# Patient Record
Sex: Male | Born: 1955 | Race: White | Hispanic: No | Marital: Married | Smoking: Never smoker
Health system: Southern US, Community
[De-identification: ages and names within clinical notes are randomized; demographics above are authoritative.]

## PROBLEM LIST (undated history)

## (undated) DIAGNOSIS — M109 Gout, unspecified: Secondary | ICD-10-CM

## (undated) HISTORY — PX: KNEE ARTHROSCOPY: SUR90

---

## 2007-03-16 ENCOUNTER — Ambulatory Visit (HOSPITAL_COMMUNITY): Admission: RE | Admit: 2007-03-16 | Discharge: 2007-03-16 | Payer: Self-pay | Admitting: Orthopedic Surgery

## 2015-10-25 ENCOUNTER — Encounter (HOSPITAL_BASED_OUTPATIENT_CLINIC_OR_DEPARTMENT_OTHER): Payer: Self-pay | Admitting: *Deleted

## 2015-10-25 ENCOUNTER — Emergency Department (HOSPITAL_BASED_OUTPATIENT_CLINIC_OR_DEPARTMENT_OTHER)
Admission: EM | Admit: 2015-10-25 | Discharge: 2015-10-25 | Disposition: A | Discharge status: Home / Self Care | Payer: Worker's Compensation | Attending: Emergency Medicine | Admitting: Emergency Medicine

## 2015-10-25 ENCOUNTER — Emergency Department (HOSPITAL_BASED_OUTPATIENT_CLINIC_OR_DEPARTMENT_OTHER): Payer: Worker's Compensation

## 2015-10-25 DIAGNOSIS — Y990 Civilian activity done for income or pay: Secondary | ICD-10-CM | POA: Insufficient documentation

## 2015-10-25 DIAGNOSIS — W010XXA Fall on same level from slipping, tripping and stumbling without subsequent striking against object, initial encounter: Secondary | ICD-10-CM | POA: Insufficient documentation

## 2015-10-25 DIAGNOSIS — S0990XA Unspecified injury of head, initial encounter: Secondary | ICD-10-CM | POA: Insufficient documentation

## 2015-10-25 DIAGNOSIS — S0081XA Abrasion of other part of head, initial encounter: Secondary | ICD-10-CM | POA: Insufficient documentation

## 2015-10-25 DIAGNOSIS — M109 Gout, unspecified: Secondary | ICD-10-CM | POA: Diagnosis not present

## 2015-10-25 DIAGNOSIS — Y9289 Other specified places as the place of occurrence of the external cause: Secondary | ICD-10-CM | POA: Diagnosis not present

## 2015-10-25 DIAGNOSIS — Y9389 Activity, other specified: Secondary | ICD-10-CM | POA: Diagnosis not present

## 2015-10-25 DIAGNOSIS — S0083XA Contusion of other part of head, initial encounter: Secondary | ICD-10-CM

## 2015-10-25 DIAGNOSIS — S0993XA Unspecified injury of face, initial encounter: Secondary | ICD-10-CM | POA: Diagnosis present

## 2015-10-25 DIAGNOSIS — S70311A Abrasion, right thigh, initial encounter: Secondary | ICD-10-CM | POA: Diagnosis not present

## 2015-10-25 DIAGNOSIS — T148XXA Other injury of unspecified body region, initial encounter: Secondary | ICD-10-CM

## 2015-10-25 HISTORY — DX: Gout, unspecified: M10.9

## 2015-10-25 NOTE — ED Notes (Signed)
Patient transported to CT 

## 2015-10-25 NOTE — Discharge Instructions (Signed)

## 2015-10-25 NOTE — ED Provider Notes (Signed)
CSN: [id number]     Arrival date & time 10/25/15  [id number] History   First MD Initiated Contact with Patient 10/25/15 0935     Chief Complaint  Patient presents with  . Fall     (Consider location/radiation/quality/duration/timing/severity/associated sxs/prior Treatment) HPI Comments: Patient presents with head and face pain after a fall. He states that he was at work and tripped on something that was on the ground, falling over onto his left face. He states there is no loss of consciousness. He does have a worsening headache. He has pain around his left eye. He denies any vision changes. He denies any neck or back pain. He also has an abrasion and swelling to his right thigh. He states he was seen at his occupational health facility where they cleaned and bandaged the abrasion on his right thigh and sent him over here for possible imaging studies. He denies any nausea or vomiting. He denies any dizziness. There is no chest pain or shortness of breath. He denies any other injuries. He is not taking anything for the pain and he denies any for any pain medication. He is not on anticoagulants.  Patient is a 60 y.o. male presenting with fall.  Fall Pertinent negatives include no chest pain, no abdominal pain, no headaches and no shortness of breath.    Past Medical History  Diagnosis Date  . Gout    Past Surgical History  Procedure Laterality Date  . Knee arthroscopy     History reviewed. No pertinent family history. Social History  Substance Use Topics  . Smoking status: Never Smoker   . Smokeless tobacco: None  . Alcohol Use: No    Review of Systems  Constitutional: Negative for fever, chills, diaphoresis and fatigue.  HENT: Positive for facial swelling. Negative for congestion, rhinorrhea and sneezing.   Eyes: Negative.   Respiratory: Negative for cough, chest tightness and shortness of breath.   Cardiovascular: Negative for chest pain and leg swelling.  Gastrointestinal: Negative  for nausea, vomiting, abdominal pain, diarrhea and blood in stool.  Genitourinary: Negative for frequency, hematuria, flank pain and difficulty urinating.  Musculoskeletal: Positive for myalgias. Negative for back pain, arthralgias and neck pain.  Skin: Positive for wound. Negative for rash.  Neurological: Negative for dizziness, speech difficulty, weakness, numbness and headaches.      Allergies  Review of patient's allergies indicates no known allergies.  Home Medications   Prior to Admission medications   Medication Sig Start Date End Date Taking? Authorizing Provider  allopurinol (ZYLOPRIM) 300 MG tablet Take 300 mg by mouth daily.   Yes Historical Provider, MD   BP 150/89 mmHg  Pulse 83  Temp(Src) 98.2 F (36.8 C) (Oral)  Resp 18  Ht 6' (1.829 m)  Wt 264 lb (119.75 kg)  BMI 35.80 kg/m2  SpO2 100% Physical Exam  Constitutional: He is oriented to person, place, and time. He appears well-developed and well-nourished.  HENT:  Head: Normocephalic.  Positive periorbital swelling around the left eye. There is tenderness in the supraorbital and infraorbital area. There is no redness or evident injuries to the eye itself. Pupils are equal round reactive to light. Extraocular eye movements are intact. There is ecchymosis in the supraorbital area. There is no hemotympanum. Teeth appear intact.  Eyes: Pupils are equal, round, and reactive to light.  Neck: Normal range of motion. Neck supple.  There is no pain along the cervical thoracic or lumbosacral spine  Cardiovascular: Normal rate, regular rhythm and normal heart sounds.  Pulmonary/Chest: Effort normal and breath sounds normal. No respiratory distress. He has no wheezes. He has no rales. He exhibits no tenderness.  Abdominal: Soft. Bowel sounds are normal. There is no tenderness. There is no rebound and no guarding.  Musculoskeletal: Normal range of motion. He exhibits no edema.  There is a contusion with overlying abrasion to  the right mid thigh on the lateral aspect. There is no bony tenderness. There is no pain on range of motion the hip or knee. He is neurovascularly intact distally. He denies any pain with ambulation. There is no other pain on palpation or range of motion of his extremities.  Lymphadenopathy:    He has no cervical adenopathy.  Neurological: He is alert and oriented to person, place, and time.  Skin: Skin is warm and dry. No rash noted.  Psychiatric: He has a normal mood and affect.    ED Course  Procedures (including critical care time) Labs Review Labs Reviewed - No data to display  Imaging Review Ct Head Wo Contrast  10/25/2015  CLINICAL DATA:  60 year old male who fell today striking face on concrete floor. Left eye bruising, headache. Initial encounter. EXAM: CT HEAD WITHOUT CONTRAST CT MAXILLOFACIAL WITHOUT CONTRAST TECHNIQUE: Multidetector CT imaging of the head and maxillofacial structures were performed using the standard protocol without intravenous contrast. Multiplanar CT image reconstructions of the maxillofacial structures were also generated. COMPARISON:  Orbit radiographs 03/16/2007. FINDINGS: CT HEAD FINDINGS Tympanic cavities and mastoids are clear. Calvarium intact. Superior scalp soft tissues are within normal limits, left periorbital soft tissues described below. No midline shift, ventriculomegaly, mass effect, evidence of mass lesion, intracranial hemorrhage or evidence of cortically based acute infarction. Gray-white matter differentiation is within normal limits throughout the brain. CT MAXILLOFACIAL FINDINGS Negative visualized non contrast pharynx (probable retained secretions in the right vallecula), parapharyngeal spaces, retropharyngeal space, sublingual space, submandibular glands, and parotid glands. Right orbits soft tissues are normal. 10 mm thick and broad-based left periorbital hematoma. The underlying left globe is intact. No left intraorbital contusion identified.  Left orbital walls are intact. Scattered bilateral mild paranasal sinus mucosal thickening. There does appear to be a small volume of layering blood in the left maxillary sinus, but no left maxillary fracture identified. Mandible intact. No zygoma or skullbase fracture identified. The anterior inferior nasal septum has been eroded. No nasal bone fracture identified. Visible cervical spine appears intact. IMPRESSION: 1. Superficial left periorbital hematoma. Trace layering blood in the left maxillary sinus, but no orbit, maxilla, or other acute facial fracture identified. 2. Negative noncontrast CT appearance of the brain. 3. 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 Electronically Signed   By: Genevie Ann M.D.   On: 10/25/2015 10:24   Ct Maxillofacial Wo Cm  10/25/2015  CLINICAL DATA:  60 year old male who fell today striking face on concrete floor. Left eye bruising, headache. Initial encounter. EXAM: CT HEAD WITHOUT CONTRAST CT MAXILLOFACIAL WITHOUT CONTRAST TECHNIQUE: Multidetector CT imaging of the head and maxillofacial structures were performed using the standard protocol without intravenous contrast. Multiplanar CT image reconstructions of the maxillofacial structures were also generated. COMPARISON:  Orbit radiographs 03/16/2007. FINDINGS: CT HEAD FINDINGS Tympanic cavities and mastoids are clear. Calvarium intact. Superior scalp soft tissues are within normal limits, left periorbital soft tissues described below. No midline shift, ventriculomegaly, mass effect, evidence of mass lesion, intracranial hemorrhage or evidence of cortically based acute infarction. Gray-white matter differentiation is within normal limits throughout the brain. CT MAXILLOFACIAL FINDINGS Negative visualized non contrast  pharynx (probable retained secretions in the right vallecula), parapharyngeal spaces, retropharyngeal space, sublingual space, submandibular glands, and parotid glands. Right orbits soft tissues are  normal. 10 mm thick and broad-based left periorbital hematoma. The underlying left globe is intact. No left intraorbital contusion identified. Left orbital walls are intact. Scattered bilateral mild paranasal sinus mucosal thickening. There does appear to be a small volume of layering blood in the left maxillary sinus, but no left maxillary fracture identified. Mandible intact. No zygoma or skullbase fracture identified. The anterior inferior nasal septum has been eroded. No nasal bone fracture identified. Visible cervical spine appears intact. IMPRESSION: 1. Superficial left periorbital hematoma. Trace layering blood in the left maxillary sinus, but no orbit, maxilla, or other acute facial fracture identified. 2. Negative noncontrast CT appearance of the brain. 3. 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 Electronically Signed   By: Genevie Ann M.D.   On: 10/25/2015 10:24   I have personally reviewed and evaluated these images and lab results as part of my medical decision-making.   EKG Interpretation None      MDM   Final diagnoses:  Facial contusion, initial encounter  Abrasion    Patient has evidence of a left facial contusion. There is no evidence of underlying fracture. No evidence of intracranial hemorrhage. He doesn't have any spinal tenderness. He doesn't have any evident eye injury. He denies any visual changes. His tetanus shot is up-to-date. He was discharged home in good condition. He was advised in symptomatic care. He was advised to follow-up with his PCP or return here as needed for any worsening symptoms.    Malvin Johns, MD 10/25/15 1034

## 2015-10-25 NOTE — ED Notes (Signed)
Pt c/o fall from standing x 1 hr ago, injuring left side of face , left thigh ,and l eft knee.

## 2015-10-25 NOTE — ED Notes (Signed)
MD at bedside. 

## 2015-12-06 DIAGNOSIS — Z1211 Encounter for screening for malignant neoplasm of colon: Secondary | ICD-10-CM | POA: Diagnosis not present

## 2015-12-06 DIAGNOSIS — Z1212 Encounter for screening for malignant neoplasm of rectum: Secondary | ICD-10-CM | POA: Diagnosis not present

## 2016-02-14 DIAGNOSIS — H15109 Unspecified episcleritis, unspecified eye: Secondary | ICD-10-CM | POA: Diagnosis not present

## 2016-02-14 DIAGNOSIS — Z1589 Genetic susceptibility to other disease: Secondary | ICD-10-CM | POA: Diagnosis not present

## 2016-02-14 DIAGNOSIS — H169 Unspecified keratitis: Secondary | ICD-10-CM | POA: Diagnosis not present

## 2016-10-25 DIAGNOSIS — Z125 Encounter for screening for malignant neoplasm of prostate: Secondary | ICD-10-CM | POA: Diagnosis not present

## 2016-10-25 DIAGNOSIS — Z0000 Encounter for general adult medical examination without abnormal findings: Secondary | ICD-10-CM | POA: Diagnosis not present

## 2016-10-31 DIAGNOSIS — Z1212 Encounter for screening for malignant neoplasm of rectum: Secondary | ICD-10-CM | POA: Diagnosis not present

## 2016-10-31 DIAGNOSIS — Z23 Encounter for immunization: Secondary | ICD-10-CM | POA: Diagnosis not present

## 2016-10-31 DIAGNOSIS — Z0001 Encounter for general adult medical examination with abnormal findings: Secondary | ICD-10-CM | POA: Diagnosis not present

## 2016-10-31 DIAGNOSIS — D485 Neoplasm of uncertain behavior of skin: Secondary | ICD-10-CM | POA: Diagnosis not present

## 2016-10-31 DIAGNOSIS — J309 Allergic rhinitis, unspecified: Secondary | ICD-10-CM | POA: Diagnosis not present

## 2016-10-31 DIAGNOSIS — M109 Gout, unspecified: Secondary | ICD-10-CM | POA: Diagnosis not present

## 2016-10-31 DIAGNOSIS — Z833 Family history of diabetes mellitus: Secondary | ICD-10-CM | POA: Diagnosis not present

## 2016-11-15 DIAGNOSIS — L820 Inflamed seborrheic keratosis: Secondary | ICD-10-CM | POA: Diagnosis not present

## 2016-11-15 DIAGNOSIS — I781 Nevus, non-neoplastic: Secondary | ICD-10-CM | POA: Diagnosis not present

## 2017-02-27 DIAGNOSIS — H01024 Squamous blepharitis left upper eyelid: Secondary | ICD-10-CM | POA: Diagnosis not present

## 2017-02-27 DIAGNOSIS — H01022 Squamous blepharitis right lower eyelid: Secondary | ICD-10-CM | POA: Diagnosis not present

## 2017-02-27 DIAGNOSIS — H01021 Squamous blepharitis right upper eyelid: Secondary | ICD-10-CM | POA: Diagnosis not present

## 2017-02-27 DIAGNOSIS — H01025 Squamous blepharitis left lower eyelid: Secondary | ICD-10-CM | POA: Diagnosis not present

## 2017-04-24 DIAGNOSIS — H18892 Other specified disorders of cornea, left eye: Secondary | ICD-10-CM | POA: Diagnosis not present

## 2017-09-16 DIAGNOSIS — H01024 Squamous blepharitis left upper eyelid: Secondary | ICD-10-CM | POA: Diagnosis not present

## 2017-09-16 DIAGNOSIS — H01022 Squamous blepharitis right lower eyelid: Secondary | ICD-10-CM | POA: Diagnosis not present

## 2017-09-16 DIAGNOSIS — H01021 Squamous blepharitis right upper eyelid: Secondary | ICD-10-CM | POA: Diagnosis not present

## 2017-09-16 DIAGNOSIS — H01025 Squamous blepharitis left lower eyelid: Secondary | ICD-10-CM | POA: Diagnosis not present

## 2017-11-11 DIAGNOSIS — Z0000 Encounter for general adult medical examination without abnormal findings: Secondary | ICD-10-CM | POA: Diagnosis not present

## 2017-11-11 DIAGNOSIS — Z125 Encounter for screening for malignant neoplasm of prostate: Secondary | ICD-10-CM | POA: Diagnosis not present

## 2017-11-13 DIAGNOSIS — R0989 Other specified symptoms and signs involving the circulatory and respiratory systems: Secondary | ICD-10-CM | POA: Diagnosis not present

## 2017-11-13 DIAGNOSIS — J309 Allergic rhinitis, unspecified: Secondary | ICD-10-CM | POA: Diagnosis not present

## 2017-11-13 DIAGNOSIS — M109 Gout, unspecified: Secondary | ICD-10-CM | POA: Diagnosis not present

## 2017-11-13 DIAGNOSIS — Z8781 Personal history of (healed) traumatic fracture: Secondary | ICD-10-CM | POA: Diagnosis not present

## 2017-11-13 DIAGNOSIS — Z0001 Encounter for general adult medical examination with abnormal findings: Secondary | ICD-10-CM | POA: Diagnosis not present

## 2017-11-13 DIAGNOSIS — Z1212 Encounter for screening for malignant neoplasm of rectum: Secondary | ICD-10-CM | POA: Diagnosis not present

## 2017-11-15 ENCOUNTER — Other Ambulatory Visit: Payer: Self-pay | Admitting: Internal Medicine

## 2017-11-22 ENCOUNTER — Other Ambulatory Visit: Payer: Self-pay | Admitting: Internal Medicine

## 2017-11-27 ENCOUNTER — Other Ambulatory Visit: Payer: Self-pay | Admitting: Internal Medicine

## 2017-11-29 ENCOUNTER — Other Ambulatory Visit: Payer: Self-pay | Admitting: Rheumatology

## 2017-11-29 DIAGNOSIS — E7800 Pure hypercholesterolemia, unspecified: Secondary | ICD-10-CM

## 2017-12-26 ENCOUNTER — Ambulatory Visit: Payer: Self-pay | Admitting: Cardiology

## 2018-01-09 ENCOUNTER — Ambulatory Visit: Payer: Self-pay | Admitting: Cardiology

## 2018-01-14 DIAGNOSIS — Z6841 Body Mass Index (BMI) 40.0 and over, adult: Secondary | ICD-10-CM | POA: Diagnosis not present

## 2018-02-10 DIAGNOSIS — M25562 Pain in left knee: Secondary | ICD-10-CM | POA: Diagnosis not present

## 2018-02-10 DIAGNOSIS — M1711 Unilateral primary osteoarthritis, right knee: Secondary | ICD-10-CM | POA: Diagnosis not present

## 2018-02-10 DIAGNOSIS — M1712 Unilateral primary osteoarthritis, left knee: Secondary | ICD-10-CM | POA: Diagnosis not present

## 2018-03-18 DIAGNOSIS — M1712 Unilateral primary osteoarthritis, left knee: Secondary | ICD-10-CM | POA: Diagnosis not present

## 2018-03-26 DIAGNOSIS — M1712 Unilateral primary osteoarthritis, left knee: Secondary | ICD-10-CM | POA: Diagnosis not present

## 2018-03-31 DIAGNOSIS — M1712 Unilateral primary osteoarthritis, left knee: Secondary | ICD-10-CM | POA: Diagnosis not present

## 2018-05-12 DIAGNOSIS — M1712 Unilateral primary osteoarthritis, left knee: Secondary | ICD-10-CM | POA: Diagnosis not present

## 2018-06-23 DIAGNOSIS — M1712 Unilateral primary osteoarthritis, left knee: Secondary | ICD-10-CM | POA: Diagnosis not present

## 2018-09-01 DIAGNOSIS — M25562 Pain in left knee: Secondary | ICD-10-CM | POA: Diagnosis not present

## 2018-09-01 DIAGNOSIS — M1712 Unilateral primary osteoarthritis, left knee: Secondary | ICD-10-CM | POA: Diagnosis not present

## 2018-09-05 DIAGNOSIS — M25562 Pain in left knee: Secondary | ICD-10-CM | POA: Diagnosis not present

## 2018-09-16 DIAGNOSIS — M25562 Pain in left knee: Secondary | ICD-10-CM | POA: Diagnosis not present

## 2018-09-29 DIAGNOSIS — H5213 Myopia, bilateral: Secondary | ICD-10-CM | POA: Diagnosis not present

## 2018-09-29 DIAGNOSIS — H52212 Irregular astigmatism, left eye: Secondary | ICD-10-CM | POA: Diagnosis not present

## 2018-09-29 DIAGNOSIS — H2513 Age-related nuclear cataract, bilateral: Secondary | ICD-10-CM | POA: Diagnosis not present

## 2018-09-29 DIAGNOSIS — H01003 Unspecified blepharitis right eye, unspecified eyelid: Secondary | ICD-10-CM | POA: Diagnosis not present

## 2019-03-30 DIAGNOSIS — H52212 Irregular astigmatism, left eye: Secondary | ICD-10-CM | POA: Diagnosis not present

## 2019-03-30 DIAGNOSIS — H179 Unspecified corneal scar and opacity: Secondary | ICD-10-CM | POA: Diagnosis not present

## 2019-03-30 DIAGNOSIS — H1840 Unspecified corneal degeneration: Secondary | ICD-10-CM | POA: Diagnosis not present

## 2019-03-30 DIAGNOSIS — H5213 Myopia, bilateral: Secondary | ICD-10-CM | POA: Diagnosis not present

## 2019-03-30 DIAGNOSIS — H16422 Pannus (corneal), left eye: Secondary | ICD-10-CM | POA: Diagnosis not present

## 2019-04-06 DIAGNOSIS — Z0000 Encounter for general adult medical examination without abnormal findings: Secondary | ICD-10-CM | POA: Diagnosis not present

## 2019-04-06 DIAGNOSIS — R7303 Prediabetes: Secondary | ICD-10-CM | POA: Diagnosis not present

## 2019-04-06 DIAGNOSIS — Z125 Encounter for screening for malignant neoplasm of prostate: Secondary | ICD-10-CM | POA: Diagnosis not present

## 2019-04-06 DIAGNOSIS — M109 Gout, unspecified: Secondary | ICD-10-CM | POA: Diagnosis not present

## 2019-07-05 HISTORY — DX: Other disorders of bilirubin metabolism: E80.6

## 2019-07-23 ENCOUNTER — Inpatient Hospital Stay (HOSPITAL_COMMUNITY)
Admission: EM | Admit: 2019-07-23 | Discharge: 2019-07-29 | DRG: 374 | Disposition: A | Discharge status: Home / Self Care | Payer: BC Managed Care – PPO | Attending: Internal Medicine | Admitting: Internal Medicine

## 2019-07-23 ENCOUNTER — Inpatient Hospital Stay (HOSPITAL_COMMUNITY): Payer: BC Managed Care – PPO

## 2019-07-23 ENCOUNTER — Encounter (HOSPITAL_COMMUNITY): Payer: Self-pay | Admitting: Radiology

## 2019-07-23 ENCOUNTER — Emergency Department (HOSPITAL_COMMUNITY): Payer: BC Managed Care – PPO

## 2019-07-23 DIAGNOSIS — C787 Secondary malignant neoplasm of liver and intrahepatic bile duct: Secondary | ICD-10-CM

## 2019-07-23 DIAGNOSIS — R1010 Upper abdominal pain, unspecified: Secondary | ICD-10-CM | POA: Diagnosis not present

## 2019-07-23 DIAGNOSIS — K8689 Other specified diseases of pancreas: Secondary | ICD-10-CM | POA: Diagnosis not present

## 2019-07-23 DIAGNOSIS — R160 Hepatomegaly, not elsewhere classified: Secondary | ICD-10-CM | POA: Diagnosis not present

## 2019-07-23 DIAGNOSIS — R1011 Right upper quadrant pain: Secondary | ICD-10-CM | POA: Diagnosis not present

## 2019-07-23 DIAGNOSIS — E669 Obesity, unspecified: Secondary | ICD-10-CM | POA: Diagnosis not present

## 2019-07-23 DIAGNOSIS — K3189 Other diseases of stomach and duodenum: Secondary | ICD-10-CM | POA: Diagnosis not present

## 2019-07-23 DIAGNOSIS — R933 Abnormal findings on diagnostic imaging of other parts of digestive tract: Secondary | ICD-10-CM | POA: Diagnosis not present

## 2019-07-23 DIAGNOSIS — R7989 Other specified abnormal findings of blood chemistry: Secondary | ICD-10-CM

## 2019-07-23 DIAGNOSIS — Z833 Family history of diabetes mellitus: Secondary | ICD-10-CM | POA: Diagnosis not present

## 2019-07-23 DIAGNOSIS — C7989 Secondary malignant neoplasm of other specified sites: Secondary | ICD-10-CM | POA: Diagnosis not present

## 2019-07-23 DIAGNOSIS — R17 Unspecified jaundice: Secondary | ICD-10-CM | POA: Diagnosis not present

## 2019-07-23 DIAGNOSIS — R748 Abnormal levels of other serum enzymes: Secondary | ICD-10-CM | POA: Diagnosis not present

## 2019-07-23 DIAGNOSIS — C170 Malignant neoplasm of duodenum: Secondary | ICD-10-CM | POA: Diagnosis not present

## 2019-07-23 DIAGNOSIS — C259 Malignant neoplasm of pancreas, unspecified: Secondary | ICD-10-CM

## 2019-07-23 DIAGNOSIS — Z20828 Contact with and (suspected) exposure to other viral communicable diseases: Secondary | ICD-10-CM | POA: Diagnosis present

## 2019-07-23 DIAGNOSIS — E222 Syndrome of inappropriate secretion of antidiuretic hormone: Secondary | ICD-10-CM | POA: Diagnosis present

## 2019-07-23 DIAGNOSIS — Z79899 Other long term (current) drug therapy: Secondary | ICD-10-CM

## 2019-07-23 DIAGNOSIS — K831 Obstruction of bile duct: Secondary | ICD-10-CM

## 2019-07-23 DIAGNOSIS — K649 Unspecified hemorrhoids: Secondary | ICD-10-CM | POA: Diagnosis present

## 2019-07-23 DIAGNOSIS — K769 Liver disease, unspecified: Secondary | ICD-10-CM | POA: Diagnosis not present

## 2019-07-23 DIAGNOSIS — R634 Abnormal weight loss: Secondary | ICD-10-CM

## 2019-07-23 DIAGNOSIS — K838 Other specified diseases of biliary tract: Secondary | ICD-10-CM | POA: Diagnosis not present

## 2019-07-23 DIAGNOSIS — G4700 Insomnia, unspecified: Secondary | ICD-10-CM | POA: Diagnosis present

## 2019-07-23 DIAGNOSIS — R109 Unspecified abdominal pain: Secondary | ICD-10-CM | POA: Diagnosis not present

## 2019-07-23 DIAGNOSIS — E871 Hypo-osmolality and hyponatremia: Secondary | ICD-10-CM

## 2019-07-23 DIAGNOSIS — R918 Other nonspecific abnormal finding of lung field: Secondary | ICD-10-CM | POA: Diagnosis not present

## 2019-07-23 DIAGNOSIS — Z8739 Personal history of other diseases of the musculoskeletal system and connective tissue: Secondary | ICD-10-CM

## 2019-07-23 DIAGNOSIS — Z6835 Body mass index (BMI) 35.0-35.9, adult: Secondary | ICD-10-CM | POA: Diagnosis not present

## 2019-07-23 DIAGNOSIS — R7303 Prediabetes: Secondary | ICD-10-CM | POA: Diagnosis not present

## 2019-07-23 DIAGNOSIS — M109 Gout, unspecified: Secondary | ICD-10-CM | POA: Diagnosis not present

## 2019-07-23 DIAGNOSIS — C801 Malignant (primary) neoplasm, unspecified: Secondary | ICD-10-CM | POA: Diagnosis not present

## 2019-07-23 DIAGNOSIS — C772 Secondary and unspecified malignant neoplasm of intra-abdominal lymph nodes: Secondary | ICD-10-CM | POA: Diagnosis not present

## 2019-07-23 DIAGNOSIS — C250 Malignant neoplasm of head of pancreas: Secondary | ICD-10-CM | POA: Diagnosis not present

## 2019-07-23 LAB — CBC WITH DIFFERENTIAL/PLATELET
Abs Immature Granulocytes: 0.02 K/uL (ref 0.00–0.07)
Basophils Absolute: 0.1 K/uL (ref 0.0–0.1)
Basophils Relative: 1 %
Eosinophils Absolute: 0.1 K/uL (ref 0.0–0.5)
Eosinophils Relative: 2 %
HCT: 39 % (ref 39.0–52.0)
Hemoglobin: 13.7 g/dL (ref 13.0–17.0)
Immature Granulocytes: 0 %
Lymphocytes Relative: 23 %
Lymphs Abs: 1.1 K/uL (ref 0.7–4.0)
MCH: 34.2 pg — ABNORMAL HIGH (ref 26.0–34.0)
MCHC: 35.1 g/dL (ref 30.0–36.0)
MCV: 97.3 fL (ref 80.0–100.0)
Monocytes Absolute: 0.4 K/uL (ref 0.1–1.0)
Monocytes Relative: 9 %
Neutro Abs: 3 K/uL (ref 1.7–7.7)
Neutrophils Relative %: 65 %
Platelets: 228 K/uL (ref 150–400)
RBC: 4.01 MIL/uL — ABNORMAL LOW (ref 4.22–5.81)
RDW: 12.1 % (ref 11.5–15.5)
WBC: 4.6 K/uL (ref 4.0–10.5)
nRBC: 0 % (ref 0.0–0.2)

## 2019-07-23 LAB — COMPREHENSIVE METABOLIC PANEL
ALT: 164 U/L — ABNORMAL HIGH (ref 0–44)
AST: 114 U/L — ABNORMAL HIGH (ref 15–41)
Albumin: 3.9 g/dL (ref 3.5–5.0)
Alkaline Phosphatase: 300 U/L — ABNORMAL HIGH (ref 38–126)
Anion gap: 10 (ref 5–15)
BUN: 7 mg/dL (ref 6–20)
CO2: 25 mmol/L (ref 22–32)
Calcium: 9.3 mg/dL (ref 8.9–10.3)
Chloride: 99 mmol/L (ref 98–111)
Creatinine, Ser: 0.72 mg/dL (ref 0.61–1.24)
GFR calc Af Amer: >60 mL/min (ref >60)
GFR calc non Af Amer: >60 mL/min (ref >60)
Glucose, Bld: 125 mg/dL — ABNORMAL HIGH (ref 70–99)
Potassium: 3.8 mmol/L (ref 3.5–5.1)
Sodium: 134 mmol/L — ABNORMAL LOW (ref 135–145)
Total Bilirubin: 13.6 mg/dL — ABNORMAL HIGH (ref 0.3–1.2)
Total Protein: 7.2 g/dL (ref 6.5–8.1)

## 2019-07-23 LAB — SEDIMENTATION RATE: Sed Rate: 20 mm/hr — ABNORMAL HIGH (ref 0–16)

## 2019-07-23 LAB — PROTIME-INR
INR: 1.1 (ref 0.8–1.2)
Prothrombin Time: 14.4 seconds (ref 11.4–15.2)

## 2019-07-23 LAB — LIPASE, BLOOD: Lipase: 297 U/L — ABNORMAL HIGH (ref 11–51)

## 2019-07-23 LAB — C-REACTIVE PROTEIN: CRP: 4.4 mg/dL — ABNORMAL HIGH (ref <1.0)

## 2019-07-23 LAB — HIV ANTIBODY (ROUTINE TESTING W REFLEX): HIV Screen 4th Generation wRfx: NONREACTIVE

## 2019-07-23 MED ORDER — ALLOPURINOL 300 MG PO TABS
300.0000 mg | ORAL_TABLET | Freq: Every day | ORAL | Status: DC
  Administered 2019-07-23 – 2019-07-28 (×6): 300 mg via ORAL
  Filled 2019-07-23 (×8): qty 1

## 2019-07-23 MED ORDER — ONDANSETRON HCL 4 MG PO TABS: 4.0000 mg | ORAL_TABLET | Freq: Four times a day (QID) | ORAL | Status: DC | PRN

## 2019-07-23 MED ORDER — ALUM & MAG HYDROXIDE-SIMETH 200-200-20 MG/5ML PO SUSP
30.0000 mL | ORAL | Status: DC | PRN
  Administered 2019-07-23: 30 mL via ORAL
  Filled 2019-07-23: qty 30

## 2019-07-23 MED ORDER — IOHEXOL 300 MG/ML  SOLN
100.0000 mL | Freq: Once | INTRAMUSCULAR | Status: AC | PRN
  Administered 2019-07-23: 100 mL via INTRAVENOUS

## 2019-07-23 MED ORDER — ENOXAPARIN SODIUM 40 MG/0.4ML ~~LOC~~ SOLN: 40.0000 mg | SUBCUTANEOUS | Status: DC

## 2019-07-23 MED ORDER — ONDANSETRON HCL 4 MG/2ML IJ SOLN: 4.0000 mg | Freq: Four times a day (QID) | INTRAMUSCULAR | Status: DC | PRN

## 2019-07-23 MED ORDER — ALBUTEROL SULFATE (2.5 MG/3ML) 0.083% IN NEBU: 2.5000 mg | INHALATION_SOLUTION | Freq: Four times a day (QID) | RESPIRATORY_TRACT | Status: DC | PRN

## 2019-07-23 MED ORDER — SODIUM CHLORIDE 0.9% FLUSH
3.0000 mL | Freq: Two times a day (BID) | INTRAVENOUS | Status: DC
  Administered 2019-07-23 – 2019-07-27 (×6): 3 mL via INTRAVENOUS

## 2019-07-23 MED ORDER — ZOLPIDEM TARTRATE 5 MG PO TABS
5.0000 mg | ORAL_TABLET | Freq: Every evening | ORAL | Status: DC | PRN
  Administered 2019-07-23 – 2019-07-28 (×6): 5 mg via ORAL
  Filled 2019-07-23 (×6): qty 1

## 2019-07-23 MED ORDER — SODIUM CHLORIDE 0.9 % IV SOLN
INTRAVENOUS | Status: DC
  Administered 2019-07-23 – 2019-07-29 (×7): via INTRAVENOUS

## 2019-07-23 NOTE — Plan of Care (Signed)
  Problem: Pain Managment: Goal: General experience of comfort will improve Outcome: Progressing   Problem: Safety: Goal: Ability to remain free from injury will improve Outcome: Progressing   Problem: Skin Integrity: Goal: Risk for impaired skin integrity will decrease Outcome: Progressing   

## 2019-07-23 NOTE — ED Provider Notes (Signed)
[facility] EMERGENCY DEPARTMENT Provider Note   CSN: [id number] Arrival date & time: 07/23/19  1024     History   Chief Complaint Chief Complaint  Patient presents with  . Jaundice  . Abnormal labs    HPI Jeffrey Harris is a 63 y.o. male.     63 y.o male with a PMH of Gout presents to the ED with a chief complaint of upper abdominal discomfort x 1 week.  Patient reports a feeling of fullness for the past 3 weeks around his upper abdomen, he reports he noted that his skin along with his eyes were turning yellow therefore he seek an appointment with his PCP today.  Patient was seen at PCPs office today and was found to have LFTs remarkable for an AST of 112, and ALT of 215 and a total bili of 12.3. He reports no nausea, vomiting, fevers, no changes in appetite.  He does report some intentional weight loss, he was told he was borderline diabetic here.  He has not had any surgeries to his abdominal region.  Last bowel movement prior to arrival.  He does report drinking alcohol however only does so occasionally.He does report he has been taking Tylenol PM to help him sleep for the past 2 months, has now switched to melatonin.  He does not have any prior history of hepatitis.  No fevers, diarrhea or recent travel.   The history is provided by the patient.    Past Medical History:  Diagnosis Date  . Gout     There are no active problems to display for this patient.   Past Surgical History:  Procedure Laterality Date  . KNEE ARTHROSCOPY          Home Medications    Prior to Admission medications   Medication Sig Start Date End Date Taking? Authorizing Provider  allopurinol (ZYLOPRIM) 300 MG tablet Take 300 mg by mouth daily.    [provider]    Family History No family history on file.  Social History Social History   Tobacco Use  . Smoking status: Never Smoker  Substance Use Topics  . Alcohol use: No  . Drug use: No     Allergies    Patient has no known allergies.   Review of Systems Review of Systems  Constitutional: Negative for chills and fever.  HENT: Negative for ear pain and sore throat.   Eyes: Negative for pain and visual disturbance.  Respiratory: Negative for cough and shortness of breath.   Cardiovascular: Negative for chest pain and palpitations.  Gastrointestinal: Positive for abdominal distention and abdominal pain. Negative for blood in stool, nausea and vomiting.  Genitourinary: Negative for dysuria and hematuria.  Musculoskeletal: Negative for arthralgias and back pain.  Skin: Negative for color change and rash.  Neurological: Negative for seizures and syncope.  All other systems reviewed and are negative.    Physical Exam Updated Vital Signs BP (!) 143/81   Pulse 69   Temp 97.9 F (36.6 C) (Oral)   Resp 12   SpO2 100%   Physical Exam Vitals signs and nursing note reviewed.  Constitutional:      Appearance: He is well-developed.  HENT:     Head: Normocephalic and atraumatic.  Eyes:     General: Scleral icterus present.     Pupils: Pupils are equal, round, and reactive to light.  Neck:     Musculoskeletal: Normal range of motion.  Cardiovascular:     Rate and  Rhythm: Normal rate.     Heart sounds: Normal heart sounds.  Pulmonary:     Effort: Pulmonary effort is normal.     Breath sounds: Normal breath sounds. No wheezing.     Comments: Lung's are clear to auscultation without any wheezing or rales. Chest:     Chest wall: No tenderness.  Abdominal:     General: Bowel sounds are normal. There is no distension.     Palpations: Abdomen is soft.     Tenderness: There is no abdominal tenderness.     Comments: Abdomen is soft, nontender.  Mild distention, no hepatomegaly on my exam.  Musculoskeletal:        General: No tenderness or deformity.  Skin:    General: Skin is warm and dry.     Coloration: Skin is jaundiced.  Neurological:     Mental Status: He is alert and oriented  to person, place, and time.      ED Treatments / Results  Labs (all labs ordered are listed, but only abnormal results are displayed) Labs Reviewed  CBC WITH DIFFERENTIAL/PLATELET - Abnormal; Notable for the following components:      Result Value   RBC 4.01 (*)    MCH 34.2 (*)    All other components within normal limits  COMPREHENSIVE METABOLIC PANEL - Abnormal; Notable for the following components:   Sodium 134 (*)    Glucose, Bld 125 (*)    AST 114 (*)    ALT 164 (*)    Alkaline Phosphatase 300 (*)    Total Bilirubin 13.6 (*)    All other components within normal limits  LIPASE, BLOOD - Abnormal; Notable for the following components:   Lipase 297 (*)    All other components within normal limits  PROTIME-INR    EKG None  Radiology US Abdomen Limited Ruq  Result Date: 07/23/2019 CLINICAL DATA:  Abdominal discomfort and jaundice EXAM: ULTRASOUND ABDOMEN LIMITED RIGHT UPPER QUADRANT COMPARISON:  None. FINDINGS: Gallbladder: No gallstones or wall thickening visualized. No sonographic Murphy sign noted by sonographer. Common bile duct: Diameter: 9 mm Liver: Heterogeneous echotexture. Ill-defined 2.1 x 2.1 x 1.5 cm hypoechoic area in the right lobe and 1.7 x 1.6 x 1.5 cm hypoechoic area in the left lobe. Portal vein is patent on color Doppler imaging with normal direction of blood flow towards the liver. Other: None. IMPRESSION: Dilatation of the common bile duct. Heterogeneous appearance of the liver with two nonspecific ill-defined hypoechoic areas. No evidence of acute cholecystitis. Electronically Signed   By: Macy Mis M.D.   On: 07/23/2019 11:23    Procedures Procedures (including critical care time)  Medications Ordered in ED Medications - No data to display   Initial Impression / Assessment and Plan / ED Course  I have reviewed the triage vital signs and the nursing notes.  Pertinent labs & imaging results that were available during my care of the patient  were reviewed by me and considered in my medical decision making (see chart for details).       Patient with no past medical history presents to the ED with complaints of abdominal distention, he was seen by PCP this morning due to having uncomfortable feeling to his upper abdominal region.  PCPs office was remarkable for laboratory results which showed an AST of 112, and ALT of 215, a total bili of 12.30.  UA also revealed some bilirubin present on it.  He reports no recent travel, fevers or prior history of hepatitis.  He does drink occasionally, reports he has been in Tylenol PM for a month to help him with his sleep, recently switched to melatonin.  No prior surgical history to his abdomen.  No nausea, vomiting.  An ultrasound of his upper quadrant was obtained which showed dilation of the common bile duct, there was a presence of 2 hypoechoic lesions on the left and right lobe of the liver.  Labs will be recollected in hospital, I will also place a call for gastroenterology for further recommendations.  No signs of infection such as fevers, nausea, vomiting, blood in his stool.  Lower suspicion for infection process.  12:35 PM Spoke to [place] who will see patient in the ED for further recommendations.  1:31 PM oratory results were remarkable for a CBC without any leukocytosis, hemoglobin is unremarkable.  CMP remarkable for elevated LFTs, AST is 114, ALT is 164, total bili is 13.6, PT and INR within normal limits.  Lipase is elevated at 297.  Patient was evaluated by GI, plan is for him to go and have MRCP versus ERCP.  He will need admission into hospitalist service.  No further recommendations were given from [facility].  1:48 PM Spoke to Dr. Tamala Julian, who will admit patient for further management.  Portions of this note were generated with [employer]. Dictation errors may occur despite best attempts at proofreading.  Final Clinical Impressions(s) / ED Diagnoses   Final  diagnoses:  Jaundice  Elevated LFTs  Pain of upper abdomen    ED Discharge Orders    None       Janeece Fitting, PA-C 07/23/19 [place], DO 07/23/19 1355

## 2019-07-23 NOTE — Consult Note (Addendum)
[facility] Consult: 12:33 PM 07/23/2019  LOS: 0 days    Referring Provider: Dr Tyrone Nine  Primary Care Physician:  [name], MD Primary Gastroenterologist:  unassigned     Reason for Consultation: Jaundice, upper abdominal fullness.  Abnormal abdominal ultrasound   HPI: Jeffrey Harris is a 63 y.o. male.  Hx gout.   Negative Cologuard in 2017.  3 weeks of discomfort in the upper abdomen/epigastric region.  Additionally having altered bowel habits fluctuating between loose and formed.  No pale or bloody stools.  Previously had formed brown stools daily.  Pain is not severe per his assessment but it also interrupts his ability to sleep.  This is not associated with anorexia, nausea, vomiting.  He has dropped weight over 18 months is gone from 270 # to 236 #but this is intentional he has been watching what he eats and trying to lose weight.  Yesterday he noticed that the whites of his eyes had turned yellow.  He saw PMD today who obtained labs, listed below, and was sent to the ED for further evaluation of jaundice.   T bili 12.3.  Alkaline phosphatase 366.  AST/ALT 112/215. PT 14.4, INR 1.1.  Glucose 125. Ultrasound abdomen: No GB stones, sludge, wall thickening.  9 mm CBD.  Liver heterogeneous with 2.1 x 2.1 x 1.5 cm hyperechoic area in the right lobe, 1.7 x 1.6 x 1.5 cm hyperechoic echoic area left lobe.  Portal vein flow normal.  Patient was taking a Tylenol PM before sleep but recently switched to melatonin.  Has not had any alcoholic beverages for at least 6 weeks, he can consume a sixpack on weekends when he is hanging out at the pool.  Does not always or necessarily drink during the week.  Previously employed as a Production assistant, radio at [employer] but lost his job due to [employer].  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 No family history of pancreatic disease, colorectal cancer.  His mother had her gallbladder removed.    Past Medical History:  Diagnosis Date  . Gout     Past Surgical History:  Procedure Laterality Date  . KNEE ARTHROSCOPY      Prior to Admission medications   Medication Sig Start Date End Date Taking? Authorizing Provider  allopurinol (ZYLOPRIM) 300 MG tablet Take 300 mg by mouth daily.    [provider]    Scheduled Meds:  Infusions:  PRN Meds:    Allergies as of 07/23/2019  . (No Known Allergies)    No family history on file.  Social History   Socioeconomic History  . Marital status: Married    Spouse name: Not on file  . Number of children: Not on file  . Years of education: Not on file  . Highest education level: Not on file  Occupational History  . Not on file  Social Needs  . Financial resource strain: Not on file  . Food insecurity    Worry: Not on file  Inability: Not on file  . Transportation needs    Medical: Not on file    Non-medical: Not on file  Tobacco Use  . Smoking status: Never Smoker  Substance and Sexual Activity  . Alcohol use: No  . Drug use: No  . Sexual activity: Never  Lifestyle  . Physical activity    Days per week: Not on file    Minutes per session: Not on file  . Stress: Not on file  Relationships  . Social Herbalist on phone: Not on file    Gets together: Not on file    Attends religious service: Not on file    Active member of club or organization: Not on file    Attends meetings of clubs or organizations: Not on file    Relationship status: Not on file  . Intimate partner violence    Fear of current or ex partner: Not on file    Emotionally abused: Not on file    Physically abused: Not on file    Forced sexual activity: Not on file  Other Topics Concern  . Not on file  Social History Narrative  .  Not on file    REVIEW OF SYSTEMS: Constitutional: No weakness, no fatigue ENT:  No nose bleeds Pulm: No shortness of breath, no cough CV:  No palpitations, no LE edema.  No angina GU:  No hematuria, no frequency GI: See HPI Heme: Unusual or excessive bleeding or bruising. Transfusions: Never transfused in the past. Neuro:  No headaches, no peripheral tingling or numbness Derm:  No itching, no rash or sores.  Endocrine:  No sweats or chills.  No polyuria or dysuria Immunization: Not queried. Travel:  None beyond local counties in last few months.    PHYSICAL EXAM: Vital signs in last 24 hours: Vitals:   07/23/19 1220 07/23/19 1225  BP:    Pulse: 74 75  Resp: 11 12  Temp:    SpO2: 100% 98%   Wt Readings from Last 3 Encounters:  10/25/15 119.7 kg    General: Cooperative, pleasant, other than scleral icterus, he looks well. Head: No facial asymmetry or swelling.  No signs of head trauma. Eyes: Icteric sclera which is mild. Ears: Not hard of hearing Nose: No discharge or congestion Mouth: Moist, clear, pink mucosa.  Tongue midline.  Excellent dental health. Neck: No JVD, no masses, no thyromegaly. Lungs: No labored breathing, no cough.  Lungs clear to auscultation bilaterally. Heart: RRR.  No MRG.  S1, S2 present. Abdomen: Soft.  Moderate tenderness in the upper abdomen at the epigastrium and right upper quadrant fullness in the right upper quadrant without discrete liver edge.  Active bowel sounds.  No hernias, bruits. Rectal: Deferred Musc/Skeltl: No joint swelling, redness, gross deformity. Extremities: No CCE. Neurologic: Oriented x3.  Fully alert.  Excellent historian.  Moves all 4 limbs.  No limb weakness, no tremors. Skin: Patient has a lingering suntanned so it is hard to appreciate jaundice. Nodes: No cervical adenopathy. Psych: Cooperative, pleasant, calm.  Fluid speech.  Intake/Output from previous day: No intake/output data recorded. Intake/Output this  shift: No intake/output data recorded.  LAB RESULTS: No results for input(s): WBC, HGB, HCT, PLT in the last 72 hours. BMET No results found for: NA, K, CL, CO2, GLUCOSE, BUN, CREATININE, CALCIUM LFT No results for input(s): PROT, ALBUMIN, AST, ALT, ALKPHOS, BILITOT, BILIDIR, IBILI in the last 72 hours. PT/INR Lab Results  Component Value Date   INR 1.1 07/23/2019  Hepatitis Panel No results for input(s): HEPBSAG, HCVAB, HEPAIGM, HEPBIGM in the last 72 hours. C-Diff No components found for: CDIFF Lipase  No results found for: LIPASE  Drugs of Abuse  No results found for: LABOPIA, COCAINSCRNUR, LABBENZ, AMPHETMU, THCU, LABBARB   RADIOLOGY STUDIES: US Abdomen Limited Ruq  Result Date: 07/23/2019 CLINICAL DATA:  Abdominal discomfort and jaundice EXAM: ULTRASOUND ABDOMEN LIMITED RIGHT UPPER QUADRANT COMPARISON:  None. FINDINGS: Gallbladder: No gallstones or wall thickening visualized. No sonographic Murphy sign noted by sonographer. Common bile duct: Diameter: 9 mm Liver: Heterogeneous echotexture. Ill-defined 2.1 x 2.1 x 1.5 cm hypoechoic area in the right lobe and 1.7 x 1.6 x 1.5 cm hypoechoic area in the left lobe. Portal vein is patent on color Doppler imaging with normal direction of blood flow towards the liver. Other: None. IMPRESSION: Dilatation of the common bile duct. Heterogeneous appearance of the liver with two nonspecific ill-defined hypoechoic areas. No evidence of acute cholecystitis. Electronically Signed   By: Macy Mis M.D.   On: 07/23/2019 11:23      IMPRESSION:   *   New onset jaundice and right upper quadrant fullness. Dilated CBD at 9 mm and 2 indeterminate lesions in heterogeneous liver.     PLAN:     *   MRCP vs ERCP Will discuss with Dr. Loletha Carrow.  *   PT/INR.   Azucena Freed  07/23/2019, 12:33 PM Phone [phone]  I have reviewed the entire case in detail with the above APP and discussed the plan in detail.  Therefore, I agree with the  diagnoses recorded above. In addition,  I have personally interviewed and examined the patient and have personally reviewed any abdominal/pelvic CT scan images.  My additional thoughts are as follows:  I discussed with my PA later in the day and have just now evaluated the patient.  Given his right upper quadrant pain, obstructive appearing jaundice on ultrasound and weight loss, I am concerned for possible pancreatic malignancy.  CT abdomen and pelvis with oral and IV contrast has been ordered.  Will return after the result with further plans.   Nelida Meuse III Office:[phone]

## 2019-07-23 NOTE — ED Notes (Signed)
Pt ambulatory from lobby to room with no reported issues.

## 2019-07-23 NOTE — H&P (Signed)
History and Physical    Jeffrey Harris [id number] DOB: [birth year] DOA: 07/23/2019  Referring MD/NP/PA: Bennie Dallas, PA-C PCP: [name], MD  Patient coming from: home  Chief Complaint:   I have personally briefly reviewed patient's old medical records in [place]   HPI: Jeffrey Harris is a 63 y.o. male with medical history significant of gout.  He presents with complaints of right upper quadrant fullness over the last 3 weeks.  He describes it as an uncomfortable feeling that disturb his sleep.  Noted associated symptoms of loose stools and hemorrhoid. Denies having any nausea, vomiting, fever, shortness of breath, cough, or dysuria.  He has intentionally lost around 30 pounds from last year after being told that he was borderline diabetic.  He admits to drinking alcohol only occasionally and not on a daily basis.  He has used Tylenol PM to help him sleep for the last 2 months but he is not taking more than the recommended amount.  Over the last day he noticed that his eyes were turning yellow and asked when he went to his primary care provider to do lab work.  Lab work was noted to be abnormal and he was sent here to the emergency department for further evaluation.  ED Course: Upon admission into the emergency department patient was noted to have fairly stable vital signs.  Labs significant for lipase 297, alkaline phosphatase 300, AST 114, ALT 164, and total bilirubin 13.6.  Right upper quadrant ultrasound revealed  Dilatation of the CBD up to 29mm and 2 hypoechoic masses. GI was consulted.  Plan for possible MRCP/ERCP in a.m.  TR[facility] called to admit.  Review of Systems  Constitutional: Positive for weight loss. Negative for fever.  HENT: Negative for nosebleeds and sinus pain.   Eyes: Negative for blurred vision and photophobia.       Positive for jaundice denies  Respiratory: Negative for cough and shortness of breath.   Cardiovascular: Negative for chest pain and leg  swelling.  Gastrointestinal: Positive for abdominal pain and diarrhea. Negative for nausea and vomiting.  Genitourinary: Negative for dysuria and hematuria.  Musculoskeletal: Negative for falls and myalgias.  Skin: Negative for rash.  Neurological: Negative for focal weakness and loss of consciousness.  Psychiatric/Behavioral: The patient has insomnia.     Past Medical History:  Diagnosis Date  . Gout     Past Surgical History:  Procedure Laterality Date  . KNEE ARTHROSCOPY       reports that he has never smoked. He does not have any smokeless tobacco history on file. He reports that he does not drink alcohol or use drugs.  No Known Allergies  Family History  Problem Relation Age of Onset  . Diabetes Father     Prior to Admission medications   Medication Sig Start Date End Date Taking? Authorizing Provider  allopurinol (ZYLOPRIM) 300 MG tablet Take 300 mg by mouth daily.    [provider]    Physical Exam:  Constitutional: Obese male NAD, calm, comfortable Vitals:   07/23/19 1217 07/23/19 1220 07/23/19 1225 07/23/19 1230  BP: 137/82   (!) 143/81  Pulse:  74 75 69  Resp:  11 12 12   Temp:      TempSrc:      SpO2:  100% 98% 100%   Eyes: PERRL, scleral icterus present ENMT: Mucous membranes are moist. Posterior pharynx clear of any exudate or lesions.Normal dentition.  Neck: normal, supple, no masses, no thyromegaly Respiratory: clear to  auscultation bilaterally, no wheezing, no crackles. Normal respiratory effort. No accessory muscle use.  Cardiovascular: Regular rate and rhythm, no murmurs / rubs / gallops. No extremity edema. 2+ pedal pulses. No carotid bruits.  Abdomen: no tenderness, mildly distended, no masses palpated. No hepatosplenomegaly. Bowel sounds positive.  Musculoskeletal: no clubbing / cyanosis. No joint deformity upper and lower extremities. Good ROM, no contractures. Normal muscle tone.  Skin: no rashes, lesions, ulcers. No induration  Neurologic: CN 2-12 grossly intact. Sensation intact, DTR normal. Strength 5/5 in all 4.  Psychiatric: Normal judgment and insight. Alert and oriented x 3. Normal mood.     Labs on Admission: I have personally reviewed following labs and imaging studies  CBC: Recent Labs  Lab 07/23/19 1142  WBC 4.6  NEUTROABS 3.0  HGB 13.7  HCT 39.0  MCV 97.3  PLT [id number]   Basic Metabolic Panel: Recent Labs  Lab 07/23/19 1142  NA 134*  K 3.8  CL 99  CO2 25  GLUCOSE 125*  BUN 7  CREATININE 0.72  CALCIUM 9.3   GFR: CrCl cannot be calculated (Unknown ideal weight.). Liver Function Tests: Recent Labs  Lab 07/23/19 1142  AST 114*  ALT 164*  ALKPHOS 300*  BILITOT 13.6*  PROT 7.2  ALBUMIN 3.9   Recent Labs  Lab 07/23/19 1142  LIPASE 297*   No results for input(s): AMMONIA in the last 168 hours. Coagulation Profile: Recent Labs  Lab 07/23/19 1100  INR 1.1   Cardiac Enzymes: No results for input(s): CKTOTAL, CKMB, CKMBINDEX, TROPONINI in the last 168 hours. BNP (last 3 results) No results for input(s): PROBNP in the last 8760 hours. HbA1C: No results for input(s): HGBA1C in the last 72 hours. CBG: No results for input(s): GLUCAP in the last 168 hours. Lipid Profile: No results for input(s): CHOL, HDL, LDLCALC, TRIG, CHOLHDL, LDLDIRECT in the last 72 hours. Thyroid Function Tests: No results for input(s): TSH, T4TOTAL, FREET4, T3FREE, THYROIDAB in the last 72 hours. Anemia Panel: No results for input(s): VITAMINB12, FOLATE, FERRITIN, TIBC, IRON, RETICCTPCT in the last 72 hours. Urine analysis: No results found for: COLORURINE, APPEARANCEUR, LABSPEC, PHURINE, GLUCOSEU, HGBUR, BILIRUBINUR, KETONESUR, PROTEINUR, UROBILINOGEN, NITRITE, LEUKOCYTESUR Sepsis Labs: No results found for this or any previous visit (from the past 240 hour(s)).   Radiological Exams on Admission: US Abdomen Limited Ruq  Result Date: 07/23/2019 CLINICAL DATA:  Abdominal discomfort and jaundice  EXAM: ULTRASOUND ABDOMEN LIMITED RIGHT UPPER QUADRANT COMPARISON:  None. FINDINGS: Gallbladder: No gallstones or wall thickening visualized. No sonographic Murphy sign noted by sonographer. Common bile duct: Diameter: 9 mm Liver: Heterogeneous echotexture. Ill-defined 2.1 x 2.1 x 1.5 cm hypoechoic area in the right lobe and 1.7 x 1.6 x 1.5 cm hypoechoic area in the left lobe. Portal vein is patent on color Doppler imaging with normal direction of blood flow towards the liver. Other: None. IMPRESSION: Dilatation of the common bile duct. Heterogeneous appearance of the liver with two nonspecific ill-defined hypoechoic areas. No evidence of acute cholecystitis. Electronically Signed   By: Macy Mis M.D.   On: 07/23/2019 11:23     Assessment/Plan Hyperbilirubinemia and elevated liver enzymes: Acute.  Patient presents with complaints of jaundice and right upper quadrant abdominal pain.  Labs significant for lipase 297, alkaline phosphatase 300, AST 114, ALT 164, and total bilirubin 13.6.  Ultrasound imaging revealed dilated common bile duct with 2 nonspecific hypoechoic areas seen on the liver.  Question the possibility of underlying malignancy given reports weight loss although patient reports  are intentional. -Admit to MedSurg bed  -N.p.o. -Normal saline IV fluids at 75 mL/h -Recheck liver function studies in a.m. -Appreciate GI consultative services, will follow-up for further recommendations -Follow-up CT scan of the abdomen pelvis with contrast per GI -MRCP versus ERCP per GI  History of gout: Patient on allopurinol and denies any reports of acute flare. -Continue allopurinol  Obesity: BMI estimated at 35.7 kg/m  DVT prophylaxis: Lovenox Code Status: full Family Communication: No family present at bedside Disposition Plan: Likely discharge home once medically stable Consults called:GI Admission status: inpatient   Norval Morton MD [facility] Pager [phone]   If  7PM-7AM, please contact night-coverage [email or web address] Password [email or web address]  07/23/2019, 1:45 PM

## 2019-07-23 NOTE — ED Notes (Signed)
PA at bedside.

## 2019-07-23 NOTE — Progress Notes (Addendum)
Received patient from ED. Patient alert and oriented. Self ambulated from stretcher to bathroom and bed. Patient resting comfortable in bed, no complaints of pain. Call bell within reach. Will continue to monitor.

## 2019-07-23 NOTE — ED Triage Notes (Signed)
Pt arrives via POV from home with abnormal labs, jaundice. Per provider pt with elevated LFTS, bili, poss cholecystitis or ruleout malignacy, RUQ pain for the last 4-5 days. VSS. NAD at present.

## 2019-07-24 ENCOUNTER — Inpatient Hospital Stay (HOSPITAL_COMMUNITY): Payer: BC Managed Care – PPO

## 2019-07-24 DIAGNOSIS — K8689 Other specified diseases of pancreas: Secondary | ICD-10-CM

## 2019-07-24 DIAGNOSIS — R17 Unspecified jaundice: Secondary | ICD-10-CM

## 2019-07-24 DIAGNOSIS — C250 Malignant neoplasm of head of pancreas: Secondary | ICD-10-CM

## 2019-07-24 DIAGNOSIS — R160 Hepatomegaly, not elsewhere classified: Secondary | ICD-10-CM

## 2019-07-24 DIAGNOSIS — C787 Secondary malignant neoplasm of liver and intrahepatic bile duct: Secondary | ICD-10-CM

## 2019-07-24 DIAGNOSIS — C772 Secondary and unspecified malignant neoplasm of intra-abdominal lymph nodes: Secondary | ICD-10-CM

## 2019-07-24 LAB — COMPREHENSIVE METABOLIC PANEL
ALT: 139 U/L — ABNORMAL HIGH (ref 0–44)
AST: 95 U/L — ABNORMAL HIGH (ref 15–41)
Albumin: 3.3 g/dL — ABNORMAL LOW (ref 3.5–5.0)
Alkaline Phosphatase: 242 U/L — ABNORMAL HIGH (ref 38–126)
Anion gap: 10 (ref 5–15)
BUN: 6 mg/dL (ref 6–20)
CO2: 24 mmol/L (ref 22–32)
Calcium: 8.7 mg/dL — ABNORMAL LOW (ref 8.9–10.3)
Chloride: 99 mmol/L (ref 98–111)
Creatinine, Ser: 0.68 mg/dL (ref 0.61–1.24)
GFR calc Af Amer: >60 mL/min (ref >60)
GFR calc non Af Amer: >60 mL/min (ref >60)
Glucose, Bld: 131 mg/dL — ABNORMAL HIGH (ref 70–99)
Potassium: 4.2 mmol/L (ref 3.5–5.1)
Sodium: 133 mmol/L — ABNORMAL LOW (ref 135–145)
Total Bilirubin: 11.6 mg/dL — ABNORMAL HIGH (ref 0.3–1.2)
Total Protein: 6.5 g/dL (ref 6.5–8.1)

## 2019-07-24 LAB — CBC
HCT: 34.9 % — ABNORMAL LOW (ref 39.0–52.0)
Hemoglobin: 12.4 g/dL — ABNORMAL LOW (ref 13.0–17.0)
MCH: 34.3 pg — ABNORMAL HIGH (ref 26.0–34.0)
MCHC: 35.5 g/dL (ref 30.0–36.0)
MCV: 96.4 fL (ref 80.0–100.0)
Platelets: 197 K/uL (ref 150–400)
RBC: 3.62 MIL/uL — ABNORMAL LOW (ref 4.22–5.81)
RDW: 12.3 % (ref 11.5–15.5)
WBC: 4.9 K/uL (ref 4.0–10.5)
nRBC: 0 % (ref 0.0–0.2)

## 2019-07-24 LAB — SARS CORONAVIRUS 2 (TAT 6-24 HRS): SARS Coronavirus 2: NEGATIVE

## 2019-07-24 LAB — ABO/RH: ABO/RH(D): A NEG

## 2019-07-24 MED ORDER — IOHEXOL 300 MG/ML  SOLN
75.0000 mL | Freq: Once | INTRAMUSCULAR | Status: AC | PRN
  Administered 2019-07-24: 75 mL via INTRAVENOUS

## 2019-07-24 NOTE — H&P (View-Only) (Signed)
Daily Rounding Note  07/24/2019, 10:35 AM  LOS: 1 day   SUBJECTIVE:   Chief complaint:     Obstructive jaundice  He feels much the same today, with some dull right upper quadrant pain. He is not itching, no fever reported. His appetite has been good with no nausea or vomiting.   OBJECTIVE:         Vital signs in last 24 hours:    Temp:  [97.9 F (36.6 C)-98.3 F (36.8 C)] 98 F (36.7 C) (11/20 0611) Pulse Rate:  [62-75] 69 (11/20 0611) Resp:  [11-20] 20 (11/20 0611) BP: (121-156)/(68-84) 121/68 (11/20 0611) SpO2:  [98 %-100 %] 100 % (11/20 [id number]) Last BM Date: 07/23/19 There were no vitals filed for this visit. General: Well-appearing, affect seems down. Heart: Regular rate and rhythm, no appreciable murmur no peripheral edema Chest: Lungs clear to auscultation bilaterally Abdomen: Soft nondistended, mild RUQ tenderness as before Jaundice and icterus Mental status normal, no gross motor deficits. Ambulates back-and-forth to bathroom without difficulty  Intake/Output from previous day: 11/19 0701 - 11/20 0700 In: 847.6 [I.V.:847.6] Out: -   Intake/Output this shift: Total I/O In: 480 [P.O.:480] Out: -   Lab Results: Recent Labs    07/23/19 1142 07/24/19 0154  WBC 4.6 4.9  HGB 13.7 12.4*  HCT 39.0 34.9*  PLT 228 197   BMET Recent Labs    07/23/19 1142 07/24/19 0154  NA 134* 133*  K 3.8 4.2  CL 99 99  CO2 25 24  GLUCOSE 125* 131*  BUN 7 6  CREATININE 0.72 0.68  CALCIUM 9.3 8.7*   LFT Recent Labs    07/23/19 1142 07/24/19 0154  PROT 7.2 6.5  ALBUMIN 3.9 3.3*  AST 114* 95*  ALT 164* 139*  ALKPHOS 300* 242*  BILITOT 13.6* 11.6*   PT/INR Recent Labs    07/23/19 1100  LABPROT 14.4  INR 1.1   Hepatitis Panel No results for input(s): HEPBSAG, HCVAB, HEPAIGM, HEPBIGM in the last 72 hours.  Studies/Results: Ct Abdomen Pelvis W Contrast  Result Date: 07/24/2019 CLINICAL DATA:   Painless jaundice for 1 week. Unintentional weight loss over past 3 months. EXAM: CT ABDOMEN AND PELVIS WITH CONTRAST TECHNIQUE: Multidetector CT imaging of the abdomen and pelvis was performed using the standard protocol following bolus administration of intravenous contrast. CONTRAST:  160mL OMNIPAQUE IOHEXOL 300 MG/ML  SOLN COMPARISON:  None. FINDINGS: Lower Chest: No acute findings. Hepatobiliary: Multiple small hypovascular lesions are seen throughout the right and left lobes. Largest lesion is seen in segment 4A, measuring 2.1 x 1.5 cm on image 12/3. These are consistent with diffuse pulmonary metastases. The gallbladder is distended, however there are no signs of acute cholecystitis. Diffuse biliary ductal dilatation is seen to the level of the pancreatic head. Pancreas: Subtle ill-defined soft tissue prominence is seen in the pancreatic head which measures 2.8 x 2.6 cm, and is suspicious for pancreatic carcinoma. No evidence of pancreatic ductal dilatation or peripancreatic inflammatory changes. Spleen: Within normal limits in size and appearance. Adrenals/Urinary Tract: A 2.4 cm fat attenuation mass is seen in the left adrenal gland, consistent with a benign myelolipoma. The right adrenal gland and left kidney are normal appearance. Right-sided pelvic kidney is noted which is otherwise unremarkable in appearance. No evidence of renal mass or hydronephrosis. Unremarkable unopacified urinary bladder. Stomach/Bowel: No evidence of obstruction, inflammatory process or abnormal fluid collections. Normal appendix visualized. Vascular/Lymphatic: A 1.3 cm peripancreatic lymph node  is seen just posterior to the portal venous confluence on image 19/3. A 1.4 cm porta hepatis lymph node is seen on image 17/3. 8 mm lymph node is also seen adjacent to the GE junction on image 12/13. No other pathologically enlarged lymph nodes are identified. There is no evidence of vascular involvement of SMA, SMV, or portal vein.  Reproductive:  No mass or other significant abnormality. Other:  None. Musculoskeletal:  No suspicious bone lesions identified. IMPRESSION: 1. Diffuse biliary ductal dilatation, with approximately 2.8 cm soft tissue prominence in the pancreatic head, highly suspicious for primary pancreatic carcinoma. Recommend abdomen MRI and MRCP without and with contrast for further evaluation. 2. Diffuse liver metastases. 3. Mild peripancreatic and porta hepatis lymphadenopathy, suspicious for metastatic disease. 4. 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 Electronically Signed   By: Marlaine Hind M.D.   On: 07/24/2019 07:50   US Abdomen Limited Ruq  Result Date: 07/23/2019 CLINICAL DATA:  Abdominal discomfort and jaundice EXAM: ULTRASOUND ABDOMEN LIMITED RIGHT UPPER QUADRANT COMPARISON:  None. FINDINGS: Gallbladder: No gallstones or wall thickening visualized. No sonographic Murphy sign noted by sonographer. Common bile duct: Diameter: 9 mm Liver: Heterogeneous echotexture. Ill-defined 2.1 x 2.1 x 1.5 cm hypoechoic area in the right lobe and 1.7 x 1.6 x 1.5 cm hypoechoic area in the left lobe. Portal vein is patent on color Doppler imaging with normal direction of blood flow towards the liver. Other: None. IMPRESSION: Dilatation of the common bile duct. Heterogeneous appearance of the liver with two nonspecific ill-defined hypoechoic areas. No evidence of acute cholecystitis. Electronically Signed   By: Macy Mis M.D.   On: 07/23/2019 11:23   Scheduled Meds: . allopurinol  300 mg Oral Daily  . sodium chloride flush  3 mL Intravenous Q12H   Continuous Infusions: . sodium chloride 125 mL/hr at 07/24/19 0840   PRN Meds:.albuterol, alum & mag hydroxide-simeth, ondansetron **OR** ondansetron (ZOFRAN) IV, zolpidem  ASSESMENT:   *   Obstructive Jaundice.   CT confirms diffuse biliary ductal dilatation and 2.8 cm lesion at the head of the pancreas suspicious for primary pancreatic  cancer.  Diffuse liver mets, peripancreatic and porta hepatis lymphadenopathy suspicious for mets LFTs slightly improved   PLAN   *    CA 19/9 tomorrow.  *     ERCP.    *   Needs collection of Covid 19 test to allow for ERCP etc. Asked RN to take care of this.       Jeffrey Harris  07/24/2019, 10:35 AM Phone [phone]  I have discussed the case with the PA, and that is the plan I formulated. I personally interviewed and examined the patient.  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 I discussed all these findings with him and my clinical suspicion. He is understandably upset about that. His wife was reportedly on the way from home but had not yet arrived. He was unable to reach her on the phone during our visit, so I told him I would come back later today when I had more time to sit with both of them. I also left my pager number with the nurse to let me know when his wife arrives.  However, I did already speak with him about an ERCP needed for biliary decompression/stent placement and biliary brushings. I discussed the procedure in detail along with its technical aspects and risks and benefits and he is agreeable.  The benefits and  risks of the planned procedure were described in detail with the patient or (when appropriate) their health care proxy.  Risks were outlined as including, but not limited to, bleeding, infection, perforation, adverse medication reaction leading to cardiac or pulmonary decompensation, pancreatitis (if ERCP).  The limitation of incomplete mucosal visualization was also discussed.  No guarantees or warranties were given.  As it stands now, this will most likely be done tomorrow with Dr. Alexis Frock of the [facility] who is working with me this weekend for biliary backup. Permanent metal stent will be placed, brushings obtained. He would then be able to go home over the weekend for follow-up in our  clinic soon. We would let him know about brushing results, arrange follow-up LFTs soon, and further diagnostic work-up if necessary. If the brushings were nondiagnostic, we would most likely then proceed with liver biopsy rather than EUS. I also reviewed this with my advanced endoscopic colleagues in the interim. Oncology office evaluation would then follow the tissue diagnosis.  CA 19-9 ordered for tomorrow morning.  Total time 35 minutes.  Nelida Meuse III Office: [phone]  Addendum:  Spoke to patient and his wife after she arrived.  Updated on all results and plan. ERCP again reviewed.  He also just had the 6-24 COVID swab done while I was there (somehow had not been done at or since admission)  - H. Loletha Carrow, MD

## 2019-07-24 NOTE — Plan of Care (Signed)
  Problem: Education: Goal: Knowledge of General Education information will improve Description: Including pain rating scale, medication(s)/side effects and non-pharmacologic comfort measures Outcome: Progressing   Problem: Health Behavior/Discharge Planning: Goal: Ability to manage health-related needs will improve Outcome: Progressing   Problem: Clinical Measurements: Goal: Ability to maintain clinical measurements within normal limits will improve Outcome: Progressing Goal: Respiratory complications will improve Outcome: Progressing   Problem: Activity: Goal: Risk for activity intolerance will decrease Outcome: Progressing   Problem: Nutrition: Goal: Adequate nutrition will be maintained Outcome: Progressing   Problem: Coping: Goal: Level of anxiety will decrease Outcome: Progressing   Problem: Elimination: Goal: Will not experience complications related to urinary retention Outcome: Progressing   Problem: Pain Managment: Goal: General experience of comfort will improve Outcome: Progressing   Problem: Safety: Goal: Ability to remain free from injury will improve Outcome: Progressing   Problem: Skin Integrity: Goal: Risk for impaired skin integrity will decrease Outcome: Progressing   

## 2019-07-24 NOTE — Progress Notes (Signed)
PROGRESS NOTE    Patient: Jeffrey Harris                            PCP: [name], MD                    DOB: Aug 11, 1956            DOA: 07/23/2019 [id number]             DOS: 07/24/2019, 8:08 AM   LOS: 1 day   Date of Service: The patient was seen and examined on 07/24/2019  Subjective:   The patient was seen and examined this morning.  Remained stable. Reporting abdominal pain had improved with analgesics, still having discoloration of his eyes, Denies of having any nausea or vomiting.    Brief Narrative:    Jeffrey Harris is a 63 y.o. male with medical history significant of gout.  Prediabetic. He presents with complaints of right upper quadrant fullness over the last 3 weeks.  Associated with loose stool/hemorrhoid.  Intentional weight loss of 30 pounds.   He has used Tylenol PM to help him sleep for the last 2 months but he is not taking more than the recommended amount.   Over the last day he noticed that his eyes were turning yellow and asked when he went to his primary care provider to do lab work.  Lab work was noted to be abnormal and he was sent here to the emergency department for further evaluation.  ED Course: Hemodynamically stable,.  Labs significant for lipase 297, alkaline phosphatase 300, AST 114, ALT 164, and total bilirubin 13.6.  Right upper quadrant ultrasound revealed  Dilatation of the CBD up to 52m and 2 hypoechoic masses.   07/24/2019  - GI was consulted.  Plan for possible MRCP/ERCP  Today -CT of abdomen pelvis was reviewed 2.8 cm pancreatic head, highly suspicious of neoplasm with metastatic lesions to liver.    Assessment & Plan:   Active Problems:   Hyperbilirubinemia   Elevated liver enzymes   History of gout   Obesity (BMI 30-39.9)   Hyperbilirubinemia and elevated liver enzymes:   -Right upper quadrant pain-improvement with as needed analgesics -Associated with jaundice, upper quadrant abdominal pain -some improvement noted  -Right upper quadrant ultrasound-confirming CBD dilatation 9 mm, hypoechoic masses  Transaminitisn / elevated LFTs -Monitoring LFTs closely, avoiding hepatotoxins -Improving Hepatic Function Latest Ref Rng & Units 07/24/2019 07/23/2019  Total Protein 6.5 - 8.1 g/dL 6.5 7.2  Albumin 3.5 - 5.0 g/dL 3.3(L) 3.9  AST 15 - 41 U/L 95(H) 114(H)  ALT 0 - 44 U/L 139(H) 164(H)  Alk Phosphatase 38 - 126 U/L 242(H) 300(H)  Total Bilirubin 0.3 - 1.2 mg/dL 11.6(H) 13.6(H)   Lipase     Component Value Date/Time   LIPASE 297 (H) 07/23/2019 1142    Ultrasound imaging revealed dilated common bile duct with 2 nonspecific hypoechoic areas seen on the liver.  Question the possibility of underlying malignancy given reports weight loss although patient reports are intentional.  -N.p.o. -Continue-normal saline IV fluids at 75 mL/h  -Appreciate GI consultative services, will follow-up for further recommendations -MRCP versus ERCP per GI  CT findings discussed with the patient in detail -tumor markers ordered   New findings of pancreatic mass with mets -Concern for Neoplasm -We will follow the CEA 19 -9 -CT abdomen pelvis: Reviewed, biliary duct dilatation,  2.8 cm soft tissue in the  pancreatic head highly suspicious for pancreatic carcinoma -radiology recommending MRI/MRCP. Diffuse liver mets  -The case was discussed with on-call oncologist Dr. Marin Olp  -we will follow up, anticipating tissue biopsy ? By GI  Recommended  CT of the chest     History of gout:  -Stable -Patient on allopurinol and denies any reports of acute flare. -Continue allopurinol  Obesity:  -Advised on weight monitoring, healthy diet and exercise  - BMI estimated at 35.7 kg/m    ----------------------------------------------------------------------------------------------------------------------------------------------------------  Cultures; none Antimicrobials: None  DVT prophylaxis: SCD/Compression stockings and  Lovenox SQ Code Status:   Code Status: Full Code Family Communication: No family member present at bedside- attempt will be made to update daily The above findings and plan of care has been discussed with patient and family in detail,  they expressed understanding and agreement of above. Disposition Plan:   Anticipated 1-2 days Admission status:  NPATIEN -   Consultants: GI  Imaging; Ultrasound abdomen: No GB stones, sludge, wall thickening.  9 mm CBD.  Liver heterogeneous with 2.1 x 2.1 x 1.5 cm hyperechoic area in the right lobe, 1.7 x 1.6 x 1.5 cm hyperechoic echoic area left lobe.  Portal vein flow normal.   CT ABDOMEN AND PELVIS WITH CONTRAST: CT IMPRESSION: 1. Diffuse biliary ductal dilatation, with approximately 2.8 cm soft tissue prominence in the pancreatic head, highly suspicious for primary pancreatic carcinoma. Recommend abdomen MRI and MRCP without and with contrast for further evaluation. 2. Diffuse liver metastases. 3. Mild peripancreatic and porta hepatis lymphadenopathy, suspicious for metastatic disease. 4. 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    Procedures:     Possible MRCP versus ERCP per GI today 07/24/2019  Antimicrobials:  Anti-infectives (From admission, onward)   None       Medication:  . allopurinol  300 mg Oral Daily  . sodium chloride flush  3 mL Intravenous Q12H    albuterol, alum & mag hydroxide-simeth, ondansetron **OR** ondansetron (ZOFRAN) IV, zolpidem   Objective:   Vitals:   07/23/19 1512 07/23/19 1757 07/23/19 1956 07/24/19 0611  BP: (!) 156/81 133/81 126/84 121/68  Pulse: 69 71 62 69  Resp: _0 Temp: 98 F (36.7 C) 97.9 F (36.6 C) 98.3 F (36.8 C) 98 F (36.7 C)  TempSrc: Oral Oral Oral Oral  SpO2: 100% 100% 99% 100%    Intake/Output Summary (Last 24 hours) at 07/24/2019 2353 Last data filed at 07/24/2019 6144 Gross per 24 hour  Intake 847.63 ml  Output -  Net 847.63  ml   There were no vitals filed for this visit.   Examination:   Physical Exam  Constitution:  Alert, cooperative, no distress,  Appears calm and comfortable  Psychiatric: Normal and stable mood and affect, cognition intact,   HEENT: Eyes icteric, normocephalic, PERRL, otherwise with in Normal limits  Chest:Chest symmetric Cardio vascular:  S1/S2, RRR, No murmure, No Rubs or Gallops  pulmonary: Clear to auscultation bilaterally, respirations unlabored, negative wheezes / crackles Abdomen: Soft, non-tender, non-distended, bowel sounds,no masses, no organomegaly Muscular skeletal: Limited exam - in bed, able to move all 4 extremities, Normal strength,  Neuro: CNII-XII intact. , normal motor and sensation, reflexes intact  Extremities: No pitting edema lower extremities, +2 pulses  Skin: Dry, warm to touch, negative for any Rashes, No open wounds Wounds: per nursing documentation  LABs:  CBC Latest Ref Rng & Units 07/24/2019 07/23/2019  WBC 4.0 - 10.5 K/uL 4.9 4.6  Hemoglobin 13.0 -  17.0 g/dL 12.4(L) 13.7  Hematocrit 39.0 - 52.0 % 34.9(L) 39.0  Platelets 150 - 400 K/uL 197 228   CMP Latest Ref Rng & Units 07/24/2019 07/23/2019  Glucose 70 - 99 mg/dL 131(H) 125(H)  BUN 6 - 20 mg/dL 6 7  Creatinine 0.61 - 1.24 mg/dL 0.68 0.72  Sodium 135 - 145 mmol/L 133(L) 134(L)  Potassium 3.5 - 5.1 mmol/L 4.2 3.8  Chloride 98 - 111 mmol/L 99 99  CO2 22 - 32 mmol/L 24 25  Calcium 8.9 - 10.3 mg/dL 8.7(L) 9.3  Total Protein 6.5 - 8.1 g/dL 6.5 7.2  Total Bilirubin 0.3 - 1.2 mg/dL 11.6(H) 13.6(H)  Alkaline Phos 38 - 126 U/L 242(H) 300(H)  AST 15 - 41 U/L 95(H) 114(H)  ALT 0 - 44 U/L 139(H) 164(H)     SIGNED: Deatra James, MD, FACP, FHM. [facility],  Pager [phone]  If 7PM-7AM, please contact night-coverage [email or web address], Password [facility] 07/24/2019, 8:08 AM

## 2019-07-24 NOTE — Progress Notes (Addendum)
Daily Rounding Note  07/24/2019, 10:35 AM  LOS: 1 day   SUBJECTIVE:   Chief complaint:     Obstructive jaundice  He feels much the same today, with some dull right upper quadrant pain. He is not itching, no fever reported. His appetite has been good with no nausea or vomiting.   OBJECTIVE:         Vital signs in last 24 hours:    Temp:  [97.9 F (36.6 C)-98.3 F (36.8 C)] 98 F (36.7 C) (11/20 0611) Pulse Rate:  [62-75] 69 (11/20 0611) Resp:  [11-20] 20 (11/20 0611) BP: (121-156)/(68-84) 121/68 (11/20 0611) SpO2:  [98 %-100 %] 100 % (11/20 [id number]) Last BM Date: 07/23/19 There were no vitals filed for this visit. General: Well-appearing, affect seems down. Heart: Regular rate and rhythm, no appreciable murmur no peripheral edema Chest: Lungs clear to auscultation bilaterally Abdomen: Soft nondistended, mild RUQ tenderness as before Jaundice and icterus Mental status normal, no gross motor deficits. Ambulates back-and-forth to bathroom without difficulty  Intake/Output from previous day: 11/19 0701 - 11/20 0700 In: 847.6 [I.V.:847.6] Out: -   Intake/Output this shift: Total I/O In: 480 [P.O.:480] Out: -   Lab Results: Recent Labs    07/23/19 1142 07/24/19 0154  WBC 4.6 4.9  HGB 13.7 12.4*  HCT 39.0 34.9*  PLT 228 197   BMET Recent Labs    07/23/19 1142 07/24/19 0154  NA 134* 133*  K 3.8 4.2  CL 99 99  CO2 25 24  GLUCOSE 125* 131*  BUN 7 6  CREATININE 0.72 0.68  CALCIUM 9.3 8.7*   LFT Recent Labs    07/23/19 1142 07/24/19 0154  PROT 7.2 6.5  ALBUMIN 3.9 3.3*  AST 114* 95*  ALT 164* 139*  ALKPHOS 300* 242*  BILITOT 13.6* 11.6*   PT/INR Recent Labs    07/23/19 1100  LABPROT 14.4  INR 1.1   Hepatitis Panel No results for input(s): HEPBSAG, HCVAB, HEPAIGM, HEPBIGM in the last 72 hours.  Studies/Results: Ct Abdomen Pelvis W Contrast  Result Date: 07/24/2019 CLINICAL DATA:   Painless jaundice for 1 week. Unintentional weight loss over past 3 months. EXAM: CT ABDOMEN AND PELVIS WITH CONTRAST TECHNIQUE: Multidetector CT imaging of the abdomen and pelvis was performed using the standard protocol following bolus administration of intravenous contrast. CONTRAST:  150mL OMNIPAQUE IOHEXOL 300 MG/ML  SOLN COMPARISON:  None. FINDINGS: Lower Chest: No acute findings. Hepatobiliary: Multiple small hypovascular lesions are seen throughout the right and left lobes. Largest lesion is seen in segment 4A, measuring 2.1 x 1.5 cm on image 12/3. These are consistent with diffuse pulmonary metastases. The gallbladder is distended, however there are no signs of acute cholecystitis. Diffuse biliary ductal dilatation is seen to the level of the pancreatic head. Pancreas: Subtle ill-defined soft tissue prominence is seen in the pancreatic head which measures 2.8 x 2.6 cm, and is suspicious for pancreatic carcinoma. No evidence of pancreatic ductal dilatation or peripancreatic inflammatory changes. Spleen: Within normal limits in size and appearance. Adrenals/Urinary Tract: A 2.4 cm fat attenuation mass is seen in the left adrenal gland, consistent with a benign myelolipoma. The right adrenal gland and left kidney are normal appearance. Right-sided pelvic kidney is noted which is otherwise unremarkable in appearance. No evidence of renal mass or hydronephrosis. Unremarkable unopacified urinary bladder. Stomach/Bowel: No evidence of obstruction, inflammatory process or abnormal fluid collections. Normal appendix visualized. Vascular/Lymphatic: A 1.3 cm peripancreatic lymph node  is seen just posterior to the portal venous confluence on image 19/3. A 1.4 cm porta hepatis lymph node is seen on image 17/3. 8 mm lymph node is also seen adjacent to the GE junction on image 12/13. No other pathologically enlarged lymph nodes are identified. There is no evidence of vascular involvement of SMA, SMV, or portal vein.  Reproductive:  No mass or other significant abnormality. Other:  None. Musculoskeletal:  No suspicious bone lesions identified. IMPRESSION: 1. Diffuse biliary ductal dilatation, with approximately 2.8 cm soft tissue prominence in the pancreatic head, highly suspicious for primary pancreatic carcinoma. Recommend abdomen MRI and MRCP without and with contrast for further evaluation. 2. Diffuse liver metastases. 3. Mild peripancreatic and porta hepatis lymphadenopathy, suspicious for metastatic disease. 4. 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 Electronically Signed   By: Marlaine Hind M.D.   On: 07/24/2019 07:50   US Abdomen Limited Ruq  Result Date: 07/23/2019 CLINICAL DATA:  Abdominal discomfort and jaundice EXAM: ULTRASOUND ABDOMEN LIMITED RIGHT UPPER QUADRANT COMPARISON:  None. FINDINGS: Gallbladder: No gallstones or wall thickening visualized. No sonographic Murphy sign noted by sonographer. Common bile duct: Diameter: 9 mm Liver: Heterogeneous echotexture. Ill-defined 2.1 x 2.1 x 1.5 cm hypoechoic area in the right lobe and 1.7 x 1.6 x 1.5 cm hypoechoic area in the left lobe. Portal vein is patent on color Doppler imaging with normal direction of blood flow towards the liver. Other: None. IMPRESSION: Dilatation of the common bile duct. Heterogeneous appearance of the liver with two nonspecific ill-defined hypoechoic areas. No evidence of acute cholecystitis. Electronically Signed   By: Macy Mis M.D.   On: 07/23/2019 11:23   Scheduled Meds: . allopurinol  300 mg Oral Daily  . sodium chloride flush  3 mL Intravenous Q12H   Continuous Infusions: . sodium chloride 125 mL/hr at 07/24/19 0840   PRN Meds:.albuterol, alum & mag hydroxide-simeth, ondansetron **OR** ondansetron (ZOFRAN) IV, zolpidem  ASSESMENT:   *   Obstructive Jaundice.   CT confirms diffuse biliary ductal dilatation and 2.8 cm lesion at the head of the pancreas suspicious for primary pancreatic  cancer.  Diffuse liver mets, peripancreatic and porta hepatis lymphadenopathy suspicious for mets LFTs slightly improved   PLAN   *    CA 19/9 tomorrow.  *     ERCP.    *   Needs collection of Covid 19 test to allow for ERCP etc. Asked RN to take care of this.       Jeffrey Harris  07/24/2019, 10:35 AM Phone [phone]  I have discussed the case with the PA, and that is the plan I formulated. I personally interviewed and examined the patient.  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 I discussed all these findings with him and my clinical suspicion. He is understandably upset about that. His wife was reportedly on the way from home but had not yet arrived. He was unable to reach her on the phone during our visit, so I told him I would come back later today when I had more time to sit with both of them. I also left my pager number with the nurse to let me know when his wife arrives.  However, I did already speak with him about an ERCP needed for biliary decompression/stent placement and biliary brushings. I discussed the procedure in detail along with its technical aspects and risks and benefits and he is agreeable.  The benefits and  risks of the planned procedure were described in detail with the patient or (when appropriate) their health care proxy.  Risks were outlined as including, but not limited to, bleeding, infection, perforation, adverse medication reaction leading to cardiac or pulmonary decompensation, pancreatitis (if ERCP).  The limitation of incomplete mucosal visualization was also discussed.  No guarantees or warranties were given.  As it stands now, this will most likely be done tomorrow with Dr. Alexis Frock of the [facility] who is working with me this weekend for biliary backup. Permanent metal stent will be placed, brushings obtained. He would then be able to go home over the weekend for follow-up in our  clinic soon. We would let him know about brushing results, arrange follow-up LFTs soon, and further diagnostic work-up if necessary. If the brushings were nondiagnostic, we would most likely then proceed with liver biopsy rather than EUS. I also reviewed this with my advanced endoscopic colleagues in the interim. Oncology office evaluation would then follow the tissue diagnosis.  CA 19-9 ordered for tomorrow morning.  Total time 35 minutes.  Nelida Meuse III Office: [phone]  Addendum:  Spoke to patient and his wife after she arrived.  Updated on all results and plan. ERCP again reviewed.  He also just had the 6-24 COVID swab done while I was there (somehow had not been done at or since admission)  - H. Loletha Carrow, MD

## 2019-07-24 NOTE — Plan of Care (Signed)
  Problem: Pain Managment: Goal: General experience of comfort will improve Outcome: Progressing   Problem: Safety: Goal: Ability to remain free from injury will improve Outcome: Progressing   Problem: Skin Integrity: Goal: Risk for impaired skin integrity will decrease Outcome: Progressing   

## 2019-07-24 NOTE — Progress Notes (Signed)
Pt's wife at bedside and Wray Kearns, MD paged as requested. Will continue to monitor.

## 2019-07-24 NOTE — Consult Note (Signed)
Referral MD  Reason for Referral: Metastatic pancreatic cancer-liver and nodal metastasis  Chief Complaint  Patient presents with  . Jaundice  . Abnormal labs  : I became jaundiced.  HPI: [name] is a very nice 63 year old white male.  His birthday is actually coming up in a couple weeks.  He is originally from [place].  He works for [employer].  He and his wife live in [place], [place].  He has been in good health his whole life.  He played sports.  He is still quite active in sports.  He said he saw his family doctor back in July and everything seemed to be okay.  He noted that he began to lose some weight over the past year.  He is lost about 35 pounds.  He says he is just trying to eat less.  Try to watch what he eats.  He subsequently noted that he turned jaundiced with his eyes.  He was seen by his family doctor.  He was referred to the emergency room.  He is subsequently was admitted.  He was admitted on 07/23/2019.  At the time, his bilirubin was 13.6.  SG PT 164 SGOT 114.  His lipase was 297.  Alkaline phosphatase 300.  His BUN was 7 and creatinine 0.72.  His white cell count was 4.6.  Hemoglobin 13.7.  He ultimately underwent a CT scan of the abdomen.  This was done on 07/23/2019.  This showed a 2.8 x 2.6 mass in the pancreatic head.  He had ductal dilatation.  He had multiple liver metastasis.  He had multiple swollen lymph nodes in the abdomen.  He has been seen by gastroenterology.  He is to undergo an ERCP on Saturday.  He had a CT scan of the chest.  Thankfully, there is no evidence of metastatic disease to the chest.  A CA 19-9 is pending.  I would have to say that his performance status is easily ECOG 1.  He does not smoke.  He really does not drink alcohol.  There is no obvious family history of cancer.  He has had past surgery but just knee surgery.      Past Medical History:  Diagnosis Date  . Gout   :  Past Surgical History:   Procedure Laterality Date  . KNEE ARTHROSCOPY    :   Current Facility-Administered Medications:  .  0.9 %  sodium chloride infusion, , Intravenous, Continuous, Shahmehdi, Seyed A, MD, Last Rate: 125 mL/hr at 07/24/19 1457 .  albuterol (PROVENTIL) (2.5 MG/3ML) 0.083% nebulizer solution 2.5 mg, 2.5 mg, Nebulization, Q6H PRN, Smith, Rondell A, MD .  allopurinol (ZYLOPRIM) tablet 300 mg, 300 mg, Oral, Daily, Tamala Julian, Rondell A, MD, 300 mg at 07/24/19 0955 .  alum & mag hydroxide-simeth (MAALOX/MYLANTA) 200-200-20 MG/5ML suspension 30 mL, 30 mL, Oral, Q4H PRN, Fuller Plan A, MD, 30 mL at 07/23/19 2303 .  ondansetron (ZOFRAN) tablet 4 mg, 4 mg, Oral, Q6H PRN **OR** ondansetron (ZOFRAN) injection 4 mg, 4 mg, Intravenous, Q6H PRN, Smith, Rondell A, MD .  sodium chloride flush (NS) 0.9 % injection 3 mL, 3 mL, Intravenous, Q12H, Smith, Rondell A, MD, 3 mL at 07/23/19 1531 .  zolpidem (AMBIEN) tablet 5 mg, 5 mg, Oral, QHS PRN, Fuller Plan A, MD, 5 mg at 07/23/19 2303:  . allopurinol  300 mg Oral Daily  . sodium chloride flush  3 mL Intravenous Q12H  :  No Known Allergies:  Family History  Problem Relation  Age of Onset  . Diabetes Father   :  Social History   Socioeconomic History  . Marital status: Married    Spouse name: Not on file  . Number of children: Not on file  . Years of education: Not on file  . Highest education level: Not on file  Occupational History  . Not on file  Social Needs  . Financial resource strain: Not on file  . Food insecurity    Worry: Not on file    Inability: Not on file  . Transportation needs    Medical: Not on file    Non-medical: Not on file  Tobacco Use  . Smoking status: Never Smoker  Substance and Sexual Activity  . Alcohol use: No  . Drug use: No  . Sexual activity: Never  Lifestyle  . Physical activity    Days per week: Not on file    Minutes per session: Not on file  . Stress: Not on file  Relationships  . Social Product manager on phone: Not on file    Gets together: Not on file    Attends religious service: Not on file    Active member of club or organization: Not on file    Attends meetings of clubs or organizations: Not on file    Relationship status: Not on file  . Intimate partner violence    Fear of current or ex partner: Not on file    Emotionally abused: Not on file    Physically abused: Not on file    Forced sexual activity: Not on file  Other Topics Concern  . Not on file  Social History Narrative  . Not on file  :  Review of Systems  Constitutional: Positive for malaise/fatigue and weight loss.  HENT: Negative.   Eyes: Negative.   Respiratory: Negative.   Cardiovascular: Negative.   Gastrointestinal: Positive for abdominal pain and constipation.  Genitourinary: Negative.   Musculoskeletal: Negative.   Skin: Negative.   Neurological: Negative.   Endo/Heme/Allergies: Negative.   Psychiatric/Behavioral: Negative.      Exam: Well-developed well-nourished white male who is jaundiced.  His vital signs show temperature of 97 8.  Pulse is 92.  Blood pressure is 145/79.  His weight is 235 pounds.  Head neck exam shows scleral icterus.  He has no adenopathy in the neck.  Lungs are clear bilaterally.  Cardiac exam regular rate and rhythm with no murmurs, rubs or bruits.  Abdomen is soft.  He has good bowel sounds.  There is no fluid wave.  Maybe some slight tenderness to palpation over on the right side.  There is no obvious abdominal mass.  There is no palpable liver or spleen tip.  Back exam shows no tenderness over the spine, ribs or hips.  Extremities shows no clubbing, cyanosis or edema.  Good range of motion of his joints.  Skin exam shows jaundice.  Neurological exam is nonfocal.  Patient Vitals for the past 24 hrs:  BP Temp Temp src Pulse Resp SpO2  07/24/19 1706 (!) 145/79 97.8 F (36.6 C) Oral 75 18 97 %  07/24/19 0611 121/68 98 F (36.7 C) Oral 69 20 100 %  07/23/19 1956 126/84 98.3  F (36.8 C) Oral 62 14 99 %  07/23/19 1757 133/81 97.9 F (36.6 C) Oral 71 16 100 %     Recent Labs    07/23/19 1142 07/24/19 0154  WBC 4.6 4.9  HGB 13.7 12.4*  HCT 39.0  34.9*  PLT 228 197   Recent Labs    07/23/19 1142 07/24/19 0154  NA 134* 133*  K 3.8 4.2  CL 99 99  CO2 25 24  GLUCOSE 125* 131*  BUN 7 6  CREATININE 0.72 0.68  CALCIUM 9.3 8.7*    Blood smear review: None  Pathology: Pending    Assessment and Plan: [name] is a very nice 63 year old white male.  I have to believe that he is is going up with metastatic pancreatic cancer.  I would have to believe that this is adenocarcinoma.  We do not have a pathology specimen yet.  Hopefully this will be obtained tomorrow.  It is critical that we try to get as much specimen as possible.  We really need to have another specimen to send off for genetic testing.  With pancreatic cancer, we are now finding more genetic markers that we can target without having to use chemotherapy.  I talked to he and his wife for about an hour.  I explained to them that we are looking at a disease that is treatable but not curable.  Again, I prefaced everything by saying that we do not yet have the exact diagnosis of cancer but that I suspect that he does have a pancreatic adenocarcinoma.  Again explained to them that the standard therapy is going to be chemotherapy.  Given that he is in excellent shape, I would probably recommend he be treated with FOLFIRINOX.  I think this would be considered standard for somebody with such a good performance status.  He will need to have a Port-A-Cath placed.  Again, we really need to get make sure that we get no specimen so that we have enough for genetic testing.  I really need to see if the tumor is BRCA positive.  We need to make sure that it has a PD-L1 test done.  We are now looking at HER-2 as a marker for therapy.  He it sounds like he will have his procedure tomorrow.  Hopefully he will  then be able to go home.  We will follow him up as an outpatient.  We will try to move along as quickly as possible as far as getting treatment started.  I hate the fact that his birthday is coming up will soon.  I answered all of their questions.  They do have a lot of faith.  It was nice that we could share that.  I appreciate everybody's help with Mr. Debois.  I know this is a difficult time for him.  A lot of information has been given to him.  Lattie Haw, MD  Jeneen Rinks 1:5

## 2019-07-25 ENCOUNTER — Encounter (HOSPITAL_COMMUNITY): Payer: Self-pay

## 2019-07-25 ENCOUNTER — Inpatient Hospital Stay (HOSPITAL_COMMUNITY): Payer: BC Managed Care – PPO | Admitting: Anesthesiology

## 2019-07-25 ENCOUNTER — Encounter (HOSPITAL_COMMUNITY)
Admission: EM | Disposition: A | Discharge status: Home / Self Care | Payer: Self-pay | Source: Home / Self Care | Attending: Internal Medicine

## 2019-07-25 ENCOUNTER — Inpatient Hospital Stay (HOSPITAL_COMMUNITY): Payer: BC Managed Care – PPO

## 2019-07-25 DIAGNOSIS — K831 Obstruction of bile duct: Secondary | ICD-10-CM

## 2019-07-25 DIAGNOSIS — K3189 Other diseases of stomach and duodenum: Secondary | ICD-10-CM | POA: Diagnosis not present

## 2019-07-25 DIAGNOSIS — Z8739 Personal history of other diseases of the musculoskeletal system and connective tissue: Secondary | ICD-10-CM | POA: Diagnosis not present

## 2019-07-25 DIAGNOSIS — C170 Malignant neoplasm of duodenum: Secondary | ICD-10-CM | POA: Diagnosis not present

## 2019-07-25 DIAGNOSIS — R7989 Other specified abnormal findings of blood chemistry: Secondary | ICD-10-CM | POA: Diagnosis not present

## 2019-07-25 DIAGNOSIS — R17 Unspecified jaundice: Secondary | ICD-10-CM | POA: Diagnosis not present

## 2019-07-25 DIAGNOSIS — K838 Other specified diseases of biliary tract: Secondary | ICD-10-CM | POA: Diagnosis not present

## 2019-07-25 DIAGNOSIS — R933 Abnormal findings on diagnostic imaging of other parts of digestive tract: Secondary | ICD-10-CM | POA: Diagnosis not present

## 2019-07-25 DIAGNOSIS — K8689 Other specified diseases of pancreas: Secondary | ICD-10-CM | POA: Diagnosis not present

## 2019-07-25 DIAGNOSIS — E871 Hypo-osmolality and hyponatremia: Secondary | ICD-10-CM | POA: Diagnosis not present

## 2019-07-25 DIAGNOSIS — R1011 Right upper quadrant pain: Secondary | ICD-10-CM | POA: Diagnosis not present

## 2019-07-25 DIAGNOSIS — E669 Obesity, unspecified: Secondary | ICD-10-CM | POA: Diagnosis not present

## 2019-07-25 HISTORY — PX: ESOPHAGOGASTRODUODENOSCOPY: SHX5428

## 2019-07-25 HISTORY — PX: BIOPSY: SHX5522

## 2019-07-25 LAB — COMPREHENSIVE METABOLIC PANEL
ALT: 130 U/L — ABNORMAL HIGH (ref 0–44)
AST: 82 U/L — ABNORMAL HIGH (ref 15–41)
Albumin: 3.2 g/dL — ABNORMAL LOW (ref 3.5–5.0)
Alkaline Phosphatase: 251 U/L — ABNORMAL HIGH (ref 38–126)
Anion gap: 11 (ref 5–15)
BUN: 5 mg/dL — ABNORMAL LOW (ref 6–20)
CO2: 22 mmol/L (ref 22–32)
Calcium: 8.5 mg/dL — ABNORMAL LOW (ref 8.9–10.3)
Chloride: 101 mmol/L (ref 98–111)
Creatinine, Ser: 0.79 mg/dL (ref 0.61–1.24)
GFR calc Af Amer: >60 mL/min (ref >60)
GFR calc non Af Amer: >60 mL/min (ref >60)
Glucose, Bld: 130 mg/dL — ABNORMAL HIGH (ref 70–99)
Potassium: 4.2 mmol/L (ref 3.5–5.1)
Sodium: 134 mmol/L — ABNORMAL LOW (ref 135–145)
Total Bilirubin: 11.7 mg/dL — ABNORMAL HIGH (ref 0.3–1.2)
Total Protein: 6.3 g/dL — ABNORMAL LOW (ref 6.5–8.1)

## 2019-07-25 LAB — LIPASE, BLOOD: Lipase: 781 U/L — ABNORMAL HIGH (ref 11–51)

## 2019-07-25 LAB — CANCER ANTIGEN 19-9: CA 19-9: 10864 U/mL — ABNORMAL HIGH (ref 0–35)

## 2019-07-25 LAB — PROTIME-INR
INR: 1.2 (ref 0.8–1.2)
Prothrombin Time: 15.5 seconds — ABNORMAL HIGH (ref 11.4–15.2)

## 2019-07-25 SURGERY — EGD (ESOPHAGOGASTRODUODENOSCOPY)
Anesthesia: General

## 2019-07-25 MED ORDER — PHENYLEPHRINE HCL-NACL 10-0.9 MG/250ML-% IV SOLN
INTRAVENOUS | Status: DC | PRN
  Administered 2019-07-25: 25 ug/min via INTRAVENOUS

## 2019-07-25 MED ORDER — MIDAZOLAM HCL 2 MG/2ML IJ SOLN
INTRAMUSCULAR | Status: DC | PRN
  Administered 2019-07-25: 2 mg via INTRAVENOUS

## 2019-07-25 MED ORDER — FENTANYL CITRATE (PF) 250 MCG/5ML IJ SOLN
INTRAMUSCULAR | Status: DC | PRN
  Administered 2019-07-25: 60 ug via INTRAVENOUS
  Administered 2019-07-25: 50 ug via INTRAVENOUS

## 2019-07-25 MED ORDER — INDOMETHACIN 50 MG RE SUPP: 100.0000 mg | Freq: Once | RECTAL | Status: DC

## 2019-07-25 MED ORDER — INDOMETHACIN 50 MG RE SUPP
RECTAL | Status: AC
  Filled 2019-07-25: qty 2

## 2019-07-25 MED ORDER — PHENYLEPHRINE HCL (PRESSORS) 10 MG/ML IV SOLN
INTRAVENOUS | Status: DC | PRN
  Administered 2019-07-25 (×3): 80 ug via INTRAVENOUS

## 2019-07-25 MED ORDER — GLUCAGON HCL RDNA (DIAGNOSTIC) 1 MG IJ SOLR
INTRAMUSCULAR | Status: AC
  Filled 2019-07-25: qty 2

## 2019-07-25 MED ORDER — LACTATED RINGERS IV SOLN: INTRAVENOUS | Status: DC | PRN

## 2019-07-25 MED ORDER — LACTATED RINGERS IV SOLN
INTRAVENOUS | Status: DC | PRN
  Administered 2019-07-25 (×2): via INTRAVENOUS

## 2019-07-25 MED ORDER — ONDANSETRON HCL 4 MG/2ML IJ SOLN
INTRAMUSCULAR | Status: DC | PRN
  Administered 2019-07-25: 4 mg via INTRAVENOUS

## 2019-07-25 MED ORDER — SODIUM CHLORIDE 0.9 % IV SOLN
3.0000 g | INTRAVENOUS | Status: AC
  Administered 2019-07-25: 3 g via INTRAVENOUS
  Filled 2019-07-25: qty 3

## 2019-07-25 MED ORDER — SUGAMMADEX SODIUM 200 MG/2ML IV SOLN
INTRAVENOUS | Status: DC | PRN
  Administered 2019-07-25: 200 mg via INTRAVENOUS

## 2019-07-25 MED ORDER — PIPERACILLIN-TAZOBACTAM 3.375 G IVPB 30 MIN
3.3750 g | INTRAVENOUS | Status: AC
  Administered 2019-07-26: 3.375 g via INTRAVENOUS
  Filled 2019-07-25 (×3): qty 50

## 2019-07-25 MED ORDER — PANTOPRAZOLE SODIUM 40 MG IV SOLR
40.0000 mg | Freq: Two times a day (BID) | INTRAVENOUS | Status: DC
  Administered 2019-07-25 – 2019-07-26 (×3): 40 mg via INTRAVENOUS
  Filled 2019-07-25 (×3): qty 40

## 2019-07-25 MED ORDER — PROPOFOL 10 MG/ML IV BOLUS
INTRAVENOUS | Status: DC | PRN
  Administered 2019-07-25: 160 mg via INTRAVENOUS

## 2019-07-25 MED ORDER — DEXAMETHASONE SODIUM PHOSPHATE 10 MG/ML IJ SOLN
INTRAMUSCULAR | Status: DC | PRN
  Administered 2019-07-25: 10 mg via INTRAVENOUS

## 2019-07-25 MED ORDER — ROCURONIUM BROMIDE 10 MG/ML (PF) SYRINGE
PREFILLED_SYRINGE | INTRAVENOUS | Status: DC | PRN
  Administered 2019-07-25: 50 mg via INTRAVENOUS

## 2019-07-25 NOTE — Brief Op Note (Signed)
07/23/2019 - 07/25/2019  9:09 AM  PATIENT:  Mariam Dollar  63 y.o. male  PRE-OPERATIVE DIAGNOSIS:  obstructive jaundice    Pancreatic mass  POST-OPERATIVE DIAGNOSIS:  pancreatic mass  PROCEDURE:  Procedure(s): ESOPHAGOGASTRODUODENOSCOPY (EGD) BIOPSY  SURGEON:  Surgeon(s) and Role:    Ronnette Juniper, MD - Primary  PHYSICIAN ASSISTANT:   ASSISTANTS: Shara Blazing  ANESTHESIA:   MAC  EBL:  Minimal  BLOOD ADMINISTERED:none  DRAINS: none   LOCAL MEDICATIONS USED:  NONE  SPECIMEN:  Biopsy / Limited Resection  DISPOSITION OF SPECIMEN:  PATHOLOGY  COUNTS:  YES  TOURNIQUET:  * No tourniquets in log *  DICTATION: .Dragon Dictation  PLAN OF CARE: Admit to inpatient   PATIENT DISPOSITION:  PACU - hemodynamically stable.   Delay start of Pharmacological VTE agent (>24hrs) due to surgical blood loss or risk of bleeding: yes

## 2019-07-25 NOTE — Anesthesia Procedure Notes (Signed)
Procedure Name: Intubation Date/Time: 07/25/2019 8:25 AM Performed by: Clearnce Sorrel, CRNA Pre-anesthesia Checklist: Patient identified, Emergency Drugs available, Suction available, Patient being monitored and Timeout performed Patient Re-evaluated:Patient Re-evaluated prior to induction Oxygen Delivery Method: Circle system utilized Preoxygenation: Pre-oxygenation with 100% oxygen Induction Type: IV induction Ventilation: Mask ventilation without difficulty and Oral airway inserted - appropriate to patient size Laryngoscope Size: Mac and 4 Grade View: Grade I Tube type: Oral Tube size: 7.5 mm Number of attempts: 1 Airway Equipment and Method: Stylet Placement Confirmation: ETT inserted through vocal cords under direct vision,  positive ETCO2 and breath sounds checked- equal and bilateral Secured at: 23 cm Tube secured with: Tape Dental Injury: Teeth and Oropharynx as per pre-operative assessment

## 2019-07-25 NOTE — Transfer of Care (Signed)
Immediate Anesthesia Transfer of Care Note  Patient: Jeffrey Harris  Procedure(s) Performed: ESOPHAGOGASTRODUODENOSCOPY (EGD) BIOPSY  Patient Location: Endoscopy Unit  Anesthesia Type:General  Level of Consciousness: awake, alert  and oriented  Airway & Oxygen Therapy: Patient Spontanous Breathing  Post-op Assessment: Report given to RN and Post -op Vital signs reviewed and stable  Post vital signs: Reviewed and stable  Last Vitals:  Vitals Value Taken Time  BP 133/75 07/25/19 0915  Temp 35.9 C 07/25/19 0915  Pulse 74 07/25/19 0918  Resp 9 07/25/19 0918  SpO2 100 % 07/25/19 0918  Vitals shown include unvalidated device data.  Last Pain:  Vitals:   07/25/19 0915  TempSrc: Temporal  PainSc: 0-No pain         Complications: No apparent anesthesia complications

## 2019-07-25 NOTE — Interval H&P Note (Signed)
History and Physical Interval Note: 62/male with obstructive jaundice, diffuse biliary ductal dilation, pancreatic head mass, likely liver metastasis for an ERCP with metal stent placement and brush cytology.  07/25/2019 8:10 AM  Jeffrey Harris  has presented today for ERCP, with the diagnosis of obstructive jaundice    Pancreatic mass.  The various methods of treatment have been discussed with the patient and family. After consideration of risks, benefits and other options for treatment, the patient has consented to  Procedure(s): ENDOSCOPIC RETROGRADE CHOLANGIOPANCREATOGRAPHY (ERCP) WITH PROPOFOL (N/A) as a surgical intervention.  The patient's history has been reviewed, patient examined, no change in status, stable for surgery.  I have reviewed the patient's chart and labs.  Questions were answered to the patient's satisfaction.     Ronnette Juniper

## 2019-07-25 NOTE — Op Note (Addendum)
[facility] Patient Name: Jeffrey Harris Procedure Date : 07/25/2019 MRN: [id number] Attending MD: Ronnette Juniper , MD Date of Birth: 24-Jan-1956 CSN: [id number] Age: 63 Admit Type: Inpatient Procedure:                Upper GI endoscopy Indications:              Abnormal CT of the GI tract, obstructive jaundice,                            pancreatic head mass with liver masses likley                            metastasis Providers:                Ronnette Juniper, MD, Burtis Junes, RN, Laverda Sorenson,                            Technician, Clearnce Sorrel, CRNA Referring MD:             Merlene Morse Medicines:                Monitored Anesthesia Care Complications:            No immediate complications. Estimated blood loss:                            Minimal. Estimated Blood Loss:     Estimated blood loss was minimal. Procedure:                Pre-Anesthesia Assessment:                           - Prior to the procedure, a History and Physical                            was performed, and patient medications and                            allergies were reviewed. The patient's tolerance of                            previous anesthesia was also reviewed. The risks                            and benefits of the procedure and the sedation                            options and risks were discussed with the patient.                            All questions were answered, and informed consent                            was obtained. Prior Anticoagulants: The patient has  taken no previous anticoagulant or antiplatelet                            agents. ASA Grade Assessment: III - A patient with                            severe systemic disease. After reviewing the risks                            and benefits, the patient was deemed in                            satisfactory condition to undergo the procedure.                           After obtaining informed  consent, the endoscope was                            passed under direct vision. Throughout the                            procedure, the patient's blood pressure, pulse, and                            oxygen saturations were monitored continuously. The                            [id number] Olympus duodenoscope was                            introduced through the mouth, and advanced to the                            second part of duodenum. After obtaining informed                            consent, the endoscope was passed under direct                            vision. Throughout the procedure, the patient's                            blood pressure, pulse, and oxygen saturations were                            monitored continuously. The upper GI endoscopy was                            accomplished without difficulty. The patient                            tolerated the procedure well. Scope In: Scope Out: Findings:      The examined esophagus was normal.      The entire examined stomach  was normal.      A side viewing duodenoscope was used and detailed visualization of upper       GI tract was not done.      A large frond-like/villous, fungating, infiltrative and ulcerated mass       with pigmentation was found in the ampullary area. Biopsies were taken       with a cold forceps for histology. Intial attempt to canulate the CBD       was aborted as the wire seemed to advanced through the ulcerated mass       increasing risk of perforation. Impression:               - Normal esophagus.                           - Normal stomach.                           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 Biopsied. Moderate Sedation:      Patient did not receive moderate sedation for this procedure, but       instead received monitored anesthesia care. Recommendation:           - Await pathology results.                           - Refer to an interventional radiologist today for                             possible percutaneous transhepatic chloangiogram                            and stent placement to relieve jaundice and liver                            biopsy. Procedure Code(s):        --- Professional ---                           [phone], Esophagogastroduodenoscopy, flexible,                            transoral; with biopsy, single or multiple Diagnosis Code(s):        --- Professional ---                           K31.89, Other diseases of stomach and duodenum                           R93.3, Abnormal findings on diagnostic imaging of                            other parts of digestive tract CPT copyright 2019 [employer]. All rights reserved. The codes documented in this report are preliminary and upon coder review may  be revised to meet current compliance requirements. Ronnette Juniper, MD 07/25/2019 9:09:02 AM This report has been signed electronically. Number of Addenda: 0

## 2019-07-25 NOTE — Op Note (Signed)
ERCP was attempted for obstructive jaundice, pancreatic mass with multiple liver masses suspicious for metastases.  A large villous, fungating, infiltrative and ulcerated mass with pigmentation was found in the ampullary area.  Initial attempt at cannulation of CBD was aborted as the wire seemed to advance through the ulcerated mass increasing risk of perforation.  Biopsies were taken and the duodenal scope was withdrawn. Detailed examination of the upper GI tract was not done because a side-viewing duodenal scope was used for the procedure.  Recommendations: IR consult for percutaneous transhepatic cholangiogram and stent placement to relieve obstructive jaundice and possible liver biopsy at the same time. Discussed findings and recommendations with Dr. Loletha Carrow, who will consult IR and manage the patient while in hospital.   Jeffrey Juniper, MD

## 2019-07-25 NOTE — Progress Notes (Signed)
PROGRESS NOTE    Jeffrey Harris  [id number] DOB: [birth year] DOA: 07/23/2019 PCP: [name], MD   Brief Narrative:  Jeffrey Regal Adamsis a 63 y.o.malewith medical history significant ofgout.  Prediabetic.He presents with complaints of right upper quadrant fullness over the last 3 weeks.  Associated with loose stool/hemorrhoid.  Intentional weight loss of 30 pounds.  He has used Tylenol PM to help him sleep for the last 2 months but he is not taking more than the recommended amount.  Over the last day he noticed that his eyes were turning yellow and asked when he went to his primary care provider to do lab work. Lab work was noted to be abnormal and he was sent here to the emergency department for further evaluation.  ED Course: Hemodynamically stable,. Labs significant for lipase 297, alkaline phosphatase 300, AST 114, ALT 164, and total bilirubin 13.6.  Right upper quadrant ultrasound revealed Dilatation of the CBD up to 37mm and2 hypoechoic masses.   07/24/2019  - GI was consulted.Plan for possible MRCP/ERCP  Today -CT of abdomen pelvis was reviewed 2.8 cm pancreatic head, highly suspicious of neoplasm with metastatic lesions to liver.  07/25/2019 - Underwent ERCP with biopsy however unable to stent CBD. Plan for IR eval tomorrow  Assessment & Plan:   Active Problems:   Hyperbilirubinemia   Elevated liver enzymes   History of gout   Obesity (BMI 30-39.9)   Pancreatic mass  Pancreatic mass -Likely the cause of the patient's hyperbilirubinemia as well as elevated liver enzymes. -Concerning for adenocarcinoma of the pancreas.  Patient underwent ERCP today with biopsy taken although inability to stent common bile duct. CT abdomen pelvis: Reviewed, biliary duct dilatation,  2.8 cm soft tissue in the pancreatic head highly suspicious for pancreatic carcinoma  -Diffuse liver mets noted on CT. -Patient with planned procedure with IR given common bile duct blockage.   Patient will be n.p.o. at midnight. -Appreciate GI assistance. -We will follow CA 19-9.  Oncology is consulted and awaiting pathology results.  Hyperbilirubinemia -With associated upper quadrant abdominal pain, elevated liver enzymes, and jaundice secondary to mass above.  History of gout: -Continue allopurinol  Obesity -Stable.  Counseled on importance of diet and exercise.  Elevated liver enzymes -Secondary to above.  Will follow.   DVT prophylaxis: TEDs/SCDs, Will hold lovenox temporarily given likelihood of stent placement and possible liver biopsy tomorrow  Code Status: Full code Family Communication: Spoke with patient and his wife, Jeffrey Harris, at bedside Disposition Plan: D/C home pending pancreatic mass work up   Consultants:   Oncology  GI  IR  Procedures:   ERCP - See op note - large villous, fungating, infiltrative, and ulcerated mass  Antimicrobials:   none    Subjective: Patient seen post ERCP. He has no complaints currently, however states he is frustrated due to inability to have stent placed. He denies any current abdominal pain, N/V. He has been tolerating a diet. Denies CP, SOB, fever, chills.   Objective: Vitals:   07/25/19 0915 07/25/19 0920 07/25/19 0935 07/25/19 0955  BP: 133/75 131/76 132/73 (!) 144/79  Pulse: 79 83 66 63  Resp: 16 19 14 14   Temp: (!) 96.7 F (35.9 C)   (!) 97.4 F (36.3 C)  TempSrc: Temporal   Oral  SpO2: 99% 98% 98% 100%  Weight:      Height:        Intake/Output Summary (Last 24 hours) at 07/25/2019 1144 Last data filed at 07/25/2019 0908 Gross per  24 hour  Intake 1660 ml  Output -  Net 1660 ml   Filed Weights   07/25/19 0745  Weight: 107 kg    Examination:  General exam: Appears calm and comfortable  Respiratory system: Clear to auscultation. Respiratory effort normal. Cardiovascular system: S1 & S2 heard, RRR. No JVD, murmurs, rubs, gallops or clicks. No pedal edema. Gastrointestinal system: Abdomen is  nondistended, soft and mildly tender in RUQ. No organomegaly or masses felt. Normal bowel sounds heard. Central nervous system: Alert and oriented. No focal neurological deficits. Extremities: Symmetric 5 x 5 power. Skin: No rashes, lesions or ulcers Psychiatry: Judgement and insight appear normal. Mood & affect appropriate.   Data Reviewed: I have personally reviewed following labs and imaging studies  CBC: Recent Labs  Lab 07/23/19 1142 07/24/19 0154  WBC 4.6 4.9  NEUTROABS 3.0  --   HGB 13.7 12.4*  HCT 39.0 34.9*  MCV 97.3 96.4  PLT 228 [id number]   Basic Metabolic Panel: Recent Labs  Lab 07/23/19 1142 07/24/19 0154 07/25/19 0231  NA 134* 133* 134*  K 3.8 4.2 4.2  CL 99 99 101  CO2 25 24 22   GLUCOSE 125* 131* 130*  BUN 7 6 5*  CREATININE 0.72 0.68 0.79  CALCIUM 9.3 8.7* 8.5*   GFR: Estimated Creatinine Clearance: 122.2 mL/min (by C-G formula based on SCr of 0.79 mg/dL). Liver Function Tests: Recent Labs  Lab 07/23/19 1142 07/24/19 0154 07/25/19 0231  AST 114* 95* 82*  ALT 164* 139* 130*  ALKPHOS 300* 242* 251*  BILITOT 13.6* 11.6* 11.7*  PROT 7.2 6.5 6.3*  ALBUMIN 3.9 3.3* 3.2*   Recent Labs  Lab 07/23/19 1142 07/25/19 0231  LIPASE 297* 781*   No results for input(s): AMMONIA in the last 168 hours. Coagulation Profile: Recent Labs  Lab 07/23/19 1100 07/25/19 0231  INR 1.1 1.2   Cardiac Enzymes: No results for input(s): CKTOTAL, CKMB, CKMBINDEX, TROPONINI in the last 168 hours. BNP (last 3 results) No results for input(s): PROBNP in the last 8760 hours. HbA1C: No results for input(s): HGBA1C in the last 72 hours. CBG: No results for input(s): GLUCAP in the last 168 hours. Lipid Profile: No results for input(s): CHOL, HDL, LDLCALC, TRIG, CHOLHDL, LDLDIRECT in the last 72 hours. Thyroid Function Tests: No results for input(s): TSH, T4TOTAL, FREET4, T3FREE, THYROIDAB in the last 72 hours. Anemia Panel: No results for input(s): VITAMINB12, FOLATE,  FERRITIN, TIBC, IRON, RETICCTPCT in the last 72 hours. Sepsis Labs: No results for input(s): PROCALCITON, LATICACIDVEN in the last 168 hours.  Recent Results (from the past 240 hour(s))  SARS CORONAVIRUS 2 (TAT 6-24 HRS) Nasopharyngeal Nasopharyngeal Swab     Status: None   Collection Time: 07/24/19  3:35 PM   Specimen: Nasopharyngeal Swab  Result Value Ref Range Status   SARS Coronavirus 2 NEGATIVE NEGATIVE Final    Comment: (NOTE) SARS-CoV-2 target nucleic acids are NOT DETECTED. The SARS-CoV-2 RNA is generally detectable in upper and lower respiratory specimens during the acute phase of infection. Negative results do not preclude SARS-CoV-2 infection, do not rule out co-infections with other pathogens, and should not be used as the sole basis for treatment or other patient management decisions. Negative results must be combined with clinical observations, patient history, and epidemiological information. The expected result is Negative. Fact Sheet for Patients: SugarRoll.be Fact Sheet for Healthcare Providers: https://www.woods-mathews.com/ This test is not yet approved or cleared by the [place] FDA and  has been authorized for detection and/or diagnosis  of SARS-CoV-2 by FDA under an Emergency Use Authorization (EUA). This EUA will remain  in effect (meaning this test can be used) for the duration of the COVID-19 declaration under Section 56 4(b)(1) of the Act, 21 U.S.C. section 360bbb-3(b)(1), unless the authorization is terminated or revoked sooner. Performed at [facility], [place] [address], [place],  [address]          Radiology Studies: Ct Chest W Contrast  Result Date: 07/24/2019 CLINICAL DATA:  Findings on previous day's abdomen and pelvis CT consistent with pancreatic carcinoma and metastatic disease. EXAM: CT CHEST WITH CONTRAST TECHNIQUE: Multidetector CT imaging of the chest was performed during  intravenous contrast administration. CONTRAST:  22mL OMNIPAQUE IOHEXOL 300 MG/ML  SOLN COMPARISON:  Abdomen and pelvis CT, 07/23/2019. FINDINGS: Cardiovascular: Heart is normal in size and configuration. No pericardial effusion 2 vessel coronary artery calcifications. Great vessels are normal in caliber. Tiny focus of atherosclerotic calcification along the descending thoracic aorta. Mediastinum/Nodes: Prominent thyroid gland with small calcifications, no defined nodule. No mediastinal or hilar masses. No enlarged lymph nodes. Trachea and esophagus are unremarkable. Lungs/Pleura: Lungs are clear. No pleural effusion or pneumothorax. Upper Abdomen: No acute finding or change from the previous day's abdominopelvic CT Musculoskeletal: No fracture or acute finding.  No bone lesion. IMPRESSION: 1. No acute findings in the chest. 2. No evidence of metastatic disease or a primary neoplasm in the chest. 3. Prominent thyroid gland with calcifications. Consider non urgent follow-up thyroid ultrasound further assessment. 4. Two vessel coronary artery calcifications and minimal aortic atherosclerosis. Aortic Atherosclerosis (ICD10-I70.0). Electronically Signed   By: Lajean Manes M.D.   On: 07/24/2019 16:54   Ct Abdomen Pelvis W Contrast  Result Date: 07/24/2019 CLINICAL DATA:  Painless jaundice for 1 week. Unintentional weight loss over past 3 months. EXAM: CT ABDOMEN AND PELVIS WITH CONTRAST TECHNIQUE: Multidetector CT imaging of the abdomen and pelvis was performed using the standard protocol following bolus administration of intravenous contrast. CONTRAST:  173mL OMNIPAQUE IOHEXOL 300 MG/ML  SOLN COMPARISON:  None. FINDINGS: Lower Chest: No acute findings. Hepatobiliary: Multiple small hypovascular lesions are seen throughout the right and left lobes. Largest lesion is seen in segment 4A, measuring 2.1 x 1.5 cm on image 12/3. These are consistent with diffuse pulmonary metastases. The gallbladder is distended, however  there are no signs of acute cholecystitis. Diffuse biliary ductal dilatation is seen to the level of the pancreatic head. Pancreas: Subtle ill-defined soft tissue prominence is seen in the pancreatic head which measures 2.8 x 2.6 cm, and is suspicious for pancreatic carcinoma. No evidence of pancreatic ductal dilatation or peripancreatic inflammatory changes. Spleen: Within normal limits in size and appearance. Adrenals/Urinary Tract: A 2.4 cm fat attenuation mass is seen in the left adrenal gland, consistent with a benign myelolipoma. The right adrenal gland and left kidney are normal appearance. Right-sided pelvic kidney is noted which is otherwise unremarkable in appearance. No evidence of renal mass or hydronephrosis. Unremarkable unopacified urinary bladder. Stomach/Bowel: No evidence of obstruction, inflammatory process or abnormal fluid collections. Normal appendix visualized. Vascular/Lymphatic: A 1.3 cm peripancreatic lymph node is seen just posterior to the portal venous confluence on image 19/3. A 1.4 cm porta hepatis lymph node is seen on image 17/3. 8 mm lymph node is also seen adjacent to the GE junction on image 12/13. No other pathologically enlarged lymph nodes are identified. There is no evidence of vascular involvement of SMA, SMV, or portal vein. Reproductive:  No mass or other significant  abnormality. Other:  None. Musculoskeletal:  No suspicious bone lesions identified. IMPRESSION: 1. Diffuse biliary ductal dilatation, with approximately 2.8 cm soft tissue prominence in the pancreatic head, highly suspicious for primary pancreatic carcinoma. Recommend abdomen MRI and MRCP without and with contrast for further evaluation. 2. Diffuse liver metastases. 3. Mild peripancreatic and porta hepatis lymphadenopathy, suspicious for metastatic disease. 4. 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 Electronically Signed   By: Marlaine Hind M.D.   On: 07/24/2019 07:50   Dg Ercp  Biliary & Pancreatic Ducts  Result Date: 07/25/2019 CLINICAL DATA:  Obstructive jaundice, concern for pancreas mass EXAM: ERCP unsuccessful TECHNIQUE: Multiple spot images obtained with the fluoroscopic device and submitted for interpretation post-procedure. FLUOROSCOPY TIME:  Fluoroscopy Time:  52 seconds Number of Acquired Spot Images: 1 COMPARISON:  07/23/2019 FINDINGS: Single spot fluoroscopic view demonstrates the endoscope. Biliary tree not opacified. IMPRESSION: Nonvisualization of the biliary tree.  Unsuccessful ERCP. These images were submitted for radiologic interpretation only. Please see the procedural report for the amount of contrast and the fluoroscopy time utilized. Electronically Signed   By: Jerilynn Mages.  Shick M.D.   On: 07/25/2019 09:32        Scheduled Meds: . allopurinol  300 mg Oral Daily  . pantoprazole (PROTONIX) IV  40 mg Intravenous Q12H  . sodium chloride flush  3 mL Intravenous Q12H   Continuous Infusions: . sodium chloride 125 mL/hr at 07/24/19 1457     LOS: 2 days    Arlan Organ, MD [facility]

## 2019-07-25 NOTE — Anesthesia Postprocedure Evaluation (Signed)
Anesthesia Post Note  Patient: Jeffrey Harris  Procedure(s) Performed: ESOPHAGOGASTRODUODENOSCOPY (EGD) BIOPSY     Patient location during evaluation: PACU Anesthesia Type: General Level of consciousness: awake and alert Pain management: pain level controlled Vital Signs Assessment: post-procedure vital signs reviewed and stable Respiratory status: spontaneous breathing, nonlabored ventilation, respiratory function stable and patient connected to nasal cannula oxygen Cardiovascular status: blood pressure returned to baseline and stable Postop Assessment: no apparent nausea or vomiting Anesthetic complications: no    Last Vitals:  Vitals:   07/25/19 0955 07/25/19 1317  BP: (!) 144/79 (!) 138/92  Pulse: 63 82  Resp: 14 14  Temp: (!) 36.3 C 36.5 C  SpO2: 100% 98%    Last Pain:  Vitals:   07/25/19 1317  TempSrc: Oral  PainSc:                  Dheeraj Hail COKER

## 2019-07-25 NOTE — Consult Note (Signed)
Chief Complaint: Patient was seen in consultation today for pancreatic mass  Referring Physician(s): Dr. Loletha Carrow  Supervising Physician: Daryll Brod  Patient Status: [facility] - In-pt  History of Present Illness: Jeffrey Harris is a 63 y.o. male past medical history of gout, prediabetes presented to his PCP due to yellowing eyes and skin.  Preliminary lab work was abnormal and he was referred to the ED for further evaluation.   T bili 13.6, AST 114, ALT 164, lipase 297  CT Abdomen Pelvis 11/19 showed: 1. Diffuse biliary ductal dilatation, with approximately 2.8 cm soft tissue prominence in the pancreatic head, highly suspicious for primary pancreatic carcinoma. Recommend abdomen MRI and MRCP without and with contrast for further evaluation. 2. Diffuse liver metastases. 3. Mild peripancreatic and porta hepatis lymphadenopathy, suspicious for metastatic disease. 4. 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 Procedure was stopped and IR consulted for PTC with biliary drain placement.   Case reviewed and approved by Dr. Annamaria Boots.   Patient assessed at bedside. Denies nausea, vomiting, abdominal pain. States he had some bloating at home.  His skin is jaundiced.  He is alert, oriented, and clearly able to participate in conversation and medical decision making after procedure this AM.  Wife at bedside.     Past Medical History:  Diagnosis Date   Gout     Past Surgical History:  Procedure Laterality Date   KNEE ARTHROSCOPY      Allergies: Patient has no known allergies.  Medications: Prior to Admission medications   Medication Sig Start Date End Date Taking? Authorizing Provider  allopurinol (ZYLOPRIM) 300 MG tablet Take 300 mg by mouth daily.   Yes [provider]     Family History  Problem  Relation Age of Onset   Diabetes Father     Social History   Socioeconomic History   Marital status: Married    Spouse name: Not on file   Number of children: Not on file   Years of education: Not on file   Highest education level: Not on file  Occupational History   Not on file  Social Needs   Financial resource strain: Not on file   Food insecurity    Worry: Not on file    Inability: Not on file   Transportation needs    Medical: Not on file    Non-medical: Not on file  Tobacco Use   Smoking status: Never Smoker   Smokeless tobacco: Former Systems developer    Types: Snuff  Substance and Sexual Activity   Alcohol use: No   Drug use: No   Sexual activity: Never  Lifestyle   Physical activity    Days per week: Not on file    Minutes per session: Not on file   Stress: Not on file  Relationships   Social connections    Talks on phone: Not on file    Gets together: Not on file    Attends religious service: Not on file    Active member of club or organization: Not on file    Attends meetings of clubs or organizations: Not on file    Relationship status: Not on file  Other Topics Concern   Not on file  Social History Narrative   Not on file     Review of Systems: A 12 point  ROS discussed and pertinent positives are indicated in the HPI above.  All other systems are negative.  Review of Systems  Constitutional: Negative for fatigue and fever.  Respiratory: Negative for cough and shortness of breath.   Cardiovascular: Negative for chest pain.  Gastrointestinal: Positive for diarrhea. Negative for abdominal pain, nausea and vomiting.  Genitourinary: Negative for dysuria.  Musculoskeletal: Negative for back pain.  Psychiatric/Behavioral: Negative for behavioral problems and confusion.    Vital Signs: BP (!) 138/92 (BP Location: Right Arm)    Pulse 82    Temp 97.7 F (36.5 C) (Oral)    Resp 14    Ht 5' 11" (1.803 m)    Wt 236 lb (107 kg)    SpO2 98%    BMI  32.92 kg/m   Physical Exam Vitals signs and nursing note reviewed.  HENT:     Mouth/Throat:     Mouth: Mucous membranes are moist.     Pharynx: Oropharynx is clear.  Eyes:     General: Scleral icterus present.  Cardiovascular:     Rate and Rhythm: Normal rate and regular rhythm.  Pulmonary:     Effort: Pulmonary effort is normal. No respiratory distress.     Breath sounds: Normal breath sounds.  Abdominal:     General: Abdomen is flat. There is no distension.     Palpations: Abdomen is soft.  Skin:    General: Skin is warm and dry.     Coloration: Skin is jaundiced.  Neurological:     General: No focal deficit present.     Mental Status: He is alert and oriented to person, place, and time.  Psychiatric:        Mood and Affect: Mood normal.        Behavior: Behavior normal.        Thought Content: Thought content normal.        Judgment: Judgment normal.      MD Evaluation Airway: WNL Heart: WNL Abdomen: WNL Chest/ Lungs: WNL ASA  Classification: 3 Mallampati/Airway Score: One   Imaging: Ct Chest W Contrast  Result Date: 07/24/2019 CLINICAL DATA:  Findings on previous day's abdomen and pelvis CT consistent with pancreatic carcinoma and metastatic disease. EXAM: CT CHEST WITH CONTRAST TECHNIQUE: Multidetector CT imaging of the chest was performed during intravenous contrast administration. CONTRAST:  69m OMNIPAQUE IOHEXOL 300 MG/ML  SOLN COMPARISON:  Abdomen and pelvis CT, 07/23/2019. FINDINGS: Cardiovascular: Heart is normal in size and configuration. No pericardial effusion 2 vessel coronary artery calcifications. Great vessels are normal in caliber. Tiny focus of atherosclerotic calcification along the descending thoracic aorta. Mediastinum/Nodes: Prominent thyroid gland with small calcifications, no defined nodule. No mediastinal or hilar masses. No enlarged lymph nodes. Trachea and esophagus are unremarkable. Lungs/Pleura: Lungs are clear. No pleural effusion or  pneumothorax. Upper Abdomen: No acute finding or change from the previous day's abdominopelvic CT Musculoskeletal: No fracture or acute finding.  No bone lesion. IMPRESSION: 1. No acute findings in the chest. 2. No evidence of metastatic disease or a primary neoplasm in the chest. 3. Prominent thyroid gland with calcifications. Consider non urgent follow-up thyroid ultrasound further assessment. 4. Two vessel coronary artery calcifications and minimal aortic atherosclerosis. Aortic Atherosclerosis (ICD10-I70.0). Electronically Signed   By: DLajean ManesM.D.   On: 07/24/2019 16:54   Ct Abdomen Pelvis W Contrast  Result Date: 07/24/2019 CLINICAL DATA:  Painless jaundice for 1 week. Unintentional weight loss over past 3 months. EXAM: CT ABDOMEN AND PELVIS WITH CONTRAST  TECHNIQUE: Multidetector CT imaging of the abdomen and pelvis was performed using the standard protocol following bolus administration of intravenous contrast. CONTRAST:  159m OMNIPAQUE IOHEXOL 300 MG/ML  SOLN COMPARISON:  None. FINDINGS: Lower Chest: No acute findings. Hepatobiliary: Multiple small hypovascular lesions are seen throughout the right and left lobes. Largest lesion is seen in segment 4A, measuring 2.1 x 1.5 cm on image 12/3. These are consistent with diffuse pulmonary metastases. The gallbladder is distended, however there are no signs of acute cholecystitis. Diffuse biliary ductal dilatation is seen to the level of the pancreatic head. Pancreas: Subtle ill-defined soft tissue prominence is seen in the pancreatic head which measures 2.8 x 2.6 cm, and is suspicious for pancreatic carcinoma. No evidence of pancreatic ductal dilatation or peripancreatic inflammatory changes. Spleen: Within normal limits in size and appearance. Adrenals/Urinary Tract: A 2.4 cm fat attenuation mass is seen in the left adrenal gland, consistent with a benign myelolipoma. The right adrenal gland and left kidney are normal appearance. Right-sided pelvic  kidney is noted which is otherwise unremarkable in appearance. No evidence of renal mass or hydronephrosis. Unremarkable unopacified urinary bladder. Stomach/Bowel: No evidence of obstruction, inflammatory process or abnormal fluid collections. Normal appendix visualized. Vascular/Lymphatic: A 1.3 cm peripancreatic lymph node is seen just posterior to the portal venous confluence on image 19/3. A 1.4 cm porta hepatis lymph node is seen on image 17/3. 8 mm lymph node is also seen adjacent to the GE junction on image 12/13. No other pathologically enlarged lymph nodes are identified. There is no evidence of vascular involvement of SMA, SMV, or portal vein. Reproductive:  No mass or other significant abnormality. Other:  None. Musculoskeletal:  No suspicious bone lesions identified. IMPRESSION: 1. Diffuse biliary ductal dilatation, with approximately 2.8 cm soft tissue prominence in the pancreatic head, highly suspicious for primary pancreatic carcinoma. Recommend abdomen MRI and MRCP without and with contrast for further evaluation. 2. Diffuse liver metastases. 3. Mild peripancreatic and porta hepatis lymphadenopathy, suspicious for metastatic disease. 4. 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 Electronically Signed   By: JMarlaine HindM.D.   On: 07/24/2019 07:50   Dg Ercp Biliary & Pancreatic Ducts  Result Date: 07/25/2019 CLINICAL DATA:  Obstructive jaundice, concern for pancreas mass EXAM: ERCP unsuccessful TECHNIQUE: Multiple spot images obtained with the fluoroscopic device and submitted for interpretation post-procedure. FLUOROSCOPY TIME:  Fluoroscopy Time:  52 seconds Number of Acquired Spot Images: 1 COMPARISON:  07/23/2019 FINDINGS: Single spot fluoroscopic view demonstrates the endoscope. Biliary tree not opacified. IMPRESSION: Nonvisualization of the biliary tree.  Unsuccessful ERCP. These images were submitted for radiologic interpretation only. Please see the procedural  report for the amount of contrast and the fluoroscopy time utilized. Electronically Signed   By: MJerilynn Mages  Shick M.D.   On: 07/25/2019 09:32   UKoreaAbdomen Limited Ruq  Result Date: 07/23/2019 CLINICAL DATA:  Abdominal discomfort and jaundice EXAM: ULTRASOUND ABDOMEN LIMITED RIGHT UPPER QUADRANT COMPARISON:  None. FINDINGS: Gallbladder: No gallstones or wall thickening visualized. No sonographic Murphy sign noted by sonographer. Common bile duct: Diameter: 9 mm Liver: Heterogeneous echotexture. Ill-defined 2.1 x 2.1 x 1.5 cm hypoechoic area in the right lobe and 1.7 x 1.6 x 1.5 cm hypoechoic area in the left lobe. Portal vein is patent on color Doppler imaging with normal direction of blood flow towards the liver. Other: None. IMPRESSION: Dilatation of the common bile duct. Heterogeneous appearance of the liver with two nonspecific ill-defined hypoechoic areas. No evidence of acute cholecystitis.  Electronically Signed   By: Macy Mis M.D.   On: 07/23/2019 11:23    Labs:  CBC: Recent Labs    07/23/19 1142 07/24/19 0154  WBC 4.6 4.9  HGB 13.7 12.4*  HCT 39.0 34.9*  PLT 228 197    COAGS: Recent Labs    07/23/19 1100 07/25/19 0231  INR 1.1 1.2    BMP: Recent Labs    07/23/19 1142 07/24/19 0154 07/25/19 0231  NA 134* 133* 134*  K 3.8 4.2 4.2  CL 99 99 101  CO2 _0 GLUCOSE 125* 131* 130*  BUN 7 6 5*  CALCIUM 9.3 8.7* 8.5*  CREATININE 0.72 0.68 0.79  GFRNONAA >60 >60 >60  GFRAA >60 >60 >60    LIVER FUNCTION TESTS: Recent Labs    07/23/19 1142 07/24/19 0154 07/25/19 0231  BILITOT 13.6* 11.6* 11.7*  AST 114* 95* 82*  ALT 164* 139* 130*  ALKPHOS 300* 242* 251*  PROT 7.2 6.5 6.3*  ALBUMIN 3.9 3.3* 3.2*    TUMOR MARKERS: No results for input(s): AFPTM, CEA, CA199, CHROMGRNA in the last 8760 hours.  Assessment and Plan: Pancreatic mass, jaundice Patient s/p ERCP this AM, however obstructive mass present and CBD unable to be cannulated.  IR consulted for  internal/external biliary drain placement.  Tbili 11.7 this AM (13.6 on admission).  Patient overall denies symptoms, however he is jaundiced with scleral icterus.  He is agreeable to [facility] with drain placement.  Wife at bedside.  NPO p MN.  Risks and benefits of biliary drain placement discussed with the patient including, but not limited to bleeding, infection which may lead to sepsis or even death and damage to adjacent structures.  This interventional procedure involves the use of X-rays and because of the nature of the planned procedure, it is possible that we will have prolonged use of X-ray fluoroscopy.  Potential radiation risks to you include (but are not limited to) the following: - A slightly elevated risk for cancer  several years later in life. This risk is typically less than 0.5% percent. This risk is low in comparison to the normal incidence of human cancer, which is 33% for women and 50% for men according to the [place]. - Radiation induced injury can include skin redness, resembling a rash, tissue breakdown / ulcers and hair loss (which can be temporary or permanent).   The likelihood of either of these occurring depends on the difficulty of the procedure and whether you are sensitive to radiation due to previous procedures, disease, or genetic conditions.   IF your procedure requires a prolonged use of radiation, you will be notified and given written instructions for further action.  It is your responsibility to monitor the irradiated area for the 2 weeks following the procedure and to notify your physician if you are concerned that you have suffered a radiation induced injury.    All of the patient's questions were answered, patient is agreeable to proceed.  Consent signed and in chart.  Thank you for this interesting consult.  I greatly enjoyed meeting ELIGHA KMETZ and look forward to participating in their care.  A copy of this report was sent to the  requesting provider on this date.  Electronically Signed: Docia Barrier, PA 07/25/2019, 1:45 PM   I spent a total of 40 Minutes    in face to face in clinical consultation, greater than 50% of which was counseling/coordinating care for pancreatic mass.

## 2019-07-25 NOTE — Anesthesia Preprocedure Evaluation (Signed)
Anesthesia Evaluation  Patient identified by MRN, date of birth, ID band Patient awake    Reviewed: Allergy & Precautions, NPO status , Patient's Chart, lab work & pertinent test results  Airway Mallampati: II  TM Distance: >3 FB Neck ROM: Full    Dental  (+) Teeth Intact, Dental Advisory Given   Pulmonary    breath sounds clear to auscultation       Cardiovascular  Rhythm:Regular Rate:Normal     Neuro/Psych    GI/Hepatic   Endo/Other    Renal/GU      Musculoskeletal   Abdominal   Peds  Hematology   Anesthesia Other Findings   Reproductive/Obstetrics                             Anesthesia Physical Anesthesia Plan  ASA: III  Anesthesia Plan: General   Post-op Pain Management:    Induction: Intravenous  PONV Risk Score and Plan: Ondansetron and Dexamethasone  Airway Management Planned: Oral ETT  Additional Equipment:   Intra-op Plan:   Post-operative Plan: Extubation in OR  Informed Consent: I have reviewed the patients History and Physical, chart, labs and discussed the procedure including the risks, benefits and alternatives for the proposed anesthesia with the patient or authorized representative who has indicated his/her understanding and acceptance.     Dental advisory given  Plan Discussed with: Anesthesiologist and CRNA  Anesthesia Plan Comments:         Anesthesia Quick Evaluation

## 2019-07-26 ENCOUNTER — Inpatient Hospital Stay (HOSPITAL_COMMUNITY): Payer: BC Managed Care – PPO

## 2019-07-26 ENCOUNTER — Encounter (HOSPITAL_COMMUNITY): Payer: Self-pay | Admitting: Interventional Radiology

## 2019-07-26 DIAGNOSIS — E871 Hypo-osmolality and hyponatremia: Secondary | ICD-10-CM

## 2019-07-26 HISTORY — PX: IR INT EXT BILIARY DRAIN WITH CHOLANGIOGRAM: IMG6044

## 2019-07-26 LAB — COMPREHENSIVE METABOLIC PANEL
ALT: 131 U/L — ABNORMAL HIGH (ref 0–44)
AST: 97 U/L — ABNORMAL HIGH (ref 15–41)
Albumin: 3.1 g/dL — ABNORMAL LOW (ref 3.5–5.0)
Alkaline Phosphatase: 270 U/L — ABNORMAL HIGH (ref 38–126)
Anion gap: 8 (ref 5–15)
BUN: 10 mg/dL (ref 6–20)
CO2: 22 mmol/L (ref 22–32)
Calcium: 8.6 mg/dL — ABNORMAL LOW (ref 8.9–10.3)
Chloride: 100 mmol/L (ref 98–111)
Creatinine, Ser: 0.76 mg/dL (ref 0.61–1.24)
GFR calc Af Amer: >60 mL/min (ref >60)
GFR calc non Af Amer: >60 mL/min (ref >60)
Glucose, Bld: 172 mg/dL — ABNORMAL HIGH (ref 70–99)
Potassium: 3.8 mmol/L (ref 3.5–5.1)
Sodium: 130 mmol/L — ABNORMAL LOW (ref 135–145)
Total Bilirubin: 11.3 mg/dL — ABNORMAL HIGH (ref 0.3–1.2)
Total Protein: 6.5 g/dL (ref 6.5–8.1)

## 2019-07-26 LAB — OSMOLALITY, URINE: Osmolality, Ur: 844 mOsm/kg (ref 300–900)

## 2019-07-26 LAB — PROTIME-INR
INR: 1.2 (ref 0.8–1.2)
Prothrombin Time: 15.4 seconds — ABNORMAL HIGH (ref 11.4–15.2)

## 2019-07-26 LAB — NA AND K (SODIUM & POTASSIUM), RAND UR
Potassium Urine: 31 mmol/L
Sodium, Ur: 113 mmol/L

## 2019-07-26 LAB — LIPASE, BLOOD: Lipase: 581 U/L — ABNORMAL HIGH (ref 11–51)

## 2019-07-26 LAB — OSMOLALITY: Osmolality: 286 mOsm/kg (ref 275–295)

## 2019-07-26 MED ORDER — MORPHINE SULFATE (PF) 4 MG/ML IV SOLN: 4.0000 mg | INTRAVENOUS | Status: DC | PRN

## 2019-07-26 MED ORDER — SODIUM CHLORIDE 0.9 % IV SOLN
INTRAVENOUS | Status: AC | PRN
  Administered 2019-07-26: 10 mL/h via INTRAVENOUS

## 2019-07-26 MED ORDER — PANTOPRAZOLE SODIUM 40 MG PO TBEC
40.0000 mg | DELAYED_RELEASE_TABLET | Freq: Two times a day (BID) | ORAL | Status: DC
  Administered 2019-07-26 – 2019-07-28 (×5): 40 mg via ORAL
  Filled 2019-07-26 (×7): qty 1

## 2019-07-26 MED ORDER — LIDOCAINE HCL 1 % IJ SOLN
INTRAMUSCULAR | Status: AC
  Filled 2019-07-26: qty 20

## 2019-07-26 MED ORDER — MIDAZOLAM HCL 2 MG/2ML IJ SOLN
INTRAMUSCULAR | Status: AC
  Filled 2019-07-26: qty 2

## 2019-07-26 MED ORDER — MORPHINE SULFATE (PF) 2 MG/ML IV SOLN
2.0000 mg | INTRAVENOUS | Status: DC | PRN
  Administered 2019-07-26: 2 mg via INTRAVENOUS
  Filled 2019-07-26: qty 1

## 2019-07-26 MED ORDER — SODIUM CHLORIDE 0.9% FLUSH
5.0000 mL | Freq: Three times a day (TID) | INTRAVENOUS | Status: DC
  Administered 2019-07-26 – 2019-07-29 (×10): 5 mL

## 2019-07-26 MED ORDER — FENTANYL CITRATE (PF) 100 MCG/2ML IJ SOLN
INTRAMUSCULAR | Status: AC
  Filled 2019-07-26: qty 2

## 2019-07-26 MED ORDER — IOHEXOL 300 MG/ML  SOLN
50.0000 mL | Freq: Once | INTRAMUSCULAR | Status: AC | PRN
  Administered 2019-07-26: 20 mL

## 2019-07-26 MED ORDER — OXYCODONE HCL 5 MG PO TABS
10.0000 mg | ORAL_TABLET | Freq: Four times a day (QID) | ORAL | Status: DC | PRN
  Administered 2019-07-26 – 2019-07-28 (×4): 10 mg via ORAL
  Filled 2019-07-26 (×4): qty 2

## 2019-07-26 MED ORDER — MIDAZOLAM HCL 2 MG/2ML IJ SOLN
INTRAMUSCULAR | Status: AC | PRN
  Administered 2019-07-26 (×2): 0.5 mg via INTRAVENOUS
  Administered 2019-07-26: 1 mg via INTRAVENOUS
  Administered 2019-07-26 (×2): 0.5 mg via INTRAVENOUS

## 2019-07-26 MED ORDER — FENTANYL CITRATE (PF) 100 MCG/2ML IJ SOLN
INTRAMUSCULAR | Status: AC | PRN
  Administered 2019-07-26: 50 ug via INTRAVENOUS
  Administered 2019-07-26 (×5): 25 ug via INTRAVENOUS

## 2019-07-26 MED ORDER — OXYCODONE HCL 5 MG PO TABS
5.0000 mg | ORAL_TABLET | Freq: Four times a day (QID) | ORAL | Status: DC | PRN
  Administered 2019-07-26: 5 mg via ORAL
  Filled 2019-07-26: qty 1

## 2019-07-26 NOTE — Progress Notes (Addendum)
PROGRESS NOTE    MALIKE FOGLIO  TKZ:[id number] DOB: Feb 09, 1956 DOA: 07/23/2019 PCP: [name], MD   Brief Narrative:  Okey Regal Adamsis a 63 y.o.malewith medical history significant ofgout.  Prediabetic.He presents with complaints of right upper quadrant fullness over the last 3 weeks.  Associated with loose stool/hemorrhoid.  Intentional weight loss of 30 pounds.  He has used Tylenol PM to help him sleep for the last 2 months but he is not taking more than the recommended amount.  Over the last day he noticed that his eyes were turning yellow and asked when he went to his primary care provider to do lab work. Lab work was noted to be abnormal and he was sent here to the emergency department for further evaluation.  ED Course: Hemodynamically stable,. Labs significant for lipase 297, alkaline phosphatase 300, AST 114, ALT 164, and total bilirubin 13.6.  Right upper quadrant ultrasound revealed Dilatation of the CBD up to 23m and2 hypoechoic masses.   07/24/2019  - GI was consulted.Plan for possible MRCP/ERCP  Today -CT of abdomen pelvis was reviewed 2.8 cm pancreatic head, highly suspicious of neoplasm with metastatic lesions to liver.  07/25/2019 - Underwent ERCP with biopsy however unable to stent CBD. Plan for IR eval tomorrow  07/26/2019 - S/P Rt PTC and int/ext drain through the duodenum by IR.   Assessment & Plan:   Active Problems:   Hyperbilirubinemia   Elevated liver enzymes   History of gout   Obesity (BMI 30-39.9)   Pancreatic mass   Elevated LFTs   Jaundice   Obstructive jaundice  Pancreatic mass -Likely the cause of the patient's hyperbilirubinemia as well as elevated liver enzymes. -Concerning for adenocarcinoma of the pancreas.  Patient underwent ERCP today with biopsy taken although inability to stent common bile duct. CT abdomen pelvis: Reviewed, biliary duct dilatation,  2.8 cm soft tissue in the pancreatic head highly suspicious for  pancreatic carcinoma  -Diffuse liver mets noted on CT. -Patient with planned procedure with IR given common bile duct blockage.  Patient will be n.p.o. at midnight. -Appreciate GI assistance. -We will follow CA 19-9.  Oncology is consulted and awaiting pathology results. - IR to place biliary drain today  Hyperbilirubinemia -With associated upper quadrant abdominal pain, elevated liver enzymes, and jaundice secondary to mass above.  History of gout: -Continue allopurinol  Obesity -Stable.  Counseled on importance of diet and exercise.  Elevated liver enzymes -Secondary to above.  Will follow.  Hyponatremia Sodium of 130. Slightly decreased from yesterday Will add urine osm, urine sodium/potassium, as well as serum osm for further work up.  Sodium lower after being NPO overnight which seems less likely to be related to SIADH, possibly related to mild volume depletion. Will allow regular diet and monitor with repeat BMP in AM   DVT prophylaxis: TEDs/SCDs, Will hold lovenox temporarily. Can likely restart tomorrow Code Status: Full code Family Communication: No family at bedside this morning Disposition Plan: D/C home pending pancreatic mass work up  Consultants:   Oncology  GI  IR  Procedures:   ERCP - See op note - large villous, fungating, infiltrative, and ulcerated mass  Antimicrobials:   none   Subjective: Patient seen this morning prior to biliary drain. He has no complaints currently and is hopeful a drain will be able to be placed. He denies any current abdominal pain, N/V. He has been tolerating a diet although he is currently NPO. Denies CP, SOB, fever, chills.   Objective: Vitals:  07/26/19 1020 07/26/19 1025 07/26/19 1030 07/26/19 1043  BP: 107/73 90/69 102/86 112/74  Pulse: (!) 52 (!) 58 (!) 59 61  Resp: 17 16 14 16   Temp:      TempSrc:      SpO2: 98% 97% 98% 97%  Weight:      Height:        Intake/Output Summary (Last 24 hours) at 07/26/2019  1127 Last data filed at 07/26/2019 0800 Gross per 24 hour  Intake 240 ml  Output --  Net 240 ml   Filed Weights   07/25/19 0745  Weight: 107 kg    Examination:  General exam: Appears calm and comfortable  Respiratory system: Clear to auscultation. Respiratory effort normal. Cardiovascular system: S1 & S2 heard, RRR. No JVD, murmurs, rubs, gallops or clicks. No pedal edema. Gastrointestinal system: Abdomen is nondistended, soft and mildly tender in RUQ. No organomegaly or masses felt. Normal bowel sounds heard. Central nervous system: Alert and oriented. No focal neurological deficits. Extremities: No cyanosis or edema Skin: No rashes, lesions or ulcers Psychiatry: Judgement and insight appear normal. Mood & affect appropriate.   Data Reviewed: I have personally reviewed following labs and imaging studies  CBC: Recent Labs  Lab 07/23/19 1142 07/24/19 0154  WBC 4.6 4.9  NEUTROABS 3.0  --   HGB 13.7 12.4*  HCT 39.0 34.9*  MCV 97.3 96.4  PLT 228 384   Basic Metabolic Panel: Recent Labs  Lab 07/23/19 1142 07/24/19 0154 07/25/19 0231 07/26/19 0250  NA 134* 133* 134* 130*  K 3.8 4.2 4.2 3.8  CL 99 99 101 100  CO2 25 24 22 22   GLUCOSE 125* 131* 130* 172*  BUN 7 6 5* 10  CREATININE 0.72 0.68 0.79 0.76  CALCIUM 9.3 8.7* 8.5* 8.6*   GFR: Estimated Creatinine Clearance: 122.2 mL/min (by C-G formula based on SCr of 0.76 mg/dL). Liver Function Tests: Recent Labs  Lab 07/23/19 1142 07/24/19 0154 07/25/19 0231 07/26/19 0250  AST 114* 95* 82* 97*  ALT 164* 139* 130* 131*  ALKPHOS 300* 242* 251* 270*  BILITOT 13.6* 11.6* 11.7* 11.3*  PROT 7.2 6.5 6.3* 6.5  ALBUMIN 3.9 3.3* 3.2* 3.1*   Recent Labs  Lab 07/23/19 1142 07/25/19 0231 07/26/19 0250  LIPASE 297* 781* 581*   No results for input(s): AMMONIA in the last 168 hours. Coagulation Profile: Recent Labs  Lab 07/23/19 1100 07/25/19 0231 07/26/19 0250  INR 1.1 1.2 1.2   Cardiac Enzymes: No results  for input(s): CKTOTAL, CKMB, CKMBINDEX, TROPONINI in the last 168 hours. BNP (last 3 results) No results for input(s): PROBNP in the last 8760 hours. HbA1C: No results for input(s): HGBA1C in the last 72 hours. CBG: No results for input(s): GLUCAP in the last 168 hours. Lipid Profile: No results for input(s): CHOL, HDL, LDLCALC, TRIG, CHOLHDL, LDLDIRECT in the last 72 hours. Thyroid Function Tests: No results for input(s): TSH, T4TOTAL, FREET4, T3FREE, THYROIDAB in the last 72 hours. Anemia Panel: No results for input(s): VITAMINB12, FOLATE, FERRITIN, TIBC, IRON, RETICCTPCT in the last 72 hours. Sepsis Labs: No results for input(s): PROCALCITON, LATICACIDVEN in the last 168 hours.  Recent Results (from the past 240 hour(s))  SARS CORONAVIRUS 2 (TAT 6-24 HRS) Nasopharyngeal Nasopharyngeal Swab     Status: None   Collection Time: 07/24/19  3:35 PM   Specimen: Nasopharyngeal Swab  Result Value Ref Range Status   SARS Coronavirus 2 NEGATIVE NEGATIVE Final    Comment: (NOTE) SARS-CoV-2 target nucleic acids are NOT  DETECTED. The SARS-CoV-2 RNA is generally detectable in upper and lower respiratory specimens during the acute phase of infection. Negative results do not preclude SARS-CoV-2 infection, do not rule out co-infections with other pathogens, and should not be used as the sole basis for treatment or other patient management decisions. Negative results must be combined with clinical observations, patient history, and epidemiological information. The expected result is Negative. Fact Sheet for Patients: SugarRoll.be Fact Sheet for Healthcare Providers: https://www.woods-mathews.com/ This test is not yet approved or cleared by the [place] FDA and  has been authorized for detection and/or diagnosis of SARS-CoV-2 by FDA under an Emergency Use Authorization (EUA). This EUA will remain  in effect (meaning this test can be used) for the  duration of the COVID-19 declaration under Section 56 4(b)(1) of the Act, 21 U.S.C. section 360bbb-3(b)(1), unless the authorization is terminated or revoked sooner. Performed at [facility], [place] [address], [place], [place] [address]          Radiology Studies: Ct Chest W Contrast  Result Date: 07/24/2019 CLINICAL DATA:  Findings on previous day's abdomen and pelvis CT consistent with pancreatic carcinoma and metastatic disease. EXAM: CT CHEST WITH CONTRAST TECHNIQUE: Multidetector CT imaging of the chest was performed during intravenous contrast administration. CONTRAST:  16m OMNIPAQUE IOHEXOL 300 MG/ML  SOLN COMPARISON:  Abdomen and pelvis CT, 07/23/2019. FINDINGS: Cardiovascular: Heart is normal in size and configuration. No pericardial effusion 2 vessel coronary artery calcifications. Great vessels are normal in caliber. Tiny focus of atherosclerotic calcification along the descending thoracic aorta. Mediastinum/Nodes: Prominent thyroid gland with small calcifications, no defined nodule. No mediastinal or hilar masses. No enlarged lymph nodes. Trachea and esophagus are unremarkable. Lungs/Pleura: Lungs are clear. No pleural effusion or pneumothorax. Upper Abdomen: No acute finding or change from the previous day's abdominopelvic CT Musculoskeletal: No fracture or acute finding.  No bone lesion. IMPRESSION: 1. No acute findings in the chest. 2. No evidence of metastatic disease or a primary neoplasm in the chest. 3. Prominent thyroid gland with calcifications. Consider non urgent follow-up thyroid ultrasound further assessment. 4. Two vessel coronary artery calcifications and minimal aortic atherosclerosis. Aortic Atherosclerosis (ICD10-I70.0). Electronically Signed   By: DLajean ManesM.D.   On: 07/24/2019 16:54   Dg Ercp Biliary & Pancreatic Ducts  Result Date: 07/25/2019 CLINICAL DATA:  Obstructive jaundice, concern for pancreas mass EXAM: ERCP unsuccessful TECHNIQUE: Multiple  spot images obtained with the fluoroscopic device and submitted for interpretation post-procedure. FLUOROSCOPY TIME:  Fluoroscopy Time:  52 seconds Number of Acquired Spot Images: 1 COMPARISON:  07/23/2019 FINDINGS: Single spot fluoroscopic view demonstrates the endoscope. Biliary tree not opacified. IMPRESSION: Nonvisualization of the biliary tree.  Unsuccessful ERCP. These images were submitted for radiologic interpretation only. Please see the procedural report for the amount of contrast and the fluoroscopy time utilized. Electronically Signed   By: MJerilynn Mages  Shick M.D.   On: 07/25/2019 09:32   Ir Int ELianne CureBiliary Drain With Cholangiogram  Result Date: 07/26/2019 INDICATION: Obstructive jaundice, elevated LFTs, and ampullary/pancreatic head mass, failed ERCP EXAM: ULTRASOUND AND FLUOROSCOPIC RIGHT PTC WITH INTERNAL EXTERNAL BILIARY DRAIN MEDICATIONS: 3.375 g Zosyn; The antibiotic was administered within an appropriate time frame prior to the initiation of the procedure. ANESTHESIA/SEDATION: Moderate (conscious) sedation was employed during this procedure. A total of Versed 3.0 mg and Fentanyl 200 mcg was administered intravenously. Moderate Sedation Time: 35 minutes. The patient's level of consciousness and vital signs were monitored continuously by radiology nursing throughout the procedure under  my direct supervision. FLUOROSCOPY TIME:  Fluoroscopy Time: 4 minutes 18 seconds (94 mGy). COMPLICATIONS: None immediate. PROCEDURE: Informed written consent was obtained from the patient after a thorough discussion of the procedural risks, benefits and alternatives. All questions were addressed. Maximal Sterile Barrier Technique was utilized including caps, mask, sterile gowns, sterile gloves, sterile drape, hand hygiene and skin antiseptic. A timeout was performed prior to the initiation of the procedure. Previous imaging reviewed. In the right mid axillary line through a lower intercostal space, ultrasound percutaneous  needle access eventually performed of a peripheral right hepatic duct. There was return of blood tinged bile. Contrast injection performed for limited cholangiogram. Biliary obstruction noted with mild dilatation. 018 guidewire advanced centrally into the common bile duct followed by the Accustick dilator set. Amplatz guidewire exchange performed. Kumpe catheter advanced through the distal CBD obstruction with a Glidewire. Catheter advanced into the duodenum. Contrast injection confirms position. Amplatz guidewire inserted. Tract dilatation performed to insert a 10 [place] internal external biliary drain with the distal loop in the duodenum. Biliary system decompressed by syringe aspiration yielding 100 cc bile. Sample sent for culture. Images obtained for documentation. Catheter secured with Prolene suture and external gravity drainage bag. Sterile dressing applied. No immediate complication. Patient tolerated the procedure well. IMPRESSION: Successful right PTC with 10 [place] internal external biliary drain insertion for distal CBD obstruction Electronically Signed   By: Jerilynn Mages.  Shick M.D.   On: 07/26/2019 11:08   Scheduled Meds:  allopurinol  300 mg Oral Daily   fentaNYL       fentaNYL       lidocaine       midazolam       midazolam       pantoprazole (PROTONIX) IV  40 mg Intravenous Q12H   sodium chloride flush  3 mL Intravenous Q12H   sodium chloride flush  5 mL Intracatheter Q8H   Continuous Infusions:  sodium chloride 125 mL/hr at 07/24/19 1457     LOS: 3 days    Arlan Organ, MD [facility]

## 2019-07-26 NOTE — Procedures (Signed)
Biliary obstruction, ampullary mass  S/p Rt PTC and int/ext drain thru to the duodenum  No comp Stable ebl min 100 cc bile aspirated Full report in pacs

## 2019-07-26 NOTE — Progress Notes (Signed)
[facility] Progress Note  Chief Complaint: Obstructive jaundice  History:  Mr. Fellers underwent PTC today by Dr. Annamaria Boots, who called me afterwards and we discussed the case.  PTC with placement of internal/external drain was successful.  Masaichi is having pain at the site and looking forward to hopefully getting home soon and eventually getting an internal stent. Appetite fair ROS: Cardiovascular: Denies chest pain Respiratory: Denies dyspnea Urinary: Denies dysuria  Objective:   Current Facility-Administered Medications:  .  0.9 %  sodium chloride infusion, , Intravenous, Continuous, Ronnette Juniper, MD, Last Rate: 125 mL/hr at 07/24/19 1457 .  albuterol (PROVENTIL) (2.5 MG/3ML) 0.083% nebulizer solution 2.5 mg, 2.5 mg, Nebulization, Q6H PRN, Ronnette Juniper, MD .  allopurinol (ZYLOPRIM) tablet 300 mg, 300 mg, Oral, Daily, Ronnette Juniper, MD, 300 mg at 07/26/19 1119 .  alum & mag hydroxide-simeth (MAALOX/MYLANTA) 200-200-20 MG/5ML suspension 30 mL, 30 mL, Oral, Q4H PRN, Ronnette Juniper, MD, 30 mL at 07/23/19 2303 .  fentaNYL (SUBLIMAZE) 100 MCG/2ML injection, , , ,  .  fentaNYL (SUBLIMAZE) 100 MCG/2ML injection, , , ,  .  lidocaine (XYLOCAINE) 1 % (with pres) injection, , , ,  .  midazolam (VERSED) 2 MG/2ML injection, , , ,  .  midazolam (VERSED) 2 MG/2ML injection, , , ,  .  ondansetron (ZOFRAN) tablet 4 mg, 4 mg, Oral, Q6H PRN **OR** ondansetron (ZOFRAN) injection 4 mg, 4 mg, Intravenous, Q6H PRN, Ronnette Juniper, MD .  oxyCODONE (Oxy IR/ROXICODONE) immediate release tablet 10 mg, 10 mg, Oral, Q6H PRN, Darien Ramus N, DO, 10 mg at 07/26/19 1519 .  pantoprazole (PROTONIX) EC tablet 40 mg, 40 mg, Oral, BID, Bell, Thomas N, DO .  sodium chloride flush (NS) 0.9 % injection 3 mL, 3 mL, Intravenous, Q12H, Ronnette Juniper, MD, 3 mL at 07/26/19 1119 .  sodium chloride flush (NS) 0.9 % injection 5 mL, 5 mL, Intracatheter, Q8H, Shick, Michael, MD, 5 mL at 07/26/19 1439 .  zolpidem (AMBIEN) tablet 5 mg, 5 mg, Oral, QHS  PRN, Ronnette Juniper, MD, 5 mg at 07/25/19 2109  . sodium chloride 125 mL/hr at 07/24/19 1457     Vital signs in last 24 hrs: Vitals:   07/26/19 1043 07/26/19 1536  BP: 112/74 105/67  Pulse: 61 61  Resp: 16 16  Temp:  97.7 F (36.5 C)  SpO2: 97% 99%    Intake/Output Summary (Last 24 hours) at 07/26/2019 1738 Last data filed at 07/26/2019 1536 Gross per 24 hour  Intake 600 ml  Output 600 ml  Net 0 ml     Physical Exam   HEENT: sclera icteric, oral mucosa without lesions  Neck: supple, no thyromegaly, JVD or lymphadenopathy  Cardiac: RRR without murmurs, S1S2 heard, no peripheral edema  Pulm: clear to auscultation bilaterally, normal RR and effort noted  Abdomen: soft, right upper quadrant tenderness, with active bowel sounds. No guarding or palpable hepatosplenomegaly.  PTC draining dark bile  Skin; warm and dry, + jaundice  Recent Labs:  CBC Latest Ref Rng & Units 07/24/2019 07/23/2019  WBC 4.0 - 10.5 K/uL 4.9 4.6  Hemoglobin 13.0 - 17.0 g/dL 12.4(L) 13.7  Hematocrit 39.0 - 52.0 % 34.9(L) 39.0  Platelets 150 - 400 K/uL 197 228    Recent Labs  Lab 07/26/19 0250  INR 1.2   CMP Latest Ref Rng & Units 07/26/2019 07/25/2019 07/24/2019  Glucose 70 - 99 mg/dL 172(H) 130(H) 131(H)  BUN 6 - 20 mg/dL 10 5(L) 6  Creatinine 0.61 - 1.24 mg/dL  0.76 0.79 0.68  Sodium 135 - 145 mmol/L 130(L) 134(L) 133(L)  Potassium 3.5 - 5.1 mmol/L 3.8 4.2 4.2  Chloride 98 - 111 mmol/L 100 101 99  CO2 22 - 32 mmol/L _0 Calcium 8.9 - 10.3 mg/dL 8.6(L) 8.5(L) 8.7(L)  Total Protein 6.5 - 8.1 g/dL 6.5 6.3(L) 6.5  Total Bilirubin 0.3 - 1.2 mg/dL 11.3(H) 11.7(H) 11.6(H)  Alkaline Phos 38 - 126 U/L 270(H) 251(H) 242(H)  AST 15 - 41 U/L 97(H) 82(H) 95(H)  ALT 0 - 44 U/L 131(H) 130(H) 139(H)     Radiologic studies: INDICATION: Obstructive jaundice, elevated LFTs, and ampullary/pancreatic head mass, failed ERCP   EXAM: ULTRASOUND AND FLUOROSCOPIC RIGHT PTC WITH INTERNAL EXTERNAL  BILIARY DRAIN   MEDICATIONS: 3.375 g Zosyn; The antibiotic was administered within an appropriate time frame prior to the initiation of the procedure.   ANESTHESIA/SEDATION: Moderate (conscious) sedation was employed during this procedure. A total of Versed 3.0 mg and Fentanyl 200 mcg was administered intravenously.   Moderate Sedation Time: 35 minutes. The patient's level of consciousness and vital signs were monitored continuously by radiology nursing throughout the procedure under my direct supervision.   FLUOROSCOPY TIME:  Fluoroscopy Time: 4 minutes 18 seconds (94 mGy).   COMPLICATIONS: None immediate.   PROCEDURE: Informed written consent was obtained from the patient after a thorough discussion of the procedural risks, benefits and alternatives. All questions were addressed. Maximal Sterile Barrier Technique was utilized including caps, mask, sterile gowns, sterile gloves, sterile drape, hand hygiene and skin antiseptic. A timeout was performed prior to the initiation of the procedure.   Previous imaging reviewed.   In the right mid axillary line through a lower intercostal space, ultrasound percutaneous needle access eventually performed of a peripheral right hepatic duct. There was return of blood tinged bile. Contrast injection performed for limited cholangiogram. Biliary obstruction noted with mild dilatation. 018 guidewire advanced centrally into the common bile duct followed by the Accustick dilator set. Amplatz guidewire exchange performed. Kumpe catheter advanced through the distal CBD obstruction with a Glidewire. Catheter advanced into the duodenum. Contrast injection confirms position. Amplatz guidewire inserted. Tract dilatation performed to insert a 10 [place] internal external biliary drain with the distal loop in the duodenum. Biliary system decompressed by syringe aspiration yielding 100 cc bile. Sample sent for culture. Images obtained for  documentation. Catheter secured with Prolene suture and external gravity drainage bag. Sterile dressing applied. No immediate complication. Patient tolerated the procedure well.   IMPRESSION: Successful right PTC with 10 [place] internal external biliary drain insertion for distal CBD obstruction     Electronically Signed   By: Jerilynn Mages.  Shick M.D.   On: 07/26/2019 11:08     _1 @ Assessment:  Obstructive jaundice from pancreatic mass, which precluded ERCP cannulation of bile duct, but biopsies were taken. Successful PTC with placement of internal/external drain to relieve jaundice.  Suspected pancreatic cancer based on radiologic appearance and CA 19-9 of 10,864   Plan: I suspect he will most likely be able to go home tomorrow, and will need plans coordinated for follow-up LFTs in about a week and visit with interventional radiology to determine timing of converting this drain to a metal biliary stent. He will need home health for assistance with drain management. Our team will round on him tomorrow.  Biopsy results will come to Dr. Therisa Doyne of [facility], and she will communicate them to me.  Patient has already been seen in initial consultation by Dr. Burney Gauze,  who will follow the patient afterwards for oncology.   Nelida Meuse III Office: [phone]

## 2019-07-26 NOTE — Plan of Care (Signed)
Patient with abdominal pain following procedure. Increased hydrocodone to 10 mg Q6H however he continued to have abdominal pain so will provide IV morphine 2-4 mg every 4 hours IV as well for breakthrough pain.

## 2019-07-27 DIAGNOSIS — R7989 Other specified abnormal findings of blood chemistry: Secondary | ICD-10-CM

## 2019-07-27 LAB — CBC
HCT: 35.7 % — ABNORMAL LOW (ref 39.0–52.0)
Hemoglobin: 12.6 g/dL — ABNORMAL LOW (ref 13.0–17.0)
MCH: 34.4 pg — ABNORMAL HIGH (ref 26.0–34.0)
MCHC: 35.3 g/dL (ref 30.0–36.0)
MCV: 97.5 fL (ref 80.0–100.0)
Platelets: 241 10*3/uL (ref 150–400)
RBC: 3.66 MIL/uL — ABNORMAL LOW (ref 4.22–5.81)
RDW: 13.2 % (ref 11.5–15.5)
WBC: 8 10*3/uL (ref 4.0–10.5)
nRBC: 0 % (ref 0.0–0.2)

## 2019-07-27 LAB — COMPREHENSIVE METABOLIC PANEL
ALT: 128 U/L — ABNORMAL HIGH (ref 0–44)
AST: 78 U/L — ABNORMAL HIGH (ref 15–41)
Albumin: 3.1 g/dL — ABNORMAL LOW (ref 3.5–5.0)
Alkaline Phosphatase: 246 U/L — ABNORMAL HIGH (ref 38–126)
Anion gap: 7 (ref 5–15)
BUN: 13 mg/dL (ref 6–20)
CO2: 26 mmol/L (ref 22–32)
Calcium: 8.5 mg/dL — ABNORMAL LOW (ref 8.9–10.3)
Chloride: 99 mmol/L (ref 98–111)
Creatinine, Ser: 0.79 mg/dL (ref 0.61–1.24)
GFR calc Af Amer: >60 mL/min (ref >60)
GFR calc non Af Amer: >60 mL/min (ref >60)
Glucose, Bld: 145 mg/dL — ABNORMAL HIGH (ref 70–99)
Potassium: 4 mmol/L (ref 3.5–5.1)
Sodium: 132 mmol/L — ABNORMAL LOW (ref 135–145)
Total Bilirubin: 6.9 mg/dL — ABNORMAL HIGH (ref 0.3–1.2)
Total Protein: 6.5 g/dL (ref 6.5–8.1)

## 2019-07-27 LAB — LIPASE, BLOOD: Lipase: 2085 U/L — ABNORMAL HIGH (ref 11–51)

## 2019-07-27 MED ORDER — ENOXAPARIN SODIUM 40 MG/0.4ML ~~LOC~~ SOLN: 40.0000 mg | SUBCUTANEOUS | Status: DC

## 2019-07-27 MED ORDER — POLYETHYLENE GLYCOL 3350 17 G PO PACK
17.0000 g | PACK | Freq: Every day | ORAL | Status: DC
  Administered 2019-07-27: 17 g via ORAL
  Filled 2019-07-27 (×3): qty 1

## 2019-07-27 MED ORDER — ENOXAPARIN SODIUM 40 MG/0.4ML ~~LOC~~ SOLN
40.0000 mg | Freq: Every day | SUBCUTANEOUS | Status: AC
  Administered 2019-07-27: 40 mg via SUBCUTANEOUS
  Filled 2019-07-27: qty 0.4

## 2019-07-27 NOTE — Progress Notes (Addendum)
Daily Rounding Note  07/27/2019, 12:00 PM  LOS: 4 days   SUBJECTIVE:   Chief complaint:   Obstructive jaundice.  Pancreatic cancer, duodenal mass. Patient already notices that his skin is less yellow. Pain at percutaneous drain site improved.  Last took pain meds before sleeping last night. Percutaneous biliary drain put out 1.2 L yesterday and at least 468m so far today.  OBJECTIVE:         Vital signs in last 24 hours:    Temp:  [97.7 F (36.5 C)-98.3 F (36.8 C)] 98.3 F (36.8 C) (11/23 0540) Pulse Rate:  [54-64] 64 (11/23 0540) Resp:  [16-19] 19 (11/23 0540) BP: (103-118)/(64-74) 118/74 (11/23 0540) SpO2:  [95 %-99 %] 95 % (11/23 0540) Last BM Date: 07/23/19 Filed Weights   07/25/19 0745  Weight: 107 kg   General: Pleasant.  Does look less jaundiced.  Alert.  Comfortable.  Does not look ill. Heart: RRR. Chest: Clear bilaterally.  No labored breathing. Abdomen: Soft.  Dark bilious drainage in percutaneous drain.  Drain site on right benign.  No distention.  Some tenderness around the drain site Extremities: No CCE. Neuro/Psych: Calm, alert.  Oriented x3.  No deficits or weakness.  Intake/Output from previous day: 11/22 0701 - 11/23 0700 In: 600 [P.O.:600] Out: 1205 [Drains:1205]  Intake/Output this shift: Total I/O In: 423 [P.O.:420; I.V.:3] Out: 400 [Drains:400]  Lab Results: Recent Labs    07/27/19 0527  WBC 8.0  HGB 12.6*  HCT 35.7*  PLT 241   BMET Recent Labs    07/25/19 0231 07/26/19 0250 07/27/19 0527  NA 134* 130* 132*  K 4.2 3.8 4.0  CL 101 100 99  CO2 _0 GLUCOSE 130* 172* 145*  BUN 5* 10 13  CREATININE 0.79 0.76 0.79  CALCIUM 8.5* 8.6* 8.5*   LFT Recent Labs    07/25/19 0231 07/26/19 0250 07/27/19 0527  PROT 6.3* 6.5 6.5  ALBUMIN 3.2* 3.1* 3.1*  AST 82* 97* 78*  ALT 130* 131* 128*  ALKPHOS 251* 270* 246*  BILITOT 11.7* 11.3* 6.9*   PT/INR Recent Labs   07/25/19 0231 07/26/19 0250  LABPROT 15.5* 15.4*  INR 1.2 1.2   Hepatitis Panel No results for input(s): HEPBSAG, HCVAB, HEPAIGM, HEPBIGM in the last 72 hours.  Studies/Results: Ir Int ELianne CureBiliary Drain With Cholangiogram  Result Date: 07/26/2019 INDICATION: Obstructive jaundice, elevated LFTs, and ampullary/pancreatic head mass, failed ERCP EXAM: ULTRASOUND AND FLUOROSCOPIC RIGHT PTC WITH INTERNAL EXTERNAL BILIARY DRAIN MEDICATIONS: 3.375 g Zosyn; The antibiotic was administered within an appropriate time frame prior to the initiation of the procedure. ANESTHESIA/SEDATION: Moderate (conscious) sedation was employed during this procedure. A total of Versed 3.0 mg and Fentanyl 200 mcg was administered intravenously. Moderate Sedation Time: 35 minutes. The patient's level of consciousness and vital signs were monitored continuously by radiology nursing throughout the procedure under my direct supervision. FLUOROSCOPY TIME:  Fluoroscopy Time: 4 minutes 18 seconds (94 mGy). COMPLICATIONS: None immediate. PROCEDURE: Informed written consent was obtained from the patient after a thorough discussion of the procedural risks, benefits and alternatives. All questions were addressed. Maximal Sterile Barrier Technique was utilized including caps, mask, sterile gowns, sterile gloves, sterile drape, hand hygiene and skin antiseptic. A timeout was performed prior to the initiation of the procedure. Previous imaging reviewed. In the right mid axillary line through a lower intercostal space, ultrasound percutaneous needle access eventually performed of a peripheral right hepatic duct. There was  return of blood tinged bile. Contrast injection performed for limited cholangiogram. Biliary obstruction noted with mild dilatation. 018 guidewire advanced centrally into the common bile duct followed by the Accustick dilator set. Amplatz guidewire exchange performed. Kumpe catheter advanced through the distal CBD obstruction  with a Glidewire. Catheter advanced into the duodenum. Contrast injection confirms position. Amplatz guidewire inserted. Tract dilatation performed to insert a 10 [place] internal external biliary drain with the distal loop in the duodenum. Biliary system decompressed by syringe aspiration yielding 100 cc bile. Sample sent for culture. Images obtained for documentation. Catheter secured with Prolene suture and external gravity drainage bag. Sterile dressing applied. No immediate complication. Patient tolerated the procedure well. IMPRESSION: Successful right PTC with 10 [place] internal external biliary drain insertion for distal CBD obstruction Electronically Signed   By: Jerilynn Mages.  Shick M.D.   On: 07/26/2019 11:08    Scheduled Meds:  allopurinol  300 mg Oral Daily   pantoprazole  40 mg Oral BID   sodium chloride flush  3 mL Intravenous Q12H   sodium chloride flush  5 mL Intracatheter Q8H   Continuous Infusions:  sodium chloride 125 mL/hr at 07/24/19 1457   PRN Meds:.albuterol, alum & mag hydroxide-simeth, morphine injection, ondansetron **OR** ondansetron (ZOFRAN) IV, oxyCODONE, zolpidem   ASSESMENT:   *   Obstructive jaundice.  New dx of presumed primary pancreatic cancer with liver and nodal mets on CT scan of 11/19.  CA 19-9 greater than 10,800. 07/25/2019 EGD/failed ERCP.  Malignant appearing duodenal mass.  Biopsies obtained. Dr Therisa Doyne unable to successfully cannulate CBD, wire seem to advance through the ulcerated mass conferring high risk for perforation so study aborted. 10/22 right PTC  internal/external biliary drain placement by Dr Annamaria Boots.  Plan is for eventual internalization of stent. Oncologist, Dr. Marin Olp,  wants Port-A-Cath placed and feels he needs thromboembolic disease prophylaxis with sounds sort of anticoagulation. Jaundice, LFTs improving.  *    Lipase 2085.  Possible post ERCP pancreatitis but symptoms mild at worst.  PLAN   *    Awaiting pathology.  *    IR will be  managing the percutaneous drain and internalizing when appropriate.  *   Port-A-Cath placement planned for tomorrow and hopefully can go home after that.  *     GI, Dr. Loletha Carrow, available for outpatient follow-up if needed.  *   If/when thromboembolic prophylaxis anticoagulation added, there is risk of bleeding from the duodenal mass.  *    Continue Protonix.  Does he need twice daily or will daily be sufficient    Azucena Freed  07/27/2019, 12:00 PM Phone [phone]

## 2019-07-27 NOTE — Progress Notes (Signed)
Chief Complaint: Patient was seen in consultation today for pancreatic mass/Port-a-cath placement and biliary obstruction s/p biliary drain placement.  Referring Physician(s): Nelida Meuse III; Volanda Napoleon  Supervising Physician: Aletta Edouard  Patient Status: [facility] - In-pt  History of Present Illness: Jeffrey Harris is a 63 y.o. male with a past medical history of gout. He presented to [facility] ED 07/23/2019 after being sent by his PCP for jaundice and elevated LFTs. He had upper abdominal pain x week(s) that prompted him to see his PCP for further evaluation. At PCP office, patient noted to be jaundiced with elevations of AST, ALT, and total bilirubin (112, 215, and 12.3 respectively). In ED, RUQ ultrasound revealed dilation of the CBS with 2 hypoechoic masses. Patient was admitted for further management. GI was consulted and patient underwent ERCP with attempted stent placement 07/25/2019 by Dr. Therisa Doyne, however was unsuccessful secondary to inability to pass ampullary mass (per op note- "Initial attempt at cannulation of CBD was aborted as the wire seemed to advance through the ulcerated mass increasing risk of perforation"). During this, biopsies of mass were also taken, surgical pathology pending. IR was consulted and patient underwent an image-guided internal/external biliary drain placement in IR 07/26/2019 by Dr. Annamaria Boots. He was seen by oncology who recommends Port-a-cath placement, as it is highly suspicious that this mass is pancreatic cancer, and he has tentative plans to begin systemic chemotherapy for management.  IR consulted by Dr. Marin Olp for possible image-guided Port-a-cath placement. Patient awake and alert sitting in bed watching TV. Complains of tenderness at internal/external biliary drain site, as expected. Internal/external biliary drain site c/d/i. Denies fever, chills, chest pain, dyspnea, or headache.  Currently receiving Lovenox 40 mg SQ injections Q24H.   Past  Medical History:  Diagnosis Date  . Gout     Past Surgical History:  Procedure Laterality Date  . BIOPSY  07/25/2019   Procedure: BIOPSY;  Surgeon: Ronnette Juniper, MD;  Location: [facility] ENDOSCOPY;  Service: Gastroenterology;;  . ESOPHAGOGASTRODUODENOSCOPY  07/25/2019   Procedure: ESOPHAGOGASTRODUODENOSCOPY (EGD);  Surgeon: Ronnette Juniper, MD;  Location: [facility] ENDOSCOPY;  Service: Gastroenterology;;  . IR INT EXT BILIARY DRAIN WITH CHOLANGIOGRAM  07/26/2019  . KNEE ARTHROSCOPY      Allergies: Patient has no known allergies.  Medications: Prior to Admission medications   Medication Sig Start Date End Date Taking? Authorizing Provider  allopurinol (ZYLOPRIM) 300 MG tablet Take 300 mg by mouth daily.   Yes [provider]     Family History  Problem Relation Age of Onset  . Diabetes Father     Social History   Socioeconomic History  . Marital status: Married    Spouse name: Not on file  . Number of children: Not on file  . Years of education: Not on file  . Highest education level: Not on file  Occupational History  . Not on file  Social Needs  . Financial resource strain: Not on file  . Food insecurity    Worry: Not on file    Inability: Not on file  . Transportation needs    Medical: Not on file    Non-medical: Not on file  Tobacco Use  . Smoking status: Never Smoker  . Smokeless tobacco: Former Systems developer    Types: Snuff  Substance and Sexual Activity  . Alcohol use: No  . Drug use: No  . Sexual activity: Never  Lifestyle  . Physical activity    Days per week: Not on file  Minutes per session: Not on file  . Stress: Not on file  Relationships  . Social Herbalist on phone: Not on file    Gets together: Not on file    Attends religious service: Not on file    Active member of club or organization: Not on file    Attends meetings of clubs or organizations: Not on file    Relationship status: Not on file  Other Topics Concern  . Not on file  Social  History Narrative  . Not on file     Review of Systems: A 12 point ROS discussed and pertinent positives are indicated in the HPI above.  All other systems are negative.  Review of Systems  Constitutional: Negative for chills and fever.  Respiratory: Negative for shortness of breath and wheezing.   Cardiovascular: Negative for chest pain and palpitations.  Gastrointestinal: Positive for abdominal pain.  Neurological: Negative for headaches.  Psychiatric/Behavioral: Negative for behavioral problems and confusion.    Vital Signs: BP 118/74 (BP Location: Left Arm)   Pulse 64   Temp 98.3 F (36.8 C) (Oral)   Resp 19   Ht _0  (1.803 m)   Wt 236 lb (107 kg)   SpO2 95%   BMI 32.92 kg/m   Physical Exam Vitals signs and nursing note reviewed.  Constitutional:      General: He is not in acute distress.    Appearance: Normal appearance.  Cardiovascular:     Rate and Rhythm: Normal rate and regular rhythm.     Heart sounds: Normal heart sounds. No murmur.  Pulmonary:     Effort: Pulmonary effort is normal. No respiratory distress.     Breath sounds: Normal breath sounds. No wheezing.  Abdominal:     Comments: Internal/external biliary drain site with mild tenderness, no erythema, drainage, or active bleeding; approximately 100 cc dark brown bile in gravity bag.  Skin:    General: Skin is warm and dry.  Neurological:     Mental Status: He is alert and oriented to person, place, and time.  Psychiatric:        Mood and Affect: Mood normal.        Behavior: Behavior normal.        Thought Content: Thought content normal.        Judgment: Judgment normal.      MD Evaluation Airway: WNL Heart: WNL Abdomen: WNL Chest/ Lungs: WNL ASA  Classification: 3 Mallampati/Airway Score: One   Imaging: Ct Chest W Contrast  Result Date: 07/24/2019 CLINICAL DATA:  Findings on previous day's abdomen and pelvis CT consistent with pancreatic carcinoma and metastatic disease. EXAM:  CT CHEST WITH CONTRAST TECHNIQUE: Multidetector CT imaging of the chest was performed during intravenous contrast administration. CONTRAST:  35m OMNIPAQUE IOHEXOL 300 MG/ML  SOLN COMPARISON:  Abdomen and pelvis CT, 07/23/2019. FINDINGS: Cardiovascular: Heart is normal in size and configuration. No pericardial effusion 2 vessel coronary artery calcifications. Great vessels are normal in caliber. Tiny focus of atherosclerotic calcification along the descending thoracic aorta. Mediastinum/Nodes: Prominent thyroid gland with small calcifications, no defined nodule. No mediastinal or hilar masses. No enlarged lymph nodes. Trachea and esophagus are unremarkable. Lungs/Pleura: Lungs are clear. No pleural effusion or pneumothorax. Upper Abdomen: No acute finding or change from the previous day's abdominopelvic CT Musculoskeletal: No fracture or acute finding.  No bone lesion. IMPRESSION: 1. No acute findings in the chest. 2. No evidence of metastatic disease or a primary neoplasm in the  chest. 3. Prominent thyroid gland with calcifications. Consider non urgent follow-up thyroid ultrasound further assessment. 4. Two vessel coronary artery calcifications and minimal aortic atherosclerosis. Aortic Atherosclerosis (ICD10-I70.0). Electronically Signed   By: Lajean Manes M.D.   On: 07/24/2019 16:54   Ct Abdomen Pelvis W Contrast  Result Date: 07/24/2019 CLINICAL DATA:  Painless jaundice for 1 week. Unintentional weight loss over past 3 months. EXAM: CT ABDOMEN AND PELVIS WITH CONTRAST TECHNIQUE: Multidetector CT imaging of the abdomen and pelvis was performed using the standard protocol following bolus administration of intravenous contrast. CONTRAST:  11m OMNIPAQUE IOHEXOL 300 MG/ML  SOLN COMPARISON:  None. FINDINGS: Lower Chest: No acute findings. Hepatobiliary: Multiple small hypovascular lesions are seen throughout the right and left lobes. Largest lesion is seen in segment 4A, measuring 2.1 x 1.5 cm on image 12/3.  These are consistent with diffuse pulmonary metastases. The gallbladder is distended, however there are no signs of acute cholecystitis. Diffuse biliary ductal dilatation is seen to the level of the pancreatic head. Pancreas: Subtle ill-defined soft tissue prominence is seen in the pancreatic head which measures 2.8 x 2.6 cm, and is suspicious for pancreatic carcinoma. No evidence of pancreatic ductal dilatation or peripancreatic inflammatory changes. Spleen: Within normal limits in size and appearance. Adrenals/Urinary Tract: A 2.4 cm fat attenuation mass is seen in the left adrenal gland, consistent with a benign myelolipoma. The right adrenal gland and left kidney are normal appearance. Right-sided pelvic kidney is noted which is otherwise unremarkable in appearance. No evidence of renal mass or hydronephrosis. Unremarkable unopacified urinary bladder. Stomach/Bowel: No evidence of obstruction, inflammatory process or abnormal fluid collections. Normal appendix visualized. Vascular/Lymphatic: A 1.3 cm peripancreatic lymph node is seen just posterior to the portal venous confluence on image 19/3. A 1.4 cm porta hepatis lymph node is seen on image 17/3. 8 mm lymph node is also seen adjacent to the GE junction on image 12/13. No other pathologically enlarged lymph nodes are identified. There is no evidence of vascular involvement of SMA, SMV, or portal vein. Reproductive:  No mass or other significant abnormality. Other:  None. Musculoskeletal:  No suspicious bone lesions identified. IMPRESSION: 1. Diffuse biliary ductal dilatation, with approximately 2.8 cm soft tissue prominence in the pancreatic head, highly suspicious for primary pancreatic carcinoma. Recommend abdomen MRI and MRCP without and with contrast for further evaluation. 2. Diffuse liver metastases. 3. Mild peripancreatic and porta hepatis lymphadenopathy, suspicious for metastatic disease. 4. 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 Electronically Signed   By: JMarlaine HindM.D.   On: 07/24/2019 07:50   Dg Ercp Biliary & Pancreatic Ducts  Result Date: 07/25/2019 CLINICAL DATA:  Obstructive jaundice, concern for pancreas mass EXAM: ERCP unsuccessful TECHNIQUE: Multiple spot images obtained with the fluoroscopic device and submitted for interpretation post-procedure. FLUOROSCOPY TIME:  Fluoroscopy Time:  52 seconds Number of Acquired Spot Images: 1 COMPARISON:  07/23/2019 FINDINGS: Single spot fluoroscopic view demonstrates the endoscope. Biliary tree not opacified. IMPRESSION: Nonvisualization of the biliary tree.  Unsuccessful ERCP. These images were submitted for radiologic interpretation only. Please see the procedural report for the amount of contrast and the fluoroscopy time utilized. Electronically Signed   By: MJerilynn Mages  Shick M.D.   On: 07/25/2019 09:32   Ir Int ELianne CureBiliary Drain With Cholangiogram  Result Date: 07/26/2019 INDICATION: Obstructive jaundice, elevated LFTs, and ampullary/pancreatic head mass, failed ERCP EXAM: ULTRASOUND AND FLUOROSCOPIC RIGHT PTC WITH INTERNAL EXTERNAL BILIARY DRAIN MEDICATIONS: 3.375 g Zosyn; The antibiotic was administered within  an appropriate time frame prior to the initiation of the procedure. ANESTHESIA/SEDATION: Moderate (conscious) sedation was employed during this procedure. A total of Versed 3.0 mg and Fentanyl 200 mcg was administered intravenously. Moderate Sedation Time: 35 minutes. The patient's level of consciousness and vital signs were monitored continuously by radiology nursing throughout the procedure under my direct supervision. FLUOROSCOPY TIME:  Fluoroscopy Time: 4 minutes 18 seconds (94 mGy). COMPLICATIONS: None immediate. PROCEDURE: Informed written consent was obtained from the patient after a thorough discussion of the procedural risks, benefits and alternatives. All questions were addressed. Maximal Sterile Barrier Technique was utilized including caps, mask, sterile  gowns, sterile gloves, sterile drape, hand hygiene and skin antiseptic. A timeout was performed prior to the initiation of the procedure. Previous imaging reviewed. In the right mid axillary line through a lower intercostal space, ultrasound percutaneous needle access eventually performed of a peripheral right hepatic duct. There was return of blood tinged bile. Contrast injection performed for limited cholangiogram. Biliary obstruction noted with mild dilatation. 018 guidewire advanced centrally into the common bile duct followed by the Accustick dilator set. Amplatz guidewire exchange performed. Kumpe catheter advanced through the distal CBD obstruction with a Glidewire. Catheter advanced into the duodenum. Contrast injection confirms position. Amplatz guidewire inserted. Tract dilatation performed to insert a 10 [place] internal external biliary drain with the distal loop in the duodenum. Biliary system decompressed by syringe aspiration yielding 100 cc bile. Sample sent for culture. Images obtained for documentation. Catheter secured with Prolene suture and external gravity drainage bag. Sterile dressing applied. No immediate complication. Patient tolerated the procedure well. IMPRESSION: Successful right PTC with 10 [place] internal external biliary drain insertion for distal CBD obstruction Electronically Signed   By: Jerilynn Mages.  Shick M.D.   On: 07/26/2019 11:08   US Abdomen Limited Ruq  Result Date: 07/23/2019 CLINICAL DATA:  Abdominal discomfort and jaundice EXAM: ULTRASOUND ABDOMEN LIMITED RIGHT UPPER QUADRANT COMPARISON:  None. FINDINGS: Gallbladder: No gallstones or wall thickening visualized. No sonographic Murphy sign noted by sonographer. Common bile duct: Diameter: 9 mm Liver: Heterogeneous echotexture. Ill-defined 2.1 x 2.1 x 1.5 cm hypoechoic area in the right lobe and 1.7 x 1.6 x 1.5 cm hypoechoic area in the left lobe. Portal vein is patent on color Doppler imaging with normal direction of blood flow  towards the liver. Other: None. IMPRESSION: Dilatation of the common bile duct. Heterogeneous appearance of the liver with two nonspecific ill-defined hypoechoic areas. No evidence of acute cholecystitis. Electronically Signed   By: Macy Mis M.D.   On: 07/23/2019 11:23    Labs:  CBC: Recent Labs    07/23/19 1142 07/24/19 0154 07/27/19 0527  WBC 4.6 4.9 8.0  HGB 13.7 12.4* 12.6*  HCT 39.0 34.9* 35.7*  PLT 228 197 241    COAGS: Recent Labs    07/23/19 1100 07/25/19 0231 07/26/19 0250  INR 1.1 1.2 1.2    BMP: Recent Labs    07/24/19 0154 07/25/19 0231 07/26/19 0250 07/27/19 0527  NA 133* 134* 130* 132*  K 4.2 4.2 3.8 4.0  CL 99 101 100 99  CO2 _0 GLUCOSE 131* 130* 172* 145*  BUN 6 5* 10 13  CALCIUM 8.7* 8.5* 8.6* 8.5*  CREATININE 0.68 0.79 0.76 0.79  GFRNONAA >60 >60 >60 >60  GFRAA >60 >60 >60 >60    LIVER FUNCTION TESTS: Recent Labs    07/24/19 0154 07/25/19 0231 07/26/19 0250 07/27/19 0527  BILITOT 11.6* 11.7* 11.3* 6.9*  AST 95*  82* 97* 78*  ALT 139* 130* 131* 128*  ALKPHOS 242* 251* 270* 246*  PROT 6.5 6.3* 6.5 6.5  ALBUMIN 3.3* 3.2* 3.1* 3.1*     Assessment and Plan:  Pancreatic mass concerning for pancreatic cancer, surgical pathology pending, with tentative plans for systemic chemotherapy. Plan for image-guided Port-a-cath placement tentatively for tomorrow 07/28/2019 in IR. Patient will be NPO at midnight. Afebrile and WBCs WNL. Will hold Lovenox per IR protocol. INR 1.2 07/26/2019.  Risks and benefits of image guided port-a-catheter placement were discussed with the patient including, but not limited to bleeding, infection, pneumothorax, or fibrin sheath development and need for additional procedures. All of the patient's questions were answered, patient is agreeable to proceed. Consent signed and in chart.   Biliary obstruction secondary to ampullary/pancratic head mass s/p internal/external biliary drain placement in  IR 07/26/2019 by Dr. Annamaria Boots. Biliary drain stable with approximately 100 cc dark brown bile in gravity bag. Continue current drain management- continue with Qshift flushes/monitor of output. Further plans per TRH/GI/oncology- appreciate and agree with management. IR to follow.   Thank you for this interesting consult.  I greatly enjoyed meeting Jeffrey Harris and look forward to participating in their care.  A copy of this report was sent to the requesting provider on this date.  Electronically Signed: Earley Abide, PA-C 07/27/2019, 11:12 AM   I spent a total of 40 Minutes in face to face in clinical consultation, greater than 50% of which was counseling/coordinating care for pancreatic mass/Port-a-cath placement and biliary obstruction s/p biliary drain placement.

## 2019-07-27 NOTE — Progress Notes (Signed)
PROGRESS NOTE    Jeffrey Harris  ZJQ:[id number] DOB: 08/05/1956 DOA: 07/23/2019 PCP: [name], MD   Brief Narrative:  Jeffrey Harris a 63 y.o.malewith medical history significant ofgout.Prediabetic.He presents with complaints of right upper quadrant fullness over the last 3 weeks.Associated with loose stool/hemorrhoid.Intentional weight loss of 30 pounds. He has used Tylenol PM to help him sleep for the last 2 months but he is not taking more than the recommended amount.  Over the last day he noticed that his eyes were turning yellow and asked when he went to his primary care provider to do lab work. Lab work was noted to be abnormal and he was sent here to the emergency department for further evaluation. CT abdomen shows lesion in pancreatic head, underwent ERCP with biopsies but failed stent placement which was later done by IR as percutaneous hepatic drain which resulted in improvement in his bilirubin and pain. CA 19-9 elevated, highly suspicious for adenocarcinoma of pancreas.  Waiting for biopsy results.  Subjective: Patient was feeling better when seen this morning.  His pain is improving after percutaneous drainage was placed.  He was waiting for biopsy results.  Assessment & Plan:   Active Problems:   Hyperbilirubinemia   Elevated liver enzymes   History of gout   Obesity (BMI 30-39.9)   Pancreatic mass   Elevated LFTs   Jaundice   Obstructive jaundice   Hyponatremia   Pancreatic mass.  Most likely adenocarcinoma of pancreas with possible liver and nodal mets.  CA 19-9 was over 10,000. Oncology saw him today and ordered a Port-A-Cath which will be placed tomorrow. -Waiting for final biopsy results. -Continue pain management.  Hyperbilirubinemia.  With elevated liver enzymes and alkaline phosphatase, most likely secondary to pancreatic mass.  History of gout: -Continue allopurinol  Mild hyponatremia.  Improving without any intervention, sodium was  132 today.  Objective: Vitals:   07/26/19 1536 07/26/19 2105 07/27/19 0540 07/27/19 1440  BP: 105/67 103/64 118/74 118/80  Pulse: 61 (!) 54 64 81  Resp: 16 17 19 18   Temp: 97.7 F (36.5 C) 97.8 F (36.6 C) 98.3 F (36.8 C) 98.5 F (36.9 C)  TempSrc: Oral Oral Oral Oral  SpO2: 99% 98% 95% 98%  Weight:      Height:        Intake/Output Summary (Last 24 hours) at 07/27/2019 1516 Last data filed at 07/27/2019 1442 Gross per 24 hour  Intake 423 ml  Output 1405 ml  Net -982 ml   Filed Weights   07/25/19 0745  Weight: 107 kg    Examination:  General exam: Appears calm and comfortable  Respiratory system: Clear to auscultation. Respiratory effort normal. Cardiovascular system: S1 & S2 heard, RRR. No JVD, murmurs, rubs, gallops or clicks. No pedal edema. Gastrointestinal system: RUQ drain in place.  Abdominal soft with mild right upper quadrant tenderness.  Bowel sounds positive. Central nervous system: Alert and oriented. No focal neurological deficits. Extremities: Symmetric 5 x 5 power. Skin: No rashes, lesions or ulcers Psychiatry: Judgement and insight appear normal. Mood & affect appropriate.   DVT prophylaxis: Lovenox Code Status: Full Family Communication: No family at bedside. Disposition Plan: Pending Port-A-Cath placement and planning by oncology  Consultants:   Gastroenterology  Oncology  IR  Procedures:  ERCP. Percutaneous hepatic drain placement by IR.  Antimicrobials: None  Data Reviewed: I have personally reviewed following labs and imaging studies  CBC: Recent Labs  Lab 07/23/19 1142 07/24/19 0154 07/27/19 0527  WBC 4.6  4.9 8.0  NEUTROABS 3.0  --   --   HGB 13.7 12.4* 12.6*  HCT 39.0 34.9* 35.7*  MCV 97.3 96.4 97.5  PLT 228 197 144   Basic Metabolic Panel: Recent Labs  Lab 07/23/19 1142 07/24/19 0154 07/25/19 0231 07/26/19 0250 07/27/19 0527  NA 134* 133* 134* 130* 132*  K 3.8 4.2 4.2 3.8 4.0  CL 99 99 101 100 99  CO2 25  24 22 22 26   GLUCOSE 125* 131* 130* 172* 145*  BUN 7 6 5* 10 13  CREATININE 0.72 0.68 0.79 0.76 0.79  CALCIUM 9.3 8.7* 8.5* 8.6* 8.5*   GFR: Estimated Creatinine Clearance: 122.2 mL/min (by C-G formula based on SCr of 0.79 mg/dL). Liver Function Tests: Recent Labs  Lab 07/23/19 1142 07/24/19 0154 07/25/19 0231 07/26/19 0250 07/27/19 0527  AST 114* 95* 82* 97* 78*  ALT 164* 139* 130* 131* 128*  ALKPHOS 300* 242* 251* 270* 246*  BILITOT 13.6* 11.6* 11.7* 11.3* 6.9*  PROT 7.2 6.5 6.3* 6.5 6.5  ALBUMIN 3.9 3.3* 3.2* 3.1* 3.1*   Recent Labs  Lab 07/23/19 1142 07/25/19 0231 07/26/19 0250 07/27/19 0527  LIPASE 297* 781* 581* 2,085*   No results for input(s): AMMONIA in the last 168 hours. Coagulation Profile: Recent Labs  Lab 07/23/19 1100 07/25/19 0231 07/26/19 0250  INR 1.1 1.2 1.2   Cardiac Enzymes: No results for input(s): CKTOTAL, CKMB, CKMBINDEX, TROPONINI in the last 168 hours. BNP (last 3 results) No results for input(s): PROBNP in the last 8760 hours. HbA1C: No results for input(s): HGBA1C in the last 72 hours. CBG: No results for input(s): GLUCAP in the last 168 hours. Lipid Profile: No results for input(s): CHOL, HDL, LDLCALC, TRIG, CHOLHDL, LDLDIRECT in the last 72 hours. Thyroid Function Tests: No results for input(s): TSH, T4TOTAL, FREET4, T3FREE, THYROIDAB in the last 72 hours. Anemia Panel: No results for input(s): VITAMINB12, FOLATE, FERRITIN, TIBC, IRON, RETICCTPCT in the last 72 hours. Sepsis Labs: No results for input(s): PROCALCITON, LATICACIDVEN in the last 168 hours.  Recent Results (from the past 240 hour(s))  SARS CORONAVIRUS 2 (TAT 6-24 HRS) Nasopharyngeal Nasopharyngeal Swab     Status: None   Collection Time: 07/24/19  3:35 PM   Specimen: Nasopharyngeal Swab  Result Value Ref Range Status   SARS Coronavirus 2 NEGATIVE NEGATIVE Final    Comment: (NOTE) SARS-CoV-2 target nucleic acids are NOT DETECTED. The SARS-CoV-2 RNA is generally  detectable in upper and lower respiratory specimens during the acute phase of infection. Negative results do not preclude SARS-CoV-2 infection, do not rule out co-infections with other pathogens, and should not be used as the sole basis for treatment or other patient management decisions. Negative results must be combined with clinical observations, patient history, and epidemiological information. The expected result is Negative. Fact Sheet for Patients: SugarRoll.be Fact Sheet for Healthcare Providers: https://www.woods-mathews.com/ This test is not yet approved or cleared by the [place] FDA and  has been authorized for detection and/or diagnosis of SARS-CoV-2 by FDA under an Emergency Use Authorization (EUA). This EUA will remain  in effect (meaning this test can be used) for the duration of the COVID-19 declaration under Section 56 4(b)(1) of the Act, 21 U.S.C. section 360bbb-3(b)(1), unless the authorization is terminated or revoked sooner. Performed at [facility], [place] [address], [place], [place] [address]   Aerobic/Anaerobic Culture (surgical/deep wound)     Status: None (Preliminary result)   Collection Time: 07/26/19 10:43 AM   Specimen: BILE  Result Value Ref Range Status   Specimen Description BILE  Final   Special Requests Normal  Final   Gram Stain NO WBC SEEN NO ORGANISMS SEEN   Final   Culture   Final    CULTURE REINCUBATED FOR BETTER GROWTH Performed at [facility], [address] [place], [place] [address]    Report Status PENDING  Incomplete     Radiology Studies: Ir Int Lianne Cure Biliary Drain With Cholangiogram  Result Date: 07/26/2019 INDICATION: Obstructive jaundice, elevated LFTs, and ampullary/pancreatic head mass, failed ERCP EXAM: ULTRASOUND AND FLUOROSCOPIC RIGHT PTC WITH INTERNAL EXTERNAL BILIARY DRAIN MEDICATIONS: 3.375 g Zosyn; The antibiotic was administered within an appropriate time  frame prior to the initiation of the procedure. ANESTHESIA/SEDATION: Moderate (conscious) sedation was employed during this procedure. A total of Versed 3.0 mg and Fentanyl 200 mcg was administered intravenously. Moderate Sedation Time: 35 minutes. The patient's level of consciousness and vital signs were monitored continuously by radiology nursing throughout the procedure under my direct supervision. FLUOROSCOPY TIME:  Fluoroscopy Time: 4 minutes 18 seconds (94 mGy). COMPLICATIONS: None immediate. PROCEDURE: Informed written consent was obtained from the patient after a thorough discussion of the procedural risks, benefits and alternatives. All questions were addressed. Maximal Sterile Barrier Technique was utilized including caps, mask, sterile gowns, sterile gloves, sterile drape, hand hygiene and skin antiseptic. A timeout was performed prior to the initiation of the procedure. Previous imaging reviewed. In the right mid axillary line through a lower intercostal space, ultrasound percutaneous needle access eventually performed of a peripheral right hepatic duct. There was return of blood tinged bile. Contrast injection performed for limited cholangiogram. Biliary obstruction noted with mild dilatation. 018 guidewire advanced centrally into the common bile duct followed by the Accustick dilator set. Amplatz guidewire exchange performed. Kumpe catheter advanced through the distal CBD obstruction with a Glidewire. Catheter advanced into the duodenum. Contrast injection confirms position. Amplatz guidewire inserted. Tract dilatation performed to insert a 10 [place] internal external biliary drain with the distal loop in the duodenum. Biliary system decompressed by syringe aspiration yielding 100 cc bile. Sample sent for culture. Images obtained for documentation. Catheter secured with Prolene suture and external gravity drainage bag. Sterile dressing applied. No immediate complication. Patient tolerated the procedure  well. IMPRESSION: Successful right PTC with 10 [place] internal external biliary drain insertion for distal CBD obstruction Electronically Signed   By: Jerilynn Mages.  Shick M.D.   On: 07/26/2019 11:08    Scheduled Meds: . allopurinol  300 mg Oral Daily  . pantoprazole  40 mg Oral BID  . polyethylene glycol  17 g Oral Daily  . sodium chloride flush  3 mL Intravenous Q12H  . sodium chloride flush  5 mL Intracatheter Q8H   Continuous Infusions: . sodium chloride 125 mL/hr at 07/24/19 1457     LOS: 4 days   Time spent: 40 minutes  Lorella Nimrod, MD [facility] Pager [phone]  If 7PM-7AM, please contact night-coverage [email or web address] Password [facility] 07/27/2019, 3:16 PM   This record has been created using Dragon voice recognition software. Errors have been sought and corrected,but may not always be located. Such creation errors do not reflect on the standard of care.

## 2019-07-27 NOTE — Progress Notes (Signed)
Jeffrey Harris certainly had a busy weekend.  He underwent upper endoscopy.  A stent try to be placed that way.  Unfortunate, this could not be done.  He now has a percutaneous transhepatic stent in place.  This does seem to be draining.  He does have some discomfort.  The stent is working quite nicely.  His bilirubin is down to 6.9.  His liver function studies are little bit better.  He clearly has adenocarcinoma of the pancreas.  His CA 19-9 is over 10,800.  I the ERCP report, it looks like there is a mass that they noted at the ampulla.  This was biopsied.  As such, we should have enough specimen for our genetic studies.  He will need to have a Port-A-Cath placed.  I will see about getting this put in, if possible, while he is in the hospital.  His CT of the chest was unremarkable.  All of his vital signs look stable.  His temperature is 98.3.  Pulse 64.  Blood pressure 118/74.  He definitely does not look as jaundiced.  His lungs sound clear bilaterally.  Cardiac exam regular rate and rhythm.  Abdomen is soft.  He has the drainage catheter on his right side.  His abdomen has slightly decreased bowel sounds.  Extremities shows no clubbing, cyanosis or edema.  Neurological exam is nonfocal.  He definitely needs to be on some kind of anticoagulation.  He does have a significant risk for thromboembolic disease secondary to his underlying malignancy.    Jeffrey Haw, MD  1 Thessalonians 5:11

## 2019-07-28 ENCOUNTER — Other Ambulatory Visit: Payer: Self-pay | Admitting: Radiology

## 2019-07-28 ENCOUNTER — Other Ambulatory Visit: Payer: Self-pay

## 2019-07-28 ENCOUNTER — Encounter (HOSPITAL_COMMUNITY): Payer: Self-pay | Admitting: General Practice

## 2019-07-28 DIAGNOSIS — K831 Obstruction of bile duct: Secondary | ICD-10-CM

## 2019-07-28 LAB — LIPASE, BLOOD: Lipase: 1074 U/L — ABNORMAL HIGH (ref 11–51)

## 2019-07-28 LAB — SURGICAL PATHOLOGY

## 2019-07-28 MED ORDER — SENNOSIDES-DOCUSATE SODIUM 8.6-50 MG PO TABS: 2.0000 | ORAL_TABLET | Freq: Every evening | ORAL | Status: DC | PRN

## 2019-07-28 MED ORDER — HEPARIN SODIUM (PORCINE) 5000 UNIT/ML IJ SOLN
5000.0000 [IU] | Freq: Three times a day (TID) | INTRAMUSCULAR | Status: AC
  Administered 2019-07-28 (×2): 5000 [IU] via SUBCUTANEOUS
  Filled 2019-07-28 (×2): qty 1

## 2019-07-28 MED ORDER — POLYETHYLENE GLYCOL 3350 17 G PO PACK: 17.0000 g | PACK | Freq: Every day | ORAL | Status: DC | PRN

## 2019-07-28 MED ORDER — CEFAZOLIN SODIUM-DEXTROSE 2-4 GM/100ML-% IV SOLN
2.0000 g | INTRAVENOUS | Status: AC
  Administered 2019-07-29: 2 g via INTRAVENOUS
  Filled 2019-07-28: qty 100

## 2019-07-28 NOTE — Progress Notes (Signed)
PROGRESS NOTE    Jeffrey Harris  IRW:[id number] DOB: 27-Apr-1956 DOA: 07/23/2019 PCP: [name], MD   Brief Narrative:  63 year old with history of gout presented with right upper quadrant fullness for 3 weeks, unintentional weight loss.  Work-up including CT of the abdomen showed pancreatic head mass with significantly elevated CA 19-9 concerning for pancreatic adenocarcinoma.  ERCP with biopsies were performed with failed stent placement therefore IR placed percutaneous hepatic drain due to elevated bilirubin and pain.   Assessment & Plan:   Active Problems:   Hyperbilirubinemia   Elevated liver enzymes   History of gout   Obesity (BMI 30-39.9)   Pancreatic mass   Elevated LFTs   Jaundice   Obstructive jaundice   Hyponatremia  Pancreatic mass concerning for adenocarcinoma Hyperbilirubinemia, due to obstructive mass -Pending final biopsy results but highly suspicion for adenocarcinoma -Oncology/GI following. -IR -postpone Port-A-Cath placement for tomorrow -Per oncology patient will need some sort of chemo DVT prophylaxis Lab work pending from this morning.  Hyperbilirubinemia -Secondary to obstructive pancreatic mass of the head.  Drain in place  History of gout -On allopurinol  DVT prophylaxis: Subcutaneous heparin Code Status: Full code Family Communication: None Disposition Plan: During hospital stay until he has Port-A-Cath placed.  Consultants:   IR  GI  Oncology   Subjective: Sitting up in the chair, denies any complaints for now.  He is very anxious to know his diagnosis so we can further proceed with the treatment plan.  Review of Systems Otherwise negative except as per HPI, including: General: Denies fever, chills, night sweats or unintended weight loss. Resp: Denies cough, wheezing, shortness of breath. Cardiac: Denies chest pain, palpitations, orthopnea, paroxysmal nocturnal dyspnea. GI: Denies abdominal pain, nausea, vomiting, diarrhea or  constipation GU: Denies dysuria, frequency, hesitancy or incontinence MS: Denies muscle aches, joint pain or swelling Neuro: Denies headache, neurologic deficits (focal weakness, numbness, tingling), abnormal gait Psych: Denies anxiety, depression, SI/HI/AVH Skin: Denies new rashes or lesions ID: Denies sick contacts, exotic exposures, travel  Objective: Vitals:   07/27/19 0540 07/27/19 1440 07/27/19 2057 07/28/19 0517  BP: 118/74 118/80 111/73 131/74  Pulse: 64 81 67 72  Resp: _0 Temp: 98.3 F (36.8 C) 98.5 F (36.9 C) 98.4 F (36.9 C) 97.9 F (36.6 C)  TempSrc: Oral Oral Oral Oral  SpO2: 95% 98% 97% 97%  Weight:      Height:        Intake/Output Summary (Last 24 hours) at 07/28/2019 0819 Last data filed at 07/28/2019 0517 Gross per 24 hour  Intake 823 ml  Output 1450 ml  Net -627 ml   Filed Weights   07/25/19 0745  Weight: 107 kg    Examination:  General exam: Appears calm and comfortable  Respiratory system: Clear to auscultation. Respiratory effort normal. Cardiovascular system: S1 & S2 heard, RRR. No JVD, murmurs, rubs, gallops or clicks. No pedal edema. Gastrointestinal system: Abdomen is nondistended, soft and nontender. No organomegaly or masses felt. Normal bowel sounds heard. Central nervous system: Alert and oriented. No focal neurological deficits. Extremities: Symmetric 5 x 5 power. Skin: No rashes, lesions or ulcers Psychiatry: Judgement and insight appear normal. Mood & affect appropriate.  Right upper quadrant drain noted   Data Reviewed:   CBC: Recent Labs  Lab 07/23/19 1142 07/24/19 0154 07/27/19 0527  WBC 4.6 4.9 8.0  NEUTROABS 3.0  --   --   HGB 13.7 12.4* 12.6*  HCT 39.0 34.9* 35.7*  MCV 97.3 96.4  97.5  PLT 228 197 301   Basic Metabolic Panel: Recent Labs  Lab 07/23/19 1142 07/24/19 0154 07/25/19 0231 07/26/19 0250 07/27/19 0527  NA 134* 133* 134* 130* 132*  K 3.8 4.2 4.2 3.8 4.0  CL 99 99 101 100 99  CO2 _0 GLUCOSE 125* 131* 130* 172* 145*  BUN 7 6 5* 10 13  CREATININE 0.72 0.68 0.79 0.76 0.79  CALCIUM 9.3 8.7* 8.5* 8.6* 8.5*   GFR: Estimated Creatinine Clearance: 122.2 mL/min (by C-G formula based on SCr of 0.79 mg/dL). Liver Function Tests: Recent Labs  Lab 07/23/19 1142 07/24/19 0154 07/25/19 0231 07/26/19 0250 07/27/19 0527  AST 114* 95* 82* 97* 78*  ALT 164* 139* 130* 131* 128*  ALKPHOS 300* 242* 251* 270* 246*  BILITOT 13.6* 11.6* 11.7* 11.3* 6.9*  PROT 7.2 6.5 6.3* 6.5 6.5  ALBUMIN 3.9 3.3* 3.2* 3.1* 3.1*   Recent Labs  Lab 07/23/19 1142 07/25/19 0231 07/26/19 0250 07/27/19 0527  LIPASE 297* 781* 581* 2,085*   No results for input(s): AMMONIA in the last 168 hours. Coagulation Profile: Recent Labs  Lab 07/23/19 1100 07/25/19 0231 07/26/19 0250  INR 1.1 1.2 1.2   Cardiac Enzymes: No results for input(s): CKTOTAL, CKMB, CKMBINDEX, TROPONINI in the last 168 hours. BNP (last 3 results) No results for input(s): PROBNP in the last 8760 hours. HbA1C: No results for input(s): HGBA1C in the last 72 hours. CBG: No results for input(s): GLUCAP in the last 168 hours. Lipid Profile: No results for input(s): CHOL, HDL, LDLCALC, TRIG, CHOLHDL, LDLDIRECT in the last 72 hours. Thyroid Function Tests: No results for input(s): TSH, T4TOTAL, FREET4, T3FREE, THYROIDAB in the last 72 hours. Anemia Panel: No results for input(s): VITAMINB12, FOLATE, FERRITIN, TIBC, IRON, RETICCTPCT in the last 72 hours. Sepsis Labs: No results for input(s): PROCALCITON, LATICACIDVEN in the last 168 hours.  Recent Results (from the past 240 hour(s))  SARS CORONAVIRUS 2 (TAT 6-24 HRS) Nasopharyngeal Nasopharyngeal Swab     Status: None   Collection Time: 07/24/19  3:35 PM   Specimen: Nasopharyngeal Swab  Result Value Ref Range Status   SARS Coronavirus 2 NEGATIVE NEGATIVE Final    Comment: (NOTE) SARS-CoV-2 target nucleic acids are NOT DETECTED. The SARS-CoV-2 RNA is generally  detectable in upper and lower respiratory specimens during the acute phase of infection. Negative results do not preclude SARS-CoV-2 infection, do not rule out co-infections with other pathogens, and should not be used as the sole basis for treatment or other patient management decisions. Negative results must be combined with clinical observations, patient history, and epidemiological information. The expected result is Negative. Fact Sheet for Patients: SugarRoll.be Fact Sheet for Healthcare Providers: https://www.woods-mathews.com/ This test is not yet approved or cleared by the [place] FDA and  has been authorized for detection and/or diagnosis of SARS-CoV-2 by FDA under an Emergency Use Authorization (EUA). This EUA will remain  in effect (meaning this test can be used) for the duration of the COVID-19 declaration under Section 56 4(b)(1) of the Act, 21 U.S.C. section 360bbb-3(b)(1), unless the authorization is terminated or revoked sooner. Performed at [facility], [place] [address], [place], [place] [address]   Aerobic/Anaerobic Culture (surgical/deep wound)     Status: None (Preliminary result)   Collection Time: 07/26/19 10:43 AM   Specimen: BILE  Result Value Ref Range Status   Specimen Description BILE  Final   Special Requests Normal  Final   Gram Stain NO  WBC SEEN NO ORGANISMS SEEN   Final   Culture   Final    CULTURE REINCUBATED FOR BETTER GROWTH Performed at [facility], [place] [address] [place], [place] [address]    Report Status PENDING  Incomplete         Radiology Studies: Ir Int Lianne Cure Biliary Drain With Cholangiogram  Result Date: 07/26/2019 INDICATION: Obstructive jaundice, elevated LFTs, and ampullary/pancreatic head mass, failed ERCP EXAM: ULTRASOUND AND FLUOROSCOPIC RIGHT PTC WITH INTERNAL EXTERNAL BILIARY DRAIN MEDICATIONS: 3.375 g Zosyn; The antibiotic was administered within an appropriate  time frame prior to the initiation of the procedure. ANESTHESIA/SEDATION: Moderate (conscious) sedation was employed during this procedure. A total of Versed 3.0 mg and Fentanyl 200 mcg was administered intravenously. Moderate Sedation Time: 35 minutes. The patient's level of consciousness and vital signs were monitored continuously by radiology nursing throughout the procedure under my direct supervision. FLUOROSCOPY TIME:  Fluoroscopy Time: 4 minutes 18 seconds (94 mGy). COMPLICATIONS: None immediate. PROCEDURE: Informed written consent was obtained from the patient after a thorough discussion of the procedural risks, benefits and alternatives. All questions were addressed. Maximal Sterile Barrier Technique was utilized including caps, mask, sterile gowns, sterile gloves, sterile drape, hand hygiene and skin antiseptic. A timeout was performed prior to the initiation of the procedure. Previous imaging reviewed. In the right mid axillary line through a lower intercostal space, ultrasound percutaneous needle access eventually performed of a peripheral right hepatic duct. There was return of blood tinged bile. Contrast injection performed for limited cholangiogram. Biliary obstruction noted with mild dilatation. 018 guidewire advanced centrally into the common bile duct followed by the Accustick dilator set. Amplatz guidewire exchange performed. Kumpe catheter advanced through the distal CBD obstruction with a Glidewire. Catheter advanced into the duodenum. Contrast injection confirms position. Amplatz guidewire inserted. Tract dilatation performed to insert a 10 [place] internal external biliary drain with the distal loop in the duodenum. Biliary system decompressed by syringe aspiration yielding 100 cc bile. Sample sent for culture. Images obtained for documentation. Catheter secured with Prolene suture and external gravity drainage bag. Sterile dressing applied. No immediate complication. Patient tolerated the  procedure well. IMPRESSION: Successful right PTC with 10 [place] internal external biliary drain insertion for distal CBD obstruction Electronically Signed   By: Jerilynn Mages.  Shick M.D.   On: 07/26/2019 11:08        Scheduled Meds: . allopurinol  300 mg Oral Daily  . pantoprazole  40 mg Oral BID  . polyethylene glycol  17 g Oral Daily  . sodium chloride flush  3 mL Intravenous Q12H  . sodium chloride flush  5 mL Intracatheter Q8H   Continuous Infusions: . sodium chloride 125 mL/hr at 07/27/19 1532     LOS: 5 days   Time spent= 20 mins    Avira Tillison Arsenio Loader, MD [facility]  If 7PM-7AM, please contact night-coverage  07/28/2019, 8:19 AM

## 2019-07-28 NOTE — Progress Notes (Signed)
Referring Physician(s): Dr. Loletha Carrow  Supervising Physician: Sandi Mariscal  Patient Status:  [facility] - In-pt  Chief Complaint: Obstructive jaundice   Subjective: 63 y.o. male. History jaundice found to have a pancreatic mass s/p ERCP biopsy on 11.21.20 (cytology pending). IR placed a biliary drain on 11.22.20.    Allergies: Patient has no known allergies.  Medications: Prior to Admission medications   Medication Sig Start Date End Date Taking? Authorizing Provider  allopurinol (ZYLOPRIM) 300 MG tablet Take 300 mg by mouth daily.   Yes [provider]     Vital Signs: BP 131/74 (BP Location: Left Arm)    Pulse 72    Temp 97.9 F (36.6 C) (Oral)    Resp 18    Ht 5' 11"  (1.803 m)    Wt 236 lb (107 kg)    SpO2 97%    BMI 32.92 kg/m   Physical Exam Vitals signs and nursing note reviewed.  Constitutional:      Appearance: He is well-developed.  HENT:     Head: Normocephalic.  Neck:     Musculoskeletal: Normal range of motion.  Pulmonary:     Effort: Pulmonary effort is normal.  Musculoskeletal: Normal range of motion.  Skin:    General: Skin is warm and dry.     Comments: drain site is unremarkable with no erythema, tenderness or drainage noted. Suture and stat lock in place. Dressing is clean dry and intact. 50 ml of dark brown colored fluid noted in gravity bag.   Neurological:     Mental Status: He is alert and oriented to person, place, and time.     Imaging: Ct Chest W Contrast  Result Date: 07/24/2019 CLINICAL DATA:  Findings on previous day's abdomen and pelvis CT consistent with pancreatic carcinoma and metastatic disease. EXAM: CT CHEST WITH CONTRAST TECHNIQUE: Multidetector CT imaging of the chest was performed during intravenous contrast administration. CONTRAST:  41m OMNIPAQUE IOHEXOL 300 MG/ML  SOLN COMPARISON:  Abdomen and pelvis CT, 07/23/2019. FINDINGS: Cardiovascular: Heart is normal in size and configuration. No pericardial effusion 2 vessel  coronary artery calcifications. Great vessels are normal in caliber. Tiny focus of atherosclerotic calcification along the descending thoracic aorta. Mediastinum/Nodes: Prominent thyroid gland with small calcifications, no defined nodule. No mediastinal or hilar masses. No enlarged lymph nodes. Trachea and esophagus are unremarkable. Lungs/Pleura: Lungs are clear. No pleural effusion or pneumothorax. Upper Abdomen: No acute finding or change from the previous day's abdominopelvic CT Musculoskeletal: No fracture or acute finding.  No bone lesion. IMPRESSION: 1. No acute findings in the chest. 2. No evidence of metastatic disease or a primary neoplasm in the chest. 3. Prominent thyroid gland with calcifications. Consider non urgent follow-up thyroid ultrasound further assessment. 4. Two vessel coronary artery calcifications and minimal aortic atherosclerosis. Aortic Atherosclerosis (ICD10-I70.0). Electronically Signed   By: DLajean ManesM.D.   On: 07/24/2019 16:54   Dg Ercp Biliary & Pancreatic Ducts  Result Date: 07/25/2019 CLINICAL DATA:  Obstructive jaundice, concern for pancreas mass EXAM: ERCP unsuccessful TECHNIQUE: Multiple spot images obtained with the fluoroscopic device and submitted for interpretation post-procedure. FLUOROSCOPY TIME:  Fluoroscopy Time:  52 seconds Number of Acquired Spot Images: 1 COMPARISON:  07/23/2019 FINDINGS: Single spot fluoroscopic view demonstrates the endoscope. Biliary tree not opacified. IMPRESSION: Nonvisualization of the biliary tree.  Unsuccessful ERCP. These images were submitted for radiologic interpretation only. Please see the procedural report for the amount of contrast and the fluoroscopy time utilized. Electronically Signed   By:  M.  Shick M.D.   On: 07/25/2019 09:32   Ir Int Lianne Cure Biliary Drain With Cholangiogram  Result Date: 07/26/2019 INDICATION: Obstructive jaundice, elevated LFTs, and ampullary/pancreatic head mass, failed ERCP EXAM: ULTRASOUND AND  FLUOROSCOPIC RIGHT PTC WITH INTERNAL EXTERNAL BILIARY DRAIN MEDICATIONS: 3.375 g Zosyn; The antibiotic was administered within an appropriate time frame prior to the initiation of the procedure. ANESTHESIA/SEDATION: Moderate (conscious) sedation was employed during this procedure. A total of Versed 3.0 mg and Fentanyl 200 mcg was administered intravenously. Moderate Sedation Time: 35 minutes. The patient's level of consciousness and vital signs were monitored continuously by radiology nursing throughout the procedure under my direct supervision. FLUOROSCOPY TIME:  Fluoroscopy Time: 4 minutes 18 seconds (94 mGy). COMPLICATIONS: None immediate. PROCEDURE: Informed written consent was obtained from the patient after a thorough discussion of the procedural risks, benefits and alternatives. All questions were addressed. Maximal Sterile Barrier Technique was utilized including caps, mask, sterile gowns, sterile gloves, sterile drape, hand hygiene and skin antiseptic. A timeout was performed prior to the initiation of the procedure. Previous imaging reviewed. In the right mid axillary line through a lower intercostal space, ultrasound percutaneous needle access eventually performed of a peripheral right hepatic duct. There was return of blood tinged bile. Contrast injection performed for limited cholangiogram. Biliary obstruction noted with mild dilatation. 018 guidewire advanced centrally into the common bile duct followed by the Accustick dilator set. Amplatz guidewire exchange performed. Kumpe catheter advanced through the distal CBD obstruction with a Glidewire. Catheter advanced into the duodenum. Contrast injection confirms position. Amplatz guidewire inserted. Tract dilatation performed to insert a 10 [place] internal external biliary drain with the distal loop in the duodenum. Biliary system decompressed by syringe aspiration yielding 100 cc bile. Sample sent for culture. Images obtained for documentation. Catheter  secured with Prolene suture and external gravity drainage bag. Sterile dressing applied. No immediate complication. Patient tolerated the procedure well. IMPRESSION: Successful right PTC with 10 [place] internal external biliary drain insertion for distal CBD obstruction Electronically Signed   By: Jerilynn Mages.  Shick M.D.   On: 07/26/2019 11:08    Labs:  CBC: Recent Labs    07/23/19 1142 07/24/19 0154 07/27/19 0527  WBC 4.6 4.9 8.0  HGB 13.7 12.4* 12.6*  HCT 39.0 34.9* 35.7*  PLT 228 197 241    COAGS: Recent Labs    07/23/19 1100 07/25/19 0231 07/26/19 0250  INR 1.1 1.2 1.2    BMP: Recent Labs    07/24/19 0154 07/25/19 0231 07/26/19 0250 07/27/19 0527  NA 133* 134* 130* 132*  K 4.2 4.2 3.8 4.0  CL 99 101 100 99  CO2 24 22 22 26   GLUCOSE 131* 130* 172* 145*  BUN 6 5* 10 13  CALCIUM 8.7* 8.5* 8.6* 8.5*  CREATININE 0.68 0.79 0.76 0.79  GFRNONAA >60 >60 >60 >60  GFRAA >60 >60 >60 >60    LIVER FUNCTION TESTS: Recent Labs    07/24/19 0154 07/25/19 0231 07/26/19 0250 07/27/19 0527  BILITOT 11.6* 11.7* 11.3* 6.9*  AST 95* 82* 97* 78*  ALT 139* 130* 131* 128*  ALKPHOS 242* 251* 270* 246*  PROT 6.5 6.3* 6.5 6.5  ALBUMIN 3.3* 3.2* 3.1* 3.1*    Assessment and Plan: 63 y.o. male. History jaundice found to have a pancreatic mass s/p ERCP biopsy on 11.21.20 (cytology pending). IR placed a biliary drain on 11.22.20. Drain site is unremarkable with no erythema, tenderness or drainage noted. Suture and stat lock in place. Dressing is clean dry and  intact. 50 ml of dark brown colored fluid noted in gravity bag.   Per Epic output is:  125 ml, 1450 ml, 1205 ml  Total bilirubin is 6.9 (downtrending from 11.3)  Recommend team continue with flushing TID, output recording q shift and dressing changes as needed. Patient to be scheduled for cholangiogram and possible capping as outpatient. Order placed to facilitate.     Electronically Signed: Avel Peace,  NP 07/28/2019, 9:16 AM   I spent a total of 15 Minutes at the the patient's bedside AND on the patient's hospital floor or unit, greater than 50% of which was counseling/coordinating care for bilary drain.

## 2019-07-28 NOTE — Progress Notes (Cosign Needed)
Patient ID: Jeffrey Harris, male   DOB: 1956-07-23, 63 y.o.   MRN: [id number]   Pt was tentatively scheduled for PAC placement in IR today  We have to reschedule this to 11/25- secondary IR schedule  RN aware  This procedure can be scheduled as OP if needed

## 2019-07-28 NOTE — Plan of Care (Signed)

## 2019-07-28 NOTE — Progress Notes (Signed)
Pathology shows poorly differentiated adenocarcinoma. Result reviewed with patient and his wife at bedside. IR to direct his biliary drain, biliary stent management. Dr. Marin Olp, Oncology, following. Tentative plans are for discharge tomorrow after IR procedure completed. GI signing off, available if needed. Outpatient GI follow up with Dr. Loletha Carrow if needed.

## 2019-07-29 ENCOUNTER — Other Ambulatory Visit: Payer: Self-pay | Admitting: Radiology

## 2019-07-29 ENCOUNTER — Encounter: Payer: Self-pay | Admitting: *Deleted

## 2019-07-29 ENCOUNTER — Encounter: Payer: Self-pay | Admitting: Hematology & Oncology

## 2019-07-29 ENCOUNTER — Other Ambulatory Visit: Payer: Self-pay | Admitting: *Deleted

## 2019-07-29 ENCOUNTER — Other Ambulatory Visit: Payer: Self-pay | Admitting: Hematology & Oncology

## 2019-07-29 DIAGNOSIS — C787 Secondary malignant neoplasm of liver and intrahepatic bile duct: Secondary | ICD-10-CM | POA: Insufficient documentation

## 2019-07-29 DIAGNOSIS — C259 Malignant neoplasm of pancreas, unspecified: Secondary | ICD-10-CM

## 2019-07-29 DIAGNOSIS — E871 Hypo-osmolality and hyponatremia: Secondary | ICD-10-CM

## 2019-07-29 DIAGNOSIS — Z7189 Other specified counseling: Secondary | ICD-10-CM

## 2019-07-29 HISTORY — DX: Malignant neoplasm of pancreas, unspecified: C25.9

## 2019-07-29 HISTORY — DX: Other specified counseling: Z71.89

## 2019-07-29 HISTORY — DX: Secondary malignant neoplasm of liver and intrahepatic bile duct: C78.7

## 2019-07-29 LAB — COMPREHENSIVE METABOLIC PANEL
ALT: 84 U/L — ABNORMAL HIGH (ref 0–44)
AST: 51 U/L — ABNORMAL HIGH (ref 15–41)
Albumin: 2.7 g/dL — ABNORMAL LOW (ref 3.5–5.0)
Alkaline Phosphatase: 169 U/L — ABNORMAL HIGH (ref 38–126)
Anion gap: 9 (ref 5–15)
BUN: 10 mg/dL (ref 6–20)
CO2: 22 mmol/L (ref 22–32)
Calcium: 8.2 mg/dL — ABNORMAL LOW (ref 8.9–10.3)
Chloride: 103 mmol/L (ref 98–111)
Creatinine, Ser: 0.69 mg/dL (ref 0.61–1.24)
GFR calc Af Amer: >60 mL/min (ref >60)
GFR calc non Af Amer: >60 mL/min (ref >60)
Glucose, Bld: 131 mg/dL — ABNORMAL HIGH (ref 70–99)
Potassium: 3.8 mmol/L (ref 3.5–5.1)
Sodium: 134 mmol/L — ABNORMAL LOW (ref 135–145)
Total Bilirubin: 6 mg/dL — ABNORMAL HIGH (ref 0.3–1.2)
Total Protein: 6.1 g/dL — ABNORMAL LOW (ref 6.5–8.1)

## 2019-07-29 LAB — CBC
HCT: 32.6 % — ABNORMAL LOW (ref 39.0–52.0)
Hemoglobin: 11.6 g/dL — ABNORMAL LOW (ref 13.0–17.0)
MCH: 34.4 pg — ABNORMAL HIGH (ref 26.0–34.0)
MCHC: 35.6 g/dL (ref 30.0–36.0)
MCV: 96.7 fL (ref 80.0–100.0)
Platelets: 227 10*3/uL (ref 150–400)
RBC: 3.37 MIL/uL — ABNORMAL LOW (ref 4.22–5.81)
RDW: 12.8 % (ref 11.5–15.5)
WBC: 5.6 10*3/uL (ref 4.0–10.5)
nRBC: 0 % (ref 0.0–0.2)

## 2019-07-29 LAB — MAGNESIUM: Magnesium: 2.1 mg/dL (ref 1.7–2.4)

## 2019-07-29 MED ORDER — OXYCODONE HCL 5 MG PO TABS: 10.0000 mg | ORAL_TABLET | Freq: Four times a day (QID) | ORAL | 0 refills | Status: AC | PRN

## 2019-07-29 MED ORDER — ZOLPIDEM TARTRATE 5 MG PO TABS: 5.0000 mg | ORAL_TABLET | Freq: Every evening | ORAL | 0 refills | Status: DC | PRN

## 2019-07-29 MED ORDER — PANTOPRAZOLE SODIUM 40 MG PO TBEC: 40.0000 mg | DELAYED_RELEASE_TABLET | Freq: Two times a day (BID) | ORAL | 0 refills | Status: DC

## 2019-07-29 NOTE — Discharge Instructions (Addendum)
Biliary Drainage Catheter Home Guide A biliary drainage catheter is a thin, flexible tube that is inserted through your skin into the bile ducts in your liver. Bile is a thick yellow or green fluid that helps digest fat in foods. The purpose of a biliary drainage catheter is to keep bile from backing up into your liver. Backup of bile can occur when there is a blockage that prevents bile from moving from the bile ducts into the small intestine as it should. The blockage can be caused by gallstones, a tumor, or scar tissue. There are three types of biliary drainage:  External biliary drainage. With this type, bile is only drained into a collection bag outside your body (external collection bag).  Internal-external biliary drainage. With this type, bile is drained to an external collection bag as well as into your small intestine.  Internal biliary drainage. With this type, bile is only drained into your small intestine. General home care includes these daily actions:   Inspection of your drainage catheter.  Flushing your drainage catheter with saline.  Emptying drainage from the collection bag (if present).  Recording the amount of drainage.  Checking the catheter insertion site for signs of infection. Check for: ? Redness, swelling, or pain. ? Fluid or blood. ? Warmth. ? Pus or a bad smell. How do I inspect my drainage catheter?  Check the dressing to make sure that it is dry and clean.  Look at the skin around the drainage catheter when changing the dressing for any problems such as redness, rash, or skin breakdown.  Check the drainage bag to make sure that drainage fluid is flowing into the bag well. Note the color and amount compared to other days.  Check the drainage catheter and bag for any cracks or kinks in the tubing. How do I change my dressing? The dressing over the drainage catheter should be changed every other day, or more often if needed to keep the dressing dry. Your  health care provider will instruct you about how often to change your dressing. Supplies needed:  Mild soap and warm water.  Split gauze pads, 4 x 4 inches (10 x 10 cm) to use as a dressing sponge.  Gauze pads, 4 x 4 inches (10 x 10 cm) or adhesive dressing cover.  Paper tape. How to change the dressing: 1. Wash your hands with soap and water. 2. Gently remove the old dressing. Avoid using scissors to remove the dressing because they may damage the drainage catheter. 3. Wash the skin around the insertion site with mild soap and warm water, rinse well, then pat the area dry with a clean cloth. 4. Check the skin around the drainage catheter for redness or swelling, or for yellow or green discharge that has a bad smell. 5. If the drainage catheter was stitched (sutured) to the skin, inspect the suture to make sure it is still anchored in the skin. 6. Do not apply creams, ointments, or alcohol to the site. Allow the skin to air-dry completely before you apply a new dressing. 7. Place the drainage catheter through the slit in a dressing sponge. The dressing sponge should slide under the disk that holds the drainage catheter in place. 8. Cover the drainage catheter and the dressing sponge with a 4 x 4 inch (10 x 10 cm) gauze. The drainage catheter should rest on the gauze and not on the skin. 9. Tape the dressing to the skin. 10. You may be instructed to use an  adhesive dressing covering over the top of this in place of the gauze and tape. °11. Wash your hands with soap and water. °How do I flush my drainage catheter? °Biliary drainagecatheters should be flushed daily, or as often as told by your health care provider. The end of the drainage catheter is closed using an IV cap. A syringe can be directly connected to the IV cap. °Supplies needed: °· Alcohol swab. °· 10 mL prefilled normal saline syringe. °How to flush the drainage catheter: °1. Wash your hands with soap and water. °2. If your drainage  catheter has a stopcock attached to it, turn the stopcock toward the drainage bag. This will allow the saline to flow in the direction of your body. °3. Clean the IV cap with an alcohol swab. °4. Screw the tip of a 10 mL normal saline syringe onto the IV cap. °5. Inject the saline over 5-10 seconds. If you feel resistance while injecting, stop immediately. Avoid  pulling back on the plunger. Doing that could increase your risk of infection. °6. Remove the syringe from the cap. Turn the stopcock so that fluid flows from your body into the drainage bag. You may notice more fluid flowing into the bag after you have completed the flush. °How do I attach a bag to my drainage catheter? °If you are having trouble with your internal biliary drain, you may be directed by your health care provider to use bag drainage until you can be seen to fix the problem. For this reason, you should always have a collection bag and connecting tubing at home. If you do not have these supplies, remember to ask for them at your next appointment. °1. Remove the bag and the connecting tubing from their packaging. °2. Connect the funnel end of the tubing to the bag's cone-shaped stem. °3. Remove the IV cap from the biliary drain. To do this, unscrew it and replace it with the screw-on end of the tubing. °4. Save the IV cap in a plastic storage bag that can be sealed. °How do I empty my collection bag? °Empty the collection bag whenever it becomes 2/3 full. Also empty it before you go to sleep. Most collection bags have a drainage valve at the bottom so the bag can be that allows them to be emptied easily. °1. Wash your hands with soap and water. °2. Hold the collection bag over the toilet, basin, or collection container. Use a measuring container if your health care provider told you to measure the drainage. °3. Unscrew the valve to open it, and allow the bag to drain. °4. Close the valve securely to avoid leakage. °5. Use a tissue or disposable  napkin to wipe the valve clean. °6. Wash the measuring container with soap and water. °7. Record the amount of drainage as told by your health care provider. °Contact a health care provider if: °· Your pain gets worse after it had improved, and it is not relieved with pain medicines. °· You have any questions about caring for your drainage catheter or collection bag. °· You have any of these around your catheter insertion site or coming from it: °? Skin breakdown. °? Redness, swelling, or pain. °? Fluid or blood. °? Warmth to the touch. °? Pus or a bad smell. °Get help right away if: °· You have a fever or chills. °· Your redness, swelling, or pain at the catheter insertion site gets worse, even though you are cleaning it well. °· You have leakage of   bile around the drainage catheter. °· Your drainage catheter becomes blocked or clogged. °· Your drainage catheter comes out. °This information is not intended to replace advice given to you by your health care provider. Make sure you discuss any questions you have with your health care provider. °Document Released: 06/10/2013 Document Revised: 08/02/2017 Document Reviewed: 07/09/2016 °Elsevier Patient Education © 2020 [employer] ° °

## 2019-07-29 NOTE — Plan of Care (Signed)
  Problem: Education: Goal: Knowledge of General Education information will improve Description: Including pain rating scale, medication(s)/side effects and non-pharmacologic comfort measures Outcome: Progressing   Problem: Health Behavior/Discharge Planning: Goal: Ability to manage health-related needs will improve Outcome: Progressing   Problem: Clinical Measurements: Goal: Ability to maintain clinical measurements within normal limits will improve Outcome: Progressing Goal: Respiratory complications will improve Outcome: Progressing Goal: Cardiovascular complication will be avoided Outcome: Progressing   Problem: Activity: Goal: Risk for activity intolerance will decrease Outcome: Progressing   Problem: Pain Managment: Goal: General experience of comfort will improve Outcome: Progressing   Problem: Safety: Goal: Ability to remain free from injury will improve Outcome: Progressing   Problem: Skin Integrity: Goal: Risk for impaired skin integrity will decrease Outcome: Progressing   

## 2019-07-29 NOTE — Discharge Summary (Addendum)
Physician Discharge Summary  Jeffrey Harris TIW:[id number] DOB: 05/18/1956 DOA: 07/23/2019  PCP: [name], MD  Admit date: 07/23/2019 Discharge date: 07/29/2019  Time spent: 45 minutes  Recommendations for Outpatient Follow-up:  Patient will be discharged to home.  Patient will need to follow up with primary care provider within one week of discharge.  Follow up with Dr. Marin Olp, oncology.  Follow up with interventional radiology. Patient should continue medications as prescribed.  Patient should follow a regular diet.   Discharge Diagnoses:  Pancreatic mass/adenocarcinoma with elevated LFTs Hyperbilirubinemia Gout Insomnia  Discharge Condition: Stable  Diet recommendation: regular  Filed Weights   07/25/19 0745  Weight: 107 kg    History of present illness:  On 07/23/2019 by Dr. Fuller Plan Jeffrey Harris is a 63 y.o. male with medical history significant of gout.  He presents with complaints of right upper quadrant fullness over the last 3 weeks.  He describes it as an uncomfortable feeling that disturb his sleep.  Noted associated symptoms of loose stools and hemorrhoid. Denies having any nausea, vomiting, fever, shortness of breath, cough, or dysuria.  He has intentionally lost around 30 pounds from last year after being told that he was borderline diabetic.  He admits to drinking alcohol only occasionally and not on a daily basis.  He has used Tylenol PM to help him sleep for the last 2 months but he is not taking more than the recommended amount.  Over the last day he noticed that his eyes were turning yellow and asked when he went to his primary care provider to do lab work.  Lab work was noted to be abnormal and he was sent here to the emergency department for further evaluation.  Hospital Course:  Pancreatic mass/adenocarcinoma with elevated LFTs -Gastroenterology consulted and appreciated, EGD normal esophagus, stomach.  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   ERCP was attempted -Interventional radiology consulted and appreciated, status post right PTC and internal/external drain through to the duodenum.  IR recommending flushing 3 times daily, patient to be scheduled for cholangiogram and possible capping as an outpatient -Biopsy showed poorly differentiated adenocarcinoma -Oncology consulted and appreciated -IR also consulted for Port-A-Cath placement which will be done as an outpatient. -Lipase and LFTs trending downward -given script for pain meds- [place] Controlled substance database reviewed  Hyperbilirubinemia -Secondary to obstructive pancreatic mass of the head -Drain in place  Gout -Continue allopurinol  Insomnia -Discharged with ambien   Procedures: ERCP attempted Right PTC and internal/external drain through to the duodenum by IR  Consultations: Gastroenterology Interventional radiology Oncology  Discharge Exam: Vitals:   07/29/19 0440 07/29/19 1326  BP: 116/78 127/86  Pulse: 73 70  Resp: 15 19  Temp: 98.6 F (37 C) 98.2 F (36.8 C)  SpO2: 97% 98%     General: Well developed, well nourished, NAD, appears stated age  HEENT: NCAT, mucous membranes moist.  Cardiovascular: S1 S2 auscultated, RRR  Respiratory: Clear to auscultation bilaterally with equal chest rise  Abdomen: Soft, nondistended, + bowel sounds, drain in place  Extremities: warm dry without cyanosis clubbing or edema  Neuro: AAOx3, nonfocal  Psych: Appropriate mood and affect, pleasant   Discharge Instructions Discharge Instructions    Discharge instructions   Complete by: As directed    Patient will be discharged to home.  Patient will need to follow up with primary care provider within one week of discharge.  Follow up with Dr. Marin Olp, oncology. Follow up with interventional radiology. Patient should continue medications as  prescribed.  Patient should follow a regular diet.       Allergies as of 07/29/2019   No Known Allergies       Medication List    TAKE these medications   allopurinol 300 MG tablet Commonly known as: ZYLOPRIM Take 300 mg by mouth daily.   oxyCODONE 5 MG immediate release tablet Commonly known as: Oxy IR/ROXICODONE Take 2 tablets (10 mg total) by mouth every 6 (six) hours as needed for up to 5 days for moderate pain or severe pain.   pantoprazole 40 MG tablet Commonly known as: PROTONIX Take 1 tablet (40 mg total) by mouth 2 (two) times daily.   zolpidem 5 MG tablet Commonly known as: AMBIEN Take 1 tablet (5 mg total) by mouth at bedtime as needed for sleep.      No Known Allergies Follow-up Information    [name], MD. Schedule an appointment as soon as possible for a visit in 1 week(s).   Specialty: Internal Medicine Why: Hospital follow up Contact information: [address] [place] [address] [phone]        Volanda Napoleon, MD. Schedule an appointment as soon as possible for a visit in 1 week(s).   Specialty: Oncology Why: Hospital follow up Contact information: [address] [place] [address] [phone]        Greggory Keen, MD. Go in 6 week(s).   Specialties: Interventional Radiology, Radiology Why: Clinic will call to arrange follow up appt Contact information: [place] [address] [phone]            The results of significant diagnostics from this hospitalization (including imaging, microbiology, ancillary and laboratory) are listed below for reference.    Significant Diagnostic Studies: Ct Chest W Contrast  Result Date: 07/24/2019 CLINICAL DATA:  Findings on previous day's abdomen and pelvis CT consistent with pancreatic carcinoma and metastatic disease. EXAM: CT CHEST WITH CONTRAST TECHNIQUE: Multidetector CT imaging of the chest was performed during intravenous contrast administration. CONTRAST:  57m OMNIPAQUE IOHEXOL 300 MG/ML  SOLN COMPARISON:  Abdomen and  pelvis CT, 07/23/2019. FINDINGS: Cardiovascular: Heart is normal in size and configuration. No pericardial effusion 2 vessel coronary artery calcifications. Great vessels are normal in caliber. Tiny focus of atherosclerotic calcification along the descending thoracic aorta. Mediastinum/Nodes: Prominent thyroid gland with small calcifications, no defined nodule. No mediastinal or hilar masses. No enlarged lymph nodes. Trachea and esophagus are unremarkable. Lungs/Pleura: Lungs are clear. No pleural effusion or pneumothorax. Upper Abdomen: No acute finding or change from the previous day's abdominopelvic CT Musculoskeletal: No fracture or acute finding.  No bone lesion. IMPRESSION: 1. No acute findings in the chest. 2. No evidence of metastatic disease or a primary neoplasm in the chest. 3. Prominent thyroid gland with calcifications. Consider non urgent follow-up thyroid ultrasound further assessment. 4. Two vessel coronary artery calcifications and minimal aortic atherosclerosis. Aortic Atherosclerosis (ICD10-I70.0). Electronically Signed   By: DLajean ManesM.D.   On: 07/24/2019 16:54   Ct Abdomen Pelvis W Contrast  Result Date: 07/24/2019 CLINICAL DATA:  Painless jaundice for 1 week. Unintentional weight loss over past 3 months. EXAM: CT ABDOMEN AND PELVIS WITH CONTRAST TECHNIQUE: Multidetector CT imaging of the abdomen and pelvis was performed using the standard protocol following bolus administration of intravenous contrast. CONTRAST:  1015mOMNIPAQUE IOHEXOL 300 MG/ML  SOLN COMPARISON:  None. FINDINGS: Lower Chest: No acute findings. Hepatobiliary: Multiple small hypovascular lesions are seen throughout  the right and left lobes. Largest lesion is seen in segment 4A, measuring 2.1 x 1.5 cm on image 12/3. These are consistent with diffuse pulmonary metastases. The gallbladder is distended, however there are no signs of acute cholecystitis. Diffuse biliary ductal dilatation is seen to the level of the  pancreatic head. Pancreas: Subtle ill-defined soft tissue prominence is seen in the pancreatic head which measures 2.8 x 2.6 cm, and is suspicious for pancreatic carcinoma. No evidence of pancreatic ductal dilatation or peripancreatic inflammatory changes. Spleen: Within normal limits in size and appearance. Adrenals/Urinary Tract: A 2.4 cm fat attenuation mass is seen in the left adrenal gland, consistent with a benign myelolipoma. The right adrenal gland and left kidney are normal appearance. Right-sided pelvic kidney is noted which is otherwise unremarkable in appearance. No evidence of renal mass or hydronephrosis. Unremarkable unopacified urinary bladder. Stomach/Bowel: No evidence of obstruction, inflammatory process or abnormal fluid collections. Normal appendix visualized. Vascular/Lymphatic: A 1.3 cm peripancreatic lymph node is seen just posterior to the portal venous confluence on image 19/3. A 1.4 cm porta hepatis lymph node is seen on image 17/3. 8 mm lymph node is also seen adjacent to the GE junction on image 12/13. No other pathologically enlarged lymph nodes are identified. There is no evidence of vascular involvement of SMA, SMV, or portal vein. Reproductive:  No mass or other significant abnormality. Other:  None. Musculoskeletal:  No suspicious bone lesions identified. IMPRESSION: 1. Diffuse biliary ductal dilatation, with approximately 2.8 cm soft tissue prominence in the pancreatic head, highly suspicious for primary pancreatic carcinoma. Recommend abdomen MRI and MRCP without and with contrast for further evaluation. 2. Diffuse liver metastases. 3. Mild peripancreatic and porta hepatis lymphadenopathy, suspicious for metastatic disease. 4. 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 Electronically Signed   By: Marlaine Hind M.D.   On: 07/24/2019 07:50   Dg Ercp Biliary & Pancreatic Ducts  Result Date: 07/25/2019 CLINICAL DATA:  Obstructive jaundice, concern for  pancreas mass EXAM: ERCP unsuccessful TECHNIQUE: Multiple spot images obtained with the fluoroscopic device and submitted for interpretation post-procedure. FLUOROSCOPY TIME:  Fluoroscopy Time:  52 seconds Number of Acquired Spot Images: 1 COMPARISON:  07/23/2019 FINDINGS: Single spot fluoroscopic view demonstrates the endoscope. Biliary tree not opacified. IMPRESSION: Nonvisualization of the biliary tree.  Unsuccessful ERCP. These images were submitted for radiologic interpretation only. Please see the procedural report for the amount of contrast and the fluoroscopy time utilized. Electronically Signed   By: Jerilynn Mages.  Shick M.D.   On: 07/25/2019 09:32   Ir Int Lianne Cure Biliary Drain With Cholangiogram  Result Date: 07/26/2019 INDICATION: Obstructive jaundice, elevated LFTs, and ampullary/pancreatic head mass, failed ERCP EXAM: ULTRASOUND AND FLUOROSCOPIC RIGHT PTC WITH INTERNAL EXTERNAL BILIARY DRAIN MEDICATIONS: 3.375 g Zosyn; The antibiotic was administered within an appropriate time frame prior to the initiation of the procedure. ANESTHESIA/SEDATION: Moderate (conscious) sedation was employed during this procedure. A total of Versed 3.0 mg and Fentanyl 200 mcg was administered intravenously. Moderate Sedation Time: 35 minutes. The patient's level of consciousness and vital signs were monitored continuously by radiology nursing throughout the procedure under my direct supervision. FLUOROSCOPY TIME:  Fluoroscopy Time: 4 minutes 18 seconds (94 mGy). COMPLICATIONS: None immediate. PROCEDURE: Informed written consent was obtained from the patient after a thorough discussion of the procedural risks, benefits and alternatives. All questions were addressed. Maximal Sterile Barrier Technique was utilized including caps, mask, sterile gowns, sterile gloves, sterile drape, hand hygiene and skin antiseptic. A timeout was performed prior  to the initiation of the procedure. Previous imaging reviewed. In the right mid axillary line  through a lower intercostal space, ultrasound percutaneous needle access eventually performed of a peripheral right hepatic duct. There was return of blood tinged bile. Contrast injection performed for limited cholangiogram. Biliary obstruction noted with mild dilatation. 018 guidewire advanced centrally into the common bile duct followed by the Accustick dilator set. Amplatz guidewire exchange performed. Kumpe catheter advanced through the distal CBD obstruction with a Glidewire. Catheter advanced into the duodenum. Contrast injection confirms position. Amplatz guidewire inserted. Tract dilatation performed to insert a 10 [place] internal external biliary drain with the distal loop in the duodenum. Biliary system decompressed by syringe aspiration yielding 100 cc bile. Sample sent for culture. Images obtained for documentation. Catheter secured with Prolene suture and external gravity drainage bag. Sterile dressing applied. No immediate complication. Patient tolerated the procedure well. IMPRESSION: Successful right PTC with 10 [place] internal external biliary drain insertion for distal CBD obstruction Electronically Signed   By: Jerilynn Mages.  Shick M.D.   On: 07/26/2019 11:08   US Abdomen Limited Ruq  Result Date: 07/23/2019 CLINICAL DATA:  Abdominal discomfort and jaundice EXAM: ULTRASOUND ABDOMEN LIMITED RIGHT UPPER QUADRANT COMPARISON:  None. FINDINGS: Gallbladder: No gallstones or wall thickening visualized. No sonographic Murphy sign noted by sonographer. Common bile duct: Diameter: 9 mm Liver: Heterogeneous echotexture. Ill-defined 2.1 x 2.1 x 1.5 cm hypoechoic area in the right lobe and 1.7 x 1.6 x 1.5 cm hypoechoic area in the left lobe. Portal vein is patent on color Doppler imaging with normal direction of blood flow towards the liver. Other: None. IMPRESSION: Dilatation of the common bile duct. Heterogeneous appearance of the liver with two nonspecific ill-defined hypoechoic areas. No evidence of acute  cholecystitis. Electronically Signed   By: Macy Mis M.D.   On: 07/23/2019 11:23    Microbiology: Recent Results (from the past 240 hour(s))  SARS CORONAVIRUS 2 (TAT 6-24 HRS) Nasopharyngeal Nasopharyngeal Swab     Status: None   Collection Time: 07/24/19  3:35 PM   Specimen: Nasopharyngeal Swab  Result Value Ref Range Status   SARS Coronavirus 2 NEGATIVE NEGATIVE Final    Comment: (NOTE) SARS-CoV-2 target nucleic acids are NOT DETECTED. The SARS-CoV-2 RNA is generally detectable in upper and lower respiratory specimens during the acute phase of infection. Negative results do not preclude SARS-CoV-2 infection, do not rule out co-infections with other pathogens, and should not be used as the sole basis for treatment or other patient management decisions. Negative results must be combined with clinical observations, patient history, and epidemiological information. The expected result is Negative. Fact Sheet for Patients: SugarRoll.be Fact Sheet for Healthcare Providers: https://www.woods-mathews.com/ This test is not yet approved or cleared by the [place] FDA and  has been authorized for detection and/or diagnosis of SARS-CoV-2 by FDA under an Emergency Use Authorization (EUA). This EUA will remain  in effect (meaning this test can be used) for the duration of the COVID-19 declaration under Section 56 4(b)(1) of the Act, 21 U.S.C. section 360bbb-3(b)(1), unless the authorization is terminated or revoked sooner. Performed at [facility], [place] [address], [place], [place] [address]   Aerobic/Anaerobic Culture (surgical/deep wound)     Status: None (Preliminary result)   Collection Time: 07/26/19 10:43 AM   Specimen: BILE  Result Value Ref Range Status   Specimen Description BILE  Final   Special Requests Normal  Final   Gram Stain   Final    NO  WBC SEEN NO ORGANISMS SEEN Performed at [facility], [place] [address] [place], [place] [address]    Culture   Final    ABUNDANT VIRIDANS STREPTOCOCCUS NO ANAEROBES ISOLATED; CULTURE IN PROGRESS FOR 5 DAYS    Report Status PENDING  Incomplete     Labs: Basic Metabolic Panel: Recent Labs  Lab 07/24/19 0154 07/25/19 0231 07/26/19 0250 07/27/19 0527 07/29/19 0213  NA 133* 134* 130* 132* 134*  K 4.2 4.2 3.8 4.0 3.8  CL 99 101 100 99 103  CO2 24 22 22 26 22   GLUCOSE 131* 130* 172* 145* 131*  BUN 6 5* 10 13 10   CREATININE 0.68 0.79 0.76 0.79 0.69  CALCIUM 8.7* 8.5* 8.6* 8.5* 8.2*  MG  --   --   --   --  2.1   Liver Function Tests: Recent Labs  Lab 07/24/19 0154 07/25/19 0231 07/26/19 0250 07/27/19 0527 07/29/19 0213  AST 95* 82* 97* 78* 51*  ALT 139* 130* 131* 128* 84*  ALKPHOS 242* 251* 270* 246* 169*  BILITOT 11.6* 11.7* 11.3* 6.9* 6.0*  PROT 6.5 6.3* 6.5 6.5 6.1*  ALBUMIN 3.3* 3.2* 3.1* 3.1* 2.7*   Recent Labs  Lab 07/23/19 1142 07/25/19 0231 07/26/19 0250 07/27/19 0527 07/28/19 0830  LIPASE 297* 781* 581* 2,085* 1,074*   No results for input(s): AMMONIA in the last 168 hours. CBC: Recent Labs  Lab 07/23/19 1142 07/24/19 0154 07/27/19 0527 07/29/19 0213  WBC 4.6 4.9 8.0 5.6  NEUTROABS 3.0  --   --   --   HGB 13.7 12.4* 12.6* 11.6*  HCT 39.0 34.9* 35.7* 32.6*  MCV 97.3 96.4 97.5 96.7  PLT 228 197 241 227   Cardiac Enzymes: No results for input(s): CKTOTAL, CKMB, CKMBINDEX, TROPONINI in the last 168 hours. BNP: BNP (last 3 results) No results for input(s): BNP in the last 8760 hours.  ProBNP (last 3 results) No results for input(s): PROBNP in the last 8760 hours.  CBG: No results for input(s): GLUCAP in the last 168 hours.     Signed:  Cristal Ford  [facility] 07/29/2019, 4:53 PM

## 2019-07-29 NOTE — Progress Notes (Signed)
AVS given and reviewed with pt. Medications discussed. All questions answered to satisfaction. Pt and wife verbalized understanding of information given and how to flush and change dressing for drain. Supplies provided for dressing changes. Pt to be escorted off the unit with all belongings via wheelchair by staff member.

## 2019-07-29 NOTE — Progress Notes (Signed)
Referring Physician(s): Dr. Loletha Carrow  Supervising Physician: Dr. Kathlene Cote  Patient Status:  [facility] - In-pt  Chief Complaint: Obstructive jaundice   Subjective: 63 y.o. male. History jaundice found to have a pancreatic mass s/p ERCP biopsy on 11.21.20 (cytology pending). IR placed a biliary drain on 11.22.20.    Allergies: Patient has no known allergies.  Medications:  Current Facility-Administered Medications:    0.9 %  sodium chloride infusion, , Intravenous, Continuous, Ronnette Juniper, MD, Last Rate: 125 mL/hr at 07/29/19 0547   albuterol (PROVENTIL) (2.5 MG/3ML) 0.083% nebulizer solution 2.5 mg, 2.5 mg, Nebulization, Q6H PRN, Ronnette Juniper, MD   allopurinol (ZYLOPRIM) tablet 300 mg, 300 mg, Oral, Daily, Ronnette Juniper, MD, 300 mg at 07/28/19 1033   alum & mag hydroxide-simeth (MAALOX/MYLANTA) 200-200-20 MG/5ML suspension 30 mL, 30 mL, Oral, Q4H PRN, Ronnette Juniper, MD, 30 mL at 07/23/19 2303   morphine 2 MG/ML injection 2-4 mg, 2-4 mg, Intravenous, Q4H PRN, Arlan Organ, DO, 2 mg at 07/26/19 1850   ondansetron (ZOFRAN) tablet 4 mg, 4 mg, Oral, Q6H PRN **OR** ondansetron (ZOFRAN) injection 4 mg, 4 mg, Intravenous, Q6H PRN, Ronnette Juniper, MD   oxyCODONE (Oxy IR/ROXICODONE) immediate release tablet 10 mg, 10 mg, Oral, Q6H PRN, Arlan Organ, DO, 10 mg at 07/28/19 2107   pantoprazole (PROTONIX) EC tablet 40 mg, 40 mg, Oral, BID, Arlan Organ, DO, 40 mg at 07/28/19 2106   polyethylene glycol (MIRALAX / GLYCOLAX) packet 17 g, 17 g, Oral, Daily, Lorella Nimrod, MD, 17 g at 07/27/19 1526   polyethylene glycol (MIRALAX / GLYCOLAX) packet 17 g, 17 g, Oral, Daily PRN, Amin, Ankit Chirag, MD   senna-docusate (Senokot-S) tablet 2 tablet, 2 tablet, Oral, QHS PRN, Amin, Ankit Chirag, MD   sodium chloride flush (NS) 0.9 % injection 3 mL, 3 mL, Intravenous, Q12H, Ronnette Juniper, MD, 3 mL at 07/27/19 0932   sodium chloride flush (NS) 0.9 % injection 5 mL, 5 mL, Intracatheter, Q8H, Greggory Keen, MD,  5 mL at 07/29/19 0548   zolpidem (AMBIEN) tablet 5 mg, 5 mg, Oral, QHS PRN, Ronnette Juniper, MD, 5 mg at 07/28/19 2106    Vital Signs: BP 116/78 (BP Location: Left Arm)    Pulse 73    Temp 98.6 F (37 C) (Oral)    Resp 15    Ht 5' 11"  (1.803 m)    Wt 107 kg    SpO2 97%    BMI 32.92 kg/m   Physical Exam Vitals signs and nursing note reviewed.  Constitutional:      Appearance: He is well-developed.  HENT:     Head: Normocephalic.  Neck:     Musculoskeletal: Normal range of motion.  Pulmonary:     Effort: Pulmonary effort is normal.  Musculoskeletal: Normal range of motion.  Skin:    General: Skin is warm and dry.     Comments: drain site is unremarkable with no erythema, tenderness or drainage noted. Suture and stat lock in place. Dressing is clean dry and intact. Bilious output   Neurological:     Mental Status: He is alert and oriented to person, place, and time.     Imaging: Ir Int Lianne Cure Biliary Drain With Cholangiogram  Result Date: 07/26/2019 INDICATION: Obstructive jaundice, elevated LFTs, and ampullary/pancreatic head mass, failed ERCP EXAM: ULTRASOUND AND FLUOROSCOPIC RIGHT PTC WITH INTERNAL EXTERNAL BILIARY DRAIN MEDICATIONS: 3.375 g Zosyn; The antibiotic was administered within an appropriate time frame prior to the initiation of the procedure. ANESTHESIA/SEDATION: Moderate (  conscious) sedation was employed during this procedure. A total of Versed 3.0 mg and Fentanyl 200 mcg was administered intravenously. Moderate Sedation Time: 35 minutes. The patient's level of consciousness and vital signs were monitored continuously by radiology nursing throughout the procedure under my direct supervision. FLUOROSCOPY TIME:  Fluoroscopy Time: 4 minutes 18 seconds (94 mGy). COMPLICATIONS: None immediate. PROCEDURE: Informed written consent was obtained from the patient after a thorough discussion of the procedural risks, benefits and alternatives. All questions were addressed. Maximal Sterile  Barrier Technique was utilized including caps, mask, sterile gowns, sterile gloves, sterile drape, hand hygiene and skin antiseptic. A timeout was performed prior to the initiation of the procedure. Previous imaging reviewed. In the right mid axillary line through a lower intercostal space, ultrasound percutaneous needle access eventually performed of a peripheral right hepatic duct. There was return of blood tinged bile. Contrast injection performed for limited cholangiogram. Biliary obstruction noted with mild dilatation. 018 guidewire advanced centrally into the common bile duct followed by the Accustick dilator set. Amplatz guidewire exchange performed. Kumpe catheter advanced through the distal CBD obstruction with a Glidewire. Catheter advanced into the duodenum. Contrast injection confirms position. Amplatz guidewire inserted. Tract dilatation performed to insert a 10 [place] internal external biliary drain with the distal loop in the duodenum. Biliary system decompressed by syringe aspiration yielding 100 cc bile. Sample sent for culture. Images obtained for documentation. Catheter secured with Prolene suture and external gravity drainage bag. Sterile dressing applied. No immediate complication. Patient tolerated the procedure well. IMPRESSION: Successful right PTC with 10 [place] internal external biliary drain insertion for distal CBD obstruction Electronically Signed   By: Jerilynn Mages.  Shick M.D.   On: 07/26/2019 11:08    Labs:  CBC: Recent Labs    07/23/19 1142 07/24/19 0154 07/27/19 0527 07/29/19 0213  WBC 4.6 4.9 8.0 5.6  HGB 13.7 12.4* 12.6* 11.6*  HCT 39.0 34.9* 35.7* 32.6*  PLT 228 197 241 227    COAGS: Recent Labs    07/23/19 1100 07/25/19 0231 07/26/19 0250  INR 1.1 1.2 1.2    BMP: Recent Labs    07/25/19 0231 07/26/19 0250 07/27/19 0527 07/29/19 0213  NA 134* 130* 132* 134*  K 4.2 3.8 4.0 3.8  CL 101 100 99 103  CO2 22 22 26 22   GLUCOSE 130* 172* 145* 131*  BUN 5* 10  13 10   CALCIUM 8.5* 8.6* 8.5* 8.2*  CREATININE 0.79 0.76 0.79 0.69  GFRNONAA >60 >60 >60 >60  GFRAA >60 >60 >60 >60    LIVER FUNCTION TESTS: Recent Labs    07/25/19 0231 07/26/19 0250 07/27/19 0527 07/29/19 0213  BILITOT 11.7* 11.3* 6.9* 6.0*  AST 82* 97* 78* 51*  ALT 130* 131* 128* 84*  ALKPHOS 251* 270* 246* 169*  PROT 6.3* 6.5 6.5 6.1*  ALBUMIN 3.2* 3.1* 3.1* 2.7*    Assessment and Plan: 63 y.o. male. History jaundice found to have a pancreatic mass s/p ERCP biopsy on 11.21.20 (cytology pending).  IR placed a biliary drain on 11.22.20. Total bilirubin is 6.0 trending down  Recommend team continue with flushing TID, output recording q shift and dressing changes as needed. Patient to be scheduled for cholangiogram and possible capping as outpatient. Order placed to facilitate.   Plan for [facility] placement later today  Electronically Signed: Ascencion Dike, PA-C 07/29/2019, 9:08 AM   I spent a total of 15 Minutes at the the patient's bedside AND on the patient's hospital floor or unit, greater than 50% of which  was counseling/coordinating care for bilary drain.

## 2019-07-29 NOTE — Progress Notes (Signed)
Hopefully, Mr. Mazzaferro will be going home today after he has the Port-A-Cath placed.  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  I will have to see if we can send this off for molecular studies.  Hopefully we will be able to.  His lipase is coming down.  His bilirubin is still 6.  I would like to try to get him treated with FOLFOXIRI.  I think this would be appropriate given his age and his performance status.  I will try to get this set up for him in the office.  I think the "monkey wrench" might be his bilirubin.  We need to see if we can get this lower.  Hopefully the external drain will work.  Hopefully with treatment, the tumor will shrink so that an internal stent can be placed.  He is eating well.  He is having no problems with nausea or vomiting.  He does have some discomfort at the site of the drainage catheter.  He has had no fever.  There is been no bleeding.  I will plan to get him back to see [place] in the office after Thanksgiving.  We will get him back the week afterwards and hopefully we get started with treatment.  We will take a couple weeks before getting molecular studies back.  I think it would be highly on likely that we would be able to use some kind of targeted therapy or even immunotherapy for this.  Typically, pancreatic cancer has been relatively "cold" with respect to actionable mutations.  Lattie Haw, MD  Psalm 100:4

## 2019-07-29 NOTE — Progress Notes (Signed)
[facility] sent on duodenal biopsy per order of Dr. Marin Olp.

## 2019-07-29 NOTE — Progress Notes (Signed)
START ON PATHWAY REGIMEN - Pancreatic Adenocarcinoma     A cycle is every 14 days:     Oxaliplatin      Leucovorin      Irinotecan      Fluorouracil   **Always confirm dose/schedule in your pharmacy ordering system**  Patient Characteristics: Metastatic Disease, First Line, PS = 0,1, BRCA1/2 and PALB2  Mutation Absent/Unknown Current evidence of distant metastases<= Yes AJCC T Category: T2 AJCC N Category: N1 AJCC M Category: M1 AJCC 8 Stage Grouping: IV Line of Therapy: First Line ECOG Performance Status: 0 BRCA1/2 Mutation Status: Awaiting Test Results PALB2 Mutation Status: Awaiting Test Results Intent of Therapy: Non-Curative / Palliative Intent, Discussed with Patient

## 2019-07-30 ENCOUNTER — Emergency Department (HOSPITAL_COMMUNITY)
Admission: EM | Admit: 2019-07-30 | Discharge: 2019-07-31 | Disposition: A | Discharge status: Home / Self Care | Payer: BC Managed Care – PPO | Attending: Emergency Medicine | Admitting: Emergency Medicine

## 2019-07-30 ENCOUNTER — Emergency Department (HOSPITAL_COMMUNITY): Payer: BC Managed Care – PPO

## 2019-07-30 ENCOUNTER — Other Ambulatory Visit: Payer: Self-pay

## 2019-07-30 ENCOUNTER — Encounter (HOSPITAL_COMMUNITY): Payer: Self-pay | Admitting: Emergency Medicine

## 2019-07-30 DIAGNOSIS — R7989 Other specified abnormal findings of blood chemistry: Secondary | ICD-10-CM

## 2019-07-30 DIAGNOSIS — R109 Unspecified abdominal pain: Secondary | ICD-10-CM | POA: Diagnosis not present

## 2019-07-30 DIAGNOSIS — C259 Malignant neoplasm of pancreas, unspecified: Secondary | ICD-10-CM | POA: Diagnosis not present

## 2019-07-30 DIAGNOSIS — C787 Secondary malignant neoplasm of liver and intrahepatic bile duct: Secondary | ICD-10-CM | POA: Diagnosis not present

## 2019-07-30 DIAGNOSIS — Z79899 Other long term (current) drug therapy: Secondary | ICD-10-CM | POA: Insufficient documentation

## 2019-07-30 DIAGNOSIS — R945 Abnormal results of liver function studies: Secondary | ICD-10-CM | POA: Diagnosis not present

## 2019-07-30 DIAGNOSIS — R1011 Right upper quadrant pain: Secondary | ICD-10-CM | POA: Insufficient documentation

## 2019-07-30 LAB — CBC
HCT: 38.3 % — ABNORMAL LOW (ref 39.0–52.0)
Hemoglobin: 13.4 g/dL (ref 13.0–17.0)
MCH: 34.4 pg — ABNORMAL HIGH (ref 26.0–34.0)
MCHC: 35 g/dL (ref 30.0–36.0)
MCV: 98.5 fL (ref 80.0–100.0)
Platelets: 352 10*3/uL (ref 150–400)
RBC: 3.89 MIL/uL — ABNORMAL LOW (ref 4.22–5.81)
RDW: 12.2 % (ref 11.5–15.5)
WBC: 7.3 10*3/uL (ref 4.0–10.5)
nRBC: 0 % (ref 0.0–0.2)

## 2019-07-30 LAB — COMPREHENSIVE METABOLIC PANEL
ALT: 95 U/L — ABNORMAL HIGH (ref 0–44)
AST: 70 U/L — ABNORMAL HIGH (ref 15–41)
Albumin: 3.3 g/dL — ABNORMAL LOW (ref 3.5–5.0)
Alkaline Phosphatase: 197 U/L — ABNORMAL HIGH (ref 38–126)
Anion gap: 10 (ref 5–15)
BUN: 10 mg/dL (ref 6–20)
CO2: 23 mmol/L (ref 22–32)
Calcium: 8.8 mg/dL — ABNORMAL LOW (ref 8.9–10.3)
Chloride: 96 mmol/L — ABNORMAL LOW (ref 98–111)
Creatinine, Ser: 0.99 mg/dL (ref 0.61–1.24)
GFR calc Af Amer: >60 mL/min (ref >60)
GFR calc non Af Amer: >60 mL/min (ref >60)
Glucose, Bld: 211 mg/dL — ABNORMAL HIGH (ref 70–99)
Potassium: 3.4 mmol/L — ABNORMAL LOW (ref 3.5–5.1)
Sodium: 129 mmol/L — ABNORMAL LOW (ref 135–145)
Total Bilirubin: 6.4 mg/dL — ABNORMAL HIGH (ref 0.3–1.2)
Total Protein: 7.4 g/dL (ref 6.5–8.1)

## 2019-07-30 LAB — LIPASE, BLOOD: Lipase: 1631 U/L — ABNORMAL HIGH (ref 11–51)

## 2019-07-30 MED ORDER — MORPHINE SULFATE (PF) 4 MG/ML IV SOLN
4.0000 mg | Freq: Once | INTRAVENOUS | Status: DC
  Filled 2019-07-30: qty 1

## 2019-07-30 MED ORDER — IOHEXOL 300 MG/ML  SOLN
100.0000 mL | Freq: Once | INTRAMUSCULAR | Status: AC | PRN
  Administered 2019-07-30: 100 mL via INTRAVENOUS

## 2019-07-30 NOTE — ED Provider Notes (Signed)
[place] EMERGENCY DEPARTMENT Provider Note   CSN: [id number] Arrival date & time: 07/30/19  1942     History   Chief Complaint Chief Complaint  Patient presents with   Abdominal Pain    Pancreatic Cancer mets to Liver    HPI Jeffrey Harris is a 63 y.o. male.     63 y.o male with a PMH of Gout, Pancreatic cancer metastasized to the liver diagnosed 1 week ago, resents to the ED with a chief complaint of right upper quadrant pain with radiation to the right shoulder..  Patient reports he was sitting after eating given dinner when he suddenly felt a sharp pain to the right upper quadrant, reports this pain was constant, was significant enough to cause him to be short of breath.  He reports deep inspiration made this pain worse.  He reports taking 2 oxycodone's, during the drive to the hospital he reports he passed gas.  He arrived in the ED and reports his pain is now a 3 out of 10.  The history is provided by the patient.  Abdominal Pain Associated symptoms: no chest pain, no fever, no nausea, no shortness of breath, no sore throat and no vomiting     Past Medical History:  Diagnosis Date   Goals of care, counseling/discussion 07/29/2019   Gout    Hyperbilirubinemia 07/2019   Pancreatic cancer metastasized to liver ([place]) 07/29/2019    Patient Active Problem List   Diagnosis Date Noted   Goals of care, counseling/discussion 07/29/2019   Pancreatic cancer metastasized to liver ([place]) 07/29/2019   Hyponatremia    Elevated LFTs    Jaundice    Obstructive jaundice    Hyperbilirubinemia 07/23/2019   Elevated liver enzymes 07/23/2019   History of gout 07/23/2019   Obesity (BMI 30-39.9) 07/23/2019    Past Surgical History:  Procedure Laterality Date   BIOPSY  07/25/2019   Procedure: BIOPSY;  Surgeon: Ronnette Juniper, MD;  Location: [place];  Service: Gastroenterology;;   ESOPHAGOGASTRODUODENOSCOPY  07/25/2019   Procedure:  ESOPHAGOGASTRODUODENOSCOPY (EGD);  Surgeon: Ronnette Juniper, MD;  Location: [place];  Service: Gastroenterology;;   IR INT EXT BILIARY DRAIN WITH CHOLANGIOGRAM  07/26/2019   KNEE ARTHROSCOPY          Home Medications    Prior to Admission medications   Medication Sig Start Date End Date Taking? Authorizing Provider  allopurinol (ZYLOPRIM) 300 MG tablet Take 300 mg by mouth daily.   Yes [provider]  Aspirin-Acetaminophen-Caffeine (EXCEDRIN MIGRAINE PO) Take 1 tablet by mouth daily as needed (migraine).   Yes [provider]  MELATONIN PO Take 1 tablet by mouth daily.   Yes [provider]  Multiple Vitamins-Minerals (CENTRUM ADULTS PO) Take 1 tablet by mouth daily.   Yes [provider]  oxyCODONE (OXY IR/ROXICODONE) 5 MG immediate release tablet Take 2 tablets (10 mg total) by mouth every 6 (six) hours as needed for up to 5 days for moderate pain or severe pain. 07/29/19 08/03/19 Yes Mikhail, Maryann, DO  pantoprazole (PROTONIX) 40 MG tablet Take 1 tablet (40 mg total) by mouth 2 (two) times daily. 07/29/19  Yes Mikhail, Velta Addison, DO  zolpidem (AMBIEN) 5 MG tablet Take 1 tablet (5 mg total) by mouth at bedtime as needed for sleep. Patient not taking: Reported on 07/30/2019 07/29/19   Cristal Ford, DO    Family History Family History  Problem Relation Age of Onset   Diabetes Father     Social History  Social History   Tobacco Use   Smoking status: Never Smoker   Smokeless tobacco: Former Systems developer    Types: Snuff  Substance Use Topics   Alcohol use: No   Drug use: No     Allergies   Patient has no known allergies.   Review of Systems Review of Systems  Constitutional: Negative for fever.  HENT: Negative for sore throat.   Respiratory: Negative for shortness of breath.   Cardiovascular: Negative for chest pain.  Gastrointestinal: Positive for abdominal pain. Negative for blood in stool, nausea and vomiting.  Genitourinary:  Negative for flank pain.  Musculoskeletal: Negative for back pain.  Skin: Negative for pallor and wound.  Neurological: Negative for light-headedness and headaches.     Physical Exam Updated Vital Signs BP 130/78    Pulse 78    Temp 98.4 F (36.9 C) (Oral)    Resp (!) 23    SpO2 97%   Physical Exam Vitals signs and nursing note reviewed.  Constitutional:      Appearance: He is well-developed.  HENT:     Head: Normocephalic and atraumatic.  Eyes:     General: Scleral icterus present.     Pupils: Pupils are equal, round, and reactive to light.  Neck:     Musculoskeletal: Normal range of motion.  Cardiovascular:     Heart sounds: Normal heart sounds.  Pulmonary:     Effort: Pulmonary effort is normal.     Breath sounds: Normal breath sounds. No wheezing.  Chest:     Chest wall: No tenderness.  Abdominal:     General: Bowel sounds are normal. There is no distension.     Palpations: Abdomen is soft.     Tenderness: There is abdominal tenderness in the right upper quadrant.       Comments: Mild tenderness with palpation.  Musculoskeletal:        General: No tenderness or deformity.  Skin:    General: Skin is warm and dry.     Coloration: Skin is jaundiced.  Neurological:     Mental Status: He is alert and oriented to person, place, and time.      ED Treatments / Results  Labs (all labs ordered are listed, but only abnormal results are displayed) Labs Reviewed  LIPASE, BLOOD - Abnormal; Notable for the following components:      Result Value   Lipase 1,631 (*)    All other components within normal limits  COMPREHENSIVE METABOLIC PANEL - Abnormal; Notable for the following components:   Sodium 129 (*)    Potassium 3.4 (*)    Chloride 96 (*)    Glucose, Bld 211 (*)    Calcium 8.8 (*)    Albumin 3.3 (*)    AST 70 (*)    ALT 95 (*)    Alkaline Phosphatase 197 (*)    Total Bilirubin 6.4 (*)    All other components within normal limits  CBC - Abnormal; Notable  for the following components:   RBC 3.89 (*)    HCT 38.3 (*)    MCH 34.4 (*)    All other components within normal limits  URINALYSIS, ROUTINE W REFLEX MICROSCOPIC    EKG None  Radiology Ct Abdomen Pelvis W Contrast  Result Date: 07/30/2019 CLINICAL DATA:  Generalized abdominal pain, history of pancreatic carcinoma with metastatic liver disease and biliary drainage catheter EXAM: CT ABDOMEN AND PELVIS WITH CONTRAST TECHNIQUE: Multidetector CT imaging of the abdomen and pelvis was performed using the  standard protocol following bolus administration of intravenous contrast. CONTRAST:  156mL OMNIPAQUE IOHEXOL 300 MG/ML  SOLN COMPARISON:  07/23/2019 FINDINGS: Lower chest: Bibasilar atelectatic changes are noted. These are new from the prior exam. Hepatobiliary: Liver again demonstrates multiple hypodense lesions consistent with metastatic disease. A biliary drainage catheter is identified entering the right lobe and extending into the duodenum stable in appearance from prior examination from 07/26/2019. Gallbladder demonstrates air within consistent with the biliary drainage tube. No definitive cholelithiasis is seen. The gallbladder is predominately decompressed. Pancreas: Pancreas is well visualized. The known pancreatic head mass is stable in appearance. The remainder of the pancreas is unremarkable. Spleen: Normal in size without focal abnormality. Adrenals/Urinary Tract: Right adrenal gland is within normal limits. Left adrenal lesions are again noted and stable. 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 The kidneys show no evidence of renal calculi or urinary tract obstructive changes. Right kidney is somewhat ptotic within the pelvis. Malrotation defect is noted without obstructive change. Bladder is well distended. Stomach/Bowel: The appendix is within normal limits. Colon shows no obstructive or inflammatory changes. Small bowel and stomach are unremarkable. Vascular/Lymphatic: Aortic  atherosclerotic changes noted. Small peripancreatic lymph nodes are again seen and stable. Small lymph node is noted adjacent to the GE junction. These are stable from the recent exam. Reproductive: Prostate is unremarkable. Other: No abdominal wall hernia or abnormality. No abdominopelvic ascites. Musculoskeletal: Degenerative changes of lumbar spine are noted. IMPRESSION: Changes consistent with the known pancreatic mass with liver metastatic disease and lymphadenopathy are noted. New biliary drainage catheter is noted in satisfactory position. No perihepatic fluid collection is seen. New bibasilar atelectatic changes. No other focal abnormality is noted. Electronically Signed   By: Inez Catalina M.D.   On: 07/30/2019 23:39    Procedures Procedures (including critical care time)  Medications Ordered in ED Medications  morphine 4 MG/ML injection 6 mg (0 mg Intramuscular Hold 07/31/19 0024)  iohexol (OMNIPAQUE) 300 MG/ML solution 100 mL (100 mLs Intravenous Contrast Given 07/30/19 2316)  HYDROmorphone (DILAUDID) injection 1 mg (1 mg Intramuscular Given 07/31/19 0016)     Initial Impression / Assessment and Plan / ED Course  I have reviewed the triage vital signs and the nursing notes.  Pertinent labs & imaging results that were available during my care of the patient were reviewed by me and considered in my medical decision making (see chart for details).       Patient with a new diagnosis of pancreatic primary neoplasm with metastasis to the liver presents to the ED with right upper quadrant pain radiating onto the right shoulder.  Reports his pain was severe in nature, caused him to be short of breath.  He reports his pain was improved after he passed some gas while driving into the ED.  He has taken 2 hydrocodone and states his pain has now become a 3 out of 10.  During evaluation patient is well-appearing, he is in no distress, vitals are within normal limits, he is afebrile.  Reports  tenderness along the right upper quadrant with radiation to his right shoulder.  CBC without any leukocytosis.  CMP remarkable for his elevated LFTs, these have been persistently elevated after his diagnosis with pancreatic neoplasm.  Bilirubin is slightly increased when compared to labs from the day before.  Lipase level is 1,631.  Patient does have a biliary drain in place, this has not had any changes with output.  Denies any fever at home or nausea or vomiting.  Patient remains jaundice.  Due to patient's new onset of pancreatic neoplasm will obtain CT to further evaluate any further obstructions as labs have slightly changed from yesterday.  CT of his abdomen showed: Changes consistent with the known pancreatic mass with liver  metastatic disease and lymphadenopathy are noted.    New biliary drainage catheter is noted in satisfactory position. No  perihepatic fluid collection is seen.    New bibasilar atelectatic changes. No other focal abnormality is  noted.     I have discussed the results of the CT imaging with patient at length, he reports his pain has returned at this time, will try an IM shot of Dilaudid as unfortunately his IV had infiltrated after attempting to receive some morphine.  12:24 AM Patient was reassessed after receiving IM Dilaudid, he will follow-up with oncology tomorrow.  I will provide him with a copy of his CT scan.  Portions of this note were generated with [employer]obby[employer]. Dictation errors may occur despite best attempts at proofreading.  Final Clinical Impressions(s) / ED Diagnoses   Final diagnoses:  Right upper quadrant abdominal pain  Elevated LFTs    ED Discharge Orders    None       Janeece Fitting, Hershal Coria 07/31/19 Boone Master, MD 07/31/19 [phone]

## 2019-07-30 NOTE — ED Triage Notes (Signed)
Patient reports RUQ pain this evening , no emesis or diarrhea , denies fever or chills , diagnosed with pancreatic cancer with metastasis to liver , he has a biliary tube drain .

## 2019-07-30 NOTE — ED Notes (Signed)
Pt used a urinal but unable to void this time.

## 2019-07-31 LAB — AEROBIC/ANAEROBIC CULTURE W GRAM STAIN (SURGICAL/DEEP WOUND)
Gram Stain: NONE SEEN
Special Requests: NORMAL

## 2019-07-31 MED ORDER — HYDROMORPHONE HCL 1 MG/ML IJ SOLN
1.0000 mg | Freq: Once | INTRAMUSCULAR | Status: AC
  Administered 2019-07-31: 1 mg via INTRAMUSCULAR
  Filled 2019-07-31: qty 1

## 2019-07-31 MED ORDER — MORPHINE SULFATE (PF) 4 MG/ML IV SOLN
6.0000 mg | Freq: Once | INTRAVENOUS | Status: DC
  Filled 2019-07-31: qty 2

## 2019-07-31 NOTE — Discharge Instructions (Addendum)
Your CT today was normal.  You received a medication in the emergency department, if you need more medication to control your pain please call your oncologist tomorrow morning.  If you experience any vomiting, worsening symptoms you may return to the emergency department.

## 2019-07-31 NOTE — ED Notes (Signed)
Patient verbalizes understanding of discharge instructions. Opportunity for questioning and answers were provided. Armband removed by staff, pt discharged from ED ambulatory to home.  

## 2019-08-03 ENCOUNTER — Telehealth: Payer: Self-pay | Admitting: *Deleted

## 2019-08-03 ENCOUNTER — Other Ambulatory Visit: Payer: Self-pay | Admitting: Radiology

## 2019-08-03 ENCOUNTER — Other Ambulatory Visit: Payer: Self-pay | Admitting: Hematology & Oncology

## 2019-08-03 ENCOUNTER — Encounter: Payer: Self-pay | Admitting: *Deleted

## 2019-08-03 DIAGNOSIS — C787 Secondary malignant neoplasm of liver and intrahepatic bile duct: Secondary | ICD-10-CM

## 2019-08-03 DIAGNOSIS — K831 Obstruction of bile duct: Secondary | ICD-10-CM

## 2019-08-03 DIAGNOSIS — C259 Malignant neoplasm of pancreas, unspecified: Secondary | ICD-10-CM

## 2019-08-03 MED ORDER — LOPERAMIDE HCL 2 MG PO TABS: ORAL_TABLET | ORAL | 1 refills | Status: DC

## 2019-08-03 MED ORDER — LIDOCAINE-PRILOCAINE 2.5-2.5 % EX CREA: TOPICAL_CREAM | CUTANEOUS | 3 refills | Status: DC

## 2019-08-03 MED ORDER — PROCHLORPERAZINE MALEATE 10 MG PO TABS: 10.0000 mg | ORAL_TABLET | Freq: Four times a day (QID) | ORAL | 1 refills | Status: DC | PRN

## 2019-08-03 MED ORDER — LORAZEPAM 0.5 MG PO TABS: 0.5000 mg | ORAL_TABLET | Freq: Four times a day (QID) | ORAL | 0 refills | Status: DC | PRN

## 2019-08-03 MED ORDER — ONDANSETRON HCL 8 MG PO TABS: 8.0000 mg | ORAL_TABLET | Freq: Two times a day (BID) | ORAL | 1 refills | Status: DC | PRN

## 2019-08-03 MED ORDER — DEXAMETHASONE 4 MG PO TABS: 8.0000 mg | ORAL_TABLET | Freq: Every day | ORAL | 5 refills | Status: DC

## 2019-08-03 NOTE — Telephone Encounter (Signed)
Received a call from [place] patients husband to let us know patient has not had his port placed yet and is down for chemo tomorrow.  Called IR and spoke with Dr. Marin Olp.  Patient to get port on Thursday 08/06/2019 and will put patient down for chemo on Monday. 12/70/2020.  Call transferred to [facility] our scheduler to make these changes.  Beverlee Nims aware of above changes.

## 2019-08-04 ENCOUNTER — Inpatient Hospital Stay: Payer: BC Managed Care – PPO

## 2019-08-04 ENCOUNTER — Encounter: Payer: Self-pay | Admitting: *Deleted

## 2019-08-04 ENCOUNTER — Encounter: Payer: Self-pay | Admitting: Hematology & Oncology

## 2019-08-04 ENCOUNTER — Inpatient Hospital Stay: Payer: BC Managed Care – PPO | Attending: Hematology & Oncology | Admitting: Hematology & Oncology

## 2019-08-04 ENCOUNTER — Telehealth: Payer: Self-pay | Admitting: *Deleted

## 2019-08-04 ENCOUNTER — Other Ambulatory Visit: Payer: Self-pay

## 2019-08-04 ENCOUNTER — Other Ambulatory Visit: Payer: Self-pay | Admitting: *Deleted

## 2019-08-04 ENCOUNTER — Ambulatory Visit: Payer: BC Managed Care – PPO

## 2019-08-04 VITALS — BP 120/87 | HR 111 | Temp 98.7°F | Resp 18 | Wt 225.0 lb

## 2019-08-04 DIAGNOSIS — C787 Secondary malignant neoplasm of liver and intrahepatic bile duct: Secondary | ICD-10-CM | POA: Diagnosis not present

## 2019-08-04 DIAGNOSIS — C259 Malignant neoplasm of pancreas, unspecified: Secondary | ICD-10-CM | POA: Diagnosis not present

## 2019-08-04 DIAGNOSIS — Z5111 Encounter for antineoplastic chemotherapy: Secondary | ICD-10-CM | POA: Diagnosis not present

## 2019-08-04 DIAGNOSIS — C250 Malignant neoplasm of head of pancreas: Secondary | ICD-10-CM | POA: Diagnosis not present

## 2019-08-04 DIAGNOSIS — Z79899 Other long term (current) drug therapy: Secondary | ICD-10-CM | POA: Insufficient documentation

## 2019-08-04 DIAGNOSIS — R17 Unspecified jaundice: Secondary | ICD-10-CM | POA: Diagnosis not present

## 2019-08-04 DIAGNOSIS — G893 Neoplasm related pain (acute) (chronic): Secondary | ICD-10-CM | POA: Diagnosis not present

## 2019-08-04 DIAGNOSIS — R978 Other abnormal tumor markers: Secondary | ICD-10-CM | POA: Diagnosis not present

## 2019-08-04 DIAGNOSIS — Z938 Other artificial opening status: Secondary | ICD-10-CM | POA: Insufficient documentation

## 2019-08-04 DIAGNOSIS — R197 Diarrhea, unspecified: Secondary | ICD-10-CM | POA: Insufficient documentation

## 2019-08-04 LAB — CMP (CANCER CENTER ONLY)
ALT: 79 U/L — ABNORMAL HIGH (ref 0–44)
AST: 64 U/L — ABNORMAL HIGH (ref 15–41)
Albumin: 4.3 g/dL (ref 3.5–5.0)
Alkaline Phosphatase: 205 U/L — ABNORMAL HIGH (ref 38–126)
Anion gap: 13 (ref 5–15)
BUN: 17 mg/dL (ref 6–20)
CO2: 25 mmol/L (ref 22–32)
Calcium: 9.8 mg/dL (ref 8.9–10.3)
Chloride: 90 mmol/L — ABNORMAL LOW (ref 98–111)
Creatinine: 0.96 mg/dL (ref 0.61–1.24)
GFR, Est AFR Am: >60 mL/min (ref >60)
GFR, Estimated: >60 mL/min (ref >60)
Glucose, Bld: 213 mg/dL — ABNORMAL HIGH (ref 70–99)
Potassium: 4.3 mmol/L (ref 3.5–5.1)
Sodium: 128 mmol/L — ABNORMAL LOW (ref 135–145)
Total Bilirubin: 7.8 mg/dL (ref 0.3–1.2)
Total Protein: 8.3 g/dL — ABNORMAL HIGH (ref 6.5–8.1)

## 2019-08-04 LAB — CBC WITH DIFFERENTIAL (CANCER CENTER ONLY)
Abs Immature Granulocytes: 0.05 10*3/uL (ref 0.00–0.07)
Basophils Absolute: 0.1 10*3/uL (ref 0.0–0.1)
Basophils Relative: 1 %
Eosinophils Absolute: 0.2 10*3/uL (ref 0.0–0.5)
Eosinophils Relative: 2 %
HCT: 37.8 % — ABNORMAL LOW (ref 39.0–52.0)
Hemoglobin: 13.7 g/dL (ref 13.0–17.0)
Immature Granulocytes: 1 %
Lymphocytes Relative: 11 %
Lymphs Abs: 1.2 10*3/uL (ref 0.7–4.0)
MCH: 34.3 pg — ABNORMAL HIGH (ref 26.0–34.0)
MCHC: 36.2 g/dL — ABNORMAL HIGH (ref 30.0–36.0)
MCV: 94.5 fL (ref 80.0–100.0)
Monocytes Absolute: 0.8 10*3/uL (ref 0.1–1.0)
Monocytes Relative: 8 %
Neutro Abs: 8.2 10*3/uL — ABNORMAL HIGH (ref 1.7–7.7)
Neutrophils Relative %: 77 %
Platelet Count: 418 10*3/uL — ABNORMAL HIGH (ref 150–400)
RBC: 4 MIL/uL — ABNORMAL LOW (ref 4.22–5.81)
RDW: 11.3 % — ABNORMAL LOW (ref 11.5–15.5)
WBC Count: 10.6 10*3/uL — ABNORMAL HIGH (ref 4.0–10.5)
nRBC: 0 % (ref 0.0–0.2)

## 2019-08-04 MED ORDER — PANCRELIPASE (LIP-PROT-AMYL) 36000-114000 UNITS PO CPEP: 72000.0000 [IU] | ORAL_CAPSULE | Freq: Three times a day (TID) | ORAL | 6 refills | Status: DC

## 2019-08-04 MED ORDER — LACTULOSE 20 GM/30ML PO SOLN: 20.0000 g | Freq: Two times a day (BID) | ORAL | 6 refills | Status: DC

## 2019-08-04 NOTE — Progress Notes (Signed)
Hematology and Oncology Follow Up Visit  Jeffrey Harris [id number] 1956/04/11 63 y.o. 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  I saw in consultation at [facility] couple weeks ago.  At that time, he presented with painless jaundice.  He had lost some weight.  He was found to have a mass in the head of the pancreas along with liver metastasis.  His CA 19-9 was 10,800.  He did undergo a biopsy.  He underwent an ERCP.  Unfortunately they could not stent the biliary duct.  He required a percutaneous transhepatic stent.  As such, he is draining into a bag outside of his body.  The biopsy was successful.  Gastroenterology did a fantastic job.  The pathology report [id number]) showed a poorly differentiated adeno carcinoma.  I did send the specimen off for molecular profiling.  He is still jaundiced.  His bilirubin today is 7.8.  He apparently has an appointment with interventional radiology tomorrow.  Hopefully, they will be able to adjust his stent a little bit so that he will be able to drain his bile a little bit better.  He is having some discomfort.  This is where he has the drainage tube into his side.  He and his wife did have a nice Thanksgiving.  He seems to be eating okay.  He has had no diarrhea.  I do think that he would benefit from Creon.  I think his pancreas probably is not working all that well.  I think we gave him some Creon, this will help with digestion.  He has had no problems with leg swelling.  He has had no bleeding.  Currently, his performance status is ECOG 1.  Medications:  Current Outpatient Medications:  .  allopurinol (ZYLOPRIM) 300 MG tablet, Take 300 mg by mouth daily., Disp: , Rfl:  .  Aspirin-Acetaminophen-Caffeine (EXCEDRIN MIGRAINE PO), Take 1 tablet by  mouth daily as needed (migraine)., Disp: , Rfl:  .  dexamethasone (DECADRON) 4 MG tablet, Take 2 tablets (8 mg total) by mouth daily. Start the day after chemotherapy for 3 days. Take with food., Disp: 8 tablet, Rfl: 5 .  Lactulose 20 GM/30ML SOLN, Take 30 mLs (20 g total) by mouth 2 (two) times daily at 10 AM and 5 PM. Please take 2 Tablespoons TWICE a day!!, Disp: 1000 mL, Rfl: 6 .  lidocaine-prilocaine (EMLA) cream, Apply to affected area once, Disp: 30 g, Rfl: 3 .  lipase/protease/amylase (CREON) 36000 UNITS CPEP capsule, Take 2 capsules (72,000 Units total) by mouth 3 (three) times daily before meals., Disp: 180 capsule, Rfl: 6 .  loperamide (IMODIUM A-D) 2 MG tablet, Take 2 at onset of diarrhea, then 1 every 2hrs until 12hr without a BM. May take 2 tab every 4hrs at bedtime. If diarrhea recurs repeat., Disp: 100 tablet, Rfl: 1 .  LORazepam (ATIVAN) 0.5 MG tablet, Take 1 tablet (0.5 mg total) by mouth every 6 (six) hours as needed for anxiety., Disp: 30 tablet, Rfl: 0 .  MELATONIN PO, Take 1 tablet by mouth daily., Disp: , Rfl:  .  Multiple Vitamins-Minerals (CENTRUM ADULTS PO), Take 1 tablet by mouth daily., Disp: , Rfl:  .  ondansetron (ZOFRAN) 8 MG tablet, Take 1 tablet (8 mg total) by mouth 2 (two) times daily  as needed. Start on day 3 after chemotherapy., Disp: 30 tablet, Rfl: 1 .  pantoprazole (PROTONIX) 40 MG tablet, Take 1 tablet (40 mg total) by mouth 2 (two) times daily., Disp: 60 tablet, Rfl: 0 .  prochlorperazine (COMPAZINE) 10 MG tablet, Take 1 tablet (10 mg total) by mouth every 6 (six) hours as needed (Nausea or vomiting)., Disp: 30 tablet, Rfl: 1 .  zolpidem (AMBIEN) 5 MG tablet, Take 1 tablet (5 mg total) by mouth at bedtime as needed for sleep. (Patient not taking: Reported on 07/30/2019), Disp: 20 tablet, Rfl: 0  Allergies: No Known Allergies  Past Medical History, Surgical history, Social history, and Family History were reviewed and updated.  Review of Systems: Review of  Systems  Constitutional: Positive for appetite change and unexpected weight change.  HENT:  Negative.   Eyes: Positive for icterus.  Respiratory: Negative.   Cardiovascular: Negative.   Gastrointestinal: Positive for abdominal pain and constipation.  Endocrine: Negative.   Genitourinary: Negative.    Musculoskeletal: Negative.   Skin: Negative.   Neurological: Negative.   Hematological: Negative.   Psychiatric/Behavioral: Negative.     Physical Exam:  weight is 225 lb (102.1 kg). His temporal temperature is 98.7 F (37.1 C). His blood pressure is 120/87 and his pulse is 111 (abnormal). His respiration is 18 and oxygen saturation is 94%.   Wt Readings from Last 3 Encounters:  08/04/19 225 lb (102.1 kg)  07/25/19 236 lb (107 kg)  10/25/15 264 lb (119.7 kg)    Physical Exam Vitals signs reviewed.  Constitutional:      Comments: Overall he is a well-developed and well-nourished white male.  He does have scleral icterus.  He does have some palatal jaundice.  He is slightly jaundiced with his skin.  HENT:     Head: Normocephalic and atraumatic.  Eyes:     Pupils: Pupils are equal, round, and reactive to light.  Neck:     Musculoskeletal: Normal range of motion.  Cardiovascular:     Rate and Rhythm: Normal rate and regular rhythm.     Heart sounds: Normal heart sounds.  Pulmonary:     Effort: Pulmonary effort is normal.     Breath sounds: Normal breath sounds.  Abdominal:     General: Bowel sounds are normal.     Palpations: Abdomen is soft.     Comments: His abdomen is soft.  He has the drainage catheter from the liver, not his right side.  He has decent bowel sounds.  There is no guarding or rebound tenderness.  There is no fluid wave.  There is no palpable abdominal mass.  He has no palpable liver or spleen tip.  Musculoskeletal: Normal range of motion.        General: No tenderness or deformity.  Lymphadenopathy:     Cervical: No cervical adenopathy.  Skin:    General:  Skin is warm and dry.     Coloration: Skin is jaundiced.     Findings: No erythema or rash.  Neurological:     Mental Status: He is alert and oriented to person, place, and time.  Psychiatric:        Behavior: Behavior normal.        Thought Content: Thought content normal.        Judgment: Judgment normal.      Lab Results  Component Value Date   WBC 10.6 (H) 08/04/2019   HGB 13.7 08/04/2019   HCT 37.8 (L) 08/04/2019   MCV 94.5  08/04/2019   PLT 418 (H) 08/04/2019     Chemistry      Component Value Date/Time   NA 128 (L) 08/04/2019 0939   K 4.3 08/04/2019 0939   CL 90 (L) 08/04/2019 0939   CO2 25 08/04/2019 0939   BUN 17 08/04/2019 0939   CREATININE 0.96 08/04/2019 0939      Component Value Date/Time   CALCIUM 9.8 08/04/2019 0939   ALKPHOS 205 (H) 08/04/2019 0939   AST 64 (H) 08/04/2019 0939   ALT 79 (H) 08/04/2019 0939   BILITOT 7.8 ([place]) 08/04/2019 0939       Impression and Plan: Mr. Nodarse is a 63 year old white male.  He has metastatic pancreatic cancer.  He has a very markedly elevated CA 19-9.  I do think that his bilirubin is going to be an issue if we cannot get it down a little bit further.  This might compromise how we can treat him.  Hopefully, interventional radiology will be able to adjust his stent so that he can have a little bit of better drainage.  Ultimately, I think the long way we can really "open him up" is to treat him with chemotherapy so that we can shrink his hepatic metastasis in the pancreatic head primary.  I do think that he would be a good candidate for aggressive chemotherapy with FOLFIRINOX.  I realize this is highly aggressive.  I am sure that he is going to need white cell support afterwards.  He is supposed to have his Port-A-Cath placed this Thursday.  I thought he was going to have this done when he is in the hospital but unfortunately he got "bumped" because of emergencies.  He comes in with his wife.  I spent about an hour with  them.  They had chemotherapy teaching.  I went over the side effects.  I answered all their questions.  I think we will certainly know how he is doing by his bilirubin and by his CA 19-9.  We will try to get started next week.  I would like to try 4 cycles and then repeat his scans.   Volanda Napoleon, MD 12/1/20201:22 PM

## 2019-08-04 NOTE — Telephone Encounter (Signed)
Bilirubin 7.8.  Reported by steve in lab.  Dr. Marin Olp notified.  Patient here for MD appt

## 2019-08-04 NOTE — Progress Notes (Unsigned)
Patient in chemotherapy education class with  Wife Dianna. Discussed side effects of      5FU. Oxaliplatin, Leucovorin, Camptosar                          which include but are not limited to myelosuppression, decreased appetite, fatigue, fever, allergic or infusional reaction, mucositis, cardiac toxicity, cough, SOB, altered taste, nausea and vomiting, diarrhea, constipation, elevated LFTs myalgia and arthralgias, hair loss or thinning, rash, skin dryness, nail changes, peripheral neuropathy, discolored urine, delayed wound healing, mental changes (Chemo brain), increased risk of infections, weight loss.  Reviewed infusion room and office policy and procedure and phone numbers 24 hours x 7 days a week.  Reviewed ambulatory pump specifics and how to manage safe handling at home.  Reviewed when to call the office with any concerns or problems.  Scientist, clinical (histocompatibility and immunogenetics) given.  Discussed portacath insertion and EMLA cream administration.  Antiemetic protocol and chemotherapy schedule reviewed. Patient verbalized understanding of chemotherapy indications and possible side effects.  Teachback done

## 2019-08-05 ENCOUNTER — Ambulatory Visit
Admission: RE | Admit: 2019-08-05 | Discharge: 2019-08-05 | Disposition: A | Discharge status: Home / Self Care | Payer: BC Managed Care – PPO | Source: Ambulatory Visit | Attending: Radiology | Admitting: Radiology

## 2019-08-05 ENCOUNTER — Ambulatory Visit
Admission: RE | Admit: 2019-08-05 | Discharge: 2019-08-05 | Disposition: A | Discharge status: Home / Self Care | Payer: BC Managed Care – PPO | Source: Ambulatory Visit | Attending: Hematology & Oncology | Admitting: Hematology & Oncology

## 2019-08-05 ENCOUNTER — Other Ambulatory Visit: Payer: Self-pay | Admitting: Radiology

## 2019-08-05 ENCOUNTER — Encounter: Payer: Self-pay | Admitting: Radiology

## 2019-08-05 DIAGNOSIS — K831 Obstruction of bile duct: Secondary | ICD-10-CM

## 2019-08-05 HISTORY — PX: IR RADIOLOGIST EVAL & MGMT: IMG5224

## 2019-08-05 LAB — CANCER ANTIGEN 19-9: CA 19-9: 9646 U/mL — ABNORMAL HIGH (ref 0–35)

## 2019-08-05 NOTE — Progress Notes (Signed)
Referring Physician(s): Dr. Therisa Doyne  Chief Complaint: The patient is seen in follow up today s/p internal/external biliary drain placement 11/22 for biliary obstruction  History of present illness: Jeffrey Harris is a 63 year old male who originally presented to [facility] emergency department 07/23/19 for several weeks history of abdominal fullness and significant jaundice.  He was found to have metastatic pancreatic cancer with an elevated total bilirubin of 13.6.  An ERCP for biliary ductal obstruction was attempted however could not be performed.  IR was then consulted for internal/external biliary drain placement.  Patient underwent procedure 07/26/2019 with Dr. Annamaria Boots.  Over the next several days his bilirubin decreased and was down to 6.4 at the time of discharge on 07/30/2019.  He presents to radiology clinic today for follow-up of his biliary drain placement.  Jeffrey Harris states he has been doing well at home.  He has been able to eat and drink with tolerance.  States that he has "good days and bad days" but does not qualify this with many symptoms.  He denies nausea, vomiting, abdominal pain, worsening jaundice or scleral icterus.  Does endorse occasional constipation which has improved with medication.  He denies concerns related to his drain.  He does bring a drain log which shows significant output of approximately 200 mL/day. He reports he has an appointment with [facility] tomorrow morning for a Port-A-Cath placement at the request of Dr. Marin Olp for plans for upcoming chemotherapy.   Past Medical History:  Diagnosis Date  . Goals of care, counseling/discussion 07/29/2019  . Gout   . Hyperbilirubinemia 07/2019  . Pancreatic cancer metastasized to liver [facility]) 07/29/2019    Past Surgical History:  Procedure Laterality Date  . BIOPSY  07/25/2019   Procedure: BIOPSY;  Surgeon: Ronnette Juniper, MD;  Location: [facility] ENDOSCOPY;  Service: Gastroenterology;;  .  ESOPHAGOGASTRODUODENOSCOPY  07/25/2019   Procedure: ESOPHAGOGASTRODUODENOSCOPY (EGD);  Surgeon: Ronnette Juniper, MD;  Location: [facility] ENDOSCOPY;  Service: Gastroenterology;;  . IR INT EXT BILIARY DRAIN WITH CHOLANGIOGRAM  07/26/2019  . IR RADIOLOGIST EVAL & MGMT  08/05/2019  . KNEE ARTHROSCOPY      Allergies: Patient has no known allergies.  Medications: Prior to Admission medications   Medication Sig Start Date End Date Taking? Authorizing Provider  allopurinol (ZYLOPRIM) 300 MG tablet Take 300 mg by mouth daily.    [provider]  Aspirin-Acetaminophen-Caffeine (EXCEDRIN MIGRAINE PO) Take 1 tablet by mouth daily as needed (migraine).    [provider]  dexamethasone (DECADRON) 4 MG tablet Take 2 tablets (8 mg total) by mouth daily. Start the day after chemotherapy for 3 days. Take with food. 08/03/19   Volanda Napoleon, MD  Lactulose 20 GM/30ML SOLN Take 30 mLs (20 g total) by mouth 2 (two) times daily at 10 AM and 5 PM. Please take 2 Tablespoons TWICE a day!! 08/04/19   Volanda Napoleon, MD  lidocaine-prilocaine (EMLA) cream Apply to affected area once 08/03/19   Volanda Napoleon, MD  lipase/protease/amylase (CREON) 36000 UNITS CPEP capsule Take 2 capsules (72,000 Units total) by mouth 3 (three) times daily before meals. 08/04/19   Volanda Napoleon, MD  loperamide (IMODIUM A-D) 2 MG tablet Take 2 at onset of diarrhea, then 1 every 2hrs until 12hr without a BM. May take 2 tab every 4hrs at bedtime. If diarrhea recurs repeat. 08/03/19   Volanda Napoleon, MD  LORazepam (ATIVAN) 0.5 MG tablet Take 1 tablet (0.5 mg total) by mouth every 6 (six) hours as  needed for anxiety. 08/03/19   Volanda Napoleon, MD  MELATONIN PO Take 1 tablet by mouth daily.    [provider]  Multiple Vitamins-Minerals (CENTRUM ADULTS PO) Take 1 tablet by mouth daily.    [provider]  ondansetron (ZOFRAN) 8 MG tablet Take 1 tablet (8 mg total) by mouth 2 (two) times daily as needed. Start  on day 3 after chemotherapy. 08/03/19   Volanda Napoleon, MD  pantoprazole (PROTONIX) 40 MG tablet Take 1 tablet (40 mg total) by mouth 2 (two) times daily. 07/29/19   Mikhail, Velta Addison, DO  prochlorperazine (COMPAZINE) 10 MG tablet Take 1 tablet (10 mg total) by mouth every 6 (six) hours as needed (Nausea or vomiting). 08/03/19   Volanda Napoleon, MD  zolpidem (AMBIEN) 5 MG tablet Take 1 tablet (5 mg total) by mouth at bedtime as needed for sleep. Patient not taking: Reported on 07/30/2019 07/29/19   Cristal Ford, DO     Family History  Problem Relation Age of Onset  . Diabetes Father     Social History   Socioeconomic History  . Marital status: Married    Spouse name: Not on file  . Number of children: Not on file  . Years of education: Not on file  . Highest education level: Not on file  Occupational History  . Not on file  Social Needs  . Financial resource strain: Not on file  . Food insecurity    Worry: Not on file    Inability: Not on file  . Transportation needs    Medical: Not on file    Non-medical: Not on file  Tobacco Use  . Smoking status: Never Smoker  . Smokeless tobacco: Former Systems developer    Types: Snuff  Substance and Sexual Activity  . Alcohol use: No  . Drug use: No  . Sexual activity: Not Currently  Lifestyle  . Physical activity    Days per week: Not on file    Minutes per session: Not on file  . Stress: Not on file  Relationships  . Social Herbalist on phone: Not on file    Gets together: Not on file    Attends religious service: Not on file    Active member of club or organization: Not on file    Attends meetings of clubs or organizations: Not on file    Relationship status: Not on file  Other Topics Concern  . Not on file  Social History Narrative  . Not on file     Vital Signs: BP 115/81   Pulse (!) 102   Temp 98.5 F (36.9 C)   SpO2 97%   Physical Exam  NAD, alert Abdomen: soft, non-tender, non-distended.  RUQ drain  in place with bilious output.  Flushes easily.   Imaging: Ir Radiologist Eval & Mgmt  Result Date: 08/05/2019 Please refer to notes tab for details about interventional procedure. (Op Note)   Labs:  CBC: Recent Labs    07/27/19 0527 07/29/19 0213 07/30/19 2017 08/04/19 0939  WBC 8.0 5.6 7.3 10.6*  HGB 12.6* 11.6* 13.4 13.7  HCT 35.7* 32.6* 38.3* 37.8*  PLT 241 227 352 418*    COAGS: Recent Labs    07/23/19 1100 07/25/19 0231 07/26/19 0250  INR 1.1 1.2 1.2    BMP: Recent Labs    07/27/19 0527 07/29/19 0213 07/30/19 2017 08/04/19 0939  NA 132* 134* 129* 128*  K 4.0 3.8 3.4* 4.3  CL 99 103  96* 90*  CO2 26 22 23 25   GLUCOSE 145* 131* 211* 213*  BUN 13 10 10 17   CALCIUM 8.5* 8.2* 8.8* 9.8  CREATININE 0.79 0.69 0.99 0.96  GFRNONAA >60 >60 >60 >60  GFRAA >60 >60 >60 >60    LIVER FUNCTION TESTS: Recent Labs    07/27/19 0527 07/29/19 0213 07/30/19 2017 08/04/19 0939  BILITOT 6.9* 6.0* 6.4* 7.8*  AST 78* 51* 70* 64*  ALT 128* 84* 95* 79*  ALKPHOS 246* 169* 197* 205*  PROT 6.5 6.1* 7.4 8.3*  ALBUMIN 3.1* 2.7* 3.3* 4.3    Assessment: Pancreatic cancer s/p internal/external biliary drain placement 07/26/19 by Dr. Annamaria Boots Jeffrey Harris is a 63 year old male with recent diagnosis of metastatic pancreatic cancer.  He underwent internal/external biliary drain placement 07/26/2019 for biliary obstruction with significant jaundice.  Plan at that time was to proceed with internal stent placement once able.  He presents to radiology clinic today for follow-up. Jeffrey Harris states he has been doing well at home.  He has no complaints today.  He remains without symptoms despite his persistent elevated bilirubin.  In fact, recent lab work performed at the cancer center shows that his bilirubin is now slightly elevated compared to day of discharge, now at 7.8. Drain injection performed today and reviewed by Dr. Anselm Pancoast.  Unfortunately, no contrast passes through the internal portion  of the drain.  He has adequate drainage via the external portion.  He should maintain drainage to gravity for now.  His wife has been routinely flushing the drain once a day at home.  This should be continued.  Further investigation will need to be made to determine why internal portion of the drain does not appear to be working at this time.  Please see Dr. Moises Blood attestation of this note for further comment. Follow-up plans to be determined.   Signed: Docia Barrier, PA 08/05/2019, 1:43 PM   Please refer to Dr. Anselm Pancoast attestation of this note for management and plan.

## 2019-08-06 ENCOUNTER — Telehealth (HOSPITAL_COMMUNITY): Payer: Self-pay

## 2019-08-06 ENCOUNTER — Ambulatory Visit (HOSPITAL_COMMUNITY)
Admission: RE | Admit: 2019-08-06 | Discharge: 2019-08-06 | Disposition: A | Discharge status: Home / Self Care | Payer: BC Managed Care – PPO | Source: Ambulatory Visit | Attending: Hematology & Oncology | Admitting: Hematology & Oncology

## 2019-08-06 ENCOUNTER — Other Ambulatory Visit (HOSPITAL_COMMUNITY): Payer: Self-pay | Admitting: Diagnostic Radiology

## 2019-08-06 ENCOUNTER — Encounter: Payer: Self-pay | Admitting: Hematology & Oncology

## 2019-08-06 ENCOUNTER — Encounter (HOSPITAL_COMMUNITY): Payer: Self-pay

## 2019-08-06 ENCOUNTER — Telehealth: Payer: Self-pay | Admitting: *Deleted

## 2019-08-06 ENCOUNTER — Other Ambulatory Visit: Payer: Self-pay

## 2019-08-06 ENCOUNTER — Ambulatory Visit (HOSPITAL_COMMUNITY): Payer: BC Managed Care – PPO

## 2019-08-06 DIAGNOSIS — Z79899 Other long term (current) drug therapy: Secondary | ICD-10-CM | POA: Diagnosis not present

## 2019-08-06 DIAGNOSIS — K831 Obstruction of bile duct: Secondary | ICD-10-CM

## 2019-08-06 DIAGNOSIS — C259 Malignant neoplasm of pancreas, unspecified: Secondary | ICD-10-CM | POA: Diagnosis not present

## 2019-08-06 DIAGNOSIS — C787 Secondary malignant neoplasm of liver and intrahepatic bile duct: Secondary | ICD-10-CM | POA: Diagnosis not present

## 2019-08-06 DIAGNOSIS — M109 Gout, unspecified: Secondary | ICD-10-CM | POA: Insufficient documentation

## 2019-08-06 DIAGNOSIS — Z5111 Encounter for antineoplastic chemotherapy: Secondary | ICD-10-CM | POA: Diagnosis not present

## 2019-08-06 HISTORY — PX: IR IMAGING GUIDED PORT INSERTION: IMG5740

## 2019-08-06 LAB — CBC WITH DIFFERENTIAL/PLATELET
Abs Immature Granulocytes: 0.02 10*3/uL (ref 0.00–0.07)
Basophils Absolute: 0.1 10*3/uL (ref 0.0–0.1)
Basophils Relative: 1 %
Eosinophils Absolute: 0.3 10*3/uL (ref 0.0–0.5)
Eosinophils Relative: 4 %
HCT: 39.6 % (ref 39.0–52.0)
Hemoglobin: 14.1 g/dL (ref 13.0–17.0)
Immature Granulocytes: 0 %
Lymphocytes Relative: 14 %
Lymphs Abs: 1 10*3/uL (ref 0.7–4.0)
MCH: 34.6 pg — ABNORMAL HIGH (ref 26.0–34.0)
MCHC: 35.6 g/dL (ref 30.0–36.0)
MCV: 97.1 fL (ref 80.0–100.0)
Monocytes Absolute: 0.5 10*3/uL (ref 0.1–1.0)
Monocytes Relative: 7 %
Neutro Abs: 5.2 10*3/uL (ref 1.7–7.7)
Neutrophils Relative %: 74 %
Platelets: 398 10*3/uL (ref 150–400)
RBC: 4.08 MIL/uL — ABNORMAL LOW (ref 4.22–5.81)
RDW: 11.5 % (ref 11.5–15.5)
WBC: 7 10*3/uL (ref 4.0–10.5)
nRBC: 0 % (ref 0.0–0.2)

## 2019-08-06 LAB — PROTIME-INR
INR: 1.2 (ref 0.8–1.2)
Prothrombin Time: 15.2 seconds (ref 11.4–15.2)

## 2019-08-06 MED ORDER — MIDAZOLAM HCL 2 MG/2ML IJ SOLN
INTRAMUSCULAR | Status: AC | PRN
  Administered 2019-08-06: 1 mg via INTRAVENOUS
  Administered 2019-08-06: 0.5 mg via INTRAVENOUS
  Administered 2019-08-06: 1 mg via INTRAVENOUS
  Administered 2019-08-06 (×2): 0.5 mg via INTRAVENOUS

## 2019-08-06 MED ORDER — SODIUM CHLORIDE 0.9 % IV SOLN
INTRAVENOUS | Status: DC
  Administered 2019-08-06: 11:00:00 via INTRAVENOUS

## 2019-08-06 MED ORDER — CEFAZOLIN SODIUM-DEXTROSE 2-4 GM/100ML-% IV SOLN
INTRAVENOUS | Status: AC
  Filled 2019-08-06: qty 100

## 2019-08-06 MED ORDER — METHYLPREDNISOLONE 4 MG PO TBPK: ORAL_TABLET | ORAL | 0 refills | Status: DC

## 2019-08-06 MED ORDER — HEPARIN SOD (PORK) LOCK FLUSH 100 UNIT/ML IV SOLN
INTRAVENOUS | Status: AC
  Administered 2019-08-06: 100 [IU]
  Filled 2019-08-06: qty 5

## 2019-08-06 MED ORDER — FENTANYL CITRATE (PF) 100 MCG/2ML IJ SOLN
INTRAMUSCULAR | Status: AC
  Filled 2019-08-06: qty 2

## 2019-08-06 MED ORDER — LIDOCAINE-EPINEPHRINE 1 %-1:100000 IJ SOLN
INTRAMUSCULAR | Status: AC
  Filled 2019-08-06: qty 1

## 2019-08-06 MED ORDER — FENTANYL CITRATE (PF) 100 MCG/2ML IJ SOLN
INTRAMUSCULAR | Status: AC | PRN
  Administered 2019-08-06 (×2): 25 ug via INTRAVENOUS
  Administered 2019-08-06: 50 ug via INTRAVENOUS

## 2019-08-06 MED ORDER — LIDOCAINE HCL 1 % IJ SOLN
INTRAMUSCULAR | Status: AC
  Administered 2019-08-06: 1 mL
  Filled 2019-08-06: qty 20

## 2019-08-06 MED ORDER — CEFAZOLIN SODIUM-DEXTROSE 2-4 GM/100ML-% IV SOLN
2.0000 g | INTRAVENOUS | Status: AC
  Administered 2019-08-06: 11:00:00 2 g via INTRAVENOUS

## 2019-08-06 MED ORDER — MIDAZOLAM HCL 2 MG/2ML IJ SOLN
INTRAMUSCULAR | Status: AC
  Filled 2019-08-06: qty 4

## 2019-08-06 NOTE — Telephone Encounter (Signed)
Called to schedule catheter exchange, no answer, left vm. AW  

## 2019-08-06 NOTE — Telephone Encounter (Signed)
Abdominal rash picture reviewed by Dr. Marin Olp and order received for pt to stop Allopurinol and to take Medrol dos pak.  Call placed back to patient and patient notified per order of Dr. Marin Olp to stop Allopurinol, that prescription for Medrol dos pak will be sent in for him and to notify office if rash worsens.  Pt requests that Medrol dos pak be sent to [facility] in [place].

## 2019-08-06 NOTE — H&P (Signed)
Chief Complaint: Patient was seen in consultation today for pancreatic cancer/Port-a-cath placement.  Referring Physician(s): Ennever,Peter R  Supervising Physician: Markus Daft  Patient Status: [facility] - Out-pt  History of Present Illness: Jeffrey Harris is a 63 y.o. male with a past medical history of metastatic pancreatic cancer (to liver), hyperbilirubinemia, and gout. Patient is known to IR- he had an internal/external biliary drain placement in IR 07/26/2019 by Dr. Annamaria Boots while he was admitted to [facility] (07/23/2019 to 07/29/2019) for management of obstructive jaundice (found to be caused by pancreatic mass). He was unfortunately diagnosed with pancreatic cancer in 07/2019. His cancer is managed by Dr. Marin Olp. He has tentative plans to begin systemic chemotherapy for management. He had plans for Port-a-cath placement in IR while inpatient at [facility] during recent admission, however due to IR scheduling, patient had to be rescheduled on an outpatient basis.  IR consulted by Dr. Marin Olp for possible image-guided Port-a-cath placement. Patient awake and alert laying in bed with no complaints at this time. Denies fever, chills, chest pain, dyspnea, abdominal pain, or headache.   Past Medical History:  Diagnosis Date   Goals of care, counseling/discussion 07/29/2019   Gout    Hyperbilirubinemia 07/2019   Pancreatic cancer metastasized to liver ([place]) 07/29/2019    Past Surgical History:  Procedure Laterality Date   BIOPSY  07/25/2019   Procedure: BIOPSY;  Surgeon: Ronnette Juniper, MD;  Location: [facility] ENDOSCOPY;  Service: Gastroenterology;;   ESOPHAGOGASTRODUODENOSCOPY  07/25/2019   Procedure: ESOPHAGOGASTRODUODENOSCOPY (EGD);  Surgeon: Ronnette Juniper, MD;  Location: [facility] ENDOSCOPY;  Service: Gastroenterology;;   IR INT EXT BILIARY DRAIN WITH CHOLANGIOGRAM  07/26/2019   IR RADIOLOGIST EVAL & MGMT  08/05/2019   KNEE ARTHROSCOPY      Allergies: Patient has no known  allergies.  Medications: Prior to Admission medications   Medication Sig Start Date End Date Taking? Authorizing Provider  allopurinol (ZYLOPRIM) 300 MG tablet Take 300 mg by mouth daily.    [provider]  Aspirin-Acetaminophen-Caffeine (EXCEDRIN MIGRAINE PO) Take 1 tablet by mouth daily as needed (migraine).    [provider]  dexamethasone (DECADRON) 4 MG tablet Take 2 tablets (8 mg total) by mouth daily. Start the day after chemotherapy for 3 days. Take with food. 08/03/19   Volanda Napoleon, MD  Lactulose 20 GM/30ML SOLN Take 30 mLs (20 g total) by mouth 2 (two) times daily at 10 AM and 5 PM. Please take 2 Tablespoons TWICE a day!! 08/04/19   Volanda Napoleon, MD  lidocaine-prilocaine (EMLA) cream Apply to affected area once 08/03/19   Volanda Napoleon, MD  lipase/protease/amylase (CREON) 36000 UNITS CPEP capsule Take 2 capsules (72,000 Units total) by mouth 3 (three) times daily before meals. 08/04/19   Volanda Napoleon, MD  loperamide (IMODIUM A-D) 2 MG tablet Take 2 at onset of diarrhea, then 1 every 2hrs until 12hr without a BM. May take 2 tab every 4hrs at bedtime. If diarrhea recurs repeat. 08/03/19   Volanda Napoleon, MD  LORazepam (ATIVAN) 0.5 MG tablet Take 1 tablet (0.5 mg total) by mouth every 6 (six) hours as needed for anxiety. 08/03/19   Volanda Napoleon, MD  MELATONIN PO Take 1 tablet by mouth daily.    [provider]  Multiple Vitamins-Minerals (CENTRUM ADULTS PO) Take 1 tablet by mouth daily.    [provider]  ondansetron (ZOFRAN) 8 MG tablet Take 1 tablet (8 mg total) by mouth 2 (two) times daily as needed. Start on day  3 after chemotherapy. 08/03/19   Volanda Napoleon, MD  pantoprazole (PROTONIX) 40 MG tablet Take 1 tablet (40 mg total) by mouth 2 (two) times daily. 07/29/19   Mikhail, Velta Addison, DO  prochlorperazine (COMPAZINE) 10 MG tablet Take 1 tablet (10 mg total) by mouth every 6 (six) hours as needed (Nausea or vomiting). 08/03/19    Volanda Napoleon, MD  zolpidem (AMBIEN) 5 MG tablet Take 1 tablet (5 mg total) by mouth at bedtime as needed for sleep. Patient not taking: Reported on 07/30/2019 07/29/19   Cristal Ford, DO     Family History  Problem Relation Age of Onset   Diabetes Father     Social History   Socioeconomic History   Marital status: Married    Spouse name: Not on file   Number of children: Not on file   Years of education: Not on file   Highest education level: Not on file  Occupational History   Not on file  Social Needs   Financial resource strain: Not on file   Food insecurity    Worry: Not on file    Inability: Not on file   Transportation needs    Medical: Not on file    Non-medical: Not on file  Tobacco Use   Smoking status: Never Smoker   Smokeless tobacco: Former Systems developer    Types: Snuff  Substance and Sexual Activity   Alcohol use: No   Drug use: No   Sexual activity: Not Currently  Lifestyle   Physical activity    Days per week: Not on file    Minutes per session: Not on file   Stress: Not on file  Relationships   Social connections    Talks on phone: Not on file    Gets together: Not on file    Attends religious service: Not on file    Active member of club or organization: Not on file    Attends meetings of clubs or organizations: Not on file    Relationship status: Not on file  Other Topics Concern   Not on file  Social History Narrative   Not on file     Review of Systems: A 12 point ROS discussed and pertinent positives are indicated in the HPI above.  All other systems are negative.  Review of Systems  Constitutional: Negative for chills and fever.  Respiratory: Negative for shortness of breath and wheezing.   Cardiovascular: Negative for chest pain and palpitations.  Gastrointestinal: Negative for abdominal pain.  Neurological: Negative for headaches.  Psychiatric/Behavioral: Negative for behavioral problems and confusion.     Vital Signs: BP 138/89    Pulse 97    Temp 98.3 F (36.8 C) (Oral)    Resp 18    SpO2 99%   Physical Exam Vitals signs and nursing note reviewed.  Constitutional:      General: He is not in acute distress.    Appearance: Normal appearance.  Eyes:     General: Scleral icterus present.  Cardiovascular:     Rate and Rhythm: Normal rate and regular rhythm.     Heart sounds: Normal heart sounds. No murmur.  Pulmonary:     Effort: Pulmonary effort is normal. No respiratory distress.     Breath sounds: Normal breath sounds. No wheezing.  Skin:    General: Skin is warm and dry.  Neurological:     Mental Status: He is alert and oriented to person, place, and time.      MD  Evaluation Airway: WNL Heart: WNL Abdomen: WNL Chest/ Lungs: WNL ASA  Classification: 3 Mallampati/Airway Score: One   Imaging: Ct Chest W Contrast  Result Date: 07/24/2019 CLINICAL DATA:  Findings on previous day's abdomen and pelvis CT consistent with pancreatic carcinoma and metastatic disease. EXAM: CT CHEST WITH CONTRAST TECHNIQUE: Multidetector CT imaging of the chest was performed during intravenous contrast administration. CONTRAST:  20m OMNIPAQUE IOHEXOL 300 MG/ML  SOLN COMPARISON:  Abdomen and pelvis CT, 07/23/2019. FINDINGS: Cardiovascular: Heart is normal in size and configuration. No pericardial effusion 2 vessel coronary artery calcifications. Great vessels are normal in caliber. Tiny focus of atherosclerotic calcification along the descending thoracic aorta. Mediastinum/Nodes: Prominent thyroid gland with small calcifications, no defined nodule. No mediastinal or hilar masses. No enlarged lymph nodes. Trachea and esophagus are unremarkable. Lungs/Pleura: Lungs are clear. No pleural effusion or pneumothorax. Upper Abdomen: No acute finding or change from the previous day's abdominopelvic CT Musculoskeletal: No fracture or acute finding.  No bone lesion. IMPRESSION: 1. No acute findings in the chest.  2. No evidence of metastatic disease or a primary neoplasm in the chest. 3. Prominent thyroid gland with calcifications. Consider non urgent follow-up thyroid ultrasound further assessment. 4. Two vessel coronary artery calcifications and minimal aortic atherosclerosis. Aortic Atherosclerosis (ICD10-I70.0). Electronically Signed   By: DLajean ManesM.D.   On: 07/24/2019 16:54   Ct Abdomen Pelvis W Contrast  Result Date: 07/30/2019 CLINICAL DATA:  Generalized abdominal pain, history of pancreatic carcinoma with metastatic liver disease and biliary drainage catheter EXAM: CT ABDOMEN AND PELVIS WITH CONTRAST TECHNIQUE: Multidetector CT imaging of the abdomen and pelvis was performed using the standard protocol following bolus administration of intravenous contrast. CONTRAST:  1030mOMNIPAQUE IOHEXOL 300 MG/ML  SOLN COMPARISON:  07/23/2019 FINDINGS: Lower chest: Bibasilar atelectatic changes are noted. These are new from the prior exam. Hepatobiliary: Liver again demonstrates multiple hypodense lesions consistent with metastatic disease. A biliary drainage catheter is identified entering the right lobe and extending into the duodenum stable in appearance from prior examination from 07/26/2019. Gallbladder demonstrates air within consistent with the biliary drainage tube. No definitive cholelithiasis is seen. The gallbladder is predominately decompressed. Pancreas: Pancreas is well visualized. The known pancreatic head mass is stable in appearance. The remainder of the pancreas is unremarkable. Spleen: Normal in size without focal abnormality. Adrenals/Urinary Tract: Right adrenal gland is within normal limits. Left adrenal lesions are again noted and stable. 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 The kidneys show no evidence of renal calculi or urinary tract obstructive changes. Right kidney is somewhat ptotic within the pelvis. Malrotation defect is noted without obstructive change. Bladder is well distended.  Stomach/Bowel: The appendix is within normal limits. Colon shows no obstructive or inflammatory changes. Small bowel and stomach are unremarkable. Vascular/Lymphatic: Aortic atherosclerotic changes noted. Small peripancreatic lymph nodes are again seen and stable. Small lymph node is noted adjacent to the GE junction. These are stable from the recent exam. Reproductive: Prostate is unremarkable. Other: No abdominal wall hernia or abnormality. No abdominopelvic ascites. Musculoskeletal: Degenerative changes of lumbar spine are noted. IMPRESSION: Changes consistent with the known pancreatic mass with liver metastatic disease and lymphadenopathy are noted. New biliary drainage catheter is noted in satisfactory position. No perihepatic fluid collection is seen. New bibasilar atelectatic changes. No other focal abnormality is noted. Electronically Signed   By: MaInez Catalina.D.   On: 07/30/2019 23:39   Ct Abdomen Pelvis W Contrast  Result Date: 07/24/2019 CLINICAL DATA:  Painless jaundice for 1 week.  Unintentional weight loss over past 3 months. EXAM: CT ABDOMEN AND PELVIS WITH CONTRAST TECHNIQUE: Multidetector CT imaging of the abdomen and pelvis was performed using the standard protocol following bolus administration of intravenous contrast. CONTRAST:  167m OMNIPAQUE IOHEXOL 300 MG/ML  SOLN COMPARISON:  None. FINDINGS: Lower Chest: No acute findings. Hepatobiliary: Multiple small hypovascular lesions are seen throughout the right and left lobes. Largest lesion is seen in segment 4A, measuring 2.1 x 1.5 cm on image 12/3. These are consistent with diffuse pulmonary metastases. The gallbladder is distended, however there are no signs of acute cholecystitis. Diffuse biliary ductal dilatation is seen to the level of the pancreatic head. Pancreas: Subtle ill-defined soft tissue prominence is seen in the pancreatic head which measures 2.8 x 2.6 cm, and is suspicious for pancreatic carcinoma. No evidence of pancreatic  ductal dilatation or peripancreatic inflammatory changes. Spleen: Within normal limits in size and appearance. Adrenals/Urinary Tract: A 2.4 cm fat attenuation mass is seen in the left adrenal gland, consistent with a benign myelolipoma. The right adrenal gland and left kidney are normal appearance. Right-sided pelvic kidney is noted which is otherwise unremarkable in appearance. No evidence of renal mass or hydronephrosis. Unremarkable unopacified urinary bladder. Stomach/Bowel: No evidence of obstruction, inflammatory process or abnormal fluid collections. Normal appendix visualized. Vascular/Lymphatic: A 1.3 cm peripancreatic lymph node is seen just posterior to the portal venous confluence on image 19/3. A 1.4 cm porta hepatis lymph node is seen on image 17/3. 8 mm lymph node is also seen adjacent to the GE junction on image 12/13. No other pathologically enlarged lymph nodes are identified. There is no evidence of vascular involvement of SMA, SMV, or portal vein. Reproductive:  No mass or other significant abnormality. Other:  None. Musculoskeletal:  No suspicious bone lesions identified. IMPRESSION: 1. Diffuse biliary ductal dilatation, with approximately 2.8 cm soft tissue prominence in the pancreatic head, highly suspicious for primary pancreatic carcinoma. Recommend abdomen MRI and MRCP without and with contrast for further evaluation. 2. Diffuse liver metastases. 3. Mild peripancreatic and porta hepatis lymphadenopathy, suspicious for metastatic disease. 4. 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 Electronically Signed   By: JMarlaine HindM.D.   On: 07/24/2019 07:50   Dg Ercp Biliary & Pancreatic Ducts  Result Date: 07/25/2019 CLINICAL DATA:  Obstructive jaundice, concern for pancreas mass EXAM: ERCP unsuccessful TECHNIQUE: Multiple spot images obtained with the fluoroscopic device and submitted for interpretation post-procedure. FLUOROSCOPY TIME:  Fluoroscopy Time:  52  seconds Number of Acquired Spot Images: 1 COMPARISON:  07/23/2019 FINDINGS: Single spot fluoroscopic view demonstrates the endoscope. Biliary tree not opacified. IMPRESSION: Nonvisualization of the biliary tree.  Unsuccessful ERCP. These images were submitted for radiologic interpretation only. Please see the procedural report for the amount of contrast and the fluoroscopy time utilized. Electronically Signed   By: MJerilynn Mages  Shick M.D.   On: 07/25/2019 09:32   Dg Cholangiogram  Existing Tube  Result Date: 08/05/2019 INDICATION: 63year old with pancreatic cancer and biliary drain. EXAM: CHOLANGIOGRAM THROUGH EXISTING CATHETER MEDICATIONS: None ANESTHESIA/SEDATION: None FLUOROSCOPY TIME:  Fluoroscopy Time: 48 seconds, 9 3 mGy COMPLICATIONS: None immediate. PROCEDURE: Biliary drain was injected with contrast under fluoroscopic guidance. Catheter was flushed with saline and attached to the gravity bag at the end of the procedure. FINDINGS: Stable position of the biliary drain from the placement images. The distal aspect of drain in the region of the duodenum. Contrast fills the intrahepatic biliary system and the common hepatic duct. There is mild dilatation  of the central intrahepatic bile ducts. No significant contrast in the common bile duct or the distal aspect of the drain. No significant contrast draining into the duodenum. Drain was attached to the gravity back and follow-up images were obtained with the patient in a semi upright position. The biliary system was adequately decompressed after the drain was re-attached to the gravity bag. Filling of the cystic duct and gallbladder. IMPRESSION: Stable position of the internal/external biliary drain. Biliary system is being adequately decompressed through the gravity bag. No significant contrast in the distal common bile duct and there is no significant contrast in the distal aspect of the stent or duodenum. Due to the lack of internal drainage, will continue external  drainage through the gravity bag. Electronically Signed   By: Markus Daft M.D.   On: 08/05/2019 17:24   Ir Int Lianne Cure Biliary Drain With Cholangiogram  Result Date: 07/26/2019 INDICATION: Obstructive jaundice, elevated LFTs, and ampullary/pancreatic head mass, failed ERCP EXAM: ULTRASOUND AND FLUOROSCOPIC RIGHT PTC WITH INTERNAL EXTERNAL BILIARY DRAIN MEDICATIONS: 3.375 g Zosyn; The antibiotic was administered within an appropriate time frame prior to the initiation of the procedure. ANESTHESIA/SEDATION: Moderate (conscious) sedation was employed during this procedure. A total of Versed 3.0 mg and Fentanyl 200 mcg was administered intravenously. Moderate Sedation Time: 35 minutes. The patient's level of consciousness and vital signs were monitored continuously by radiology nursing throughout the procedure under my direct supervision. FLUOROSCOPY TIME:  Fluoroscopy Time: 4 minutes 18 seconds (94 mGy). COMPLICATIONS: None immediate. PROCEDURE: Informed written consent was obtained from the patient after a thorough discussion of the procedural risks, benefits and alternatives. All questions were addressed. Maximal Sterile Barrier Technique was utilized including caps, mask, sterile gowns, sterile gloves, sterile drape, hand hygiene and skin antiseptic. A timeout was performed prior to the initiation of the procedure. Previous imaging reviewed. In the right mid axillary line through a lower intercostal space, ultrasound percutaneous needle access eventually performed of a peripheral right hepatic duct. There was return of blood tinged bile. Contrast injection performed for limited cholangiogram. Biliary obstruction noted with mild dilatation. 018 guidewire advanced centrally into the common bile duct followed by the Accustick dilator set. Amplatz guidewire exchange performed. Kumpe catheter advanced through the distal CBD obstruction with a Glidewire. Catheter advanced into the duodenum. Contrast injection confirms  position. Amplatz guidewire inserted. Tract dilatation performed to insert a 10 [place] internal external biliary drain with the distal loop in the duodenum. Biliary system decompressed by syringe aspiration yielding 100 cc bile. Sample sent for culture. Images obtained for documentation. Catheter secured with Prolene suture and external gravity drainage bag. Sterile dressing applied. No immediate complication. Patient tolerated the procedure well. IMPRESSION: Successful right PTC with 10 [place] internal external biliary drain insertion for distal CBD obstruction Electronically Signed   By: Jerilynn Mages.  Shick M.D.   On: 07/26/2019 11:08   Ir Radiologist Eval & Mgmt  Result Date: 08/05/2019 Please refer to notes tab for details about interventional procedure. (Op Note)  US Abdomen Limited Ruq  Result Date: 07/23/2019 CLINICAL DATA:  Abdominal discomfort and jaundice EXAM: ULTRASOUND ABDOMEN LIMITED RIGHT UPPER QUADRANT COMPARISON:  None. FINDINGS: Gallbladder: No gallstones or wall thickening visualized. No sonographic Murphy sign noted by sonographer. Common bile duct: Diameter: 9 mm Liver: Heterogeneous echotexture. Ill-defined 2.1 x 2.1 x 1.5 cm hypoechoic area in the right lobe and 1.7 x 1.6 x 1.5 cm hypoechoic area in the left lobe. Portal vein is patent on color Doppler imaging with normal direction  of blood flow towards the liver. Other: None. IMPRESSION: Dilatation of the common bile duct. Heterogeneous appearance of the liver with two nonspecific ill-defined hypoechoic areas. No evidence of acute cholecystitis. Electronically Signed   By: Macy Mis M.D.   On: 07/23/2019 11:23    Labs:  CBC: Recent Labs    07/27/19 0527 07/29/19 0213 07/30/19 2017 08/04/19 0939  WBC 8.0 5.6 7.3 10.6*  HGB 12.6* 11.6* 13.4 13.7  HCT 35.7* 32.6* 38.3* 37.8*  PLT 241 227 352 418*    COAGS: Recent Labs    07/23/19 1100 07/25/19 0231 07/26/19 0250  INR 1.1 1.2 1.2    BMP: Recent Labs     07/27/19 0527 07/29/19 0213 07/30/19 2017 08/04/19 0939  NA 132* 134* 129* 128*  K 4.0 3.8 3.4* 4.3  CL 99 103 96* 90*  CO2 _0 GLUCOSE 145* 131* 211* 213*  BUN _1 CALCIUM 8.5* 8.2* 8.8* 9.8  CREATININE 0.79 0.69 0.99 0.96  GFRNONAA >60 >60 >60 >60  GFRAA >60 >60 >60 >60    LIVER FUNCTION TESTS: Recent Labs    07/27/19 0527 07/29/19 0213 07/30/19 2017 08/04/19 0939  BILITOT 6.9* 6.0* 6.4* 7.8*  AST 78* 51* 70* 64*  ALT 128* 84* 95* 79*  ALKPHOS 246* 169* 197* 205*  PROT 6.5 6.1* 7.4 8.3*  ALBUMIN 3.1* 2.7* 3.3* 4.3     Assessment and Plan:  Pancreatic cancer with tentative plans to begin systemic chemotherapy. Plan for image-guided Port-a-cath placement today in IR. Patient is NPO. Afebrile. He does not take blood thinners. INR pending.  Risks and benefits of image guided port-a-catheter placement were discussed with the patient including, but not limited to bleeding, infection, pneumothorax, or fibrin sheath development and need for additional procedures. All of the patient's questions were answered, patient is agreeable to proceed. Consent signed and in chart.   Thank you for this interesting consult.  I greatly enjoyed meeting JARRIEL PAPILLION and look forward to participating in their care.  A copy of this report was sent to the requesting provider on this date.  Electronically Signed: Earley Abide, PA-C 08/06/2019, 10:15 AM   I spent a total of 40 Minutes in face to face in clinical consultation, greater than 50% of which was counseling/coordinating care for pancreatic cancer/Port-a-cath placement.

## 2019-08-06 NOTE — Telephone Encounter (Signed)
Message received from patient's wife, Beverlee Nims stating that patient has a new rash on his abdomen.  Call placed back to patient and patient states that the rash is "a raised, red rash to his entire abdomen, which is barely itching."  He states that the rash is on his abdomen only.  Dr. Marin Olp notified and would like for pt to send a picture of the rash via MyChart.  MyChart link texted to patient so that picture of abd rash can be uploaded.

## 2019-08-06 NOTE — Procedures (Signed)
Interventional Radiology Procedure:   Indications: Metastatic pancreatic cancer  Procedure: Port placement  Findings: Right jugular port, tip at SVC/RA junction  Complications: None     EBL: Minimal, less than 10 ml  Plan: Discharge in one hour.  Keep port site and incisions dry for at least 24 hours.     Sayed Apostol R. Jymir Dunaj, MD  Pager: [phone]    

## 2019-08-06 NOTE — Discharge Instructions (Signed)
Call Interventional Radiology clinic for any problems with your port.  [phone]    Do not use EMLA cream for 2 weeks on the skin glue of your new port. The petroleum in the EMLA cream will dissolve the skin glue and the skin will come apart resulting in an infection. Use ice in a zip lock bag for 2-3 minutes over your new port prior to nurses accessing it.   You may remove your dressing ans shower tomorrow.   Implanted Port Insertion, Care After This sheet gives you information about how to care for yourself after your procedure. Your health care provider may also give you more specific instructions. If you have problems or questions, contact your health care provider. What can I expect after the procedure? After the procedure, it is common to have:  Discomfort at the port insertion site.  Bruising on the skin over the port. This should improve over 3-4 days. Follow these instructions at home: [facility] care  After your port is placed, you will get a manufacturer's information card. The card has information about your port. Keep this card with you at all times.  Take care of the port as told by your health care provider. Ask your health care provider if you or a family member can get training for taking care of the port at home. A home health care nurse may also take care of the port.  Make sure to remember what type of port you have. Incision care      Follow instructions from your health care provider about how to take care of your port insertion site. Make sure you: ? Wash your hands with soap and water before and after you change your bandage (dressing). If soap and water are not available, use hand sanitizer. ? Change your dressing as told by your health care provider. ? Leave stitches (sutures), skin glue, or adhesive strips in place. These skin closures may need to stay in place for 2 weeks or longer. If adhesive strip edges start to loosen and curl up, you may trim the loose  edges. Do not remove adhesive strips completely unless your health care provider tells you to do that.  Check your port insertion site every day for signs of infection. Check for: ? Redness, swelling, or pain. ? Fluid or blood. ? Warmth. ? Pus or a bad smell. Activity  Return to your normal activities as told by your health care provider. Ask your health care provider what activities are safe for you.  Do not lift anything that is heavier than 10 lb (4.5 kg), or the limit that you are told, until your health care provider says that it is safe. General instructions  Take over-the-counter and prescription medicines only as told by your health care provider.  Do not take baths, swim, or use a hot tub until your health care provider approves. Ask your health care provider if you may take showers. You may only be allowed to take sponge baths.  Do not drive for 24 hours if you were given a sedative during your procedure.  Wear a medical alert bracelet in case of an emergency. This will tell any health care providers that you have a port.  Keep all follow-up visits as told by your health care provider. This is important. Contact a health care provider if:  You cannot flush your port with saline as directed, or you cannot draw blood from the port.  You have a fever or chills.  You  have redness, swelling, or pain around your port insertion site.  You have fluid or blood coming from your port insertion site.  Your port insertion site feels warm to the touch.  You have pus or a bad smell coming from the port insertion site. Get help right away if:  You have chest pain or shortness of breath.  You have bleeding from your port that you cannot control. Summary  Take care of the port as told by your health care provider. Keep the manufacturer's information card with you at all times.  Change your dressing as told by your health care provider.  Contact a health care provider if you  have a fever or chills or if you have redness, swelling, or pain around your port insertion site.  Keep all follow-up visits as told by your health care provider. This information is not intended to replace advice given to you by your health care provider. Make sure you discuss any questions you have with your health care provider. Document Released: 06/10/2013 Document Revised: 03/18/2018 Document Reviewed: 03/18/2018 Elsevier Patient Education  [place]. Moderate Conscious Sedation, Adult, Care After These instructions provide you with information about caring for yourself after your procedure. Your health care provider may also give you more specific instructions. Your treatment has been planned according to current medical practices, but problems sometimes occur. Call your health care provider if you have any problems or questions after your procedure. What can I expect after the procedure? After your procedure, it is common:  To feel sleepy for several hours.  To feel clumsy and have poor balance for several hours.  To have poor judgment for several hours.  To vomit if you eat too soon. Follow these instructions at home: For at least 24 hours after the procedure:   Do not: ? Participate in activities where you could fall or become injured. ? Drive. ? Use heavy machinery. ? Drink alcohol. ? Take sleeping pills or medicines that cause drowsiness. ? Make important decisions or sign legal documents. ? Take care of children on your own.  Rest. Eating and drinking  Follow the diet recommended by your health care provider.  If you vomit: ? Drink water, juice, or soup when you can drink without vomiting. ? Make sure you have little or no nausea before eating solid foods. General instructions  Have a responsible adult stay with you until you are awake and alert.  Take over-the-counter and prescription medicines only as told by your health care provider.  If you  smoke, do not smoke without supervision.  Keep all follow-up visits as told by your health care provider. This is important. Contact a health care provider if:  You keep feeling nauseous or you keep vomiting.  You feel light-headed.  You develop a rash.  You have a fever. Get help right away if:  You have trouble breathing. This information is not intended to replace advice given to you by your health care provider. Make sure you discuss any questions you have with your health care provider. Document Released: 06/10/2013 Document Revised: 08/02/2017 Document Reviewed: 12/10/2015 Elsevier Patient Education  2020 [employer].

## 2019-08-07 ENCOUNTER — Other Ambulatory Visit: Payer: Self-pay | Admitting: *Deleted

## 2019-08-07 ENCOUNTER — Encounter: Payer: Self-pay | Admitting: Hematology & Oncology

## 2019-08-07 DIAGNOSIS — C787 Secondary malignant neoplasm of liver and intrahepatic bile duct: Secondary | ICD-10-CM

## 2019-08-07 MED ORDER — LORAZEPAM 0.5 MG PO TABS: 0.5000 mg | ORAL_TABLET | Freq: Four times a day (QID) | ORAL | 0 refills | Status: DC | PRN

## 2019-08-07 MED ORDER — PROCHLORPERAZINE MALEATE 10 MG PO TABS: 10.0000 mg | ORAL_TABLET | Freq: Four times a day (QID) | ORAL | 1 refills | Status: DC | PRN

## 2019-08-07 MED ORDER — ONDANSETRON HCL 8 MG PO TABS: 8.0000 mg | ORAL_TABLET | Freq: Two times a day (BID) | ORAL | 1 refills | Status: DC | PRN

## 2019-08-07 MED ORDER — DEXAMETHASONE 4 MG PO TABS: 8.0000 mg | ORAL_TABLET | Freq: Every day | ORAL | 5 refills | Status: DC

## 2019-08-07 MED ORDER — LIDOCAINE-PRILOCAINE 2.5-2.5 % EX CREA: TOPICAL_CREAM | CUTANEOUS | 3 refills | Status: DC

## 2019-08-10 ENCOUNTER — Inpatient Hospital Stay: Payer: BC Managed Care – PPO

## 2019-08-10 ENCOUNTER — Other Ambulatory Visit: Payer: Self-pay

## 2019-08-10 VITALS — BP 124/71 | HR 66 | Temp 97.1°F | Resp 17 | Ht 71.0 in | Wt 220.0 lb

## 2019-08-10 DIAGNOSIS — C250 Malignant neoplasm of head of pancreas: Secondary | ICD-10-CM | POA: Diagnosis not present

## 2019-08-10 DIAGNOSIS — R17 Unspecified jaundice: Secondary | ICD-10-CM | POA: Diagnosis not present

## 2019-08-10 DIAGNOSIS — Z938 Other artificial opening status: Secondary | ICD-10-CM | POA: Diagnosis not present

## 2019-08-10 DIAGNOSIS — Z79899 Other long term (current) drug therapy: Secondary | ICD-10-CM | POA: Diagnosis not present

## 2019-08-10 DIAGNOSIS — C787 Secondary malignant neoplasm of liver and intrahepatic bile duct: Secondary | ICD-10-CM

## 2019-08-10 DIAGNOSIS — R197 Diarrhea, unspecified: Secondary | ICD-10-CM | POA: Diagnosis not present

## 2019-08-10 DIAGNOSIS — R978 Other abnormal tumor markers: Secondary | ICD-10-CM | POA: Diagnosis not present

## 2019-08-10 DIAGNOSIS — G893 Neoplasm related pain (acute) (chronic): Secondary | ICD-10-CM | POA: Diagnosis not present

## 2019-08-10 DIAGNOSIS — C259 Malignant neoplasm of pancreas, unspecified: Secondary | ICD-10-CM

## 2019-08-10 DIAGNOSIS — Z5111 Encounter for antineoplastic chemotherapy: Secondary | ICD-10-CM | POA: Diagnosis not present

## 2019-08-10 LAB — CMP (CANCER CENTER ONLY)
ALT: 122 U/L — ABNORMAL HIGH (ref 0–44)
AST: 69 U/L — ABNORMAL HIGH (ref 15–41)
Albumin: 3.7 g/dL (ref 3.5–5.0)
Alkaline Phosphatase: 206 U/L — ABNORMAL HIGH (ref 38–126)
Anion gap: 8 (ref 5–15)
BUN: 18 mg/dL (ref 8–23)
CO2: 23 mmol/L (ref 22–32)
Calcium: 7.7 mg/dL — ABNORMAL LOW (ref 8.9–10.3)
Chloride: 102 mmol/L (ref 98–111)
Creatinine: 0.58 mg/dL — ABNORMAL LOW (ref 0.61–1.24)
GFR, Est AFR Am: >60 mL/min (ref >60)
GFR, Estimated: >60 mL/min (ref >60)
Glucose, Bld: 189 mg/dL — ABNORMAL HIGH (ref 70–99)
Potassium: 3.3 mmol/L — ABNORMAL LOW (ref 3.5–5.1)
Sodium: 133 mmol/L — ABNORMAL LOW (ref 135–145)
Total Bilirubin: 3.4 mg/dL — ABNORMAL HIGH (ref 0.3–1.2)
Total Protein: 6.5 g/dL (ref 6.5–8.1)

## 2019-08-10 LAB — CBC WITH DIFFERENTIAL (CANCER CENTER ONLY)
Abs Immature Granulocytes: 0.03 10*3/uL (ref 0.00–0.07)
Basophils Absolute: 0.1 10*3/uL (ref 0.0–0.1)
Basophils Relative: 1 %
Eosinophils Absolute: 0.7 10*3/uL — ABNORMAL HIGH (ref 0.0–0.5)
Eosinophils Relative: 7 %
HCT: 38.3 % — ABNORMAL LOW (ref 39.0–52.0)
Hemoglobin: 13.5 g/dL (ref 13.0–17.0)
Immature Granulocytes: 0 %
Lymphocytes Relative: 14 %
Lymphs Abs: 1.4 10*3/uL (ref 0.7–4.0)
MCH: 33.7 pg (ref 26.0–34.0)
MCHC: 35.2 g/dL (ref 30.0–36.0)
MCV: 95.5 fL (ref 80.0–100.0)
Monocytes Absolute: 0.7 10*3/uL (ref 0.1–1.0)
Monocytes Relative: 7 %
Neutro Abs: 7 10*3/uL (ref 1.7–7.7)
Neutrophils Relative %: 71 %
Platelet Count: 405 10*3/uL — ABNORMAL HIGH (ref 150–400)
RBC: 4.01 MIL/uL — ABNORMAL LOW (ref 4.22–5.81)
RDW: 11.3 % — ABNORMAL LOW (ref 11.5–15.5)
WBC Count: 9.9 10*3/uL (ref 4.0–10.5)
nRBC: 0 % (ref 0.0–0.2)

## 2019-08-10 MED ORDER — FLUOROURACIL CHEMO INJECTION 2.5 GM/50ML
400.0000 mg/m2 | Freq: Once | INTRAVENOUS | Status: AC
  Administered 2019-08-10: 16:00:00 950 mg via INTRAVENOUS
  Filled 2019-08-10: qty 19

## 2019-08-10 MED ORDER — PALONOSETRON HCL INJECTION 0.25 MG/5ML
0.2500 mg | Freq: Once | INTRAVENOUS | Status: AC
  Administered 2019-08-10: 0.25 mg via INTRAVENOUS

## 2019-08-10 MED ORDER — PALONOSETRON HCL INJECTION 0.25 MG/5ML
INTRAVENOUS | Status: AC
  Filled 2019-08-10: qty 5

## 2019-08-10 MED ORDER — SODIUM CHLORIDE 0.9% FLUSH
10.0000 mL | INTRAVENOUS | Status: DC | PRN
  Filled 2019-08-10: qty 10

## 2019-08-10 MED ORDER — DEXTROSE 5 % IV SOLN
Freq: Once | INTRAVENOUS | Status: AC
  Administered 2019-08-10: 11:00:00 via INTRAVENOUS
  Filled 2019-08-10: qty 250

## 2019-08-10 MED ORDER — LEUCOVORIN CALCIUM INJECTION 350 MG
400.0000 mg/m2 | Freq: Once | INTRAVENOUS | Status: AC
  Administered 2019-08-10: 928 mg via INTRAVENOUS
  Filled 2019-08-10: qty 46.4

## 2019-08-10 MED ORDER — OXALIPLATIN CHEMO INJECTION 100 MG/20ML
86.0000 mg/m2 | Freq: Once | INTRAVENOUS | Status: AC
  Administered 2019-08-10: 11:00:00 200 mg via INTRAVENOUS
  Filled 2019-08-10: qty 40

## 2019-08-10 MED ORDER — HEPARIN SOD (PORK) LOCK FLUSH 100 UNIT/ML IV SOLN
500.0000 [IU] | Freq: Once | INTRAVENOUS | Status: DC | PRN
  Filled 2019-08-10: qty 5

## 2019-08-10 MED ORDER — SODIUM CHLORIDE 0.9 % IV SOLN
2400.0000 mg/m2 | INTRAVENOUS | Status: DC
  Filled 2019-08-10: qty 111

## 2019-08-10 MED ORDER — SODIUM CHLORIDE 0.9 % IV SOLN
Freq: Once | INTRAVENOUS | Status: AC
  Administered 2019-08-10: 10:00:00 via INTRAVENOUS
  Filled 2019-08-10: qty 5

## 2019-08-10 MED ORDER — IRINOTECAN HCL CHEMO INJECTION 100 MG/5ML
117.0000 mg/m2 | Freq: Once | INTRAVENOUS | Status: AC
  Administered 2019-08-10: 280 mg via INTRAVENOUS
  Filled 2019-08-10: qty 14

## 2019-08-10 MED ORDER — ATROPINE SULFATE 1 MG/ML IJ SOLN: 0.5000 mg | Freq: Once | INTRAMUSCULAR | Status: DC | PRN

## 2019-08-10 MED ORDER — DEXTROSE 5 % IV SOLN
Freq: Once | INTRAVENOUS | Status: AC
  Administered 2019-08-10: 10:00:00 via INTRAVENOUS
  Filled 2019-08-10: qty 250

## 2019-08-10 NOTE — Progress Notes (Signed)
CBC and CMET reviewed by MD, ok to treat depite labs.

## 2019-08-10 NOTE — Patient Instructions (Addendum)
[facility] Discharge Instructions for Patients Receiving Chemotherapy  Today you received the following chemotherapy agents Oxaliplatin, Iriniotecan, 5FU, Leukovorin  To help prevent nausea and vomiting after your treatment, we encourage you to take your nausea medication as directed   If you develop nausea and vomiting that is not controlled by your nausea medication, call the clinic.   BELOW ARE SYMPTOMS THAT SHOULD BE REPORTED IMMEDIATELY:  *FEVER GREATER THAN 100.5 F  *CHILLS WITH OR WITHOUT FEVER  NAUSEA AND VOMITING THAT IS NOT CONTROLLED WITH YOUR NAUSEA MEDICATION  *UNUSUAL SHORTNESS OF BREATH  *UNUSUAL BRUISING OR BLEEDING  TENDERNESS IN MOUTH AND THROAT WITH OR WITHOUT PRESENCE OF ULCERS  *URINARY PROBLEMS  *BOWEL PROBLEMS  UNUSUAL RASH Items with * indicate a potential emergency and should be followed up as soon as possible.  Feel free to call the clinic should you have any questions or concerns. The clinic phone number is [phone].  Please show the [place] at check-in to the Emergency Department and triage nurse.

## 2019-08-10 NOTE — Progress Notes (Signed)
Decrease cpt-11 dose by 35% for elevated bili per Dr Marin Olp

## 2019-08-10 NOTE — Patient Instructions (Signed)

## 2019-08-11 ENCOUNTER — Encounter: Payer: Self-pay | Admitting: Hematology & Oncology

## 2019-08-11 DIAGNOSIS — Z09 Encounter for follow-up examination after completed treatment for conditions other than malignant neoplasm: Secondary | ICD-10-CM | POA: Diagnosis not present

## 2019-08-11 DIAGNOSIS — C259 Malignant neoplasm of pancreas, unspecified: Secondary | ICD-10-CM | POA: Diagnosis not present

## 2019-08-11 DIAGNOSIS — C787 Secondary malignant neoplasm of liver and intrahepatic bile duct: Secondary | ICD-10-CM | POA: Diagnosis not present

## 2019-08-11 MED FILL — NORMAL SALINE FLUSH SYRINGE: 0.9 | 25 days supply | Qty: 250 | Fill #0

## 2019-08-12 ENCOUNTER — Inpatient Hospital Stay: Payer: BC Managed Care – PPO

## 2019-08-12 ENCOUNTER — Other Ambulatory Visit: Payer: Self-pay

## 2019-08-12 VITALS — BP 133/92 | HR 100 | Temp 97.6°F | Resp 18

## 2019-08-12 DIAGNOSIS — G893 Neoplasm related pain (acute) (chronic): Secondary | ICD-10-CM | POA: Diagnosis not present

## 2019-08-12 DIAGNOSIS — C259 Malignant neoplasm of pancreas, unspecified: Secondary | ICD-10-CM

## 2019-08-12 DIAGNOSIS — R978 Other abnormal tumor markers: Secondary | ICD-10-CM | POA: Diagnosis not present

## 2019-08-12 DIAGNOSIS — C787 Secondary malignant neoplasm of liver and intrahepatic bile duct: Secondary | ICD-10-CM | POA: Diagnosis not present

## 2019-08-12 DIAGNOSIS — Z938 Other artificial opening status: Secondary | ICD-10-CM | POA: Diagnosis not present

## 2019-08-12 DIAGNOSIS — R197 Diarrhea, unspecified: Secondary | ICD-10-CM | POA: Diagnosis not present

## 2019-08-12 DIAGNOSIS — R17 Unspecified jaundice: Secondary | ICD-10-CM | POA: Diagnosis not present

## 2019-08-12 DIAGNOSIS — C250 Malignant neoplasm of head of pancreas: Secondary | ICD-10-CM | POA: Diagnosis not present

## 2019-08-12 DIAGNOSIS — Z5111 Encounter for antineoplastic chemotherapy: Secondary | ICD-10-CM | POA: Diagnosis not present

## 2019-08-12 DIAGNOSIS — Z79899 Other long term (current) drug therapy: Secondary | ICD-10-CM | POA: Diagnosis not present

## 2019-08-12 MED ORDER — SODIUM CHLORIDE 0.9% FLUSH
10.0000 mL | INTRAVENOUS | Status: DC | PRN
  Administered 2019-08-12: 14:00:00 10 mL
  Filled 2019-08-12: qty 10

## 2019-08-12 MED ORDER — PEGFILGRASTIM-JMDB 6 MG/0.6ML ~~LOC~~ SOSY
6.0000 mg | PREFILLED_SYRINGE | Freq: Once | SUBCUTANEOUS | Status: AC
  Administered 2019-08-12: 14:00:00 6 mg via SUBCUTANEOUS

## 2019-08-12 MED ORDER — HEPARIN SOD (PORK) LOCK FLUSH 100 UNIT/ML IV SOLN
500.0000 [IU] | Freq: Once | INTRAVENOUS | Status: AC | PRN
  Administered 2019-08-12: 14:00:00 500 [IU]
  Filled 2019-08-12: qty 5

## 2019-08-12 MED ORDER — PEGFILGRASTIM-JMDB 6 MG/0.6ML ~~LOC~~ SOSY
PREFILLED_SYRINGE | SUBCUTANEOUS | Status: AC
  Filled 2019-08-12: qty 0.6

## 2019-08-12 NOTE — Patient Instructions (Signed)

## 2019-08-13 ENCOUNTER — Other Ambulatory Visit: Payer: Self-pay | Admitting: Hematology & Oncology

## 2019-08-13 DIAGNOSIS — C259 Malignant neoplasm of pancreas, unspecified: Secondary | ICD-10-CM | POA: Diagnosis not present

## 2019-08-17 ENCOUNTER — Encounter: Payer: Self-pay | Admitting: Hematology & Oncology

## 2019-08-17 ENCOUNTER — Other Ambulatory Visit: Payer: Self-pay | Admitting: *Deleted

## 2019-08-17 MED ORDER — ZOLPIDEM TARTRATE 5 MG PO TABS: 5.0000 mg | ORAL_TABLET | Freq: Every evening | ORAL | 1 refills | Status: DC | PRN

## 2019-08-18 ENCOUNTER — Ambulatory Visit: Payer: BC Managed Care – PPO

## 2019-08-18 ENCOUNTER — Other Ambulatory Visit: Payer: BC Managed Care – PPO

## 2019-08-18 ENCOUNTER — Ambulatory Visit: Payer: BC Managed Care – PPO | Admitting: Family

## 2019-08-19 ENCOUNTER — Encounter: Payer: Self-pay | Admitting: *Deleted

## 2019-08-24 ENCOUNTER — Inpatient Hospital Stay: Payer: BC Managed Care – PPO

## 2019-08-24 ENCOUNTER — Encounter: Payer: Self-pay | Admitting: Hematology & Oncology

## 2019-08-24 ENCOUNTER — Other Ambulatory Visit: Payer: Self-pay

## 2019-08-24 ENCOUNTER — Inpatient Hospital Stay (HOSPITAL_BASED_OUTPATIENT_CLINIC_OR_DEPARTMENT_OTHER): Payer: BC Managed Care – PPO | Admitting: Hematology & Oncology

## 2019-08-24 VITALS — BP 106/64 | HR 72 | Temp 95.5°F | Resp 18

## 2019-08-24 VITALS — BP 104/78 | HR 95 | Temp 97.3°F | Resp 16 | Wt 208.0 lb

## 2019-08-24 DIAGNOSIS — C787 Secondary malignant neoplasm of liver and intrahepatic bile duct: Secondary | ICD-10-CM

## 2019-08-24 DIAGNOSIS — R197 Diarrhea, unspecified: Secondary | ICD-10-CM | POA: Diagnosis not present

## 2019-08-24 DIAGNOSIS — G893 Neoplasm related pain (acute) (chronic): Secondary | ICD-10-CM | POA: Diagnosis not present

## 2019-08-24 DIAGNOSIS — C259 Malignant neoplasm of pancreas, unspecified: Secondary | ICD-10-CM

## 2019-08-24 DIAGNOSIS — R978 Other abnormal tumor markers: Secondary | ICD-10-CM | POA: Diagnosis not present

## 2019-08-24 DIAGNOSIS — Z938 Other artificial opening status: Secondary | ICD-10-CM | POA: Diagnosis not present

## 2019-08-24 DIAGNOSIS — R17 Unspecified jaundice: Secondary | ICD-10-CM | POA: Diagnosis not present

## 2019-08-24 DIAGNOSIS — Z5111 Encounter for antineoplastic chemotherapy: Secondary | ICD-10-CM | POA: Diagnosis not present

## 2019-08-24 DIAGNOSIS — Z79899 Other long term (current) drug therapy: Secondary | ICD-10-CM | POA: Diagnosis not present

## 2019-08-24 DIAGNOSIS — C250 Malignant neoplasm of head of pancreas: Secondary | ICD-10-CM | POA: Diagnosis not present

## 2019-08-24 LAB — CBC WITH DIFFERENTIAL (CANCER CENTER ONLY)
Abs Immature Granulocytes: 0.11 10*3/uL — ABNORMAL HIGH (ref 0.00–0.07)
Basophils Absolute: 0.1 10*3/uL (ref 0.0–0.1)
Basophils Relative: 1 %
Eosinophils Absolute: 0 10*3/uL (ref 0.0–0.5)
Eosinophils Relative: 0 %
HCT: 37.3 % — ABNORMAL LOW (ref 39.0–52.0)
Hemoglobin: 13.4 g/dL (ref 13.0–17.0)
Immature Granulocytes: 2 %
Lymphocytes Relative: 25 %
Lymphs Abs: 1.6 10*3/uL (ref 0.7–4.0)
MCH: 32.8 pg (ref 26.0–34.0)
MCHC: 35.9 g/dL (ref 30.0–36.0)
MCV: 91.4 fL (ref 80.0–100.0)
Monocytes Absolute: 0.5 10*3/uL (ref 0.1–1.0)
Monocytes Relative: 8 %
Neutro Abs: 4.1 10*3/uL (ref 1.7–7.7)
Neutrophils Relative %: 64 %
Platelet Count: 244 10*3/uL (ref 150–400)
RBC: 4.08 MIL/uL — ABNORMAL LOW (ref 4.22–5.81)
RDW: 11.5 % (ref 11.5–15.5)
WBC Count: 6.3 10*3/uL (ref 4.0–10.5)
nRBC: 0 % (ref 0.0–0.2)

## 2019-08-24 LAB — CMP (CANCER CENTER ONLY)
ALT: 115 U/L — ABNORMAL HIGH (ref 0–44)
AST: 57 U/L — ABNORMAL HIGH (ref 15–41)
Albumin: 4.2 g/dL (ref 3.5–5.0)
Alkaline Phosphatase: 275 U/L — ABNORMAL HIGH (ref 38–126)
Anion gap: 10 (ref 5–15)
BUN: 18 mg/dL (ref 8–23)
CO2: 24 mmol/L (ref 22–32)
Calcium: 9.3 mg/dL (ref 8.9–10.3)
Chloride: 92 mmol/L — ABNORMAL LOW (ref 98–111)
Creatinine: 0.92 mg/dL (ref 0.61–1.24)
GFR, Est AFR Am: >60 mL/min (ref >60)
GFR, Estimated: >60 mL/min (ref >60)
Glucose, Bld: 207 mg/dL — ABNORMAL HIGH (ref 70–99)
Potassium: 3.6 mmol/L (ref 3.5–5.1)
Sodium: 126 mmol/L — ABNORMAL LOW (ref 135–145)
Total Bilirubin: 1.9 mg/dL — ABNORMAL HIGH (ref 0.3–1.2)
Total Protein: 7.2 g/dL (ref 6.5–8.1)

## 2019-08-24 LAB — LACTATE DEHYDROGENASE: LDH: 210 U/L — ABNORMAL HIGH (ref 98–192)

## 2019-08-24 MED ORDER — LEUCOVORIN CALCIUM INJECTION 350 MG
400.0000 mg/m2 | Freq: Once | INTRAVENOUS | Status: AC
  Administered 2019-08-24: 928 mg via INTRAVENOUS
  Filled 2019-08-24: qty 46.4

## 2019-08-24 MED ORDER — METHYLPREDNISOLONE SODIUM SUCC 125 MG IJ SOLR
125.0000 mg | Freq: Once | INTRAMUSCULAR | Status: AC
  Administered 2019-08-24: 125 mg via INTRAVENOUS

## 2019-08-24 MED ORDER — HEPARIN SOD (PORK) LOCK FLUSH 100 UNIT/ML IV SOLN
500.0000 [IU] | Freq: Once | INTRAVENOUS | Status: DC | PRN
  Filled 2019-08-24: qty 5

## 2019-08-24 MED ORDER — ATROPINE SULFATE 1 MG/ML IJ SOLN: 0.5000 mg | Freq: Once | INTRAMUSCULAR | Status: DC | PRN

## 2019-08-24 MED ORDER — FLUOROURACIL CHEMO INJECTION 2.5 GM/50ML
400.0000 mg/m2 | Freq: Once | INTRAVENOUS | Status: AC
  Administered 2019-08-24: 950 mg via INTRAVENOUS
  Filled 2019-08-24: qty 19

## 2019-08-24 MED ORDER — METHYLPREDNISOLONE SODIUM SUCC 125 MG IJ SOLR
INTRAMUSCULAR | Status: AC
  Filled 2019-08-24: qty 2

## 2019-08-24 MED ORDER — DEXTROSE 5 % IV SOLN
Freq: Once | INTRAVENOUS | Status: AC
  Filled 2019-08-24: qty 250

## 2019-08-24 MED ORDER — IRINOTECAN HCL CHEMO INJECTION 100 MG/5ML
180.0000 mg/m2 | Freq: Once | INTRAVENOUS | Status: AC
  Administered 2019-08-24: 420 mg via INTRAVENOUS
  Filled 2019-08-24: qty 20

## 2019-08-24 MED ORDER — PALONOSETRON HCL INJECTION 0.25 MG/5ML
0.2500 mg | Freq: Once | INTRAVENOUS | Status: AC
  Administered 2019-08-24: 0.25 mg via INTRAVENOUS

## 2019-08-24 MED ORDER — SODIUM CHLORIDE 0.9% FLUSH
10.0000 mL | INTRAVENOUS | Status: DC | PRN
  Filled 2019-08-24: qty 10

## 2019-08-24 MED ORDER — SODIUM CHLORIDE 0.9 % IV SOLN
Freq: Once | INTRAVENOUS | Status: AC
  Filled 2019-08-24: qty 5

## 2019-08-24 MED ORDER — OXALIPLATIN CHEMO INJECTION 100 MG/20ML
86.0000 mg/m2 | Freq: Once | INTRAVENOUS | Status: AC
  Administered 2019-08-24: 200 mg via INTRAVENOUS
  Filled 2019-08-24: qty 40

## 2019-08-24 MED ORDER — SODIUM CHLORIDE 0.9 % IV SOLN
2400.0000 mg/m2 | INTRAVENOUS | Status: DC
  Administered 2019-08-24: 5550 mg via INTRAVENOUS
  Filled 2019-08-24: qty 111

## 2019-08-24 MED ORDER — PALONOSETRON HCL INJECTION 0.25 MG/5ML
INTRAVENOUS | Status: AC
  Filled 2019-08-24: qty 5

## 2019-08-24 NOTE — Progress Notes (Signed)
Ok to treat with elevated liver enzymes per DR Marin Olp. dph

## 2019-08-24 NOTE — Patient Instructions (Signed)

## 2019-08-24 NOTE — Patient Instructions (Signed)
Irinotecan injection What is this medicine? IRINOTECAN (ir in oh TEE kan ) is a chemotherapy drug. It is used to treat colon and rectal cancer. This medicine may be used for other purposes; ask your health care provider or pharmacist if you have questions. COMMON BRAND NAME(S): Camptosar What should I tell my health care provider before I take this medicine? They need to know if you have any of these conditions:  dehydration  diarrhea  infection (especially a virus infection such as chickenpox, cold sores, or herpes)  liver disease  low blood counts, like low white cell, platelet, or red cell counts  low levels of calcium, magnesium, or potassium in the blood  recent or ongoing radiation therapy  an unusual or allergic reaction to irinotecan, other medicines, foods, dyes, or preservatives  pregnant or trying to get pregnant  breast-feeding How should I use this medicine? This drug is given as an infusion into a vein. It is administered in a hospital or clinic by a specially trained health care professional. Talk to your pediatrician regarding the use of this medicine in children. Special care may be needed. Overdosage: If you think you have taken too much of this medicine contact a poison control center or emergency room at once. NOTE: This medicine is only for you. Do not share this medicine with others. What if I miss a dose? It is important not to miss your dose. Call your doctor or health care professional if you are unable to keep an appointment. What may interact with this medicine? This medicine may interact with the following medications:  antiviral medicines for HIV or AIDS  certain antibiotics like rifampin or rifabutin  certain medicines for fungal infections like itraconazole, ketoconazole, posaconazole, and voriconazole  certain medicines for seizures like carbamazepine, phenobarbital, phenotoin  clarithromycin  gemfibrozil  nefazodone  [facility] [facility]  Wort This list may not describe all possible interactions. Give your health care provider a list of all the medicines, herbs, non-prescription drugs, or dietary supplements you use. Also tell them if you smoke, drink alcohol, or use illegal drugs. Some items may interact with your medicine. What should I watch for while using this medicine? Your condition will be monitored carefully while you are receiving this medicine. You will need important blood work done while you are taking this medicine. This drug may make you feel generally unwell. This is not uncommon, as chemotherapy can affect healthy cells as well as cancer cells. Report any side effects. Continue your course of treatment even though you feel ill unless your doctor tells you to stop. In some cases, you may be given additional medicines to help with side effects. Follow all directions for their use. You may get drowsy or dizzy. Do not drive, use machinery, or do anything that needs mental alertness until you know how this medicine affects you. Do not stand or sit up quickly, especially if you are an older patient. This reduces the risk of dizzy or fainting spells. Call your health care professional for advice if you get a fever, chills, or sore throat, or other symptoms of a cold or flu. Do not treat yourself. This medicine decreases your body's ability to fight infections. Try to avoid being around people who are sick. Avoid taking products that contain aspirin, acetaminophen, ibuprofen, naproxen, or ketoprofen unless instructed by your doctor. These medicines may hide a fever. This medicine may increase your risk to bruise or bleed. Call your doctor or health care professional if you notice  any unusual bleeding. Be careful brushing and flossing your teeth or using a toothpick because you may get an infection or bleed more easily. If you have any dental work done, tell your dentist you are receiving this medicine. Do not become pregnant while  taking this medicine or for 6 months after stopping it. Women should inform their health care professional if they wish to become pregnant or think they might be pregnant. Men should not father a child while taking this medicine and for 3 months after stopping it. There is potential for serious side effects to an unborn child. Talk to your health care professional for more information. Do not breast-feed an infant while taking this medicine or for 7 days after stopping it. This medicine has caused ovarian failure in some women. This medicine may make it more difficult to get pregnant. Talk to your health care professional if you are concerned about your fertility. This medicine has caused decreased sperm counts in some men. This may make it more difficult to father a child. Talk to your health care professional if you are concerned about your fertility. What side effects may I notice from receiving this medicine? Side effects that you should report to your doctor or health care professional as soon as possible:  allergic reactions like skin rash, itching or hives, swelling of the face, lips, or tongue  chest pain  diarrhea  flushing, runny nose, sweating during infusion  low blood counts - this medicine may decrease the number of white blood cells, red blood cells and platelets. You may be at increased risk for infections and bleeding.  nausea, vomiting  pain, swelling, warmth in the leg  signs of decreased platelets or bleeding - bruising, pinpoint red spots on the skin, black, tarry stools, blood in the urine  signs of infection - fever or chills, cough, sore throat, pain or difficulty passing urine  signs of decreased red blood cells - unusually weak or tired, fainting spells, lightheadedness Side effects that usually do not require medical attention (report to your doctor or health care professional if they continue or are bothersome):  constipation  hair loss  headache  loss of  appetite  mouth sores  stomach pain This list may not describe all possible side effects. Call your doctor for medical advice about side effects. You may report side effects to FDA at 1-800-FDA-1088. Where should I keep my medicine? This drug is given in a hospital or clinic and will not be stored at home. NOTE: This sheet is a summary. It may not cover all possible information. If you have questions about this medicine, talk to your doctor, pharmacist, or health care provider.  2020 Elsevier/Gold Standard (2018-10-10 10:09:17) Leucovorin injection What is this medicine? LEUCOVORIN (loo koe VOR in) is used to prevent or treat the harmful effects of some medicines. This medicine is used to treat anemia caused by a low amount of folic acid in the body. It is also used with 5-fluorouracil (5-FU) to treat colon cancer. This medicine may be used for other purposes; ask your health care provider or pharmacist if you have questions. What should I tell my health care provider before I take this medicine? They need to know if you have any of these conditions:  anemia from low levels of vitamin B-12 in the blood  an unusual or allergic reaction to leucovorin, folic acid, other medicines, foods, dyes, or preservatives  pregnant or trying to get pregnant  breast-feeding How should I use  this medicine? This medicine is for injection into a muscle or into a vein. It is given by a health care professional in a hospital or clinic setting. Talk to your pediatrician regarding the use of this medicine in children. Special care may be needed. Overdosage: If you think you have taken too much of this medicine contact a poison control center or emergency room at once. NOTE: This medicine is only for you. Do not share this medicine with others. What if I miss a dose? This does not apply. What may interact with this  medicine?  capecitabine  fluorouracil  phenobarbital  phenytoin  primidone  trimethoprim-sulfamethoxazole This list may not describe all possible interactions. Give your health care provider a list of all the medicines, herbs, non-prescription drugs, or dietary supplements you use. Also tell them if you smoke, drink alcohol, or use illegal drugs. Some items may interact with your medicine. What should I watch for while using this medicine? Your condition will be monitored carefully while you are receiving this medicine. This medicine may increase the side effects of 5-fluorouracil, 5-FU. Tell your doctor or health care professional if you have diarrhea or mouth sores that do not get better or that get worse. What side effects may I notice from receiving this medicine? Side effects that you should report to your doctor or health care professional as soon as possible:  allergic reactions like skin rash, itching or hives, swelling of the face, lips, or tongue  breathing problems  fever, infection  mouth sores  unusual bleeding or bruising  unusually weak or tired Side effects that usually do not require medical attention (report to your doctor or health care professional if they continue or are bothersome):  constipation or diarrhea  loss of appetite  nausea, vomiting This list may not describe all possible side effects. Call your doctor for medical advice about side effects. You may report side effects to FDA at 1-800-FDA-1088. Where should I keep my medicine? This drug is given in a hospital or clinic and will not be stored at home. NOTE: This sheet is a summary. It may not cover all possible information. If you have questions about this medicine, talk to your doctor, pharmacist, or health care provider.  2020 Elsevier/Gold Standard (2008-02-24 16:50:29) Oxaliplatin Injection What is this medicine? OXALIPLATIN (ox AL i PLA tin) is a chemotherapy drug. It targets fast  dividing cells, like cancer cells, and causes these cells to die. This medicine is used to treat cancers of the colon and rectum, and many other cancers. This medicine may be used for other purposes; ask your health care provider or pharmacist if you have questions. COMMON BRAND NAME(S): Eloxatin What should I tell my health care provider before I take this medicine? They need to know if you have any of these conditions:  kidney disease  an unusual or allergic reaction to oxaliplatin, other chemotherapy, other medicines, foods, dyes, or preservatives  pregnant or trying to get pregnant  breast-feeding How should I use this medicine? This drug is given as an infusion into a vein. It is administered in a hospital or clinic by a specially trained health care professional. Talk to your pediatrician regarding the use of this medicine in children. Special care may be needed. Overdosage: If you think you have taken too much of this medicine contact a poison control center or emergency room at once. NOTE: This medicine is only for you. Do not share this medicine with others. What if I miss  a dose? It is important not to miss a dose. Call your doctor or health care professional if you are unable to keep an appointment. What may interact with this medicine?  medicines to increase blood counts like filgrastim, pegfilgrastim, sargramostim  probenecid  some antibiotics like amikacin, gentamicin, neomycin, polymyxin B, streptomycin, tobramycin  zalcitabine Talk to your doctor or health care professional before taking any of these medicines:  acetaminophen  aspirin  ibuprofen  ketoprofen  naproxen This list may not describe all possible interactions. Give your health care provider a list of all the medicines, herbs, non-prescription drugs, or dietary supplements you use. Also tell them if you smoke, drink alcohol, or use illegal drugs. Some items may interact with your medicine. What should  I watch for while using this medicine? Your condition will be monitored carefully while you are receiving this medicine. You will need important blood work done while you are taking this medicine. This medicine can make you more sensitive to cold. Do not drink cold drinks or use ice. Cover exposed skin before coming in contact with cold temperatures or cold objects. When out in cold weather wear warm clothing and cover your mouth and nose to warm the air that goes into your lungs. Tell your doctor if you get sensitive to the cold. This drug may make you feel generally unwell. This is not uncommon, as chemotherapy can affect healthy cells as well as cancer cells. Report any side effects. Continue your course of treatment even though you feel ill unless your doctor tells you to stop. In some cases, you may be given additional medicines to help with side effects. Follow all directions for their use. Call your doctor or health care professional for advice if you get a fever, chills or sore throat, or other symptoms of a cold or flu. Do not treat yourself. This drug decreases your body's ability to fight infections. Try to avoid being around people who are sick. This medicine may increase your risk to bruise or bleed. Call your doctor or health care professional if you notice any unusual bleeding. Be careful brushing and flossing your teeth or using a toothpick because you may get an infection or bleed more easily. If you have any dental work done, tell your dentist you are receiving this medicine. Avoid taking products that contain aspirin, acetaminophen, ibuprofen, naproxen, or ketoprofen unless instructed by your doctor. These medicines may hide a fever. Do not become pregnant while taking this medicine. Women should inform their doctor if they wish to become pregnant or think they might be pregnant. There is a potential for serious side effects to an unborn child. Talk to your health care professional or  pharmacist for more information. Do not breast-feed an infant while taking this medicine. Call your doctor or health care professional if you get diarrhea. Do not treat yourself. What side effects may I notice from receiving this medicine? Side effects that you should report to your doctor or health care professional as soon as possible:  allergic reactions like skin rash, itching or hives, swelling of the face, lips, or tongue  low blood counts - This drug may decrease the number of white blood cells, red blood cells and platelets. You may be at increased risk for infections and bleeding.  signs of infection - fever or chills, cough, sore throat, pain or difficulty passing urine  signs of decreased platelets or bleeding - bruising, pinpoint red spots on the skin, black, tarry stools, nosebleeds  signs  of decreased red blood cells - unusually weak or tired, fainting spells, lightheadedness  breathing problems  chest pain, pressure  cough  diarrhea  jaw tightness  mouth sores  nausea and vomiting  pain, swelling, redness or irritation at the injection site  pain, tingling, numbness in the hands or feet  problems with balance, talking, walking  redness, blistering, peeling or loosening of the skin, including inside the mouth  trouble passing urine or change in the amount of urine Side effects that usually do not require medical attention (report to your doctor or health care professional if they continue or are bothersome):  changes in vision  constipation  hair loss  loss of appetite  metallic taste in the mouth or changes in taste  stomach pain This list may not describe all possible side effects. Call your doctor for medical advice about side effects. You may report side effects to FDA at 1-800-FDA-1088. Where should I keep my medicine? This drug is given in a hospital or clinic and will not be stored at home. NOTE: This sheet is a summary. It may not cover all  possible information. If you have questions about this medicine, talk to your doctor, pharmacist, or health care provider.  2020 Elsevier/Gold Standard (2008-03-16 17:22:47)

## 2019-08-24 NOTE — Progress Notes (Signed)
Hematology and Oncology Follow Up Visit  Jeffrey Harris [id number] 1956/04/30 63 y.o. 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 is back for his follow-up.  He actually tolerated the first cycle of chemotherapy very nicely.  He is having some diarrhea.  He was having some constipation.  He no longer needs the lactulose.  He is really bothered by the percutaneous biliary drainage tube.  His bilirubin is down to 1.9.  He was wondering if might be able to get the tube internalized.  I will see if interventional radiology can do this for Jeffrey Harris.  The fact that his bilirubin is almost back to normal is a good sign.  I have to believe that the CA 19-9 will respond and be lower.  He has lost weight.  This does tell me little bit.  He says that he is eating okay.  He is having no problems with nausea or vomiting.  Maybe, the diarrhea is under better control, he will be able to absorb more nutrients.  He has had no bleeding.  He has had no rashes.  The rash that was on his abdomen is better.  He has had no fever.  He has had no headache.  Overall, I would think that his performance status is ECOG 1.   Medications:  Current Outpatient Medications:  .  allopurinol (ZYLOPRIM) 300 MG tablet, Take 300 mg by mouth daily., Disp: , Rfl:  .  Aspirin-Acetaminophen-Caffeine (EXCEDRIN MIGRAINE PO), Take 1 tablet by mouth daily as needed (migraine)., Disp: , Rfl:  .  dexamethasone (DECADRON) 4 MG tablet, Take 2 tablets (8 mg total) by mouth daily. Start the day after chemotherapy for 3 days. Take with food., Disp: 8 tablet, Rfl: 5 .  Lactulose 20 GM/30ML SOLN, Take 30 mLs (20 g total) by mouth 2 (two) times daily at 10 AM and 5 PM. Please take 2 Tablespoons TWICE a day!!, Disp: 1000 mL, Rfl: 6 .  lidocaine-prilocaine (EMLA) cream, Apply to affected area  once, Disp: 30 g, Rfl: 3 .  lipase/protease/amylase (CREON) 36000 UNITS CPEP capsule, Take 2 capsules (72,000 Units total) by mouth 3 (three) times daily before meals., Disp: 180 capsule, Rfl: 6 .  loperamide (IMODIUM A-D) 2 MG tablet, Take 2 at onset of diarrhea, then 1 every 2hrs until 12hr without a BM. May take 2 tab every 4hrs at bedtime. If diarrhea recurs repeat., Disp: 100 tablet, Rfl: 1 .  LORazepam (ATIVAN) 0.5 MG tablet, Take 1 tablet (0.5 mg total) by mouth every 6 (six) hours as needed for anxiety., Disp: 30 tablet, Rfl: 0 .  MELATONIN PO, Take 1 tablet by mouth daily., Disp: , Rfl:  .  methylPREDNISolone (MEDROL DOSEPAK) 4 MG TBPK tablet, Take as directed, Disp: 21 each, Rfl: 0 .  Multiple Vitamins-Minerals (CENTRUM ADULTS PO), Take 1 tablet by mouth daily., Disp: , Rfl:  .  ondansetron (ZOFRAN) 8 MG tablet, Take 1 tablet (8 mg total) by mouth 2 (two) times daily as needed. Start on day 3 after chemotherapy., Disp: 30 tablet, Rfl: 1 .  oxyCODONE-acetaminophen (PERCOCET/ROXICET) 5-325 MG tablet, Take 1 tablet by mouth every 6 (six) hours as needed for severe pain., Disp: , Rfl:  .  pantoprazole (PROTONIX) 40 MG tablet, Take 1 tablet (40 mg total) by mouth 2 (two) times daily., Disp:  60 tablet, Rfl: 0 .  prochlorperazine (COMPAZINE) 10 MG tablet, Take 1 tablet (10 mg total) by mouth every 6 (six) hours as needed (Nausea or vomiting)., Disp: 30 tablet, Rfl: 1 .  zolpidem (AMBIEN) 5 MG tablet, Take 1 tablet (5 mg total) by mouth at bedtime as needed for sleep., Disp: 30 tablet, Rfl: 1  Allergies: No Known Allergies  Past Medical History, Surgical history, Social history, and Family History were reviewed and updated.  Review of Systems: Review of Systems  Constitutional: Positive for appetite change and unexpected weight change.  HENT:  Negative.   Eyes: Positive for icterus.  Respiratory: Negative.   Cardiovascular: Negative.   Gastrointestinal: Positive for abdominal pain and  constipation.  Endocrine: Negative.   Genitourinary: Negative.    Musculoskeletal: Negative.   Skin: Negative.   Neurological: Negative.   Hematological: Negative.   Psychiatric/Behavioral: Negative.     Physical Exam:  weight is 208 lb (94.3 kg). His temporal temperature is 97.3 F (36.3 C) (abnormal). His blood pressure is 104/78 and his pulse is 95. His respiration is 16 and oxygen saturation is 100%.   Wt Readings from Last 3 Encounters:  08/24/19 208 lb (94.3 kg)  08/10/19 220 lb (99.8 kg)  08/04/19 225 lb (102.1 kg)    Physical Exam Vitals reviewed.  Constitutional:      Comments: Overall he is a well-developed and well-nourished white male.  He does have scleral icterus.  He does have some palatal jaundice.  He is slightly jaundiced with his skin.  HENT:     Head: Normocephalic and atraumatic.  Eyes:     Pupils: Pupils are equal, round, and reactive to light.  Cardiovascular:     Rate and Rhythm: Normal rate and regular rhythm.     Heart sounds: Normal heart sounds.  Pulmonary:     Effort: Pulmonary effort is normal.     Breath sounds: Normal breath sounds.  Abdominal:     General: Bowel sounds are normal.     Palpations: Abdomen is soft.     Comments: His abdomen is soft.  He has the drainage catheter from the liver, not his right side.  He has decent bowel sounds.  There is no guarding or rebound tenderness.  There is no fluid wave.  There is no palpable abdominal mass.  He has no palpable liver or spleen tip.  Musculoskeletal:        General: No tenderness or deformity. Normal range of motion.     Cervical back: Normal range of motion.  Lymphadenopathy:     Cervical: No cervical adenopathy.  Skin:    General: Skin is warm and dry.     Coloration: Skin is jaundiced.     Findings: No erythema or rash.  Neurological:     Mental Status: He is alert and oriented to person, place, and time.  Psychiatric:        Behavior: Behavior normal.        Thought Content:  Thought content normal.        Judgment: Judgment normal.      Lab Results  Component Value Date   WBC 6.3 08/24/2019   HGB 13.4 08/24/2019   HCT 37.3 (L) 08/24/2019   MCV 91.4 08/24/2019   PLT 244 08/24/2019     Chemistry      Component Value Date/Time   NA 126 (L) 08/24/2019 0840   K 3.6 08/24/2019 0840   CL 92 (L) 08/24/2019 0840   CO2  24 08/24/2019 0840   BUN 18 08/24/2019 0840   CREATININE 0.92 08/24/2019 0840      Component Value Date/Time   CALCIUM 9.3 08/24/2019 0840   ALKPHOS 275 (H) 08/24/2019 0840   AST 57 (H) 08/24/2019 0840   ALT 115 (H) 08/24/2019 0840   BILITOT 1.9 (H) 08/24/2019 0840       Impression and Plan: Mr. Hearst is a 63 year old white male.  He has metastatic pancreatic cancer.  He has a very markedly elevated CA 19-9.  I had to believe that he is responding.  Hopefully, the the CA 9-9 is going to be better.  I would still will not do another scan until after the fourth cycle of treatment.  Hopefully, we can get interventional radiology to internalize the biliary stent.  This would make his life a lot easier.  I will plan to have him come back in another couple weeks for his third cycle of treatment.  I am just happy that his quality of life seems to be doing a little bit better and that he is not jaundiced.     Volanda Napoleon, MD 12/21/20209:23 AM

## 2019-08-25 ENCOUNTER — Inpatient Hospital Stay: Payer: BC Managed Care – PPO | Admitting: Nutrition

## 2019-08-25 ENCOUNTER — Encounter: Payer: Self-pay | Admitting: Hematology & Oncology

## 2019-08-25 ENCOUNTER — Encounter: Payer: BC Managed Care – PPO | Admitting: Nutrition

## 2019-08-25 ENCOUNTER — Other Ambulatory Visit: Payer: Self-pay | Admitting: Radiology

## 2019-08-25 LAB — CANCER ANTIGEN 19-9: CA 19-9: 17843 U/mL — ABNORMAL HIGH (ref 0–35)

## 2019-08-25 NOTE — Progress Notes (Signed)
63 year old male diagnosed with metastatic Pancreas cancer diagnosed in November, 2020. He is a patient of Dr. Marin Olp. He is receiving FOLFIRINOX.   PMH includes Gout.  Medications include Decadron, Creon, Immodium AD, Ativan, MVI, Zofran, Protonix, and Compazine  Labs include Na 126, Glucose 207.  Height: 5"11" Weight: 208 pounds Dec 21. Weighed 236 pounds in November. (12% wt loss in one month) BMI: 29.01.  Reports he has been on a low CHO diet. He has only been eating 3 meals daily. Patient has had some diarrhea but reports his stools are now soft and he has 2 daily. He is on 72,000 units Creon 3 times daily. Denies problems swallowing. Reports some nausea but it was resolved with medication. Reports mouth dryness. Has tried weight gainer supplement but does not like it. He is willing to try Ensure.  Nutrition Diagnosis: Unintended weight loss related to new pancreas cancer and associated treatments as evidenced by no prior need for nutrition related information.  Intervention: Educated to consume 3 meals and 3 snacks daily with high calorie and high protein foods. Encouraged no concentrated sweets diet. Reviewed strategies for dry mouth. Encouraged antiemetics as needed to prevent nausea. Provided samples of oral nutrition supplements and offered case of Ensure Enlive which he will pick up tomorrow if he likes Vanilla ensure enlive.  Monitoring, Evaluation, Goals: Patient will tolerate increased calories and protein to minimize further weight loss.  Next Visit:  To be scheduled as needed. Patient has my contact information.

## 2019-08-26 ENCOUNTER — Inpatient Hospital Stay: Payer: BC Managed Care – PPO

## 2019-08-26 ENCOUNTER — Other Ambulatory Visit: Payer: Self-pay

## 2019-08-26 ENCOUNTER — Encounter (HOSPITAL_COMMUNITY): Payer: Self-pay | Admitting: Hematology & Oncology

## 2019-08-26 DIAGNOSIS — C259 Malignant neoplasm of pancreas, unspecified: Secondary | ICD-10-CM

## 2019-08-26 DIAGNOSIS — R17 Unspecified jaundice: Secondary | ICD-10-CM | POA: Diagnosis not present

## 2019-08-26 DIAGNOSIS — Z79899 Other long term (current) drug therapy: Secondary | ICD-10-CM | POA: Diagnosis not present

## 2019-08-26 DIAGNOSIS — C787 Secondary malignant neoplasm of liver and intrahepatic bile duct: Secondary | ICD-10-CM | POA: Diagnosis not present

## 2019-08-26 DIAGNOSIS — R978 Other abnormal tumor markers: Secondary | ICD-10-CM | POA: Diagnosis not present

## 2019-08-26 DIAGNOSIS — G893 Neoplasm related pain (acute) (chronic): Secondary | ICD-10-CM | POA: Diagnosis not present

## 2019-08-26 DIAGNOSIS — R197 Diarrhea, unspecified: Secondary | ICD-10-CM | POA: Diagnosis not present

## 2019-08-26 DIAGNOSIS — C250 Malignant neoplasm of head of pancreas: Secondary | ICD-10-CM | POA: Diagnosis not present

## 2019-08-26 DIAGNOSIS — Z5111 Encounter for antineoplastic chemotherapy: Secondary | ICD-10-CM | POA: Diagnosis not present

## 2019-08-26 DIAGNOSIS — Z938 Other artificial opening status: Secondary | ICD-10-CM | POA: Diagnosis not present

## 2019-08-26 MED ORDER — SODIUM CHLORIDE 0.9% FLUSH
10.0000 mL | INTRAVENOUS | Status: DC | PRN
  Administered 2019-08-26: 10 mL
  Filled 2019-08-26: qty 10

## 2019-08-26 MED ORDER — HEPARIN SOD (PORK) LOCK FLUSH 100 UNIT/ML IV SOLN
500.0000 [IU] | Freq: Once | INTRAVENOUS | Status: AC | PRN
  Administered 2019-08-26: 500 [IU]
  Filled 2019-08-26: qty 5

## 2019-08-26 MED ORDER — PEGFILGRASTIM-JMDB 6 MG/0.6ML ~~LOC~~ SOSY
6.0000 mg | PREFILLED_SYRINGE | Freq: Once | SUBCUTANEOUS | Status: AC
  Administered 2019-08-26: 6 mg via SUBCUTANEOUS

## 2019-08-26 NOTE — Patient Instructions (Signed)

## 2019-08-27 ENCOUNTER — Ambulatory Visit (HOSPITAL_COMMUNITY)
Admission: RE | Admit: 2019-08-27 | Discharge: 2019-08-27 | Disposition: A | Discharge status: Home / Self Care | Payer: BC Managed Care – PPO | Source: Ambulatory Visit | Attending: Diagnostic Radiology | Admitting: Diagnostic Radiology

## 2019-08-27 ENCOUNTER — Other Ambulatory Visit: Payer: Self-pay | Admitting: Interventional Radiology

## 2019-08-27 ENCOUNTER — Other Ambulatory Visit (HOSPITAL_COMMUNITY): Payer: Self-pay | Admitting: Diagnostic Radiology

## 2019-08-27 DIAGNOSIS — C259 Malignant neoplasm of pancreas, unspecified: Secondary | ICD-10-CM | POA: Diagnosis not present

## 2019-08-27 DIAGNOSIS — C787 Secondary malignant neoplasm of liver and intrahepatic bile duct: Secondary | ICD-10-CM

## 2019-08-27 DIAGNOSIS — K831 Obstruction of bile duct: Secondary | ICD-10-CM

## 2019-08-27 DIAGNOSIS — C801 Malignant (primary) neoplasm, unspecified: Secondary | ICD-10-CM

## 2019-08-27 HISTORY — PX: IR BILIARY STENT(S) EXISTING ACCESS INC DILATION CATH EXCHANGE: IMG6048

## 2019-08-27 HISTORY — PX: IR CONVERT BILIARY DRAIN TO INT EXT BILIARY DRAIN: IMG6045

## 2019-08-27 MED ORDER — LIDOCAINE HCL 1 % IJ SOLN
INTRAMUSCULAR | Status: AC
  Filled 2019-08-27: qty 20

## 2019-08-27 MED ORDER — FENTANYL CITRATE (PF) 100 MCG/2ML IJ SOLN
INTRAMUSCULAR | Status: AC
  Filled 2019-08-27: qty 2

## 2019-08-27 MED ORDER — MIDAZOLAM HCL 2 MG/2ML IJ SOLN
INTRAMUSCULAR | Status: AC
  Filled 2019-08-27: qty 2

## 2019-08-27 MED ORDER — IOHEXOL 300 MG/ML  SOLN
50.0000 mL | Freq: Once | INTRAMUSCULAR | Status: AC | PRN
  Administered 2019-08-27: 20 mL

## 2019-08-27 MED ORDER — PIPERACILLIN-TAZOBACTAM 3.375 G IVPB
3.3750 g | INTRAVENOUS | Status: AC
  Administered 2019-08-27: 3.375 g via INTRAVENOUS
  Filled 2019-08-27 (×2): qty 50

## 2019-08-27 MED ORDER — FENTANYL CITRATE (PF) 100 MCG/2ML IJ SOLN
INTRAMUSCULAR | Status: AC | PRN
  Administered 2019-08-27: 25 ug via INTRAVENOUS
  Administered 2019-08-27: 50 ug via INTRAVENOUS

## 2019-08-27 MED ORDER — LIDOCAINE HCL 1 % IJ SOLN
INTRAMUSCULAR | Status: AC | PRN
  Administered 2019-08-27: 10 mL

## 2019-08-27 MED ORDER — HYDROCODONE-ACETAMINOPHEN 5-325 MG PO TABS: 1.0000 | ORAL_TABLET | ORAL | Status: DC | PRN

## 2019-08-27 MED ORDER — SODIUM CHLORIDE 0.9 % IV BOLUS
INTRAVENOUS | Status: AC | PRN
  Administered 2019-08-27: 500 mL via INTRAVENOUS

## 2019-08-27 MED ORDER — MIDAZOLAM HCL 2 MG/2ML IJ SOLN
INTRAMUSCULAR | Status: AC | PRN
  Administered 2019-08-27: 1 mg via INTRAVENOUS

## 2019-08-27 MED ORDER — SODIUM CHLORIDE 0.9% FLUSH: 5.0000 mL | Freq: Three times a day (TID) | INTRAVENOUS | Status: DC

## 2019-08-27 NOTE — H&P (Signed)
Chief Complaint: Patient was seen in consultation today for biliary stent  Supervising Physician: Arne Cleveland  Patient Status: [facility] - Out-pt  History of Present Illness: Jeffrey Harris is a 63 y.o. male with obstructive jaundice from pancreatic mass/cancer. He underwent PTC with I/E drain on 11/22 and has done pretty well. He's had port placed in the interim. He had cholangiogram on 12/2 as well that showed the biliary system to be well decompressed and his bilirubin level has dropped to 1.9 He is scheduled today for cholangiogram with plan to internalize to biliary stent and exchange of external biliary drain for capping trial.  Past Medical History:  Diagnosis Date  . Goals of care, counseling/discussion 07/29/2019  . Gout   . Hyperbilirubinemia 07/2019  . Pancreatic cancer metastasized to liver [facility]) 07/29/2019    Past Surgical History:  Procedure Laterality Date  . BIOPSY  07/25/2019   Procedure: BIOPSY;  Surgeon: Ronnette Juniper, MD;  Location: [facility] ENDOSCOPY;  Service: Gastroenterology;;  . ESOPHAGOGASTRODUODENOSCOPY  07/25/2019   Procedure: ESOPHAGOGASTRODUODENOSCOPY (EGD);  Surgeon: Ronnette Juniper, MD;  Location: [place];  Service: Gastroenterology;;  . IR IMAGING GUIDED PORT INSERTION  08/06/2019  . IR INT EXT BILIARY DRAIN WITH CHOLANGIOGRAM  07/26/2019  . IR RADIOLOGIST EVAL & MGMT  08/05/2019  . KNEE ARTHROSCOPY      Allergies: Patient has no known allergies.  Medications: Prior to Admission medications   Medication Sig Start Date End Date Taking? Authorizing Provider  allopurinol (ZYLOPRIM) 300 MG tablet Take 300 mg by mouth daily.   Yes [provider]  Lactulose 20 GM/30ML SOLN Take 30 mLs (20 g total) by mouth 2 (two) times daily at 10 AM and 5 PM. Please take 2 Tablespoons TWICE a day!! 08/04/19  Yes Ennever, Rudell Cobb, MD  lipase/protease/amylase (CREON) 36000 UNITS CPEP capsule Take 2 capsules (72,000 Units total) by mouth 3 (three) times daily  before meals. 08/04/19  Yes Ennever, Rudell Cobb, MD  LORazepam (ATIVAN) 0.5 MG tablet Take 1 tablet (0.5 mg total) by mouth every 6 (six) hours as needed for anxiety. 08/07/19  Yes Volanda Napoleon, MD  MELATONIN PO Take 1 tablet by mouth daily.   Yes [provider]  methylPREDNISolone (MEDROL DOSEPAK) 4 MG TBPK tablet Take as directed 08/06/19  Yes Ennever, Rudell Cobb, MD  ondansetron (ZOFRAN) 8 MG tablet Take 1 tablet (8 mg total) by mouth 2 (two) times daily as needed. Start on day 3 after chemotherapy. 08/07/19  Yes Volanda Napoleon, MD  oxyCODONE-acetaminophen (PERCOCET/ROXICET) 5-325 MG tablet Take 1 tablet by mouth every 6 (six) hours as needed for severe pain.   Yes [provider]  pantoprazole (PROTONIX) 40 MG tablet Take 1 tablet (40 mg total) by mouth 2 (two) times daily. 07/29/19  Yes Mikhail, Velta Addison, DO  prochlorperazine (COMPAZINE) 10 MG tablet Take 1 tablet (10 mg total) by mouth every 6 (six) hours as needed (Nausea or vomiting). 08/07/19  Yes Volanda Napoleon, MD  zolpidem (AMBIEN) 5 MG tablet Take 1 tablet (5 mg total) by mouth at bedtime as needed for sleep. 08/17/19  Yes Ennever, Rudell Cobb, MD  Aspirin-Acetaminophen-Caffeine (EXCEDRIN MIGRAINE PO) Take 1 tablet by mouth daily as needed (migraine).    [provider]  dexamethasone (DECADRON) 4 MG tablet Take 2 tablets (8 mg total) by mouth daily. Start the day after chemotherapy for 3 days. Take with food. 08/07/19   Volanda Napoleon, MD  lidocaine-prilocaine (EMLA) cream Apply to affected area  once 08/07/19   Volanda Napoleon, MD  loperamide (IMODIUM A-D) 2 MG tablet Take 2 at onset of diarrhea, then 1 every 2hrs until 12hr without a BM. May take 2 tab every 4hrs at bedtime. If diarrhea recurs repeat. 08/03/19   Volanda Napoleon, MD  Multiple Vitamins-Minerals (CENTRUM ADULTS PO) Take 1 tablet by mouth daily.    [provider]     Family History  Problem Relation Age of Onset  . Diabetes Father      Social History   Socioeconomic History  . Marital status: Married    Spouse name: Not on file  . Number of children: Not on file  . Years of education: Not on file  . Highest education level: Not on file  Occupational History  . Not on file  Tobacco Use  . Smoking status: Never Smoker  . Smokeless tobacco: Former Systems developer    Types: Snuff  Substance and Sexual Activity  . Alcohol use: No  . Drug use: No  . Sexual activity: Not Currently  Other Topics Concern  . Not on file  Social History Narrative  . Not on file   Social Determinants of Health   Financial Resource Strain:   . Difficulty of Paying Living Expenses: Not on file  Food Insecurity:   . Worried About Charity fundraiser in the Last Year: Not on file  . Ran Out of Food in the Last Year: Not on file  Transportation Needs:   . Lack of Transportation (Medical): Not on file  . Lack of Transportation (Non-Medical): Not on file  Physical Activity:   . Days of Exercise per Week: Not on file  . Minutes of Exercise per Session: Not on file  Stress:   . Feeling of Stress : Not on file  Social Connections:   . Frequency of Communication with Friends and Family: Not on file  . Frequency of Social Gatherings with Friends and Family: Not on file  . Attends Religious Services: Not on file  . Active Member of Clubs or Organizations: Not on file  . Attends Archivist Meetings: Not on file  . Marital Status: Not on file     Review of Systems: A 12 point ROS discussed and pertinent positives are indicated in the HPI above.  All other systems are negative.  Review of Systems  Vital Signs: BP 110/75   Pulse 93   Temp (!) 96.3 F (35.7 C) (Temporal)   Ht 5' 11" (1.803 m)   Wt 94.3 kg   SpO2 99%   BMI 29.01 kg/m   Physical Exam Constitutional:      Appearance: Normal appearance. He is not ill-appearing.  Cardiovascular:     Rate and Rhythm: Normal rate and regular rhythm.     Heart sounds: Normal  heart sounds.  Pulmonary:     Effort: Pulmonary effort is normal. No respiratory distress.     Breath sounds: Normal breath sounds.  Abdominal:     General: Abdomen is flat.     Palpations: Abdomen is soft.     Comments: RUQ drain intact, site clean. Bilious output in bag  Skin:    General: Skin is warm and dry.     Coloration: Skin is not jaundiced.     Comments: (R)chest port palpable, incision healing well  Neurological:     General: No focal deficit present.     Mental Status: He is alert and oriented to person, place, and time.  Imaging: CT ABDOMEN PELVIS W CONTRAST  Result Date: 07/30/2019 CLINICAL DATA:  Generalized abdominal pain, history of pancreatic carcinoma with metastatic liver disease and biliary drainage catheter EXAM: CT ABDOMEN AND PELVIS WITH CONTRAST TECHNIQUE: Multidetector CT imaging of the abdomen and pelvis was performed using the standard protocol following bolus administration of intravenous contrast. CONTRAST:  121m OMNIPAQUE IOHEXOL 300 MG/ML  SOLN COMPARISON:  07/23/2019 FINDINGS: Lower chest: Bibasilar atelectatic changes are noted. These are new from the prior exam. Hepatobiliary: Liver again demonstrates multiple hypodense lesions consistent with metastatic disease. A biliary drainage catheter is identified entering the right lobe and extending into the duodenum stable in appearance from prior examination from 07/26/2019. Gallbladder demonstrates air within consistent with the biliary drainage tube. No definitive cholelithiasis is seen. The gallbladder is predominately decompressed. Pancreas: Pancreas is well visualized. The known pancreatic head mass is stable in appearance. The remainder of the pancreas is unremarkable. Spleen: Normal in size without focal abnormality. Adrenals/Urinary Tract: Right adrenal gland is within normal limits. Left adrenal lesions are again noted and stable. 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 The kidneys show no  evidence of renal calculi or urinary tract obstructive changes. Right kidney is somewhat ptotic within the pelvis. Malrotation defect is noted without obstructive change. Bladder is well distended. Stomach/Bowel: The appendix is within normal limits. Colon shows no obstructive or inflammatory changes. Small bowel and stomach are unremarkable. Vascular/Lymphatic: Aortic atherosclerotic changes noted. Small peripancreatic lymph nodes are again seen and stable. Small lymph node is noted adjacent to the GE junction. These are stable from the recent exam. Reproductive: Prostate is unremarkable. Other: No abdominal wall hernia or abnormality. No abdominopelvic ascites. Musculoskeletal: Degenerative changes of lumbar spine are noted. IMPRESSION: Changes consistent with the known pancreatic mass with liver metastatic disease and lymphadenopathy are noted. New biliary drainage catheter is noted in satisfactory position. No perihepatic fluid collection is seen. New bibasilar atelectatic changes. No other focal abnormality is noted. Electronically Signed   By: MInez CatalinaM.D.   On: 07/30/2019 23:39   DG CHOLANGIOGRAM  EXISTING TUBE  Result Date: 08/05/2019 INDICATION: 63year old with pancreatic cancer and biliary drain. EXAM: CHOLANGIOGRAM THROUGH EXISTING CATHETER MEDICATIONS: None ANESTHESIA/SEDATION: None FLUOROSCOPY TIME:  Fluoroscopy Time: 48 seconds, 9 3 mGy COMPLICATIONS: None immediate. PROCEDURE: Biliary drain was injected with contrast under fluoroscopic guidance. Catheter was flushed with saline and attached to the gravity bag at the end of the procedure. FINDINGS: Stable position of the biliary drain from the placement images. The distal aspect of drain in the region of the duodenum. Contrast fills the intrahepatic biliary system and the common hepatic duct. There is mild dilatation of the central intrahepatic bile ducts. No significant contrast in the common bile duct or the distal aspect of the drain. No  significant contrast draining into the duodenum. Drain was attached to the gravity back and follow-up images were obtained with the patient in a semi upright position. The biliary system was adequately decompressed after the drain was re-attached to the gravity bag. Filling of the cystic duct and gallbladder. IMPRESSION: Stable position of the internal/external biliary drain. Biliary system is being adequately decompressed through the gravity bag. No significant contrast in the distal common bile duct and there is no significant contrast in the distal aspect of the stent or duodenum. Due to the lack of internal drainage, will continue external drainage through the gravity bag. Electronically Signed   By: AMarkus DaftM.D.   On: 08/05/2019 17:24   IR IMAGING GUIDED  PORT INSERTION  Result Date: 08/06/2019 INDICATION: 63 year old with metastatic pancreatic cancer and Port-A-Cath needed for therapy. EXAM: FLUOROSCOPIC AND ULTRASOUND GUIDED PLACEMENT OF A SUBCUTANEOUS PORT COMPARISON:  None. MEDICATIONS: Ancef 2 g; The antibiotic was administered within an appropriate time interval prior to skin puncture. ANESTHESIA/SEDATION: Versed 4.0 mg IV; Fentanyl 100 mcg IV; Moderate Sedation Time:  33 minutes The patient was continuously monitored during the procedure by the interventional radiology nurse under my direct supervision. FLUOROSCOPY TIME:  1 minutes, 18 seconds, 24 mGy COMPLICATIONS: None immediate. PROCEDURE: The procedure, risks, benefits, and alternatives were explained to the patient. Questions regarding the procedure were encouraged and answered. The patient understands and consents to the procedure. Patient was placed supine on the interventional table. Ultrasound confirmed a patent right internal jugular vein. Ultrasound image was saved for documentation. The right chest and neck were cleaned with a skin antiseptic and a sterile drape was placed. Maximal barrier sterile technique was utilized including caps,  mask, sterile gowns, sterile gloves, sterile drape, hand hygiene and skin antiseptic. The right neck was anesthetized with 1% lidocaine. Small incision was made in the right neck with a blade. Micropuncture set was placed in the right internal jugular vein with ultrasound guidance. The micropuncture wire was used for measurement purposes. The right chest was anesthetized with 1% lidocaine with epinephrine. #15 blade was used to make an incision and a subcutaneous port pocket was formed. Gering was assembled. Subcutaneous tunnel was formed with a stiff tunneling device. The port catheter was brought through the subcutaneous tunnel. The port was placed in the subcutaneous pocket. The micropuncture set was exchanged for a peel-away sheath. The catheter was placed through the peel-away sheath and the tip was positioned at the SVC/right atrium junction. Catheter placement was confirmed with fluoroscopy. The port was accessed and flushed with heparinized saline. The port pocket was closed using two layers of absorbable sutures and Dermabond. The vein skin site was closed using a single layer of absorbable suture and Dermabond. Sterile dressings were applied. Patient tolerated the procedure well without an immediate complication. Ultrasound and fluoroscopic images were taken and saved for this procedure. IMPRESSION: Placement of a subcutaneous port device. Catheter tip at the SVC and right atrium junction. Electronically Signed   By: Markus Daft M.D.   On: 08/06/2019 12:26   IR Radiologist Eval & Mgmt  Result Date: 08/05/2019 Please refer to notes tab for details about interventional procedure. (Op Note)   Labs:  CBC: Recent Labs    08/04/19 0939 08/06/19 1015 08/10/19 0850 08/24/19 0840  WBC 10.6* 7.0 9.9 6.3  HGB 13.7 14.1 13.5 13.4  HCT 37.8* 39.6 38.3* 37.3*  PLT 418* 398 405* 244    COAGS: Recent Labs    07/23/19 1100 07/25/19 0231 07/26/19 0250 08/06/19 1015  INR 1.1 1.2 1.2  1.2    BMP: Recent Labs    07/30/19 2017 08/04/19 0939 08/10/19 0850 08/24/19 0840  NA 129* 128* 133* 126*  K 3.4* 4.3 3.3* 3.6  CL 96* 90* 102 92*  CO2 _0 GLUCOSE 211* 213* 189* 207*  BUN _1 CALCIUM 8.8* 9.8 7.7* 9.3  CREATININE 0.99 0.96 0.58* 0.92  GFRNONAA >60 >60 >60 >60  GFRAA >60 >60 >60 >60    LIVER FUNCTION TESTS: Recent Labs    07/30/19 2017 08/04/19 0939 08/10/19 0850 08/24/19 0840  BILITOT 6.4* 7.8* 3.4* 1.9*  AST 70* 64* 69* 57*  ALT 95*  79* 122* 115*  ALKPHOS 197* 205* 206* 275*  PROT 7.4 8.3* 6.5 7.2  ALBUMIN 3.3* 4.3 3.7 4.2    TUMOR MARKERS: No results for input(s): AFPTM, CEA, CA199, CHROMGRNA in the last 8760 hours.  Assessment and Plan: Obstructive jaundice from pancreatic mass/cancer S/p PTC Bili drain 11/22 Plan for attempt at internal biliary stent. Will then place external bili drain but leave capped. Risks and benefits of cholangiogram with biliary drain exchange/stent discussed with the patient including, but not limited to bleeding, infection which may lead to sepsis or even death and damage to adjacent structures.  This interventional procedure involves the use of X-rays and because of the nature of the planned procedure, it is possible that we will have prolonged use of X-ray fluoroscopy.  Potential radiation risks to you include (but are not limited to) the following: - A slightly elevated risk for cancer  several years later in life. This risk is typically less than 0.5% percent. This risk is low in comparison to the normal incidence of human cancer, which is 33% for women and 50% for men according to the [place]. - Radiation induced injury can include skin redness, resembling a rash, tissue breakdown / ulcers and hair loss (which can be temporary or permanent).   The likelihood of either of these occurring depends on the difficulty of the procedure and whether you are sensitive to radiation due  to previous procedures, disease, or genetic conditions.   IF your procedure requires a prolonged use of radiation, you will be notified and given written instructions for further action.  It is your responsibility to monitor the irradiated area for the 2 weeks following the procedure and to notify your physician if you are concerned that you have suffered a radiation induced injury.    All of the patient's questions were answered, patient is agreeable to proceed.  Consent signed and in chart.    Thank you for this interesting consult.  I greatly enjoyed meeting Jeffrey Harris and look forward to participating in their care.  A copy of this report was sent to the requesting provider on this date.  Electronically Signed: Ascencion Dike, PA-C 08/27/2019, 12:37 PM   I spent a total of 20 minutes in face to face in clinical consultation, greater than 50% of which was counseling/coordinating care for bili drain exchange/stent

## 2019-08-27 NOTE — Discharge Instructions (Signed)
Biliary Drainage Catheter Home Guide A biliary drainage catheter is a thin, flexible tube that is inserted through your skin into the bile ducts in your liver. Bile is a thick yellow or green fluid that helps digest fat in foods. The purpose of a biliary drainage catheter is to keep bile from backing up into your liver. Backup of bile can occur when there is a blockage that prevents bile from moving from the bile ducts into the small intestine as it should. The blockage can be caused by gallstones, a tumor, or scar tissue. There are three types of biliary drainage:  External biliary drainage. With this type, bile is only drained into a collection bag outside your body (external collection bag).  Internal-external biliary drainage. With this type, bile is drained to an external collection bag as well as into your small intestine.  Internal biliary drainage. With this type, bile is only drained into your small intestine. General home care includes these daily actions:   Inspection of your drainage catheter.  Flushing your drainage catheter with saline.  Emptying drainage from the collection bag (if present).  Recording the amount of drainage.  Checking the catheter insertion site for signs of infection. Check for: ? Redness, swelling, or pain. ? Fluid or blood. ? Warmth. ? Pus or a bad smell. How do I inspect my drainage catheter?  Check the dressing to make sure that it is dry and clean.  Look at the skin around the drainage catheter when changing the dressing for any problems such as redness, rash, or skin breakdown.  Check the drainage bag to make sure that drainage fluid is flowing into the bag well. Note the color and amount compared to other days.  Check the drainage catheter and bag for any cracks or kinks in the tubing. How do I change my dressing? The dressing over the drainage catheter should be changed every other day, or more often if needed to keep the dressing dry. Your  health care provider will instruct you about how often to change your dressing. Supplies needed:  Mild soap and warm water.  Split gauze pads, 4 x 4 inches (10 x 10 cm) to use as a dressing sponge.  Gauze pads, 4 x 4 inches (10 x 10 cm) or adhesive dressing cover.  Paper tape. How to change the dressing: 1. Wash your hands with soap and water. 2. Gently remove the old dressing. Avoid using scissors to remove the dressing because they may damage the drainage catheter. 3. Wash the skin around the insertion site with mild soap and warm water, rinse well, then pat the area dry with a clean cloth. 4. Check the skin around the drainage catheter for redness or swelling, or for yellow or green discharge that has a bad smell. 5. If the drainage catheter was stitched (sutured) to the skin, inspect the suture to make sure it is still anchored in the skin. 6. Do not apply creams, ointments, or alcohol to the site. Allow the skin to air-dry completely before you apply a new dressing. 7. Place the drainage catheter through the slit in a dressing sponge. The dressing sponge should slide under the disk that holds the drainage catheter in place. 8. Cover the drainage catheter and the dressing sponge with a 4 x 4 inch (10 x 10 cm) gauze. The drainage catheter should rest on the gauze and not on the skin. 9. Tape the dressing to the skin. 10. You may be instructed to use an  adhesive dressing covering over the top of this in place of the gauze and tape. °11. Wash your hands with soap and water. °How do I flush my drainage catheter? °Biliary drainagecatheters should be flushed daily, or as often as told by your health care provider. The end of the drainage catheter is closed using an IV cap. A syringe can be directly connected to the IV cap. °Supplies needed: °· Alcohol swab. °· 10 mL prefilled normal saline syringe. °How to flush the drainage catheter: °1. Wash your hands with soap and water. °2. If your drainage  catheter has a stopcock attached to it, turn the stopcock toward the drainage bag. This will allow the saline to flow in the direction of your body. °3. Clean the IV cap with an alcohol swab. °4. Screw the tip of a 10 mL normal saline syringe onto the IV cap. °5. Inject the saline over 5-10 seconds. If you feel resistance while injecting, stop immediately. Avoid  pulling back on the plunger. Doing that could increase your risk of infection. °6. Remove the syringe from the cap. Turn the stopcock so that fluid flows from your body into the drainage bag. You may notice more fluid flowing into the bag after you have completed the flush. °How do I attach a bag to my drainage catheter? °If you are having trouble with your internal biliary drain, you may be directed by your health care provider to use bag drainage until you can be seen to fix the problem. For this reason, you should always have a collection bag and connecting tubing at home. If you do not have these supplies, remember to ask for them at your next appointment. °1. Remove the bag and the connecting tubing from their packaging. °2. Connect the funnel end of the tubing to the bag's cone-shaped stem. °3. Remove the IV cap from the biliary drain. To do this, unscrew it and replace it with the screw-on end of the tubing. °4. Save the IV cap in a plastic storage bag that can be sealed. °How do I empty my collection bag? °Empty the collection bag whenever it becomes 2/3 full. Also empty it before you go to sleep. Most collection bags have a drainage valve at the bottom so the bag can be that allows them to be emptied easily. °1. Wash your hands with soap and water. °2. Hold the collection bag over the toilet, basin, or collection container. Use a measuring container if your health care provider told you to measure the drainage. °3. Unscrew the valve to open it, and allow the bag to drain. °4. Close the valve securely to avoid leakage. °5. Use a tissue or disposable  napkin to wipe the valve clean. °6. Wash the measuring container with soap and water. °7. Record the amount of drainage as told by your health care provider. °Contact a health care provider if: °· Your pain gets worse after it had improved, and it is not relieved with pain medicines. °· You have any questions about caring for your drainage catheter or collection bag. °· You have any of these around your catheter insertion site or coming from it: °? Skin breakdown. °? Redness, swelling, or pain. °? Fluid or blood. °? Warmth to the touch. °? Pus or a bad smell. °Get help right away if: °· You have a fever or chills. °· Your redness, swelling, or pain at the catheter insertion site gets worse, even though you are cleaning it well. °· You have leakage of   bile around the drainage catheter. °· Your drainage catheter becomes blocked or clogged. °· Your drainage catheter comes out. °This information is not intended to replace advice given to you by your health care provider. Make sure you discuss any questions you have with your health care provider. °Document Released: 06/10/2013 Document Revised: 08/02/2017 Document Reviewed: 07/09/2016 °Elsevier Patient Education © 2020 [employer] ° °

## 2019-08-27 NOTE — Progress Notes (Signed)
Pt also given an extra drain bag,  NS flushes and dressings

## 2019-08-27 NOTE — Procedures (Signed)
  Procedure: Cholangiogram, Biliary stent placement, exchange int/ext drain left capped EBL:   minimal Complications:  none immediate  See full dictation in [facility].  Dillard Cannon MD Main # [phone] Pager  [phone]

## 2019-08-31 ENCOUNTER — Encounter: Payer: Self-pay | Admitting: Hematology & Oncology

## 2019-08-31 ENCOUNTER — Other Ambulatory Visit (HOSPITAL_COMMUNITY): Payer: Self-pay | Admitting: Diagnostic Radiology

## 2019-08-31 ENCOUNTER — Encounter (HOSPITAL_COMMUNITY): Payer: Self-pay

## 2019-08-31 DIAGNOSIS — C787 Secondary malignant neoplasm of liver and intrahepatic bile duct: Secondary | ICD-10-CM

## 2019-08-31 DIAGNOSIS — C259 Malignant neoplasm of pancreas, unspecified: Secondary | ICD-10-CM

## 2019-09-01 ENCOUNTER — Other Ambulatory Visit (HOSPITAL_COMMUNITY): Payer: Self-pay | Admitting: Interventional Radiology

## 2019-09-01 ENCOUNTER — Ambulatory Visit: Payer: BC Managed Care – PPO | Admitting: Hematology & Oncology

## 2019-09-01 ENCOUNTER — Other Ambulatory Visit: Payer: BC Managed Care – PPO

## 2019-09-01 ENCOUNTER — Other Ambulatory Visit: Payer: Self-pay

## 2019-09-01 ENCOUNTER — Ambulatory Visit: Payer: BC Managed Care – PPO

## 2019-09-01 ENCOUNTER — Ambulatory Visit (HOSPITAL_COMMUNITY)
Admission: RE | Admit: 2019-09-01 | Discharge: 2019-09-01 | Disposition: A | Discharge status: Home / Self Care | Payer: BC Managed Care – PPO | Source: Ambulatory Visit | Attending: Interventional Radiology | Admitting: Interventional Radiology

## 2019-09-01 ENCOUNTER — Other Ambulatory Visit (HOSPITAL_COMMUNITY): Payer: BC Managed Care – PPO

## 2019-09-01 DIAGNOSIS — K831 Obstruction of bile duct: Secondary | ICD-10-CM | POA: Insufficient documentation

## 2019-09-01 DIAGNOSIS — Z4803 Encounter for change or removal of drains: Secondary | ICD-10-CM | POA: Diagnosis not present

## 2019-09-01 DIAGNOSIS — C259 Malignant neoplasm of pancreas, unspecified: Secondary | ICD-10-CM | POA: Diagnosis not present

## 2019-09-01 DIAGNOSIS — C801 Malignant (primary) neoplasm, unspecified: Secondary | ICD-10-CM

## 2019-09-01 HISTORY — PX: IR CHOLANGIOGRAM EXISTING TUBE: IMG6040

## 2019-09-01 HISTORY — PX: IR REMOVAL BILIARY DRAIN: IMG6047

## 2019-09-01 MED ORDER — IOHEXOL 300 MG/ML  SOLN
50.0000 mL | Freq: Once | INTRAMUSCULAR | Status: AC | PRN
  Administered 2019-09-01: 10 mL

## 2019-09-01 NOTE — Procedures (Signed)
panc ca  S/p biliary drain cholangiogram and removal  Internal CBD stent is patent  No comp Stable ebl 0 Full report in [facility]

## 2019-09-03 ENCOUNTER — Telehealth: Payer: Self-pay

## 2019-09-03 NOTE — Telephone Encounter (Signed)
Late note:  [facility] liquid biopsy sent via [employer] on 08/26/2019. dph

## 2019-09-07 ENCOUNTER — Other Ambulatory Visit: Payer: Self-pay

## 2019-09-07 ENCOUNTER — Encounter: Payer: Self-pay | Admitting: Hematology & Oncology

## 2019-09-07 ENCOUNTER — Inpatient Hospital Stay: Payer: BC Managed Care – PPO

## 2019-09-07 ENCOUNTER — Inpatient Hospital Stay (HOSPITAL_BASED_OUTPATIENT_CLINIC_OR_DEPARTMENT_OTHER): Payer: BC Managed Care – PPO | Admitting: Hematology & Oncology

## 2019-09-07 ENCOUNTER — Inpatient Hospital Stay: Payer: BC Managed Care – PPO | Attending: Hematology & Oncology

## 2019-09-07 ENCOUNTER — Other Ambulatory Visit (HOSPITAL_COMMUNITY): Payer: Self-pay | Admitting: Physician Assistant

## 2019-09-07 ENCOUNTER — Other Ambulatory Visit: Payer: Self-pay | Admitting: *Deleted

## 2019-09-07 ENCOUNTER — Telehealth: Payer: Self-pay | Admitting: Hematology & Oncology

## 2019-09-07 ENCOUNTER — Telehealth: Payer: Self-pay | Admitting: Physician Assistant

## 2019-09-07 VITALS — BP 91/73 | HR 109 | Temp 98.2°F | Resp 16 | Wt 210.0 lb

## 2019-09-07 DIAGNOSIS — Z7689 Persons encountering health services in other specified circumstances: Secondary | ICD-10-CM | POA: Diagnosis not present

## 2019-09-07 DIAGNOSIS — C787 Secondary malignant neoplasm of liver and intrahepatic bile duct: Secondary | ICD-10-CM | POA: Diagnosis not present

## 2019-09-07 DIAGNOSIS — R970 Elevated carcinoembryonic antigen [CEA]: Secondary | ICD-10-CM | POA: Diagnosis not present

## 2019-09-07 DIAGNOSIS — Z79899 Other long term (current) drug therapy: Secondary | ICD-10-CM | POA: Insufficient documentation

## 2019-09-07 DIAGNOSIS — T8579XA Infection and inflammatory reaction due to other internal prosthetic devices, implants and grafts, initial encounter: Secondary | ICD-10-CM | POA: Insufficient documentation

## 2019-09-07 DIAGNOSIS — T8149XA Infection following a procedure, other surgical site, initial encounter: Secondary | ICD-10-CM

## 2019-09-07 DIAGNOSIS — C259 Malignant neoplasm of pancreas, unspecified: Secondary | ICD-10-CM

## 2019-09-07 DIAGNOSIS — C250 Malignant neoplasm of head of pancreas: Secondary | ICD-10-CM | POA: Insufficient documentation

## 2019-09-07 DIAGNOSIS — K5900 Constipation, unspecified: Secondary | ICD-10-CM | POA: Diagnosis not present

## 2019-09-07 DIAGNOSIS — R739 Hyperglycemia, unspecified: Secondary | ICD-10-CM | POA: Insufficient documentation

## 2019-09-07 DIAGNOSIS — E860 Dehydration: Secondary | ICD-10-CM | POA: Insufficient documentation

## 2019-09-07 DIAGNOSIS — G893 Neoplasm related pain (acute) (chronic): Secondary | ICD-10-CM | POA: Insufficient documentation

## 2019-09-07 DIAGNOSIS — K831 Obstruction of bile duct: Secondary | ICD-10-CM

## 2019-09-07 DIAGNOSIS — Z5111 Encounter for antineoplastic chemotherapy: Secondary | ICD-10-CM | POA: Insufficient documentation

## 2019-09-07 LAB — CBC WITH DIFFERENTIAL (CANCER CENTER ONLY)
Abs Immature Granulocytes: 0.26 10*3/uL — ABNORMAL HIGH (ref 0.00–0.07)
Basophils Absolute: 0.1 10*3/uL (ref 0.0–0.1)
Basophils Relative: 1 %
Eosinophils Absolute: 0 10*3/uL (ref 0.0–0.5)
Eosinophils Relative: 0 %
HCT: 30.3 % — ABNORMAL LOW (ref 39.0–52.0)
Hemoglobin: 10.7 g/dL — ABNORMAL LOW (ref 13.0–17.0)
Immature Granulocytes: 2 %
Lymphocytes Relative: 10 %
Lymphs Abs: 1.1 10*3/uL (ref 0.7–4.0)
MCH: 32.8 pg (ref 26.0–34.0)
MCHC: 35.3 g/dL (ref 30.0–36.0)
MCV: 92.9 fL (ref 80.0–100.0)
Monocytes Absolute: 0.9 10*3/uL (ref 0.1–1.0)
Monocytes Relative: 8 %
Neutro Abs: 8.5 10*3/uL — ABNORMAL HIGH (ref 1.7–7.7)
Neutrophils Relative %: 79 %
Platelet Count: 219 10*3/uL (ref 150–400)
RBC: 3.26 MIL/uL — ABNORMAL LOW (ref 4.22–5.81)
RDW: 12 % (ref 11.5–15.5)
WBC Count: 10.8 10*3/uL — ABNORMAL HIGH (ref 4.0–10.5)
nRBC: 0 % (ref 0.0–0.2)

## 2019-09-07 LAB — CMP (CANCER CENTER ONLY)
ALT: 23 U/L (ref 0–44)
AST: 18 U/L (ref 15–41)
Albumin: 3.3 g/dL — ABNORMAL LOW (ref 3.5–5.0)
Alkaline Phosphatase: 188 U/L — ABNORMAL HIGH (ref 38–126)
Anion gap: 9 (ref 5–15)
BUN: 6 mg/dL — ABNORMAL LOW (ref 8–23)
CO2: 30 mmol/L (ref 22–32)
Calcium: 8.5 mg/dL — ABNORMAL LOW (ref 8.9–10.3)
Chloride: 88 mmol/L — ABNORMAL LOW (ref 98–111)
Creatinine: 0.72 mg/dL (ref 0.61–1.24)
GFR, Est AFR Am: >60 mL/min (ref >60)
GFR, Estimated: >60 mL/min (ref >60)
Glucose, Bld: 276 mg/dL — ABNORMAL HIGH (ref 70–99)
Potassium: 3.3 mmol/L — ABNORMAL LOW (ref 3.5–5.1)
Sodium: 127 mmol/L — ABNORMAL LOW (ref 135–145)
Total Bilirubin: 0.9 mg/dL (ref 0.3–1.2)
Total Protein: 6.3 g/dL — ABNORMAL LOW (ref 6.5–8.1)

## 2019-09-07 MED ORDER — AMOXICILLIN-POT CLAVULANATE 875-125 MG PO TABS: 1.0000 | ORAL_TABLET | Freq: Two times a day (BID) | ORAL | 0 refills | Status: DC

## 2019-09-07 MED ORDER — DEXTROSE 5 % IV SOLN
2.0000 g | INTRAVENOUS | Status: DC
  Filled 2019-09-07: qty 20

## 2019-09-07 MED ORDER — SODIUM CHLORIDE 0.9% FLUSH
10.0000 mL | INTRAVENOUS | Status: AC | PRN
  Administered 2019-09-07: 10 mL via INTRAVENOUS
  Filled 2019-09-07: qty 10

## 2019-09-07 MED ORDER — SODIUM CHLORIDE 0.9 % IV SOLN
Freq: Once | INTRAVENOUS | Status: AC
  Filled 2019-09-07: qty 250

## 2019-09-07 MED ORDER — DEXTROSE 5 % IV SOLN
2.0000 g | Freq: Once | INTRAVENOUS | Status: AC
  Administered 2019-09-07: 2 g via INTRAVENOUS
  Filled 2019-09-07: qty 20

## 2019-09-07 MED ORDER — HEPARIN SOD (PORK) LOCK FLUSH 100 UNIT/ML IV SOLN
500.0000 [IU] | Freq: Once | INTRAVENOUS | Status: AC
  Administered 2019-09-07: 500 [IU] via INTRAVENOUS
  Filled 2019-09-07: qty 5

## 2019-09-07 MED ORDER — AMOXICILLIN-POT CLAVULANATE 875-125 MG PO TABS: 1.0000 | ORAL_TABLET | Freq: Two times a day (BID) | ORAL | 0 refills | Status: AC

## 2019-09-07 MED ORDER — SODIUM CHLORIDE 0.9 % IV SOLN
INTRAVENOUS | Status: AC
  Filled 2019-09-07: qty 250

## 2019-09-07 NOTE — Patient Instructions (Signed)

## 2019-09-07 NOTE — Progress Notes (Signed)
Hematology and Oncology Follow Up Visit  Jeffrey Harris:[id number] Jun 06, 1956 64 y.o. 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 Hockey is back for his follow-up.  The good news is that the biliary catheter is now out.  This was removed last week.  The bad news is that he has some purulent drainage coming from the exit site.  He has tenderness and some erythema about the exit site.  I truly worry that he has an infection.  I expressed some purulent material.  I cultured this.  He will see interventional radiology today.  I worry that he may need to have some type of open procedure to drain this infection.  His bilirubin is down to 0.9.  He does not have any internal stent in place.  Hopefully, this is a good indicator for [place].    I am surprised that the CA 19-9 went up.  After the first cycle, the CA 19-9 went up to 17,800.  This may be elevation secondary to his biliary tree opening up.  Hopefully, the level will be back down.  He is having some pain where this biliary drainage catheter was.  He is constipated because of taking pain medication.  He took laxatives over the weekend which seemed to help.  He has had no bleeding.  He has had no leg swelling.  He has had no rashes.  Think he is quite dehydrated.  His sodium was only 127 and his chloride is 88.  We will go ahead and give him some IV fluids today.  I am going to have to hold on his chemotherapy for right now until we get this infection sorted out.    Overall, I would think that his performance status is ECOG 1.   Medications:  Current Outpatient Medications:  .  allopurinol (ZYLOPRIM) 300 MG tablet, Take 300 mg by mouth daily., Disp: , Rfl:  .  Aspirin-Acetaminophen-Caffeine (EXCEDRIN MIGRAINE PO), Take 1 tablet by mouth daily as needed (migraine)., Disp: , Rfl:  .   dexamethasone (DECADRON) 4 MG tablet, Take 2 tablets (8 mg total) by mouth daily. Start the day after chemotherapy for 3 days. Take with food., Disp: 8 tablet, Rfl: 5 .  Lactulose 20 GM/30ML SOLN, Take 30 mLs (20 g total) by mouth 2 (two) times daily at 10 AM and 5 PM. Please take 2 Tablespoons TWICE a day!!, Disp: 1000 mL, Rfl: 6 .  lidocaine-prilocaine (EMLA) cream, Apply to affected area once, Disp: 30 g, Rfl: 3 .  lipase/protease/amylase (CREON) 36000 UNITS CPEP capsule, Take 2 capsules (72,000 Units total) by mouth 3 (three) times daily before meals., Disp: 180 capsule, Rfl: 6 .  loperamide (IMODIUM A-D) 2 MG tablet, Take 2 at onset of diarrhea, then 1 every 2hrs until 12hr without a BM. May take 2 tab every 4hrs at bedtime. If diarrhea recurs repeat., Disp: 100 tablet, Rfl: 1 .  LORazepam (ATIVAN) 0.5 MG tablet, Take 1 tablet (0.5 mg total) by mouth every 6 (six) hours as needed for anxiety., Disp: 30 tablet, Rfl: 0 .  MELATONIN PO, Take 1 tablet by mouth daily., Disp: , Rfl:  .  methylPREDNISolone (MEDROL DOSEPAK) 4 MG TBPK tablet, Take as directed, Disp: 21 each, Rfl: 0 .  Multiple Vitamins-Minerals (CENTRUM ADULTS PO), Take 1 tablet by mouth daily., Disp: ,  Rfl:  .  ondansetron (ZOFRAN) 8 MG tablet, Take 1 tablet (8 mg total) by mouth 2 (two) times daily as needed. Start on day 3 after chemotherapy., Disp: 30 tablet, Rfl: 1 .  oxyCODONE-acetaminophen (PERCOCET/ROXICET) 5-325 MG tablet, Take 1 tablet by mouth every 6 (six) hours as needed for severe pain., Disp: , Rfl:  .  pantoprazole (PROTONIX) 40 MG tablet, Take 1 tablet (40 mg total) by mouth 2 (two) times daily., Disp: 60 tablet, Rfl: 0 .  prochlorperazine (COMPAZINE) 10 MG tablet, Take 1 tablet (10 mg total) by mouth every 6 (six) hours as needed (Nausea or vomiting)., Disp: 30 tablet, Rfl: 1 .  zolpidem (AMBIEN) 5 MG tablet, Take 1 tablet (5 mg total) by mouth at bedtime as needed for sleep., Disp: 30 tablet, Rfl: 1  Allergies: No Known  Allergies  Past Medical History, Surgical history, Social history, and Family History were reviewed and updated.  Review of Systems: Review of Systems  Constitutional: Positive for appetite change and unexpected weight change.  HENT:  Negative.   Eyes: Positive for icterus.  Respiratory: Negative.   Cardiovascular: Negative.   Gastrointestinal: Positive for abdominal pain and constipation.  Endocrine: Negative.   Genitourinary: Negative.    Musculoskeletal: Negative.   Skin: Negative.   Neurological: Negative.   Hematological: Negative.   Psychiatric/Behavioral: Negative.     Physical Exam:  weight is 210 lb (95.3 kg). His temporal temperature is 98.2 F (36.8 C). His blood pressure is 91/73 and his pulse is 109 (abnormal). His respiration is 16 and oxygen saturation is 94%.   Wt Readings from Last 3 Encounters:  09/07/19 210 lb (95.3 kg)  08/27/19 208 lb (94.3 kg)  08/24/19 208 lb (94.3 kg)    Physical Exam Vitals reviewed.  Constitutional:      Comments: Overall he is a well-developed and well-nourished white male.  He does not have scleral icterus.  He does not have any palatal jaundice.  There is no jaundice with his skin.  HENT:     Head: Normocephalic and atraumatic.  Eyes:     Pupils: Pupils are equal, round, and reactive to light.  Cardiovascular:     Rate and Rhythm: Normal rate and regular rhythm.     Heart sounds: Normal heart sounds.  Pulmonary:     Effort: Pulmonary effort is normal.     Breath sounds: Normal breath sounds.  Abdominal:     General: Bowel sounds are normal.     Palpations: Abdomen is soft.     Comments: His abdomen is soft.  He has a dressing over the right side of his abdomen where he had the biliary catheter removed.  There is purulent exudate coming from this area.  It is indurated.  It is tender.  He has good bowel sounds.  There is no tenderness over on the left side of his abdomen.  I cannot palpate his liver..  Musculoskeletal:         General: No tenderness or deformity. Normal range of motion.     Cervical back: Normal range of motion.  Lymphadenopathy:     Cervical: No cervical adenopathy.  Skin:    General: Skin is warm and dry.     Coloration: Skin is jaundiced.     Findings: No erythema or rash.  Neurological:     Mental Status: He is alert and oriented to person, place, and time.  Psychiatric:        Behavior: Behavior normal.  Thought Content: Thought content normal.        Judgment: Judgment normal.      Lab Results  Component Value Date   WBC 10.8 (H) 09/07/2019   HGB 10.7 (L) 09/07/2019   HCT 30.3 (L) 09/07/2019   MCV 92.9 09/07/2019   PLT 219 09/07/2019     Chemistry      Component Value Date/Time   NA 127 (L) 09/07/2019 0909   K 3.3 (L) 09/07/2019 0909   CL 88 (L) 09/07/2019 0909   CO2 30 09/07/2019 0909   BUN 6 (L) 09/07/2019 0909   CREATININE 0.72 09/07/2019 0909      Component Value Date/Time   CALCIUM 8.5 (L) 09/07/2019 0909   ALKPHOS 188 (H) 09/07/2019 0909   AST 18 09/07/2019 0909   ALT 23 09/07/2019 0909   BILITOT 0.9 09/07/2019 0909       Impression and Plan: Mr. Cartee is a 64 year old white male.  He has metastatic pancreatic cancer.  He has a very markedly elevated CA 19-9.  Again, despite the elevated CA 19-9, I have to believe that he is responding.  We will see what the CA 19-9 is today.  We will put his chemotherapy off for 1 week.  I think we have to do this so that we can get this infection sorted out.  Again, hopefully, he will not need any surgical procedure to open up the site of infection.  I will give him a dose of Rocephin in the office.  I will then put him on some Augmentin as an outpatient.  I had to spend about 35 minutes with him today.  This was a lot more complicated than I would have thought secondary to the infection at the biliary catheter site.   Volanda Napoleon, MD 1/4/202110:08 AM

## 2019-09-07 NOTE — Telephone Encounter (Signed)
Appointments scheduled calendar printed per 1/4 los 

## 2019-09-07 NOTE — Patient Instructions (Signed)

## 2019-09-07 NOTE — Telephone Encounter (Signed)
S: Patient s/p biliary drain removal 09/01/19 in IR seen by PA today for wound check - per patient previous biliary drain insertion site with significant foul smelling white/green drainage for several days. The area feels firm and is painful to the touch. He denies any fevers or chills. He was seen by Dr. Marin Olp today and was unable to undergo chemotherapy due to abscess concern.  O: RUQ wound with purulent drainage on exam today, moderate erythema and induration extending to the mid axillary line as well as approximately 3-4 cm above and below previous insertion site. Significantly tender to palpation.   A/P:  Concern for post procedure abscess given patient history and above exam findings. Per chart and patient wound has been cultured and he was given Rocephin x1 today and will be starting Augmentin PO this afternoon per Dr. Marin Olp. Appreciate and agree with current plan formulated by Dr. Marin Olp today - we will see patient back in IR on 09/11/19 at 0930 for wound re check and possible I&D if not improved. Discussed routine wound care with patient, discouraged continued hydrogen peroxide use due to concern for worsening skin breakdown. Patient encouraged to call with questions or concerns.  Candiss Norse, PA-C

## 2019-09-08 ENCOUNTER — Inpatient Hospital Stay: Payer: BC Managed Care – PPO | Admitting: Hematology & Oncology

## 2019-09-08 ENCOUNTER — Encounter: Payer: Self-pay | Admitting: *Deleted

## 2019-09-08 LAB — CANCER ANTIGEN 19-9: CA 19-9: 7091 U/mL — ABNORMAL HIGH (ref 0–35)

## 2019-09-09 ENCOUNTER — Inpatient Hospital Stay: Payer: BC Managed Care – PPO

## 2019-09-10 ENCOUNTER — Telehealth: Payer: Self-pay | Admitting: Student

## 2019-09-10 LAB — AEROBIC CULTURE W GRAM STAIN (SUPERFICIAL SPECIMEN)

## 2019-09-10 NOTE — Telephone Encounter (Signed)
IR.  History of biliary obstruction secondary to pancreatic mass s/p internal/external biliary drain placement in IR 07/26/2019 by Dr. Annamaria Boots; s/p biliary stent placement and internal/external biliary drain exchange in IR 08/27/2019; s/p internal/external biliary drain removal in IR 09/01/2019 by Dr. Annamaria Boots.  Patient called our office 09/07/2019 complaining of significant fowl smelling white/green discharge from his biliary drain insertion site. He was seen and evaluated by Dr. Marin Olp and Candiss Norse, PA-C on this date. At that time, patient's wound was cultured, he was given Rocephin x1, and started on Augmentin PO by Dr. Marin Olp. He was scheduled for wound re-check in IR 09/11/2019.  Patient called IR today requesting to cancel his appointment in IR tomorrow. States that the "oozing and swelling has stopped" from his wound. States "the antibiotics are doing their job". Advised patient to call us if his wound worsens (increased oozing, swelling). Tiffany aware of canceled appointment.  Please call IR with questions/concerns.   Bea Graff Erionna Strum, PA-C 09/10/2019, 9:03 AM

## 2019-09-11 ENCOUNTER — Other Ambulatory Visit (HOSPITAL_COMMUNITY): Payer: BC Managed Care – PPO

## 2019-09-14 ENCOUNTER — Encounter: Payer: Self-pay | Admitting: Hematology & Oncology

## 2019-09-14 ENCOUNTER — Other Ambulatory Visit: Payer: Self-pay | Admitting: *Deleted

## 2019-09-14 DIAGNOSIS — C787 Secondary malignant neoplasm of liver and intrahepatic bile duct: Secondary | ICD-10-CM

## 2019-09-14 DIAGNOSIS — C259 Malignant neoplasm of pancreas, unspecified: Secondary | ICD-10-CM

## 2019-09-14 MED ORDER — PANCRELIPASE (LIP-PROT-AMYL) 36000-114000 UNITS PO CPEP: 72000.0000 [IU] | ORAL_CAPSULE | Freq: Three times a day (TID) | ORAL | 6 refills | Status: DC

## 2019-09-15 ENCOUNTER — Inpatient Hospital Stay: Payer: BC Managed Care – PPO

## 2019-09-15 ENCOUNTER — Other Ambulatory Visit: Payer: Self-pay

## 2019-09-15 ENCOUNTER — Inpatient Hospital Stay (HOSPITAL_BASED_OUTPATIENT_CLINIC_OR_DEPARTMENT_OTHER): Payer: BC Managed Care – PPO | Admitting: Hematology & Oncology

## 2019-09-15 ENCOUNTER — Telehealth: Payer: Self-pay | Admitting: Hematology & Oncology

## 2019-09-15 ENCOUNTER — Encounter: Payer: Self-pay | Admitting: Hematology & Oncology

## 2019-09-15 VITALS — BP 105/84 | HR 100 | Temp 98.0°F | Resp 18 | Wt 210.0 lb

## 2019-09-15 DIAGNOSIS — C259 Malignant neoplasm of pancreas, unspecified: Secondary | ICD-10-CM | POA: Diagnosis not present

## 2019-09-15 DIAGNOSIS — Z5111 Encounter for antineoplastic chemotherapy: Secondary | ICD-10-CM | POA: Diagnosis not present

## 2019-09-15 DIAGNOSIS — C787 Secondary malignant neoplasm of liver and intrahepatic bile duct: Secondary | ICD-10-CM

## 2019-09-15 DIAGNOSIS — K5900 Constipation, unspecified: Secondary | ICD-10-CM | POA: Diagnosis not present

## 2019-09-15 DIAGNOSIS — R970 Elevated carcinoembryonic antigen [CEA]: Secondary | ICD-10-CM | POA: Diagnosis not present

## 2019-09-15 DIAGNOSIS — R739 Hyperglycemia, unspecified: Secondary | ICD-10-CM | POA: Diagnosis not present

## 2019-09-15 DIAGNOSIS — T8579XA Infection and inflammatory reaction due to other internal prosthetic devices, implants and grafts, initial encounter: Secondary | ICD-10-CM | POA: Diagnosis not present

## 2019-09-15 DIAGNOSIS — Z7689 Persons encountering health services in other specified circumstances: Secondary | ICD-10-CM | POA: Diagnosis not present

## 2019-09-15 DIAGNOSIS — G893 Neoplasm related pain (acute) (chronic): Secondary | ICD-10-CM | POA: Diagnosis not present

## 2019-09-15 DIAGNOSIS — E860 Dehydration: Secondary | ICD-10-CM | POA: Diagnosis not present

## 2019-09-15 DIAGNOSIS — Z79899 Other long term (current) drug therapy: Secondary | ICD-10-CM | POA: Diagnosis not present

## 2019-09-15 DIAGNOSIS — C250 Malignant neoplasm of head of pancreas: Secondary | ICD-10-CM | POA: Diagnosis not present

## 2019-09-15 LAB — CBC WITH DIFFERENTIAL (CANCER CENTER ONLY)
Abs Immature Granulocytes: 0.17 10*3/uL — ABNORMAL HIGH (ref 0.00–0.07)
Basophils Absolute: 0.1 10*3/uL (ref 0.0–0.1)
Basophils Relative: 1 %
Eosinophils Absolute: 0 10*3/uL (ref 0.0–0.5)
Eosinophils Relative: 0 %
HCT: 34.8 % — ABNORMAL LOW (ref 39.0–52.0)
Hemoglobin: 11.9 g/dL — ABNORMAL LOW (ref 13.0–17.0)
Immature Granulocytes: 2 %
Lymphocytes Relative: 27 %
Lymphs Abs: 2 10*3/uL (ref 0.7–4.0)
MCH: 32.8 pg (ref 26.0–34.0)
MCHC: 34.2 g/dL (ref 30.0–36.0)
MCV: 95.9 fL (ref 80.0–100.0)
Monocytes Absolute: 0.7 10*3/uL (ref 0.1–1.0)
Monocytes Relative: 9 %
Neutro Abs: 4.6 10*3/uL (ref 1.7–7.7)
Neutrophils Relative %: 61 %
Platelet Count: 363 10*3/uL (ref 150–400)
RBC: 3.63 MIL/uL — ABNORMAL LOW (ref 4.22–5.81)
RDW: 13.2 % (ref 11.5–15.5)
WBC Count: 7.5 10*3/uL (ref 4.0–10.5)
nRBC: 0 % (ref 0.0–0.2)

## 2019-09-15 LAB — CMP (CANCER CENTER ONLY)
ALT: 31 U/L (ref 0–44)
AST: 29 U/L (ref 15–41)
Albumin: 3.7 g/dL (ref 3.5–5.0)
Alkaline Phosphatase: 224 U/L — ABNORMAL HIGH (ref 38–126)
Anion gap: 9 (ref 5–15)
BUN: 8 mg/dL (ref 8–23)
CO2: 25 mmol/L (ref 22–32)
Calcium: 9.1 mg/dL (ref 8.9–10.3)
Chloride: 98 mmol/L (ref 98–111)
Creatinine: 0.75 mg/dL (ref 0.61–1.24)
GFR, Est AFR Am: >60 mL/min (ref >60)
GFR, Estimated: >60 mL/min (ref >60)
Glucose, Bld: 247 mg/dL — ABNORMAL HIGH (ref 70–99)
Potassium: 3.6 mmol/L (ref 3.5–5.1)
Sodium: 132 mmol/L — ABNORMAL LOW (ref 135–145)
Total Bilirubin: 0.5 mg/dL (ref 0.3–1.2)
Total Protein: 6.7 g/dL (ref 6.5–8.1)

## 2019-09-15 MED ORDER — LEUCOVORIN CALCIUM INJECTION 350 MG
400.0000 mg/m2 | Freq: Once | INTRAVENOUS | Status: AC
  Administered 2019-09-15: 928 mg via INTRAVENOUS
  Filled 2019-09-15: qty 46.4

## 2019-09-15 MED ORDER — OXALIPLATIN CHEMO INJECTION 100 MG/20ML
200.0000 mg | Freq: Once | INTRAVENOUS | Status: AC
  Administered 2019-09-15: 200 mg via INTRAVENOUS
  Filled 2019-09-15: qty 40

## 2019-09-15 MED ORDER — DEXTROSE 5 % IV SOLN
Freq: Once | INTRAVENOUS | Status: AC
  Filled 2019-09-15: qty 250

## 2019-09-15 MED ORDER — PALONOSETRON HCL INJECTION 0.25 MG/5ML
INTRAVENOUS | Status: AC
  Filled 2019-09-15: qty 5

## 2019-09-15 MED ORDER — INSULIN REGULAR HUMAN 100 UNIT/ML IJ SOLN
12.0000 [IU] | Freq: Once | INTRAMUSCULAR | Status: AC
  Administered 2019-09-15: 12 [IU] via SUBCUTANEOUS

## 2019-09-15 MED ORDER — FLUOROURACIL CHEMO INJECTION 2.5 GM/50ML
400.0000 mg/m2 | Freq: Once | INTRAVENOUS | Status: AC
  Administered 2019-09-15: 950 mg via INTRAVENOUS
  Filled 2019-09-15: qty 19

## 2019-09-15 MED ORDER — SODIUM CHLORIDE 0.9 % IV SOLN
Freq: Once | INTRAVENOUS | Status: AC
  Filled 2019-09-15: qty 5

## 2019-09-15 MED ORDER — HEPARIN SOD (PORK) LOCK FLUSH 100 UNIT/ML IV SOLN
500.0000 [IU] | Freq: Once | INTRAVENOUS | Status: DC | PRN
  Filled 2019-09-15: qty 5

## 2019-09-15 MED ORDER — IRINOTECAN HCL CHEMO INJECTION 100 MG/5ML
180.0000 mg/m2 | Freq: Once | INTRAVENOUS | Status: AC
  Administered 2019-09-15: 420 mg via INTRAVENOUS
  Filled 2019-09-15: qty 20

## 2019-09-15 MED ORDER — SODIUM CHLORIDE 0.9% FLUSH
10.0000 mL | INTRAVENOUS | Status: DC | PRN
  Filled 2019-09-15: qty 10

## 2019-09-15 MED ORDER — INSULIN REGULAR HUMAN 100 UNIT/ML IJ SOLN: 12.0000 [IU] | Freq: Once | INTRAMUSCULAR | Status: DC

## 2019-09-15 MED ORDER — PALONOSETRON HCL INJECTION 0.25 MG/5ML
0.2500 mg | Freq: Once | INTRAVENOUS | Status: AC
  Administered 2019-09-15: 0.25 mg via INTRAVENOUS

## 2019-09-15 MED ORDER — SODIUM CHLORIDE 0.9 % IV SOLN
2400.0000 mg/m2 | INTRAVENOUS | Status: DC
  Administered 2019-09-15: 5550 mg via INTRAVENOUS
  Filled 2019-09-15: qty 111

## 2019-09-15 NOTE — Progress Notes (Signed)
Hematology and Oncology Follow Up Visit  Jeffrey Harris XI:[id number] [birth year] 64 y.o. 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  He is doing much better.  The infection that he had on the right side of his abdomen where he had the biliary drainage catheter has healed up quite well.  He said after we gave her the IV antibiotics last week and also after a couple days of oral antibiotics, the infection just cleared up.  He feels a whole lot better.  He feels more energetic.  He has gained little bit of weight.  His appetite is good.  His last CA 19-9 was down to 7900.  He has had no problem with mouth sores.  He has had no problems with cough.  He has had no rashes.  There is been no issues with his bowels.  He has had no obvious diarrhea.  His blood sugar was a little bit elevated today at 247.  I will going give him a little bit of insulin.  I am just glad that his quality life is doing better right now.  I have to believe that the chemotherapy is now doing a good job on him.  Overall, I would think that his performance status is ECOG 1.   Medications:  Current Outpatient Medications:  .  allopurinol (ZYLOPRIM) 300 MG tablet, Take 300 mg by mouth daily., Disp: , Rfl:  .  amoxicillin-clavulanate (AUGMENTIN) 875-125 MG tablet, Take 1 tablet by mouth 2 (two) times daily for 10 days., Disp: 20 tablet, Rfl: 0 .  Aspirin-Acetaminophen-Caffeine (EXCEDRIN MIGRAINE PO), Take 1 tablet by mouth daily as needed (migraine)., Disp: , Rfl:  .  dexamethasone (DECADRON) 4 MG tablet, Take 2 tablets (8 mg total) by mouth daily. Start the day after chemotherapy for 3 days. Take with food., Disp: 8 tablet, Rfl: 5 .  Lactulose 20 GM/30ML SOLN, Take 30 mLs (20 g total) by mouth 2 (two) times daily at 10 AM and 5 PM. Please  take 2 Tablespoons TWICE a day!!, Disp: 1000 mL, Rfl: 6 .  lidocaine-prilocaine (EMLA) cream, Apply to affected area once, Disp: 30 g, Rfl: 3 .  lipase/protease/amylase (CREON) 36000 UNITS CPEP capsule, Take 2 capsules (72,000 Units total) by mouth 3 (three) times daily before meals., Disp: 180 capsule, Rfl: 6 .  loperamide (IMODIUM A-D) 2 MG tablet, Take 2 at onset of diarrhea, then 1 every 2hrs until 12hr without a BM. May take 2 tab every 4hrs at bedtime. If diarrhea recurs repeat., Disp: 100 tablet, Rfl: 1 .  LORazepam (ATIVAN) 0.5 MG tablet, Take 1 tablet (0.5 mg total) by mouth every 6 (six) hours as needed for anxiety., Disp: 30 tablet, Rfl: 0 .  MELATONIN PO, Take 1 tablet by mouth daily., Disp: , Rfl:  .  methylPREDNISolone (MEDROL DOSEPAK) 4 MG TBPK tablet, Take as directed, Disp: 21 each, Rfl: 0 .  Multiple Vitamins-Minerals (CENTRUM ADULTS PO), Take 1 tablet by mouth daily., Disp: , Rfl:  .  ondansetron (ZOFRAN) 8 MG tablet, Take 1 tablet (8 mg total) by mouth 2 (two) times daily as needed. Start on day 3 after chemotherapy., Disp: 30 tablet, Rfl: 1 .  oxyCODONE-acetaminophen (PERCOCET/ROXICET) 5-325 MG tablet, Take 1 tablet by mouth every 6 (six) hours as needed for severe  pain., Disp: , Rfl:  .  pantoprazole (PROTONIX) 40 MG tablet, Take 1 tablet (40 mg total) by mouth 2 (two) times daily., Disp: 60 tablet, Rfl: 0 .  prochlorperazine (COMPAZINE) 10 MG tablet, Take 1 tablet (10 mg total) by mouth every 6 (six) hours as needed (Nausea or vomiting)., Disp: 30 tablet, Rfl: 1 .  zolpidem (AMBIEN) 5 MG tablet, Take 1 tablet (5 mg total) by mouth at bedtime as needed for sleep., Disp: 30 tablet, Rfl: 1  Current Facility-Administered Medications:  .  insulin regular (NOVOLIN R) 100 units/mL injection 12 Units, 12 Units, Subcutaneous, Once, Jeffrey Harris, Jeffrey Cobb, MD  Facility-Administered Medications Ordered in Other Visits:  .  sodium chloride flush (NS) 0.9 % injection 10 mL, 10 mL, Intravenous,  PRN, Jeffrey Napoleon, MD, 10 mL at 09/07/19 1245  Allergies: No Known Allergies  Past Medical History, Surgical history, Social history, and Family History were reviewed and updated.  Review of Systems: Review of Systems  Constitutional: Positive for appetite change and unexpected weight change.  HENT:  Negative.   Eyes: Positive for icterus.  Respiratory: Negative.   Cardiovascular: Negative.   Gastrointestinal: Positive for abdominal pain and constipation.  Endocrine: Negative.   Genitourinary: Negative.    Musculoskeletal: Negative.   Skin: Negative.   Neurological: Negative.   Hematological: Negative.   Psychiatric/Behavioral: Negative.     Physical Exam:  weight is 210 lb (95.3 kg). His temporal temperature is 98 F (36.7 C). His blood pressure is 105/84 and his pulse is 100. His respiration is 18 and oxygen saturation is 100%.   Wt Readings from Last 3 Encounters:  09/15/19 210 lb (95.3 kg)  09/07/19 210 lb (95.3 kg)  08/27/19 208 lb (94.3 kg)    Physical Exam Vitals reviewed.  Constitutional:      Comments: Overall he is a well-developed and well-nourished white male.  He does not have scleral icterus.  He does not have any palatal jaundice.  There is no jaundice with his skin.  HENT:     Head: Normocephalic and atraumatic.  Eyes:     Pupils: Pupils are equal, round, and reactive to light.  Cardiovascular:     Rate and Rhythm: Normal rate and regular rhythm.     Heart sounds: Normal heart sounds.  Pulmonary:     Effort: Pulmonary effort is normal.     Breath sounds: Normal breath sounds.  Abdominal:     General: Bowel sounds are normal.     Palpations: Abdomen is soft.     Comments: His abdomen is soft.  He has a dressing over the right side of his abdomen where he had the biliary catheter removed.  There is purulent exudate coming from this area.  It is indurated.  It is tender.  He has good bowel sounds.  There is no tenderness over on the left side of  his abdomen.  I cannot palpate his liver..  Musculoskeletal:        General: No tenderness or deformity. Normal range of motion.     Cervical back: Normal range of motion.  Lymphadenopathy:     Cervical: No cervical adenopathy.  Skin:    General: Skin is warm and dry.     Coloration: Skin is jaundiced.     Findings: No erythema or rash.  Neurological:     Mental Status: He is alert and oriented to person, place, and time.  Psychiatric:        Behavior: Behavior normal.  Thought Content: Thought content normal.        Judgment: Judgment normal.      Lab Results  Component Value Date   WBC 7.5 09/15/2019   HGB 11.9 (L) 09/15/2019   HCT 34.8 (L) 09/15/2019   MCV 95.9 09/15/2019   PLT 363 09/15/2019     Chemistry      Component Value Date/Time   NA 132 (L) 09/15/2019 0920   K 3.6 09/15/2019 0920   CL 98 09/15/2019 0920   CO2 25 09/15/2019 0920   BUN 8 09/15/2019 0920   CREATININE 0.75 09/15/2019 0920      Component Value Date/Time   CALCIUM 9.1 09/15/2019 0920   ALKPHOS 224 (H) 09/15/2019 0920   AST 29 09/15/2019 0920   ALT 31 09/15/2019 0920   BILITOT 0.5 09/15/2019 0920       Impression and Plan: Mr. Free is a 64 year old white male.  He has metastatic pancreatic cancer.  He has a very markedly elevated CA 19-9.  I am just happy that this infection is finally cleared up.  I thought this was going to be a real problem that he might need to have surgery to open up the infection site.  The CA 19-9 coming down is also a good indicator that things are working.  We will go ahead with his third cycle of treatment.  Hopefully, hyperglycemia will not be a problem for him in the future.   We will plan to get him back in 2 weeks for his fourth cycle of treatment.  After the fourth cycle, we will then do our scans to see how everything looks.  Jeffrey Napoleon, MD 1/12/202110:29 AM

## 2019-09-15 NOTE — Progress Notes (Signed)
Dr Marin Olp approved Todays labwork.  Ok to treat.  Will give insulin for the blood glucose.  Orders to be put in by Dr. Marin Olp

## 2019-09-15 NOTE — Telephone Encounter (Signed)
Appointments scheduled and patient was given updated calendar per 1/12 los

## 2019-09-15 NOTE — Patient Instructions (Signed)
[facility] Discharge Instructions for Patients Receiving Chemotherapy  Today you received the following chemotherapy agents Oxaliplatin, Iriniotecan, 5FU, Leukovorin  To help prevent nausea and vomiting after your treatment, we encourage you to take your nausea medication as directed   If you develop nausea and vomiting that is not controlled by your nausea medication, call the clinic.   BELOW ARE SYMPTOMS THAT SHOULD BE REPORTED IMMEDIATELY:  *FEVER GREATER THAN 100.5 F  *CHILLS WITH OR WITHOUT FEVER  NAUSEA AND VOMITING THAT IS NOT CONTROLLED WITH YOUR NAUSEA MEDICATION  *UNUSUAL SHORTNESS OF BREATH  *UNUSUAL BRUISING OR BLEEDING  TENDERNESS IN MOUTH AND THROAT WITH OR WITHOUT PRESENCE OF ULCERS  *URINARY PROBLEMS  *BOWEL PROBLEMS  UNUSUAL RASH Items with * indicate a potential emergency and should be followed up as soon as possible.  Feel free to call the clinic should you have any questions or concerns. The clinic phone number is [phone].  Please show the [facility] at check-in to the Emergency Department and triage nurse.

## 2019-09-15 NOTE — Patient Instructions (Signed)
Implanted Port Insertion, Care After °This sheet gives you information about how to care for yourself after your procedure. Your health care provider may also give you more specific instructions. If you have problems or questions, contact your health care provider. °What can I expect after the procedure? °After the procedure, it is common to have: °· Discomfort at the port insertion site. °· Bruising on the skin over the port. This should improve over 3-4 days. °Follow these instructions at home: °Port care °· After your port is placed, you will get a manufacturer's information card. The card has information about your port. Keep this card with you at all times. °· Take care of the port as told by your health care provider. Ask your health care provider if you or a family member can get training for taking care of the port at home. A home health care nurse may also take care of the port. °· Make sure to remember what type of port you have. °Incision care ° °  ° °· Follow instructions from your health care provider about how to take care of your port insertion site. Make sure you: °? Wash your hands with soap and water before and after you change your bandage (dressing). If soap and water are not available, use hand sanitizer. °? Change your dressing as told by your health care provider. °? Leave stitches (sutures), skin glue, or adhesive strips in place. These skin closures may need to stay in place for 2 weeks or longer. If adhesive strip edges start to loosen and curl up, you may trim the loose edges. Do not remove adhesive strips completely unless your health care provider tells you to do that. °· Check your port insertion site every day for signs of infection. Check for: °? Redness, swelling, or pain. °? Fluid or blood. °? Warmth. °? Pus or a bad smell. °Activity °· Return to your normal activities as told by your health care provider. Ask your health care provider what activities are safe for you. °· Do not  lift anything that is heavier than 10 lb (4.5 kg), or the limit that you are told, until your health care provider says that it is safe. °General instructions °· Take over-the-counter and prescription medicines only as told by your health care provider. °· Do not take baths, swim, or use a hot tub until your health care provider approves. Ask your health care provider if you may take showers. You may only be allowed to take sponge baths. °· Do not drive for 24 hours if you were given a sedative during your procedure. °· Wear a medical alert bracelet in case of an emergency. This will tell any health care providers that you have a port. °· Keep all follow-up visits as told by your health care provider. This is important. °Contact a health care provider if: °· You cannot flush your port with saline as directed, or you cannot draw blood from the port. °· You have a fever or chills. °· You have redness, swelling, or pain around your port insertion site. °· You have fluid or blood coming from your port insertion site. °· Your port insertion site feels warm to the touch. °· You have pus or a bad smell coming from the port insertion site. °Get help right away if: °· You have chest pain or shortness of breath. °· You have bleeding from your port that you cannot control. °Summary °· Take care of the port as told by your health   care provider. Keep the manufacturer's information card with you at all times. °· Change your dressing as told by your health care provider. °· Contact a health care provider if you have a fever or chills or if you have redness, swelling, or pain around your port insertion site. °· Keep all follow-up visits as told by your health care provider. °This information is not intended to replace advice given to you by your health care provider. Make sure you discuss any questions you have with your health care provider. °Document Revised: 03/18/2018 Document Reviewed: 03/18/2018 °Elsevier Patient Education ©  2020 [employer] ° °

## 2019-09-16 LAB — CANCER ANTIGEN 19-9: CA 19-9: 8087 U/mL — ABNORMAL HIGH (ref 0–35)

## 2019-09-17 ENCOUNTER — Inpatient Hospital Stay: Payer: BC Managed Care – PPO

## 2019-09-17 ENCOUNTER — Other Ambulatory Visit: Payer: Self-pay

## 2019-09-17 VITALS — BP 101/76 | HR 67 | Temp 97.1°F | Resp 17

## 2019-09-17 DIAGNOSIS — C259 Malignant neoplasm of pancreas, unspecified: Secondary | ICD-10-CM | POA: Diagnosis not present

## 2019-09-17 DIAGNOSIS — Z79899 Other long term (current) drug therapy: Secondary | ICD-10-CM | POA: Diagnosis not present

## 2019-09-17 DIAGNOSIS — C787 Secondary malignant neoplasm of liver and intrahepatic bile duct: Secondary | ICD-10-CM | POA: Diagnosis not present

## 2019-09-17 DIAGNOSIS — Z5111 Encounter for antineoplastic chemotherapy: Secondary | ICD-10-CM | POA: Diagnosis not present

## 2019-09-17 DIAGNOSIS — R739 Hyperglycemia, unspecified: Secondary | ICD-10-CM | POA: Diagnosis not present

## 2019-09-17 DIAGNOSIS — Z7689 Persons encountering health services in other specified circumstances: Secondary | ICD-10-CM | POA: Diagnosis not present

## 2019-09-17 DIAGNOSIS — R970 Elevated carcinoembryonic antigen [CEA]: Secondary | ICD-10-CM | POA: Diagnosis not present

## 2019-09-17 DIAGNOSIS — T8579XA Infection and inflammatory reaction due to other internal prosthetic devices, implants and grafts, initial encounter: Secondary | ICD-10-CM | POA: Diagnosis not present

## 2019-09-17 DIAGNOSIS — G893 Neoplasm related pain (acute) (chronic): Secondary | ICD-10-CM | POA: Diagnosis not present

## 2019-09-17 DIAGNOSIS — C250 Malignant neoplasm of head of pancreas: Secondary | ICD-10-CM | POA: Diagnosis not present

## 2019-09-17 DIAGNOSIS — E860 Dehydration: Secondary | ICD-10-CM | POA: Diagnosis not present

## 2019-09-17 DIAGNOSIS — K5900 Constipation, unspecified: Secondary | ICD-10-CM | POA: Diagnosis not present

## 2019-09-17 MED ORDER — HEPARIN SOD (PORK) LOCK FLUSH 100 UNIT/ML IV SOLN
500.0000 [IU] | Freq: Once | INTRAVENOUS | Status: AC | PRN
  Administered 2019-09-17: 500 [IU]
  Filled 2019-09-17: qty 5

## 2019-09-17 MED ORDER — PEGFILGRASTIM-JMDB 6 MG/0.6ML ~~LOC~~ SOSY
PREFILLED_SYRINGE | SUBCUTANEOUS | Status: AC
  Filled 2019-09-17: qty 0.6

## 2019-09-17 MED ORDER — SODIUM CHLORIDE 0.9% FLUSH
10.0000 mL | INTRAVENOUS | Status: DC | PRN
  Administered 2019-09-17: 10 mL
  Filled 2019-09-17: qty 10

## 2019-09-17 MED ORDER — PEGFILGRASTIM-JMDB 6 MG/0.6ML ~~LOC~~ SOSY
6.0000 mg | PREFILLED_SYRINGE | Freq: Once | SUBCUTANEOUS | Status: AC
  Administered 2019-09-17: 6 mg via SUBCUTANEOUS

## 2019-09-21 ENCOUNTER — Other Ambulatory Visit: Payer: BC Managed Care – PPO

## 2019-09-21 ENCOUNTER — Ambulatory Visit: Payer: BC Managed Care – PPO

## 2019-09-21 ENCOUNTER — Ambulatory Visit: Payer: BC Managed Care – PPO | Admitting: Hematology & Oncology

## 2019-09-21 ENCOUNTER — Encounter: Payer: Self-pay | Admitting: Hematology & Oncology

## 2019-09-25 DIAGNOSIS — C259 Malignant neoplasm of pancreas, unspecified: Secondary | ICD-10-CM | POA: Diagnosis not present

## 2019-09-25 DIAGNOSIS — C787 Secondary malignant neoplasm of liver and intrahepatic bile duct: Secondary | ICD-10-CM | POA: Diagnosis not present

## 2019-09-28 ENCOUNTER — Other Ambulatory Visit: Payer: Self-pay | Admitting: Hematology & Oncology

## 2019-09-29 ENCOUNTER — Inpatient Hospital Stay (HOSPITAL_BASED_OUTPATIENT_CLINIC_OR_DEPARTMENT_OTHER): Payer: BC Managed Care – PPO | Admitting: Hematology & Oncology

## 2019-09-29 ENCOUNTER — Inpatient Hospital Stay: Payer: BC Managed Care – PPO

## 2019-09-29 ENCOUNTER — Encounter: Payer: Self-pay | Admitting: Hematology & Oncology

## 2019-09-29 ENCOUNTER — Telehealth: Payer: Self-pay | Admitting: *Deleted

## 2019-09-29 ENCOUNTER — Other Ambulatory Visit: Payer: Self-pay

## 2019-09-29 VITALS — BP 117/68 | HR 90 | Temp 97.1°F | Resp 17 | Wt 205.8 lb

## 2019-09-29 DIAGNOSIS — C259 Malignant neoplasm of pancreas, unspecified: Secondary | ICD-10-CM

## 2019-09-29 DIAGNOSIS — C787 Secondary malignant neoplasm of liver and intrahepatic bile duct: Secondary | ICD-10-CM | POA: Diagnosis not present

## 2019-09-29 DIAGNOSIS — T8579XA Infection and inflammatory reaction due to other internal prosthetic devices, implants and grafts, initial encounter: Secondary | ICD-10-CM | POA: Diagnosis not present

## 2019-09-29 DIAGNOSIS — R970 Elevated carcinoembryonic antigen [CEA]: Secondary | ICD-10-CM | POA: Diagnosis not present

## 2019-09-29 DIAGNOSIS — Z5111 Encounter for antineoplastic chemotherapy: Secondary | ICD-10-CM | POA: Diagnosis not present

## 2019-09-29 DIAGNOSIS — R739 Hyperglycemia, unspecified: Secondary | ICD-10-CM | POA: Diagnosis not present

## 2019-09-29 DIAGNOSIS — K5900 Constipation, unspecified: Secondary | ICD-10-CM | POA: Diagnosis not present

## 2019-09-29 DIAGNOSIS — Z79899 Other long term (current) drug therapy: Secondary | ICD-10-CM | POA: Diagnosis not present

## 2019-09-29 DIAGNOSIS — Z7689 Persons encountering health services in other specified circumstances: Secondary | ICD-10-CM | POA: Diagnosis not present

## 2019-09-29 DIAGNOSIS — G893 Neoplasm related pain (acute) (chronic): Secondary | ICD-10-CM | POA: Diagnosis not present

## 2019-09-29 DIAGNOSIS — E860 Dehydration: Secondary | ICD-10-CM | POA: Diagnosis not present

## 2019-09-29 DIAGNOSIS — C250 Malignant neoplasm of head of pancreas: Secondary | ICD-10-CM | POA: Diagnosis not present

## 2019-09-29 LAB — CBC WITH DIFFERENTIAL (CANCER CENTER ONLY)
Abs Immature Granulocytes: 0.29 10*3/uL — ABNORMAL HIGH (ref 0.00–0.07)
Basophils Absolute: 0 10*3/uL (ref 0.0–0.1)
Basophils Relative: 1 %
Eosinophils Absolute: 0 10*3/uL (ref 0.0–0.5)
Eosinophils Relative: 1 %
HCT: 32.7 % — ABNORMAL LOW (ref 39.0–52.0)
Hemoglobin: 11.2 g/dL — ABNORMAL LOW (ref 13.0–17.0)
Immature Granulocytes: 3 %
Lymphocytes Relative: 23 %
Lymphs Abs: 1.9 10*3/uL (ref 0.7–4.0)
MCH: 32.7 pg (ref 26.0–34.0)
MCHC: 34.3 g/dL (ref 30.0–36.0)
MCV: 95.6 fL (ref 80.0–100.0)
Monocytes Absolute: 0.7 10*3/uL (ref 0.1–1.0)
Monocytes Relative: 8 %
Neutro Abs: 5.5 10*3/uL (ref 1.7–7.7)
Neutrophils Relative %: 64 %
Platelet Count: 117 10*3/uL — ABNORMAL LOW (ref 150–400)
RBC: 3.42 MIL/uL — ABNORMAL LOW (ref 4.22–5.81)
RDW: 14.4 % (ref 11.5–15.5)
WBC Count: 8.4 10*3/uL (ref 4.0–10.5)
nRBC: 0.2 % (ref 0.0–0.2)

## 2019-09-29 LAB — CMP (CANCER CENTER ONLY)
ALT: 39 U/L (ref 0–44)
AST: 27 U/L (ref 15–41)
Albumin: 3.6 g/dL (ref 3.5–5.0)
Alkaline Phosphatase: 216 U/L — ABNORMAL HIGH (ref 38–126)
Anion gap: 9 (ref 5–15)
BUN: 7 mg/dL — ABNORMAL LOW (ref 8–23)
CO2: 26 mmol/L (ref 22–32)
Calcium: 8.8 mg/dL — ABNORMAL LOW (ref 8.9–10.3)
Chloride: 99 mmol/L (ref 98–111)
Creatinine: 0.64 mg/dL (ref 0.61–1.24)
GFR, Est AFR Am: >60 mL/min (ref >60)
GFR, Estimated: >60 mL/min (ref >60)
Glucose, Bld: 222 mg/dL — ABNORMAL HIGH (ref 70–99)
Potassium: 3 mmol/L — CL (ref 3.5–5.1)
Sodium: 134 mmol/L — ABNORMAL LOW (ref 135–145)
Total Bilirubin: 0.4 mg/dL (ref 0.3–1.2)
Total Protein: 6.4 g/dL — ABNORMAL LOW (ref 6.5–8.1)

## 2019-09-29 MED ORDER — PALONOSETRON HCL INJECTION 0.25 MG/5ML
INTRAVENOUS | Status: AC
  Filled 2019-09-29: qty 5

## 2019-09-29 MED ORDER — SODIUM CHLORIDE 0.9% FLUSH
10.0000 mL | INTRAVENOUS | Status: DC | PRN
  Filled 2019-09-29: qty 10

## 2019-09-29 MED ORDER — LEUCOVORIN CALCIUM INJECTION 350 MG
400.0000 mg/m2 | Freq: Once | INTRAVENOUS | Status: AC
  Administered 2019-09-29: 928 mg via INTRAVENOUS
  Filled 2019-09-29: qty 46.4

## 2019-09-29 MED ORDER — DEXTROSE 5 % IV SOLN
Freq: Once | INTRAVENOUS | Status: AC
  Filled 2019-09-29: qty 250

## 2019-09-29 MED ORDER — SODIUM CHLORIDE 0.9 % IV SOLN
2400.0000 mg/m2 | INTRAVENOUS | Status: DC
  Administered 2019-09-29: 5550 mg via INTRAVENOUS
  Filled 2019-09-29: qty 111

## 2019-09-29 MED ORDER — SODIUM CHLORIDE 0.9 % IV SOLN
Freq: Once | INTRAVENOUS | Status: AC
  Filled 2019-09-29: qty 5

## 2019-09-29 MED ORDER — PALONOSETRON HCL INJECTION 0.25 MG/5ML
0.2500 mg | Freq: Once | INTRAVENOUS | Status: AC
  Administered 2019-09-29: 0.25 mg via INTRAVENOUS

## 2019-09-29 MED ORDER — FLUOROURACIL CHEMO INJECTION 2.5 GM/50ML
400.0000 mg/m2 | Freq: Once | INTRAVENOUS | Status: AC
  Administered 2019-09-29: 950 mg via INTRAVENOUS
  Filled 2019-09-29: qty 19

## 2019-09-29 MED ORDER — IRINOTECAN HCL CHEMO INJECTION 100 MG/5ML
180.0000 mg/m2 | Freq: Once | INTRAVENOUS | Status: AC
  Administered 2019-09-29: 420 mg via INTRAVENOUS
  Filled 2019-09-29: qty 20

## 2019-09-29 MED ORDER — ATROPINE SULFATE 1 MG/ML IJ SOLN: 0.5000 mg | Freq: Once | INTRAMUSCULAR | Status: DC | PRN

## 2019-09-29 MED ORDER — INSULIN REGULAR HUMAN 100 UNIT/ML IJ SOLN
12.0000 [IU] | Freq: Once | INTRAMUSCULAR | Status: AC
  Administered 2019-09-29: 12 [IU] via SUBCUTANEOUS

## 2019-09-29 MED ORDER — HEPARIN SOD (PORK) LOCK FLUSH 100 UNIT/ML IV SOLN
500.0000 [IU] | Freq: Once | INTRAVENOUS | Status: DC | PRN
  Filled 2019-09-29: qty 5

## 2019-09-29 MED ORDER — OXALIPLATIN CHEMO INJECTION 100 MG/20ML
86.0000 mg/m2 | Freq: Once | INTRAVENOUS | Status: AC
  Administered 2019-09-29: 200 mg via INTRAVENOUS
  Filled 2019-09-29: qty 40

## 2019-09-29 NOTE — Progress Notes (Signed)
Hematology and Oncology Follow Up Visit  Jeffrey Harris [id number] 1956/08/26 64 y.o. 09/29/2019   Principle Diagnosis:   Metastatic adenocarcinoma of the pancreas - hepatic mets  Current Therapy:    FOLFIRINOX -- s/p cycle #3 -- started on 08/11/2019     Interim History:  Jeffrey Harris is back for his follow-up.  He is doing much better.  He looks quite good.  He feels well.  He wants to have a pizza.  I told him that he could have a pizza without any problems.  He has had no problems with pain.  He has had some increase in bowel movements.  He is on Creon.  He is not having diarrhea.  His blood sugars have still been on the high side.  His blood sugar today is 222.  We will give him a little insulin today.  His last CA 9-9 was 8100.  I will surprised by this.  I thought it would be lower.  He has had no cough.  He has had no shortness of breath.  There is been no bleeding.  He has had no leg swelling.  He has had no rashes.  Overall, his performance status is ECOG 1.    Medications:  Current Outpatient Medications:  .  allopurinol (ZYLOPRIM) 300 MG tablet, Take 300 mg by mouth daily., Disp: , Rfl:  .  Aspirin-Acetaminophen-Caffeine (EXCEDRIN MIGRAINE PO), Take 1 tablet by mouth daily as needed (migraine)., Disp: , Rfl:  .  dexamethasone (DECADRON) 4 MG tablet, Take 2 tablets (8 mg total) by mouth daily. Start the day after chemotherapy for 3 days. Take with food., Disp: 8 tablet, Rfl: 5 .  Lactulose 20 GM/30ML SOLN, Take 30 mLs (20 g total) by mouth 2 (two) times daily at 10 AM and 5 PM. Please take 2 Tablespoons TWICE a day!!, Disp: 1000 mL, Rfl: 6 .  lidocaine-prilocaine (EMLA) cream, Apply to affected area once, Disp: 30 g, Rfl: 3 .  lipase/protease/amylase (CREON) 36000 UNITS CPEP capsule, Take 2 capsules (72,000 Units total) by mouth 3 (three) times daily before meals., Disp: 180 capsule, Rfl: 6 .  loperamide (IMODIUM A-D) 2 MG tablet, Take 2 at onset of diarrhea, then 1 every  2hrs until 12hr without a BM. May take 2 tab every 4hrs at bedtime. If diarrhea recurs repeat., Disp: 100 tablet, Rfl: 1 .  LORazepam (ATIVAN) 0.5 MG tablet, Take 1 tablet (0.5 mg total) by mouth every 6 (six) hours as needed for anxiety., Disp: 30 tablet, Rfl: 0 .  MELATONIN PO, Take 1 tablet by mouth daily., Disp: , Rfl:  .  methylPREDNISolone (MEDROL DOSEPAK) 4 MG TBPK tablet, Take as directed, Disp: 21 each, Rfl: 0 .  Multiple Vitamins-Minerals (CENTRUM ADULTS PO), Take 1 tablet by mouth daily., Disp: , Rfl:  .  ondansetron (ZOFRAN) 8 MG tablet, Take 1 tablet (8 mg total) by mouth 2 (two) times daily as needed. Start on day 3 after chemotherapy., Disp: 30 tablet, Rfl: 1 .  oxyCODONE-acetaminophen (PERCOCET/ROXICET) 5-325 MG tablet, Take 1 tablet by mouth every 6 (six) hours as needed for severe pain., Disp: , Rfl:  .  pantoprazole (PROTONIX) 40 MG tablet, Take 1 tablet (40 mg total) by mouth 2 (two) times daily., Disp: 60 tablet, Rfl: 0 .  prochlorperazine (COMPAZINE) 10 MG tablet, Take 1 tablet (10 mg total) by mouth every 6 (six) hours as needed (Nausea or vomiting)., Disp: 30 tablet, Rfl: 1 .  zolpidem (AMBIEN) 5 MG tablet, Take 1  tablet (5 mg total) by mouth at bedtime as needed for sleep., Disp: 30 tablet, Rfl: 1 No current facility-administered medications for this visit.  Facility-Administered Medications Ordered in Other Visits:  .  sodium chloride flush (NS) 0.9 % injection 10 mL, 10 mL, Intravenous, PRN, Volanda Napoleon, MD, 10 mL at 09/07/19 1245  Allergies: No Known Allergies  Past Medical History, Surgical history, Social history, and Family History were reviewed and updated.  Review of Systems: Review of Systems  Constitutional: Positive for appetite change and unexpected weight change.  HENT:  Negative.   Eyes: Positive for icterus.  Respiratory: Negative.   Cardiovascular: Negative.   Gastrointestinal: Positive for abdominal pain and constipation.  Endocrine: Negative.    Genitourinary: Negative.    Musculoskeletal: Negative.   Skin: Negative.   Neurological: Negative.   Hematological: Negative.   Psychiatric/Behavioral: Negative.     Physical Exam:  vitals were not taken for this visit.   Wt Readings from Last 3 Encounters:  09/15/19 210 lb (95.3 kg)  09/07/19 210 lb (95.3 kg)  08/27/19 208 lb (94.3 kg)    Physical Exam Vitals reviewed.  Constitutional:      Comments: Overall he is a well-developed and well-nourished white male.  He does not have scleral icterus.  He does not have any palatal jaundice.  There is no jaundice with his skin.  HENT:     Head: Normocephalic and atraumatic.  Eyes:     Pupils: Pupils are equal, round, and reactive to light.  Cardiovascular:     Rate and Rhythm: Normal rate and regular rhythm.     Heart sounds: Normal heart sounds.  Pulmonary:     Effort: Pulmonary effort is normal.     Breath sounds: Normal breath sounds.  Abdominal:     General: Bowel sounds are normal.     Palpations: Abdomen is soft.     Comments: His abdomen is soft.  He has good bowel sounds.  The area on the right side of the abdomen is fully healed.  There is a little bit of erythema.  There is no swelling.  There clearly is no exudate.  There is no palpable abdominal mass.  There is no fluid wave.  There is no palpable liver or spleen tip.  Musculoskeletal:        General: No tenderness or deformity. Normal range of motion.     Cervical back: Normal range of motion.  Lymphadenopathy:     Cervical: No cervical adenopathy.  Skin:    General: Skin is warm and dry.     Coloration: Skin is jaundiced.     Findings: No erythema or rash.  Neurological:     Mental Status: He is alert and oriented to person, place, and time.  Psychiatric:        Behavior: Behavior normal.        Thought Content: Thought content normal.        Judgment: Judgment normal.      Lab Results  Component Value Date   WBC 7.5 09/15/2019   HGB 11.9 (L)  09/15/2019   HCT 34.8 (L) 09/15/2019   MCV 95.9 09/15/2019   PLT 363 09/15/2019     Chemistry      Component Value Date/Time   NA 132 (L) 09/15/2019 0920   K 3.6 09/15/2019 0920   CL 98 09/15/2019 0920   CO2 25 09/15/2019 0920   BUN 8 09/15/2019 0920   CREATININE 0.75 09/15/2019 0920  Component Value Date/Time   CALCIUM 9.1 09/15/2019 0920   ALKPHOS 224 (H) 09/15/2019 0920   AST 29 09/15/2019 0920   ALT 31 09/15/2019 0920   BILITOT 0.5 09/15/2019 0920       Impression and Plan: Mr. Haught is a 64 year old white male.  He has metastatic pancreatic cancer.  He has a very markedly elevated CA 19-9.  I we will go ahead with his fourth cycle of treatment today.  After this cycle, we will go ahead with our scans.  We will see how things look.  I still have to believe that his CT scan will be better.  His quality life is doing so much better right now.  We will plan to get her back in another 2 weeks.   Volanda Napoleon, MD 1/26/20218:24 AM

## 2019-09-29 NOTE — Telephone Encounter (Signed)
Dr. Ennever notified of potassium-3.0. No new orders received at this time.   °

## 2019-09-29 NOTE — Patient Instructions (Signed)
Fluorouracil, 5-FU injection What is this medicine? FLUOROURACIL, 5-FU (flure oh YOOR a sil) is a chemotherapy drug. It slows the growth of cancer cells. This medicine is used to treat many types of cancer like breast cancer, colon or rectal cancer, pancreatic cancer, and stomach cancer. This medicine may be used for other purposes; ask your health care provider or pharmacist if you have questions. COMMON BRAND NAME(S): Adrucil What should I tell my health care provider before I take this medicine? They need to know if you have any of these conditions:  blood disorders  dihydropyrimidine dehydrogenase (DPD) deficiency  infection (especially a virus infection such as chickenpox, cold sores, or herpes)  kidney disease  liver disease  malnourished, poor nutrition  recent or ongoing radiation therapy  an unusual or allergic reaction to fluorouracil, other chemotherapy, other medicines, foods, dyes, or preservatives  pregnant or trying to get pregnant  breast-feeding How should I use this medicine? This drug is given as an infusion or injection into a vein. It is administered in a hospital or clinic by a specially trained health care professional. Talk to your pediatrician regarding the use of this medicine in children. Special care may be needed. Overdosage: If you think you have taken too much of this medicine contact a poison control center or emergency room at once. NOTE: This medicine is only for you. Do not share this medicine with others. What if I miss a dose? It is important not to miss your dose. Call your doctor or health care professional if you are unable to keep an appointment. What may interact with this medicine?  allopurinol  cimetidine  dapsone  digoxin  hydroxyurea  leucovorin  levamisole  medicines for seizures like ethotoin, fosphenytoin, phenytoin  medicines to increase blood counts like filgrastim, pegfilgrastim, sargramostim  medicines that  treat or prevent blood clots like warfarin, enoxaparin, and dalteparin  methotrexate  metronidazole  pyrimethamine  some other chemotherapy drugs like busulfan, cisplatin, estramustine, vinblastine  trimethoprim  trimetrexate  vaccines Talk to your doctor or health care professional before taking any of these medicines:  acetaminophen  aspirin  ibuprofen  ketoprofen  naproxen This list may not describe all possible interactions. Give your health care provider a list of all the medicines, herbs, non-prescription drugs, or dietary supplements you use. Also tell them if you smoke, drink alcohol, or use illegal drugs. Some items may interact with your medicine. What should I watch for while using this medicine? Visit your doctor for checks on your progress. This drug may make you feel generally unwell. This is not uncommon, as chemotherapy can affect healthy cells as well as cancer cells. Report any side effects. Continue your course of treatment even though you feel ill unless your doctor tells you to stop. In some cases, you may be given additional medicines to help with side effects. Follow all directions for their use. Call your doctor or health care professional for advice if you get a fever, chills or sore throat, or other symptoms of a cold or flu. Do not treat yourself. This drug decreases your body's ability to fight infections. Try to avoid being around people who are sick. This medicine may increase your risk to bruise or bleed. Call your doctor or health care professional if you notice any unusual bleeding. Be careful brushing and flossing your teeth or using a toothpick because you may get an infection or bleed more easily. If you have any dental work done, tell your dentist you are  receiving this medicine. Avoid taking products that contain aspirin, acetaminophen, ibuprofen, naproxen, or ketoprofen unless instructed by your doctor. These medicines may hide a fever. Do not  become pregnant while taking this medicine. Women should inform their doctor if they wish to become pregnant or think they might be pregnant. There is a potential for serious side effects to an unborn child. Talk to your health care professional or pharmacist for more information. Do not breast-feed an infant while taking this medicine. Men should inform their doctor if they wish to father a child. This medicine may lower sperm counts. Do not treat diarrhea with over the counter products. Contact your doctor if you have diarrhea that lasts more than 2 days or if it is severe and watery. This medicine can make you more sensitive to the sun. Keep out of the sun. If you cannot avoid being in the sun, wear protective clothing and use sunscreen. Do not use sun lamps or tanning beds/booths. What side effects may I notice from receiving this medicine? Side effects that you should report to your doctor or health care professional as soon as possible:  allergic reactions like skin rash, itching or hives, swelling of the face, lips, or tongue  low blood counts - this medicine may decrease the number of white blood cells, red blood cells and platelets. You may be at increased risk for infections and bleeding.  signs of infection - fever or chills, cough, sore throat, pain or difficulty passing urine  signs of decreased platelets or bleeding - bruising, pinpoint red spots on the skin, black, tarry stools, blood in the urine  signs of decreased red blood cells - unusually weak or tired, fainting spells, lightheadedness  breathing problems  changes in vision  chest pain  mouth sores  nausea and vomiting  pain, swelling, redness at site where injected  pain, tingling, numbness in the hands or feet  redness, swelling, or sores on hands or feet  stomach pain  unusual bleeding Side effects that usually do not require medical attention (report to your doctor or health care professional if they  continue or are bothersome):  changes in finger or toe nails  diarrhea  dry or itchy skin  hair loss  headache  loss of appetite  sensitivity of eyes to the light  stomach upset  unusually teary eyes This list may not describe all possible side effects. Call your doctor for medical advice about side effects. You may report side effects to FDA at 1-800-FDA-1088. Where should I keep my medicine? This drug is given in a hospital or clinic and will not be stored at home. NOTE: This sheet is a summary. It may not cover all possible information. If you have questions about this medicine, talk to your doctor, pharmacist, or health care provider.  2020 [employer] Standard (2007-12-24 13:53:16) Leucovorin injection What is this medicine? LEUCOVORIN (loo koe VOR in) is used to prevent or treat the harmful effects of some medicines. This medicine is used to treat anemia caused by a low amount of folic acid in the body. It is also used with 5-fluorouracil (5-FU) to treat colon cancer. This medicine may be used for other purposes; ask your health care provider or pharmacist if you have questions. What should I tell my health care provider before I take this medicine? They need to know if you have any of these conditions:  anemia from low levels of vitamin B-12 in the blood  an unusual or allergic reaction to  leucovorin, folic acid, other medicines, foods, dyes, or preservatives  pregnant or trying to get pregnant  breast-feeding How should I use this medicine? This medicine is for injection into a muscle or into a vein. It is given by a health care professional in a hospital or clinic setting. Talk to your pediatrician regarding the use of this medicine in children. Special care may be needed. Overdosage: If you think you have taken too much of this medicine contact a poison control center or emergency room at once. NOTE: This medicine is only for you. Do not share this medicine with  others. What if I miss a dose? This does not apply. What may interact with this medicine?  capecitabine  fluorouracil  phenobarbital  phenytoin  primidone  trimethoprim-sulfamethoxazole This list may not describe all possible interactions. Give your health care provider a list of all the medicines, herbs, non-prescription drugs, or dietary supplements you use. Also tell them if you smoke, drink alcohol, or use illegal drugs. Some items may interact with your medicine. What should I watch for while using this medicine? Your condition will be monitored carefully while you are receiving this medicine. This medicine may increase the side effects of 5-fluorouracil, 5-FU. Tell your doctor or health care professional if you have diarrhea or mouth sores that do not get better or that get worse. What side effects may I notice from receiving this medicine? Side effects that you should report to your doctor or health care professional as soon as possible:  allergic reactions like skin rash, itching or hives, swelling of the face, lips, or tongue  breathing problems  fever, infection  mouth sores  unusual bleeding or bruising  unusually weak or tired Side effects that usually do not require medical attention (report to your doctor or health care professional if they continue or are bothersome):  constipation or diarrhea  loss of appetite  nausea, vomiting This list may not describe all possible side effects. Call your doctor for medical advice about side effects. You may report side effects to FDA at 1-800-FDA-1088. Where should I keep my medicine? This drug is given in a hospital or clinic and will not be stored at home. NOTE: This sheet is a summary. It may not cover all possible information. If you have questions about this medicine, talk to your doctor, pharmacist, or health care provider.  2020 [employer]/Gold Standard (2008-02-24 16:50:29) Irinotecan injection What is this  medicine? IRINOTECAN (ir in oh TEE kan ) is a chemotherapy drug. It is used to treat colon and rectal cancer. This medicine may be used for other purposes; ask your health care provider or pharmacist if you have questions. COMMON BRAND NAME(S): Camptosar What should I tell my health care provider before I take this medicine? They need to know if you have any of these conditions:  dehydration  diarrhea  infection (especially a virus infection such as chickenpox, cold sores, or herpes)  liver disease  low blood counts, like low white cell, platelet, or red cell counts  low levels of calcium, magnesium, or potassium in the blood  recent or ongoing radiation therapy  an unusual or allergic reaction to irinotecan, other medicines, foods, dyes, or preservatives  pregnant or trying to get pregnant  breast-feeding How should I use this medicine? This drug is given as an infusion into a vein. It is administered in a hospital or clinic by a specially trained health care professional. Talk to your pediatrician regarding the use of this medicine   in children. Special care may be needed. Overdosage: If you think you have taken too much of this medicine contact a poison control center or emergency room at once. NOTE: This medicine is only for you. Do not share this medicine with others. What if I miss a dose? It is important not to miss your dose. Call your doctor or health care professional if you are unable to keep an appointment. What may interact with this medicine? This medicine may interact with the following medications:  antiviral medicines for HIV or AIDS  certain antibiotics like rifampin or rifabutin  certain medicines for fungal infections like itraconazole, ketoconazole, posaconazole, and voriconazole  certain medicines for seizures like carbamazepine, phenobarbital, phenotoin  clarithromycin  gemfibrozil  nefazodone  St. John's Wort This list may not describe all  possible interactions. Give your health care provider a list of all the medicines, herbs, non-prescription drugs, or dietary supplements you use. Also tell them if you smoke, drink alcohol, or use illegal drugs. Some items may interact with your medicine. What should I watch for while using this medicine? Your condition will be monitored carefully while you are receiving this medicine. You will need important blood work done while you are taking this medicine. This drug may make you feel generally unwell. This is not uncommon, as chemotherapy can affect healthy cells as well as cancer cells. Report any side effects. Continue your course of treatment even though you feel ill unless your doctor tells you to stop. In some cases, you may be given additional medicines to help with side effects. Follow all directions for their use. You may get drowsy or dizzy. Do not drive, use machinery, or do anything that needs mental alertness until you know how this medicine affects you. Do not stand or sit up quickly, especially if you are an older patient. This reduces the risk of dizzy or fainting spells. Call your health care professional for advice if you get a fever, chills, or sore throat, or other symptoms of a cold or flu. Do not treat yourself. This medicine decreases your body's ability to fight infections. Try to avoid being around people who are sick. Avoid taking products that contain aspirin, acetaminophen, ibuprofen, naproxen, or ketoprofen unless instructed by your doctor. These medicines may hide a fever. This medicine may increase your risk to bruise or bleed. Call your doctor or health care professional if you notice any unusual bleeding. Be careful brushing and flossing your teeth or using a toothpick because you may get an infection or bleed more easily. If you have any dental work done, tell your dentist you are receiving this medicine. Do not become pregnant while taking this medicine or for 6 months  after stopping it. Women should inform their health care professional if they wish to become pregnant or think they might be pregnant. Men should not father a child while taking this medicine and for 3 months after stopping it. There is potential for serious side effects to an unborn child. Talk to your health care professional for more information. Do not breast-feed an infant while taking this medicine or for 7 days after stopping it. This medicine has caused ovarian failure in some women. This medicine may make it more difficult to get pregnant. Talk to your health care professional if you are concerned about your fertility. This medicine has caused decreased sperm counts in some men. This may make it more difficult to father a child. Talk to your health care professional if you   are concerned about your fertility. What side effects may I notice from receiving this medicine? Side effects that you should report to your doctor or health care professional as soon as possible:  allergic reactions like skin rash, itching or hives, swelling of the face, lips, or tongue  chest pain  diarrhea  flushing, runny nose, sweating during infusion  low blood counts - this medicine may decrease the number of white blood cells, red blood cells and platelets. You may be at increased risk for infections and bleeding.  nausea, vomiting  pain, swelling, warmth in the leg  signs of decreased platelets or bleeding - bruising, pinpoint red spots on the skin, black, tarry stools, blood in the urine  signs of infection - fever or chills, cough, sore throat, pain or difficulty passing urine  signs of decreased red blood cells - unusually weak or tired, fainting spells, lightheadedness Side effects that usually do not require medical attention (report to your doctor or health care professional if they continue or are bothersome):  constipation  hair loss  headache  loss of appetite  mouth sores  stomach  pain This list may not describe all possible side effects. Call your doctor for medical advice about side effects. You may report side effects to FDA at 1-800-FDA-1088. Where should I keep my medicine? This drug is given in a hospital or clinic and will not be stored at home. NOTE: This sheet is a summary. It may not cover all possible information. If you have questions about this medicine, talk to your doctor, pharmacist, or health care provider.  2020 [employer]/Gold Standard (2018-10-10 10:09:17) Oxaliplatin Injection What is this medicine? OXALIPLATIN (ox AL i PLA tin) is a chemotherapy drug. It targets fast dividing cells, like cancer cells, and causes these cells to die. This medicine is used to treat cancers of the colon and rectum, and many other cancers. This medicine may be used for other purposes; ask your health care provider or pharmacist if you have questions. COMMON BRAND NAME(S): Eloxatin What should I tell my health care provider before I take this medicine? They need to know if you have any of these conditions:  heart disease  history of irregular heartbeat  liver disease  low blood counts, like white cells, platelets, or red blood cells  lung or breathing disease, like asthma  take medicines that treat or prevent blood clots  tingling of the fingers or toes, or other nerve disorder  an unusual or allergic reaction to oxaliplatin, other chemotherapy, other medicines, foods, dyes, or preservatives  pregnant or trying to get pregnant  breast-feeding How should I use this medicine? This drug is given as an infusion into a vein. It is administered in a hospital or clinic by a specially trained health care professional. Talk to your pediatrician regarding the use of this medicine in children. Special care may be needed. Overdosage: If you think you have taken too much of this medicine contact a poison control center or emergency room at once. NOTE: This medicine is  only for you. Do not share this medicine with others. What if I miss a dose? It is important not to miss a dose. Call your doctor or health care professional if you are unable to keep an appointment. What may interact with this medicine? Do not take this medicine with any of the following medications:  cisapride  dronedarone  pimozide  thioridazine This medicine may also interact with the following medications:  aspirin and aspirin-like medicines    certain medicines that treat or prevent blood clots like warfarin, apixaban, dabigatran, and rivaroxaban  cisplatin  cyclosporine  diuretics  medicines for infection like acyclovir, adefovir, amphotericin B, bacitracin, cidofovir, foscarnet, ganciclovir, gentamicin, pentamidine, vancomycin  NSAIDs, medicines for pain and inflammation, like ibuprofen or naproxen  other medicines that prolong the QT interval (an abnormal heart rhythm)  pamidronate  zoledronic acid This list may not describe all possible interactions. Give your health care provider a list of all the medicines, herbs, non-prescription drugs, or dietary supplements you use. Also tell them if you smoke, drink alcohol, or use illegal drugs. Some items may interact with your medicine. What should I watch for while using this medicine? Your condition will be monitored carefully while you are receiving this medicine. You may need blood work done while you are taking this medicine. This medicine may make you feel generally unwell. This is not uncommon as chemotherapy can affect healthy cells as well as cancer cells. Report any side effects. Continue your course of treatment even though you feel ill unless your healthcare professional tells you to stop. This medicine can make you more sensitive to cold. Do not drink cold drinks or use ice. Cover exposed skin before coming in contact with cold temperatures or cold objects. When out in cold weather wear warm clothing and cover your  mouth and nose to warm the air that goes into your lungs. Tell your doctor if you get sensitive to the cold. Do not become pregnant while taking this medicine or for 9 months after stopping it. Women should inform their health care professional if they wish to become pregnant or think they might be pregnant. Men should not father a child while taking this medicine and for 6 months after stopping it. There is potential for serious side effects to an unborn child. Talk to your health care professional for more information. Do not breast-feed a child while taking this medicine or for 3 months after stopping it. This medicine has caused ovarian failure in some women. This medicine may make it more difficult to get pregnant. Talk to your health care professional if you are concerned about your fertility. This medicine has caused decreased sperm counts in some men. This may make it more difficult to father a child. Talk to your health care professional if you are concerned about your fertility. This medicine may increase your risk of getting an infection. Call your health care professional for advice if you get a fever, chills, or sore throat, or other symptoms of a cold or flu. Do not treat yourself. Try to avoid being around people who are sick. Avoid taking medicines that contain aspirin, acetaminophen, ibuprofen, naproxen, or ketoprofen unless instructed by your health care professional. These medicines may hide a fever. Be careful brushing or flossing your teeth or using a toothpick because you may get an infection or bleed more easily. If you have any dental work done, tell your dentist you are receiving this medicine. What side effects may I notice from receiving this medicine? Side effects that you should report to your doctor or health care professional as soon as possible:  allergic reactions like skin rash, itching or hives, swelling of the face, lips, or tongue  breathing problems  cough  low  blood counts - this medicine may decrease the number of white blood cells, red blood cells, and platelets. You may be at increased risk for infections and bleeding  nausea, vomiting  pain, redness, or irritation at site   where injected  pain, tingling, numbness in the hands or feet  signs and symptoms of bleeding such as bloody or black, tarry stools; red or dark brown urine; spitting up blood or brown material that looks like coffee grounds; red spots on the skin; unusual bruising or bleeding from the eyes, gums, or nose  signs and symptoms of a dangerous change in heartbeat or heart rhythm like chest pain; dizziness; fast, irregular heartbeat; palpitations; feeling faint or lightheaded; falls  signs and symptoms of infection like fever; chills; cough; sore throat; pain or trouble passing urine  signs and symptoms of liver injury like dark yellow or brown urine; general ill feeling or flu-like symptoms; light-colored stools; loss of appetite; nausea; right upper belly pain; unusually weak or tired; yellowing of the eyes or skin  signs and symptoms of low red blood cells or anemia such as unusually weak or tired; feeling faint or lightheaded; falls  signs and symptoms of muscle injury like dark urine; trouble passing urine or change in the amount of urine; unusually weak or tired; muscle pain; back pain Side effects that usually do not require medical attention (report to your doctor or health care professional if they continue or are bothersome):  changes in taste  diarrhea  gas  hair loss  loss of appetite  mouth sores This list may not describe all possible side effects. Call your doctor for medical advice about side effects. You may report side effects to FDA at 1-800-FDA-1088. Where should I keep my medicine? This drug is given in a hospital or clinic and will not be stored at home. NOTE: This sheet is a summary. It may not cover all possible information. If you have questions  about this medicine, talk to your doctor, pharmacist, or health care provider.  2020 [employer] Standard (2019-01-07 12:20:35)  

## 2019-09-30 ENCOUNTER — Encounter: Payer: Self-pay | Admitting: Hematology & Oncology

## 2019-09-30 ENCOUNTER — Other Ambulatory Visit: Payer: Self-pay | Admitting: *Deleted

## 2019-09-30 LAB — CANCER ANTIGEN 19-9: CA 19-9: 5475 U/mL — ABNORMAL HIGH (ref 0–35)

## 2019-09-30 MED ORDER — PANTOPRAZOLE SODIUM 40 MG PO TBEC: 40.0000 mg | DELAYED_RELEASE_TABLET | Freq: Two times a day (BID) | ORAL | 0 refills | Status: DC

## 2019-10-01 ENCOUNTER — Inpatient Hospital Stay: Payer: BC Managed Care – PPO

## 2019-10-01 ENCOUNTER — Other Ambulatory Visit: Payer: Self-pay

## 2019-10-01 ENCOUNTER — Encounter: Payer: Self-pay | Admitting: Hematology & Oncology

## 2019-10-01 VITALS — BP 97/72 | HR 77 | Temp 97.1°F | Resp 16

## 2019-10-01 DIAGNOSIS — C259 Malignant neoplasm of pancreas, unspecified: Secondary | ICD-10-CM

## 2019-10-01 DIAGNOSIS — Z7689 Persons encountering health services in other specified circumstances: Secondary | ICD-10-CM | POA: Diagnosis not present

## 2019-10-01 DIAGNOSIS — Z79899 Other long term (current) drug therapy: Secondary | ICD-10-CM | POA: Diagnosis not present

## 2019-10-01 DIAGNOSIS — T8579XA Infection and inflammatory reaction due to other internal prosthetic devices, implants and grafts, initial encounter: Secondary | ICD-10-CM | POA: Diagnosis not present

## 2019-10-01 DIAGNOSIS — C787 Secondary malignant neoplasm of liver and intrahepatic bile duct: Secondary | ICD-10-CM | POA: Diagnosis not present

## 2019-10-01 DIAGNOSIS — Z5111 Encounter for antineoplastic chemotherapy: Secondary | ICD-10-CM | POA: Diagnosis not present

## 2019-10-01 DIAGNOSIS — K5900 Constipation, unspecified: Secondary | ICD-10-CM | POA: Diagnosis not present

## 2019-10-01 DIAGNOSIS — R739 Hyperglycemia, unspecified: Secondary | ICD-10-CM | POA: Diagnosis not present

## 2019-10-01 DIAGNOSIS — C250 Malignant neoplasm of head of pancreas: Secondary | ICD-10-CM | POA: Diagnosis not present

## 2019-10-01 DIAGNOSIS — R970 Elevated carcinoembryonic antigen [CEA]: Secondary | ICD-10-CM | POA: Diagnosis not present

## 2019-10-01 DIAGNOSIS — G893 Neoplasm related pain (acute) (chronic): Secondary | ICD-10-CM | POA: Diagnosis not present

## 2019-10-01 DIAGNOSIS — E860 Dehydration: Secondary | ICD-10-CM | POA: Diagnosis not present

## 2019-10-01 MED ORDER — PEGFILGRASTIM-JMDB 6 MG/0.6ML ~~LOC~~ SOSY
PREFILLED_SYRINGE | SUBCUTANEOUS | Status: AC
  Filled 2019-10-01: qty 0.6

## 2019-10-01 MED ORDER — HEPARIN SOD (PORK) LOCK FLUSH 100 UNIT/ML IV SOLN
500.0000 [IU] | Freq: Once | INTRAVENOUS | Status: AC | PRN
  Administered 2019-10-01: 500 [IU]
  Filled 2019-10-01: qty 5

## 2019-10-01 MED ORDER — PEGFILGRASTIM-JMDB 6 MG/0.6ML ~~LOC~~ SOSY
6.0000 mg | PREFILLED_SYRINGE | Freq: Once | SUBCUTANEOUS | Status: AC
  Administered 2019-10-01: 14:00:00 6 mg via SUBCUTANEOUS

## 2019-10-01 MED ORDER — SODIUM CHLORIDE 0.9% FLUSH
10.0000 mL | INTRAVENOUS | Status: DC | PRN
  Administered 2019-10-01: 10 mL
  Filled 2019-10-01: qty 10

## 2019-10-01 NOTE — Patient Instructions (Signed)
Fluorouracil, 5-FU injection What is this medicine? FLUOROURACIL, 5-FU (flure oh YOOR a sil) is a chemotherapy drug. It slows the growth of cancer cells. This medicine is used to treat many types of cancer like breast cancer, colon or rectal cancer, pancreatic cancer, and stomach cancer. This medicine may be used for other purposes; ask your health care provider or pharmacist if you have questions. COMMON BRAND NAME(S): Adrucil What should I tell my health care provider before I take this medicine? They need to know if you have any of these conditions:  blood disorders  dihydropyrimidine dehydrogenase (DPD) deficiency  infection (especially a virus infection such as chickenpox, cold sores, or herpes)  kidney disease  liver disease  malnourished, poor nutrition  recent or ongoing radiation therapy  an unusual or allergic reaction to fluorouracil, other chemotherapy, other medicines, foods, dyes, or preservatives  pregnant or trying to get pregnant  breast-feeding How should I use this medicine? This drug is given as an infusion or injection into a vein. It is administered in a hospital or clinic by a specially trained health care professional. Talk to your pediatrician regarding the use of this medicine in children. Special care may be needed. Overdosage: If you think you have taken too much of this medicine contact a poison control center or emergency room at once. NOTE: This medicine is only for you. Do not share this medicine with others. What if I miss a dose? It is important not to miss your dose. Call your doctor or health care professional if you are unable to keep an appointment. What may interact with this medicine?  allopurinol  cimetidine  dapsone  digoxin  hydroxyurea  leucovorin  levamisole  medicines for seizures like ethotoin, fosphenytoin, phenytoin  medicines to increase blood counts like filgrastim, pegfilgrastim, sargramostim  medicines that  treat or prevent blood clots like warfarin, enoxaparin, and dalteparin  methotrexate  metronidazole  pyrimethamine  some other chemotherapy drugs like busulfan, cisplatin, estramustine, vinblastine  trimethoprim  trimetrexate  vaccines Talk to your doctor or health care professional before taking any of these medicines:  acetaminophen  aspirin  ibuprofen  ketoprofen  naproxen This list may not describe all possible interactions. Give your health care provider a list of all the medicines, herbs, non-prescription drugs, or dietary supplements you use. Also tell them if you smoke, drink alcohol, or use illegal drugs. Some items may interact with your medicine. What should I watch for while using this medicine? Visit your doctor for checks on your progress. This drug may make you feel generally unwell. This is not uncommon, as chemotherapy can affect healthy cells as well as cancer cells. Report any side effects. Continue your course of treatment even though you feel ill unless your doctor tells you to stop. In some cases, you may be given additional medicines to help with side effects. Follow all directions for their use. Call your doctor or health care professional for advice if you get a fever, chills or sore throat, or other symptoms of a cold or flu. Do not treat yourself. This drug decreases your body's ability to fight infections. Try to avoid being around people who are sick. This medicine may increase your risk to bruise or bleed. Call your doctor or health care professional if you notice any unusual bleeding. Be careful brushing and flossing your teeth or using a toothpick because you may get an infection or bleed more easily. If you have any dental work done, tell your dentist you are   receiving this medicine. Avoid taking products that contain aspirin, acetaminophen, ibuprofen, naproxen, or ketoprofen unless instructed by your doctor. These medicines may hide a fever. Do not  become pregnant while taking this medicine. Women should inform their doctor if they wish to become pregnant or think they might be pregnant. There is a potential for serious side effects to an unborn child. Talk to your health care professional or pharmacist for more information. Do not breast-feed an infant while taking this medicine. Men should inform their doctor if they wish to father a child. This medicine may lower sperm counts. Do not treat diarrhea with over the counter products. Contact your doctor if you have diarrhea that lasts more than 2 days or if it is severe and watery. This medicine can make you more sensitive to the sun. Keep out of the sun. If you cannot avoid being in the sun, wear protective clothing and use sunscreen. Do not use sun lamps or tanning beds/booths. What side effects may I notice from receiving this medicine? Side effects that you should report to your doctor or health care professional as soon as possible:  allergic reactions like skin rash, itching or hives, swelling of the face, lips, or tongue  low blood counts - this medicine may decrease the number of white blood cells, red blood cells and platelets. You may be at increased risk for infections and bleeding.  signs of infection - fever or chills, cough, sore throat, pain or difficulty passing urine  signs of decreased platelets or bleeding - bruising, pinpoint red spots on the skin, black, tarry stools, blood in the urine  signs of decreased red blood cells - unusually weak or tired, fainting spells, lightheadedness  breathing problems  changes in vision  chest pain  mouth sores  nausea and vomiting  pain, swelling, redness at site where injected  pain, tingling, numbness in the hands or feet  redness, swelling, or sores on hands or feet  stomach pain  unusual bleeding Side effects that usually do not require medical attention (report to your doctor or health care professional if they  continue or are bothersome):  changes in finger or toe nails  diarrhea  dry or itchy skin  hair loss  headache  loss of appetite  sensitivity of eyes to the light  stomach upset  unusually teary eyes This list may not describe all possible side effects. Call your doctor for medical advice about side effects. You may report side effects to FDA at 1-800-FDA-1088. Where should I keep my medicine? This drug is given in a hospital or clinic and will not be stored at home. NOTE: This sheet is a summary. It may not cover all possible information. If you have questions about this medicine, talk to your doctor, pharmacist, or health care provider.  2020 Elsevier/Gold Standard (2007-12-24 13:53:16)  

## 2019-10-06 ENCOUNTER — Other Ambulatory Visit: Payer: Self-pay

## 2019-10-06 ENCOUNTER — Encounter (HOSPITAL_BASED_OUTPATIENT_CLINIC_OR_DEPARTMENT_OTHER): Payer: Self-pay

## 2019-10-06 ENCOUNTER — Ambulatory Visit (HOSPITAL_BASED_OUTPATIENT_CLINIC_OR_DEPARTMENT_OTHER)
Admission: RE | Admit: 2019-10-06 | Discharge: 2019-10-06 | Disposition: A | Discharge status: Home / Self Care | Payer: BC Managed Care – PPO | Source: Ambulatory Visit | Attending: Hematology & Oncology | Admitting: Hematology & Oncology

## 2019-10-06 ENCOUNTER — Telehealth: Payer: Self-pay

## 2019-10-06 DIAGNOSIS — C259 Malignant neoplasm of pancreas, unspecified: Secondary | ICD-10-CM

## 2019-10-06 DIAGNOSIS — C787 Secondary malignant neoplasm of liver and intrahepatic bile duct: Secondary | ICD-10-CM

## 2019-10-06 MED ORDER — IOHEXOL 300 MG/ML  SOLN
100.0000 mL | Freq: Once | INTRAMUSCULAR | Status: AC | PRN
  Administered 2019-10-06: 100 mL via INTRAVENOUS

## 2019-10-06 NOTE — Telephone Encounter (Addendum)
Attached message given to pt via phone. dph  ----- Message from Volanda Napoleon, MD sent at 10/06/2019  1:03 PM EST ----- Call - the cancer is smaller!!!  Great job!!  Laurey Arrow

## 2019-10-07 ENCOUNTER — Encounter: Payer: Self-pay | Admitting: Hematology & Oncology

## 2019-10-13 ENCOUNTER — Inpatient Hospital Stay: Payer: BC Managed Care – PPO

## 2019-10-13 ENCOUNTER — Other Ambulatory Visit: Payer: Self-pay

## 2019-10-13 ENCOUNTER — Inpatient Hospital Stay: Payer: BC Managed Care – PPO | Attending: Hematology & Oncology

## 2019-10-13 ENCOUNTER — Encounter: Payer: Self-pay | Admitting: Hematology & Oncology

## 2019-10-13 ENCOUNTER — Inpatient Hospital Stay (HOSPITAL_BASED_OUTPATIENT_CLINIC_OR_DEPARTMENT_OTHER): Payer: BC Managed Care – PPO | Admitting: Hematology & Oncology

## 2019-10-13 DIAGNOSIS — Z5111 Encounter for antineoplastic chemotherapy: Secondary | ICD-10-CM | POA: Diagnosis not present

## 2019-10-13 DIAGNOSIS — Z7689 Persons encountering health services in other specified circumstances: Secondary | ICD-10-CM | POA: Diagnosis not present

## 2019-10-13 DIAGNOSIS — R739 Hyperglycemia, unspecified: Secondary | ICD-10-CM | POA: Insufficient documentation

## 2019-10-13 DIAGNOSIS — R5383 Other fatigue: Secondary | ICD-10-CM | POA: Diagnosis not present

## 2019-10-13 DIAGNOSIS — C787 Secondary malignant neoplasm of liver and intrahepatic bile duct: Secondary | ICD-10-CM

## 2019-10-13 DIAGNOSIS — C259 Malignant neoplasm of pancreas, unspecified: Secondary | ICD-10-CM

## 2019-10-13 DIAGNOSIS — E876 Hypokalemia: Secondary | ICD-10-CM | POA: Diagnosis not present

## 2019-10-13 DIAGNOSIS — G893 Neoplasm related pain (acute) (chronic): Secondary | ICD-10-CM | POA: Insufficient documentation

## 2019-10-13 DIAGNOSIS — Z79899 Other long term (current) drug therapy: Secondary | ICD-10-CM | POA: Insufficient documentation

## 2019-10-13 DIAGNOSIS — C250 Malignant neoplasm of head of pancreas: Secondary | ICD-10-CM | POA: Diagnosis not present

## 2019-10-13 DIAGNOSIS — K5900 Constipation, unspecified: Secondary | ICD-10-CM | POA: Insufficient documentation

## 2019-10-13 LAB — CMP (CANCER CENTER ONLY)
ALT: 22 U/L (ref 0–44)
AST: 15 U/L (ref 15–41)
Albumin: 3.4 g/dL — ABNORMAL LOW (ref 3.5–5.0)
Alkaline Phosphatase: 161 U/L — ABNORMAL HIGH (ref 38–126)
Anion gap: 9 (ref 5–15)
BUN: 6 mg/dL — ABNORMAL LOW (ref 8–23)
CO2: 27 mmol/L (ref 22–32)
Calcium: 8.5 mg/dL — ABNORMAL LOW (ref 8.9–10.3)
Chloride: 95 mmol/L — ABNORMAL LOW (ref 98–111)
Creatinine: 0.59 mg/dL — ABNORMAL LOW (ref 0.61–1.24)
GFR, Est AFR Am: >60 mL/min (ref >60)
GFR, Estimated: >60 mL/min (ref >60)
Glucose, Bld: 204 mg/dL — ABNORMAL HIGH (ref 70–99)
Potassium: 2.9 mmol/L — CL (ref 3.5–5.1)
Sodium: 131 mmol/L — ABNORMAL LOW (ref 135–145)
Total Bilirubin: 0.5 mg/dL (ref 0.3–1.2)
Total Protein: 6.3 g/dL — ABNORMAL LOW (ref 6.5–8.1)

## 2019-10-13 LAB — CBC WITH DIFFERENTIAL (CANCER CENTER ONLY)
Abs Immature Granulocytes: 0.08 10*3/uL — ABNORMAL HIGH (ref 0.00–0.07)
Basophils Absolute: 0 10*3/uL (ref 0.0–0.1)
Basophils Relative: 1 %
Eosinophils Absolute: 0 10*3/uL (ref 0.0–0.5)
Eosinophils Relative: 0 %
HCT: 29 % — ABNORMAL LOW (ref 39.0–52.0)
Hemoglobin: 10.3 g/dL — ABNORMAL LOW (ref 13.0–17.0)
Immature Granulocytes: 1 %
Lymphocytes Relative: 30 %
Lymphs Abs: 1.9 10*3/uL (ref 0.7–4.0)
MCH: 33.3 pg (ref 26.0–34.0)
MCHC: 35.5 g/dL (ref 30.0–36.0)
MCV: 93.9 fL (ref 80.0–100.0)
Monocytes Absolute: 0.6 10*3/uL (ref 0.1–1.0)
Monocytes Relative: 10 %
Neutro Abs: 3.8 10*3/uL (ref 1.7–7.7)
Neutrophils Relative %: 58 %
Platelet Count: 136 10*3/uL — ABNORMAL LOW (ref 150–400)
RBC: 3.09 MIL/uL — ABNORMAL LOW (ref 4.22–5.81)
RDW: 14.7 % (ref 11.5–15.5)
WBC Count: 6.4 10*3/uL (ref 4.0–10.5)
nRBC: 0 % (ref 0.0–0.2)

## 2019-10-13 LAB — IRON AND TIBC
Iron: 74 ug/dL (ref 42–163)
Saturation Ratios: 36 % (ref 20–55)
TIBC: 205 ug/dL (ref 202–409)
UIBC: 131 ug/dL (ref 117–376)

## 2019-10-13 LAB — FERRITIN: Ferritin: 1886 ng/mL — ABNORMAL HIGH (ref 24–336)

## 2019-10-13 MED ORDER — IRINOTECAN HCL CHEMO INJECTION 100 MG/5ML
180.0000 mg/m2 | Freq: Once | INTRAVENOUS | Status: AC
  Administered 2019-10-13: 13:00:00 420 mg via INTRAVENOUS
  Filled 2019-10-13: qty 20

## 2019-10-13 MED ORDER — ATROPINE SULFATE 1 MG/ML IJ SOLN: 0.5000 mg | Freq: Once | INTRAMUSCULAR | Status: DC | PRN

## 2019-10-13 MED ORDER — SODIUM CHLORIDE 0.9 % IV SOLN
2400.0000 mg/m2 | INTRAVENOUS | Status: DC
  Administered 2019-10-13: 15:00:00 5550 mg via INTRAVENOUS
  Filled 2019-10-13: qty 111

## 2019-10-13 MED ORDER — POTASSIUM CHLORIDE CRYS ER 20 MEQ PO TBCR
40.0000 meq | EXTENDED_RELEASE_TABLET | Freq: Two times a day (BID) | ORAL | Status: DC
  Administered 2019-10-13 (×2): 40 meq via ORAL
  Filled 2019-10-13: qty 2

## 2019-10-13 MED ORDER — DEXTROSE 5 % IV SOLN
Freq: Once | INTRAVENOUS | Status: AC
  Filled 2019-10-13: qty 250

## 2019-10-13 MED ORDER — OXALIPLATIN CHEMO INJECTION 100 MG/20ML
86.0000 mg/m2 | Freq: Once | INTRAVENOUS | Status: AC
  Administered 2019-10-13: 200 mg via INTRAVENOUS
  Filled 2019-10-13: qty 40

## 2019-10-13 MED ORDER — SODIUM CHLORIDE 0.9 % IV SOLN
Freq: Once | INTRAVENOUS | Status: AC
  Filled 2019-10-13: qty 5

## 2019-10-13 MED ORDER — PALONOSETRON HCL INJECTION 0.25 MG/5ML
INTRAVENOUS | Status: AC
  Filled 2019-10-13: qty 5

## 2019-10-13 MED ORDER — PALONOSETRON HCL INJECTION 0.25 MG/5ML
0.2500 mg | Freq: Once | INTRAVENOUS | Status: AC
  Administered 2019-10-13: 09:00:00 0.25 mg via INTRAVENOUS

## 2019-10-13 MED ORDER — FLUOROURACIL CHEMO INJECTION 2.5 GM/50ML
400.0000 mg/m2 | Freq: Once | INTRAVENOUS | Status: AC
  Administered 2019-10-13: 15:00:00 950 mg via INTRAVENOUS
  Filled 2019-10-13: qty 19

## 2019-10-13 MED ORDER — SODIUM CHLORIDE 0.9 % IV SOLN
Freq: Once | INTRAVENOUS | Status: DC
  Filled 2019-10-13: qty 250

## 2019-10-13 MED ORDER — SODIUM CHLORIDE 0.9 % IV SOLN
INTRAVENOUS | Status: DC
  Filled 2019-10-13: qty 250

## 2019-10-13 MED ORDER — LEUCOVORIN CALCIUM INJECTION 350 MG
400.0000 mg/m2 | Freq: Once | INTRAVENOUS | Status: AC
  Administered 2019-10-13: 13:00:00 928 mg via INTRAVENOUS
  Filled 2019-10-13: qty 46.4

## 2019-10-13 NOTE — Addendum Note (Signed)
Addended by: Volanda Napoleon on: 10/13/2019 09:13 AM   Modules accepted: Orders

## 2019-10-13 NOTE — Progress Notes (Signed)
Hematology and Oncology Follow Up Visit  Jeffrey Harris [id number] March 07, 1956 64 y.o. 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  Thankfully, he is doing quite well.  He did have a CT scan done on 10/06/2019.  The CT scan showed that he is responding.  His liver metastasis are smaller.  The primary tumor in the pancreas now measures 2.5 x 2.1 cm.  There is decrease in lymphadenopathy in the portacaval node.  His last CA 19-9 was down to 5000.  He does feel tired.  His potassium is 2.9.  This might be a problem.  We will give him some potassium today in the clinic.  I will also check iron studies on him.  He has had no problems with diarrhea.  He does have quite a bit of "gas."  There has been no fever.  He has had no bleeding.  He has had no cough.  He has had no leg swelling.  Overall, his performance status is ECOG 1.  Medications:  Current Outpatient Medications:  .  Aspirin-Acetaminophen-Caffeine (EXCEDRIN MIGRAINE PO), Take 1 tablet by mouth daily as needed (migraine)., Disp: , Rfl:  .  dexamethasone (DECADRON) 4 MG tablet, Take 2 tablets (8 mg total) by mouth daily. Start the day after chemotherapy for 3 days. Take with food., Disp: 8 tablet, Rfl: 5 .  Lactulose 20 GM/30ML SOLN, Take 30 mLs (20 g total) by mouth 2 (two) times daily at 10 AM and 5 PM. Please take 2 Tablespoons TWICE a day!! (Patient not taking: Reported on 09/29/2019), Disp: 1000 mL, Rfl: 6 .  lipase/protease/amylase (CREON) 36000 UNITS CPEP capsule, Take 2 capsules (72,000 Units total) by mouth 3 (three) times daily before meals., Disp: 180 capsule, Rfl: 6 .  loperamide (IMODIUM A-D) 2 MG tablet, Take 2 at onset of diarrhea, then 1 every 2hrs until 12hr without a BM. May take 2 tab every 4hrs at bedtime. If diarrhea recurs repeat.  (Patient not taking: Reported on 09/29/2019), Disp: 100 tablet, Rfl: 1 .  LORazepam (ATIVAN) 0.5 MG tablet, Take 1 tablet (0.5 mg total) by mouth every 6 (six) hours as needed for anxiety., Disp: 30 tablet, Rfl: 0 .  MELATONIN PO, Take 1 tablet by mouth daily., Disp: , Rfl:  .  Multiple Vitamins-Minerals (CENTRUM ADULTS PO), Take 1 tablet by mouth daily., Disp: , Rfl:  .  ondansetron (ZOFRAN) 8 MG tablet, Take 1 tablet (8 mg total) by mouth 2 (two) times daily as needed. Start on day 3 after chemotherapy. (Patient not taking: Reported on 09/29/2019), Disp: 30 tablet, Rfl: 1 .  oxyCODONE-acetaminophen (PERCOCET/ROXICET) 5-325 MG tablet, Take 1 tablet by mouth every 6 (six) hours as needed for severe pain., Disp: , Rfl:  .  pantoprazole (PROTONIX) 40 MG tablet, Take 1 tablet (40 mg total) by mouth 2 (two) times daily., Disp: 60 tablet, Rfl: 0 .  prochlorperazine (COMPAZINE) 10 MG tablet, Take 1 tablet (10 mg total) by mouth every 6 (six) hours as needed (Nausea or vomiting). (Patient not taking: Reported on 09/29/2019), Disp: 30 tablet, Rfl: 1 .  zolpidem (AMBIEN) 5 MG tablet, Take 1 tablet (5 mg total) by mouth at bedtime as needed for sleep., Disp: 30 tablet, Rfl: 1 No current facility-administered medications for this visit.  Facility-Administered Medications Ordered  in Other Visits:  .  sodium chloride flush (NS) 0.9 % injection 10 mL, 10 mL, Intravenous, PRN, Volanda Napoleon, MD, 10 mL at 09/07/19 1245  Allergies: No Known Allergies  Past Medical History, Surgical history, Social history, and Family History were reviewed and updated.  Review of Systems: Review of Systems  Constitutional: Positive for appetite change and unexpected weight change.  HENT:  Negative.   Eyes: Positive for icterus.  Respiratory: Negative.   Cardiovascular: Negative.   Gastrointestinal: Positive for abdominal pain and constipation.  Endocrine: Negative.   Genitourinary: Negative.    Musculoskeletal: Negative.     Skin: Negative.   Neurological: Negative.   Hematological: Negative.   Psychiatric/Behavioral: Negative.     Physical Exam:  weight is 202 lb 1.9 oz (91.7 kg). His temporal temperature is 97.1 F (36.2 C) (abnormal). His blood pressure is 102/78 and his pulse is 98. His respiration is 16 and oxygen saturation is 100%.   Wt Readings from Last 3 Encounters:  10/13/19 202 lb 1.9 oz (91.7 kg)  09/29/19 205 lb 12.8 oz (93.4 kg)  09/29/19 205 lb 12.8 oz (93.4 kg)    Physical Exam Vitals reviewed.  Constitutional:      Comments: Overall he is a well-developed and well-nourished white male.  He does not have scleral icterus.  He does not have any palatal jaundice.  There is no jaundice with his skin.  HENT:     Head: Normocephalic and atraumatic.  Eyes:     Pupils: Pupils are equal, round, and reactive to light.  Cardiovascular:     Rate and Rhythm: Normal rate and regular rhythm.     Heart sounds: Normal heart sounds.  Pulmonary:     Effort: Pulmonary effort is normal.     Breath sounds: Normal breath sounds.  Abdominal:     General: Bowel sounds are normal.     Palpations: Abdomen is soft.     Comments: His abdomen is soft.  He has good bowel sounds.  The area on the right side of the abdomen is fully healed.  There is a little bit of erythema.  There is no swelling.  There clearly is no exudate.  There is no palpable abdominal mass.  There is no fluid wave.  There is no palpable liver or spleen tip.  Musculoskeletal:        General: No tenderness or deformity. Normal range of motion.     Cervical back: Normal range of motion.  Lymphadenopathy:     Cervical: No cervical adenopathy.  Skin:    General: Skin is warm and dry.     Coloration: Skin is jaundiced.     Findings: No erythema or rash.  Neurological:     Mental Status: He is alert and oriented to person, place, and time.  Psychiatric:        Behavior: Behavior normal.        Thought Content: Thought content normal.         Judgment: Judgment normal.      Lab Results  Component Value Date   WBC 6.4 10/13/2019   HGB 10.3 (L) 10/13/2019   HCT 29.0 (L) 10/13/2019   MCV 93.9 10/13/2019   PLT 136 (L) 10/13/2019     Chemistry      Component Value Date/Time   NA 131 (L) 10/13/2019 0800   K 2.9 (LL) 10/13/2019 0800   CL 95 (L) 10/13/2019 0800   CO2 27 10/13/2019 0800   BUN  6 (L) 10/13/2019 0800   CREATININE 0.59 (L) 10/13/2019 0800      Component Value Date/Time   CALCIUM 8.5 (L) 10/13/2019 0800   ALKPHOS 161 (H) 10/13/2019 0800   AST 15 10/13/2019 0800   ALT 22 10/13/2019 0800   BILITOT 0.5 10/13/2019 0800       Impression and Plan: Jeffrey Harris is a 64 year old white male.  He has metastatic pancreatic cancer.  He has a very markedly elevated CA 19-9.  We are responding to treatment.  The CT scan shows it.  His CA 19-9 is coming down slowly but surely.  I am does happy that his quality of life is doing pretty well.  We will see how the potassium can improve his quality of life.  His blood sugars are on the high side.  He usually manages this at home.  I would like to go ahead with his fifth cycle of treatment.  We will do 4 more cycles, including this 1, and then repeat another CT scan.  I spent about 30 minutes with him today.  We went over the CT scan report.  I went over the lab work with him.  We had to order some potassium.  We will see what his iron studies show.  We will have him come back in 2 more weeks.   Volanda Napoleon, MD 2/9/20218:48 AM

## 2019-10-14 ENCOUNTER — Encounter: Payer: Self-pay | Admitting: Hematology & Oncology

## 2019-10-14 LAB — CANCER ANTIGEN 19-9: CA 19-9: 3888 U/mL — ABNORMAL HIGH (ref 0–35)

## 2019-10-15 ENCOUNTER — Inpatient Hospital Stay: Payer: BC Managed Care – PPO

## 2019-10-15 ENCOUNTER — Other Ambulatory Visit: Payer: Self-pay

## 2019-10-15 VITALS — BP 93/78 | HR 96 | Temp 97.8°F | Resp 18

## 2019-10-15 DIAGNOSIS — C250 Malignant neoplasm of head of pancreas: Secondary | ICD-10-CM | POA: Diagnosis not present

## 2019-10-15 DIAGNOSIS — R5383 Other fatigue: Secondary | ICD-10-CM | POA: Diagnosis not present

## 2019-10-15 DIAGNOSIS — Z79899 Other long term (current) drug therapy: Secondary | ICD-10-CM | POA: Diagnosis not present

## 2019-10-15 DIAGNOSIS — K5900 Constipation, unspecified: Secondary | ICD-10-CM | POA: Diagnosis not present

## 2019-10-15 DIAGNOSIS — G893 Neoplasm related pain (acute) (chronic): Secondary | ICD-10-CM | POA: Diagnosis not present

## 2019-10-15 DIAGNOSIS — C787 Secondary malignant neoplasm of liver and intrahepatic bile duct: Secondary | ICD-10-CM | POA: Diagnosis not present

## 2019-10-15 DIAGNOSIS — Z5111 Encounter for antineoplastic chemotherapy: Secondary | ICD-10-CM | POA: Diagnosis not present

## 2019-10-15 DIAGNOSIS — Z7689 Persons encountering health services in other specified circumstances: Secondary | ICD-10-CM | POA: Diagnosis not present

## 2019-10-15 DIAGNOSIS — C259 Malignant neoplasm of pancreas, unspecified: Secondary | ICD-10-CM

## 2019-10-15 DIAGNOSIS — R739 Hyperglycemia, unspecified: Secondary | ICD-10-CM | POA: Diagnosis not present

## 2019-10-15 DIAGNOSIS — E876 Hypokalemia: Secondary | ICD-10-CM | POA: Diagnosis not present

## 2019-10-15 MED ORDER — PEGFILGRASTIM-JMDB 6 MG/0.6ML ~~LOC~~ SOSY
6.0000 mg | PREFILLED_SYRINGE | Freq: Once | SUBCUTANEOUS | Status: AC
  Administered 2019-10-15: 13:00:00 6 mg via SUBCUTANEOUS

## 2019-10-15 MED ORDER — PEGFILGRASTIM-JMDB 6 MG/0.6ML ~~LOC~~ SOSY
PREFILLED_SYRINGE | SUBCUTANEOUS | Status: AC
  Filled 2019-10-15: qty 0.6

## 2019-10-15 MED ORDER — SODIUM CHLORIDE 0.9% FLUSH
10.0000 mL | INTRAVENOUS | Status: DC | PRN
  Administered 2019-10-15: 10 mL
  Filled 2019-10-15: qty 10

## 2019-10-15 MED ORDER — HEPARIN SOD (PORK) LOCK FLUSH 100 UNIT/ML IV SOLN
500.0000 [IU] | Freq: Once | INTRAVENOUS | Status: AC | PRN
  Administered 2019-10-15: 500 [IU]
  Filled 2019-10-15: qty 5

## 2019-10-15 NOTE — Patient Instructions (Signed)

## 2019-10-22 ENCOUNTER — Other Ambulatory Visit: Payer: Self-pay | Admitting: Hematology & Oncology

## 2019-10-22 ENCOUNTER — Ambulatory Visit: Payer: Self-pay

## 2019-10-22 ENCOUNTER — Encounter: Payer: Self-pay | Admitting: Hematology & Oncology

## 2019-10-22 NOTE — Telephone Encounter (Signed)
Incoming call from Patient with a complaint of being constipated.  Has taken laxative x2 with little results .  Patient reports  That his anus is very painful thinks that he has hemorrhoids , (internal). Report that anus is clogged. Stools are hard. .  Recommended  Preparation H products, eat oatmeal.  Further evaluation at Urgent Care.  Patient voiced understanding.       Reason for Disposition . Unable to have a bowel movement (BM) without laxative or enema  Answer Assessment - Initial Assessment Questions 1. STOOL PATTERN OR FREQUENCY: "How often do you pass bowel movements (BMs)?"  (Normal range: tid to q 3 days)  "When was the last BM passed?"       At least once a day 2. STRAINING: "Do you have to strain to have a BM?"    yes 3. RECTAL PAIN: "Does your rectum hurt when the stool comes out?" If so, ask: "Do you have hemorrhoids? How bad is the pain?"  (Scale 1-10; or mild, moderate, severe)     rectal pain 4. STOOL COMPOSITION: "Are the stools hard?"      Semi hard sponge 5. BLOOD ON STOOLS: "Has there been any blood on the toilet tissue or on the surface of the BM?" If so, ask: "When was the last time?"      denies 6. CHRONIC CONSTIPATION: "Is this a new problem for you?"  If no, ask: "How long have you had this problem?" (days, weeks, months)     hemmorhoid 7. CHANGES IN DIET: "Have there been any recent changes in your diet?"      denies 8. MEDICATIONS: "Have you been taking any new medications?"     denies 9. LAXATIVES: "Have you been using any laxatives or enemas?"  If yes, ask "What, how often, and when was the last time?"     *No Answer* 10. CAUSE: "What do you think is causing the constipation?"        *No Answer* 11. OTHER SYMPTOMS: "Do you have any other symptoms?" (e.g., abdominal pain, fever, vomiting)       *No Answer* 12. PREGNANCY: "Is there any chance you are pregnant?" "When was your last menstrual period?"       *No Answer*  Protocols used:  CONSTIPATION-A-AH

## 2019-10-23 ENCOUNTER — Other Ambulatory Visit: Payer: Self-pay | Admitting: Hematology & Oncology

## 2019-10-27 ENCOUNTER — Other Ambulatory Visit: Payer: Self-pay | Admitting: *Deleted

## 2019-10-27 ENCOUNTER — Inpatient Hospital Stay: Payer: BC Managed Care – PPO

## 2019-10-27 ENCOUNTER — Other Ambulatory Visit: Payer: Self-pay

## 2019-10-27 ENCOUNTER — Inpatient Hospital Stay (HOSPITAL_BASED_OUTPATIENT_CLINIC_OR_DEPARTMENT_OTHER): Payer: BC Managed Care – PPO | Admitting: Hematology & Oncology

## 2019-10-27 ENCOUNTER — Encounter: Payer: Self-pay | Admitting: Hematology & Oncology

## 2019-10-27 ENCOUNTER — Telehealth: Payer: Self-pay | Admitting: *Deleted

## 2019-10-27 VITALS — HR 100

## 2019-10-27 VITALS — BP 98/72 | HR 106 | Temp 96.9°F | Resp 20 | Wt 197.1 lb

## 2019-10-27 DIAGNOSIS — Z5111 Encounter for antineoplastic chemotherapy: Secondary | ICD-10-CM | POA: Diagnosis not present

## 2019-10-27 DIAGNOSIS — K5900 Constipation, unspecified: Secondary | ICD-10-CM | POA: Diagnosis not present

## 2019-10-27 DIAGNOSIS — Z7689 Persons encountering health services in other specified circumstances: Secondary | ICD-10-CM | POA: Diagnosis not present

## 2019-10-27 DIAGNOSIS — C787 Secondary malignant neoplasm of liver and intrahepatic bile duct: Secondary | ICD-10-CM | POA: Diagnosis not present

## 2019-10-27 DIAGNOSIS — G893 Neoplasm related pain (acute) (chronic): Secondary | ICD-10-CM | POA: Diagnosis not present

## 2019-10-27 DIAGNOSIS — C259 Malignant neoplasm of pancreas, unspecified: Secondary | ICD-10-CM

## 2019-10-27 DIAGNOSIS — E876 Hypokalemia: Secondary | ICD-10-CM | POA: Diagnosis not present

## 2019-10-27 DIAGNOSIS — R739 Hyperglycemia, unspecified: Secondary | ICD-10-CM | POA: Diagnosis not present

## 2019-10-27 DIAGNOSIS — C250 Malignant neoplasm of head of pancreas: Secondary | ICD-10-CM | POA: Diagnosis not present

## 2019-10-27 DIAGNOSIS — Z79899 Other long term (current) drug therapy: Secondary | ICD-10-CM | POA: Diagnosis not present

## 2019-10-27 DIAGNOSIS — R5383 Other fatigue: Secondary | ICD-10-CM | POA: Diagnosis not present

## 2019-10-27 LAB — CMP (CANCER CENTER ONLY)
ALT: 30 U/L (ref 0–44)
AST: 21 U/L (ref 15–41)
Albumin: 3.6 g/dL (ref 3.5–5.0)
Alkaline Phosphatase: 211 U/L — ABNORMAL HIGH (ref 38–126)
Anion gap: 12 (ref 5–15)
BUN: 8 mg/dL (ref 8–23)
CO2: 26 mmol/L (ref 22–32)
Calcium: 8.8 mg/dL — ABNORMAL LOW (ref 8.9–10.3)
Chloride: 97 mmol/L — ABNORMAL LOW (ref 98–111)
Creatinine: 0.64 mg/dL (ref 0.61–1.24)
GFR, Est AFR Am: >60 mL/min (ref >60)
GFR, Estimated: >60 mL/min (ref >60)
Glucose, Bld: 213 mg/dL — ABNORMAL HIGH (ref 70–99)
Potassium: 2.8 mmol/L — CL (ref 3.5–5.1)
Sodium: 135 mmol/L (ref 135–145)
Total Bilirubin: 0.3 mg/dL (ref 0.3–1.2)
Total Protein: 6.8 g/dL (ref 6.5–8.1)

## 2019-10-27 LAB — CBC WITH DIFFERENTIAL (CANCER CENTER ONLY)
Abs Immature Granulocytes: 0.66 10*3/uL — ABNORMAL HIGH (ref 0.00–0.07)
Basophils Absolute: 0.1 10*3/uL (ref 0.0–0.1)
Basophils Relative: 1 %
Eosinophils Absolute: 0 10*3/uL (ref 0.0–0.5)
Eosinophils Relative: 0 %
HCT: 26.9 % — ABNORMAL LOW (ref 39.0–52.0)
Hemoglobin: 9.2 g/dL — ABNORMAL LOW (ref 13.0–17.0)
Immature Granulocytes: 7 %
Lymphocytes Relative: 24 %
Lymphs Abs: 2.3 10*3/uL (ref 0.7–4.0)
MCH: 32.7 pg (ref 26.0–34.0)
MCHC: 34.2 g/dL (ref 30.0–36.0)
MCV: 95.7 fL (ref 80.0–100.0)
Monocytes Absolute: 1 10*3/uL (ref 0.1–1.0)
Monocytes Relative: 10 %
Neutro Abs: 5.7 10*3/uL (ref 1.7–7.7)
Neutrophils Relative %: 58 %
Platelet Count: 176 10*3/uL (ref 150–400)
RBC: 2.81 MIL/uL — ABNORMAL LOW (ref 4.22–5.81)
RDW: 15.4 % (ref 11.5–15.5)
WBC Count: 9.7 10*3/uL (ref 4.0–10.5)
nRBC: 0.9 % — ABNORMAL HIGH (ref 0.0–0.2)

## 2019-10-27 LAB — TSH: TSH: 0.784 u[IU]/mL (ref 0.320–4.118)

## 2019-10-27 LAB — IRON AND TIBC
Iron: 122 ug/dL (ref 42–163)
Saturation Ratios: 52 % (ref 20–55)
TIBC: 233 ug/dL (ref 202–409)
UIBC: 111 ug/dL — ABNORMAL LOW (ref 117–376)

## 2019-10-27 LAB — FERRITIN: Ferritin: 2101 ng/mL — ABNORMAL HIGH (ref 24–336)

## 2019-10-27 MED ORDER — PALONOSETRON HCL INJECTION 0.25 MG/5ML
0.2500 mg | Freq: Once | INTRAVENOUS | Status: AC
  Administered 2019-10-27: 0.25 mg via INTRAVENOUS

## 2019-10-27 MED ORDER — POTASSIUM CHLORIDE CRYS ER 20 MEQ PO TBCR
40.0000 meq | EXTENDED_RELEASE_TABLET | Freq: Once | ORAL | Status: AC
  Administered 2019-10-27: 40 meq via ORAL
  Filled 2019-10-27: qty 2

## 2019-10-27 MED ORDER — POTASSIUM CHLORIDE CRYS ER 20 MEQ PO TBCR: 40.0000 meq | EXTENDED_RELEASE_TABLET | Freq: Every day | ORAL | 1 refills | Status: DC

## 2019-10-27 MED ORDER — OXALIPLATIN CHEMO INJECTION 100 MG/20ML
86.0000 mg/m2 | Freq: Once | INTRAVENOUS | Status: AC
  Administered 2019-10-27: 200 mg via INTRAVENOUS
  Filled 2019-10-27: qty 40

## 2019-10-27 MED ORDER — SODIUM CHLORIDE 0.9 % IV SOLN
Freq: Once | INTRAVENOUS | Status: AC
  Filled 2019-10-27: qty 5

## 2019-10-27 MED ORDER — LEUCOVORIN CALCIUM INJECTION 350 MG
400.0000 mg/m2 | Freq: Once | INTRAVENOUS | Status: AC
  Administered 2019-10-27: 928 mg via INTRAVENOUS
  Filled 2019-10-27: qty 46.4

## 2019-10-27 MED ORDER — FLUOROURACIL CHEMO INJECTION 2.5 GM/50ML
850.0000 mg | Freq: Once | INTRAVENOUS | Status: AC
  Administered 2019-10-27: 850 mg via INTRAVENOUS
  Filled 2019-10-27: qty 17

## 2019-10-27 MED ORDER — DEXTROSE 5 % IV SOLN
Freq: Once | INTRAVENOUS | Status: AC
  Filled 2019-10-27: qty 250

## 2019-10-27 MED ORDER — SODIUM CHLORIDE 0.9 % IV SOLN
5000.0000 mg | INTRAVENOUS | Status: DC
  Administered 2019-10-27: 5000 mg via INTRAVENOUS
  Filled 2019-10-27: qty 100

## 2019-10-27 MED ORDER — IRINOTECAN HCL CHEMO INJECTION 100 MG/5ML
180.0000 mg/m2 | Freq: Once | INTRAVENOUS | Status: AC
  Administered 2019-10-27: 420 mg via INTRAVENOUS
  Filled 2019-10-27: qty 2

## 2019-10-27 MED ORDER — PALONOSETRON HCL INJECTION 0.25 MG/5ML
INTRAVENOUS | Status: AC
  Filled 2019-10-27: qty 5

## 2019-10-27 NOTE — Progress Notes (Signed)
Hematology and Oncology Follow Up Visit  [name] Sep 22, 1955 64 y.o. 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 is back for his follow-up.  His main problem right now is that he is having some rectal issues.  It sounds like he may have some internal hemorrhoids.  We gave him some glycerin suppositories to see if this might help a little bit.  He said he had a little bit of constipation.  He takes a laxative and this seems to help.  He has had a decrease in his CA 19-9.  It is now down to 3900.  This was done back on 10/13/2019.  He has had no nausea or vomiting.  He says he is eating okay.  There is constipation rectal pain has limited his eating a little bit.  His blood sugars have been on the high side.  We may need to do someone about his blood sugars.  I told him that he really needs to get in more calories.  If his blood sugars are little on the high side, this will not be a hindrance.  He has had no bleeding.  He has had no cough or shortness of breath.  He has had no mouth sores.  There has been no headache.  Overall, his performance status is ECOG 1.  Medications:  Current Outpatient Medications:  .  Aspirin-Acetaminophen-Caffeine (EXCEDRIN MIGRAINE PO), Take 1 tablet by mouth daily as needed (migraine)., Disp: , Rfl:  .  dexamethasone (DECADRON) 4 MG tablet, Take 2 tablets (8 mg total) by mouth daily. Start the day after chemotherapy for 3 days. Take with food., Disp: 8 tablet, Rfl: 5 .  Lactulose 20 GM/30ML SOLN, Take 30 mLs (20 g total) by mouth 2 (two) times daily at 10 AM and 5 PM. Please take 2 Tablespoons TWICE a day!!, Disp: 1000 mL, Rfl: 6 .  lipase/protease/amylase (CREON) 36000 UNITS CPEP capsule, Take 2 capsules (72,000 Units total) by mouth 3 (three) times daily before meals., Disp: 180 capsule,  Rfl: 6 .  LORazepam (ATIVAN) 0.5 MG tablet, Take 1 tablet (0.5 mg total) by mouth every 6 (six) hours as needed for anxiety., Disp: 30 tablet, Rfl: 0 .  ondansetron (ZOFRAN) 8 MG tablet, Take 1 tablet (8 mg total) by mouth 2 (two) times daily as needed. Start on day 3 after chemotherapy., Disp: 30 tablet, Rfl: 1 .  oxyCODONE-acetaminophen (PERCOCET/ROXICET) 5-325 MG tablet, Take 1 tablet by mouth every 6 (six) hours as needed for severe pain., Disp: , Rfl:  .  loperamide (IMODIUM A-D) 2 MG tablet, Take 2 at onset of diarrhea, then 1 every 2hrs until 12hr without a BM. May take 2 tab every 4hrs at bedtime. If diarrhea recurs repeat. (Patient not taking: Reported on 09/29/2019), Disp: 100 tablet, Rfl: 1 .  MELATONIN PO, Take 1 tablet by mouth daily., Disp: , Rfl:  .  Multiple Vitamins-Minerals (CENTRUM ADULTS PO), Take 1 tablet by mouth daily., Disp: , Rfl:  .  pantoprazole (PROTONIX) 40 MG tablet, TAKE 1 TABLET BY MOUTH TWICE A DAY (Patient not taking: Reported on 10/27/2019), Disp: 60 tablet, Rfl: 3 .  prochlorperazine (COMPAZINE) 10 MG tablet, Take 1 tablet (10 mg total) by mouth every 6 (six) hours as needed (Nausea or vomiting). (Patient not taking: Reported on 09/29/2019), Disp: 30  tablet, Rfl: 1 .  zolpidem (AMBIEN) 5 MG tablet, Take 1 tablet (5 mg total) by mouth at bedtime as needed for sleep. (Patient not taking: Reported on 10/27/2019), Disp: 30 tablet, Rfl: 1 No current facility-administered medications for this visit.  Facility-Administered Medications Ordered in Other Visits:  .  sodium chloride flush (NS) 0.9 % injection 10 mL, 10 mL, Intravenous, PRN, Volanda Napoleon, MD, 10 mL at 09/07/19 1245  Allergies: No Known Allergies  Past Medical History, Surgical history, Social history, and Family History were reviewed and updated.  Review of Systems: Review of Systems  Constitutional: Positive for appetite change and unexpected weight change.  HENT:  Negative.   Eyes: Positive for  icterus.  Respiratory: Negative.   Cardiovascular: Negative.   Gastrointestinal: Positive for abdominal pain and constipation.  Endocrine: Negative.   Genitourinary: Negative.    Musculoskeletal: Negative.   Skin: Negative.   Neurological: Negative.   Hematological: Negative.   Psychiatric/Behavioral: Negative.     Physical Exam:  weight is 197 lb 1.3 oz (89.4 kg). His temporal temperature is 96.9 F (36.1 C) (abnormal). His blood pressure is 98/72 and his pulse is 106 (abnormal). His respiration is 20 and oxygen saturation is 100%.   Wt Readings from Last 3 Encounters:  10/27/19 197 lb 1.3 oz (89.4 kg)  10/13/19 202 lb 1.9 oz (91.7 kg)  09/29/19 205 lb 12.8 oz (93.4 kg)    Physical Exam Vitals reviewed.  Constitutional:      Comments: Overall he is a well-developed and well-nourished white male.  He does not have scleral icterus.  He does not have any palatal jaundice.  There is no jaundice with his skin.  HENT:     Head: Normocephalic and atraumatic.  Eyes:     Pupils: Pupils are equal, round, and reactive to light.  Cardiovascular:     Rate and Rhythm: Normal rate and regular rhythm.     Heart sounds: Normal heart sounds.  Pulmonary:     Effort: Pulmonary effort is normal.     Breath sounds: Normal breath sounds.  Abdominal:     General: Bowel sounds are normal.     Palpations: Abdomen is soft.     Comments: His abdomen is soft.  He has good bowel sounds.  The area on the right side of the abdomen is fully healed.  There is a little bit of erythema.  There is no swelling.  There clearly is no exudate.  There is no palpable abdominal mass.  There is no fluid wave.  There is no palpable liver or spleen tip.  Musculoskeletal:        General: No tenderness or deformity. Normal range of motion.     Cervical back: Normal range of motion.  Lymphadenopathy:     Cervical: No cervical adenopathy.  Skin:    General: Skin is warm and dry.     Coloration: Skin is jaundiced.      Findings: No erythema or rash.  Neurological:     Mental Status: He is alert and oriented to person, place, and time.  Psychiatric:        Behavior: Behavior normal.        Thought Content: Thought content normal.        Judgment: Judgment normal.      Lab Results  Component Value Date   WBC 6.4 10/13/2019   HGB 10.3 (L) 10/13/2019   HCT 29.0 (L) 10/13/2019   MCV 93.9 10/13/2019   PLT  136 (L) 10/13/2019     Chemistry      Component Value Date/Time   NA 131 (L) 10/13/2019 0800   K 2.9 (LL) 10/13/2019 0800   CL 95 (L) 10/13/2019 0800   CO2 27 10/13/2019 0800   BUN 6 (L) 10/13/2019 0800   CREATININE 0.59 (L) 10/13/2019 0800      Component Value Date/Time   CALCIUM 8.5 (L) 10/13/2019 0800   ALKPHOS 161 (H) 10/13/2019 0800   AST 15 10/13/2019 0800   ALT 22 10/13/2019 0800   BILITOT 0.5 10/13/2019 0800       Impression and Plan: Mr. Patton is a 64 year old white male.  He has metastatic pancreatic cancer.  He has a very markedly elevated CA 19-9.  We are responding to treatment.  The CT scan that he had done about 4 weeks ago shows it.  His CA 19-9 is coming down slowly but surely.  I am hoping that this constipation and rectal problem does not persist.  Hopefully, his constipation will improve.  We will see what his CA 19-9 is.  Hopefully it will still continue to improve.  We will see what his blood sugars are and try to maybe give him a little something to help with his hyperglycemia.  We will plan to get him back in 2 more weeks.   Volanda Napoleon, MD 2/23/20218:44 AM

## 2019-10-27 NOTE — Telephone Encounter (Signed)
Dr. Marin Olp notified of potassium-2.8.  Order received for pt to get 40 meq po potassium now and 40 meq po potassium prior to discharge today.  Also, a prescription for potassium 40 meq po will be sent to pt.'s pharmacy [facility] in [place].

## 2019-10-27 NOTE — Progress Notes (Signed)
Patient has had weight loss. Dr. Marin Olp will adjust the fluorouracil doses downward to reflect this and leave his other medications at the same doses for today. Orders changed per his instructions.

## 2019-10-27 NOTE — Patient Instructions (Signed)

## 2019-10-27 NOTE — Patient Instructions (Signed)
Fluorouracil, 5-FU injection What is this medicine? FLUOROURACIL, 5-FU (flure oh YOOR a sil) is a chemotherapy drug. It slows the growth of cancer cells. This medicine is used to treat many types of cancer like breast cancer, colon or rectal cancer, pancreatic cancer, and stomach cancer. This medicine may be used for other purposes; ask your health care provider or pharmacist if you have questions. COMMON BRAND NAME(S): Adrucil What should I tell my health care provider before I take this medicine? They need to know if you have any of these conditions:  blood disorders  dihydropyrimidine dehydrogenase (DPD) deficiency  infection (especially a virus infection such as chickenpox, cold sores, or herpes)  kidney disease  liver disease  malnourished, poor nutrition  recent or ongoing radiation therapy  an unusual or allergic reaction to fluorouracil, other chemotherapy, other medicines, foods, dyes, or preservatives  pregnant or trying to get pregnant  breast-feeding How should I use this medicine? This drug is given as an infusion or injection into a vein. It is administered in a hospital or clinic by a specially trained health care professional. Talk to your pediatrician regarding the use of this medicine in children. Special care may be needed. Overdosage: If you think you have taken too much of this medicine contact a poison control center or emergency room at once. NOTE: This medicine is only for you. Do not share this medicine with others. What if I miss a dose? It is important not to miss your dose. Call your doctor or health care professional if you are unable to keep an appointment. What may interact with this medicine?  allopurinol  cimetidine  dapsone  digoxin  hydroxyurea  leucovorin  levamisole  medicines for seizures like ethotoin, fosphenytoin, phenytoin  medicines to increase blood counts like filgrastim, pegfilgrastim, sargramostim  medicines that  treat or prevent blood clots like warfarin, enoxaparin, and dalteparin  methotrexate  metronidazole  pyrimethamine  some other chemotherapy drugs like busulfan, cisplatin, estramustine, vinblastine  trimethoprim  trimetrexate  vaccines Talk to your doctor or health care professional before taking any of these medicines:  acetaminophen  aspirin  ibuprofen  ketoprofen  naproxen This list may not describe all possible interactions. Give your health care provider a list of all the medicines, herbs, non-prescription drugs, or dietary supplements you use. Also tell them if you smoke, drink alcohol, or use illegal drugs. Some items may interact with your medicine. What should I watch for while using this medicine? Visit your doctor for checks on your progress. This drug may make you feel generally unwell. This is not uncommon, as chemotherapy can affect healthy cells as well as cancer cells. Report any side effects. Continue your course of treatment even though you feel ill unless your doctor tells you to stop. In some cases, you may be given additional medicines to help with side effects. Follow all directions for their use. Call your doctor or health care professional for advice if you get a fever, chills or sore throat, or other symptoms of a cold or flu. Do not treat yourself. This drug decreases your body's ability to fight infections. Try to avoid being around people who are sick. This medicine may increase your risk to bruise or bleed. Call your doctor or health care professional if you notice any unusual bleeding. Be careful brushing and flossing your teeth or using a toothpick because you may get an infection or bleed more easily. If you have any dental work done, tell your dentist you are  receiving this medicine. Avoid taking products that contain aspirin, acetaminophen, ibuprofen, naproxen, or ketoprofen unless instructed by your doctor. These medicines may hide a fever. Do not  become pregnant while taking this medicine. Women should inform their doctor if they wish to become pregnant or think they might be pregnant. There is a potential for serious side effects to an unborn child. Talk to your health care professional or pharmacist for more information. Do not breast-feed an infant while taking this medicine. Men should inform their doctor if they wish to father a child. This medicine may lower sperm counts. Do not treat diarrhea with over the counter products. Contact your doctor if you have diarrhea that lasts more than 2 days or if it is severe and watery. This medicine can make you more sensitive to the sun. Keep out of the sun. If you cannot avoid being in the sun, wear protective clothing and use sunscreen. Do not use sun lamps or tanning beds/booths. What side effects may I notice from receiving this medicine? Side effects that you should report to your doctor or health care professional as soon as possible:  allergic reactions like skin rash, itching or hives, swelling of the face, lips, or tongue  low blood counts - this medicine may decrease the number of white blood cells, red blood cells and platelets. You may be at increased risk for infections and bleeding.  signs of infection - fever or chills, cough, sore throat, pain or difficulty passing urine  signs of decreased platelets or bleeding - bruising, pinpoint red spots on the skin, black, tarry stools, blood in the urine  signs of decreased red blood cells - unusually weak or tired, fainting spells, lightheadedness  breathing problems  changes in vision  chest pain  mouth sores  nausea and vomiting  pain, swelling, redness at site where injected  pain, tingling, numbness in the hands or feet  redness, swelling, or sores on hands or feet  stomach pain  unusual bleeding Side effects that usually do not require medical attention (report to your doctor or health care professional if they  continue or are bothersome):  changes in finger or toe nails  diarrhea  dry or itchy skin  hair loss  headache  loss of appetite  sensitivity of eyes to the light  stomach upset  unusually teary eyes This list may not describe all possible side effects. Call your doctor for medical advice about side effects. You may report side effects to FDA at 1-800-FDA-1088. Where should I keep my medicine? This drug is given in a hospital or clinic and will not be stored at home. NOTE: This sheet is a summary. It may not cover all possible information. If you have questions about this medicine, talk to your doctor, pharmacist, or health care provider.  2020 [employer]/Gold Standard (2007-12-24 13:53:16) Leucovorin injection What is this medicine? LEUCOVORIN (loo koe VOR in) is used to prevent or treat the harmful effects of some medicines. This medicine is used to treat anemia caused by a low amount of folic acid in the body. It is also used with 5-fluorouracil (5-FU) to treat colon cancer. This medicine may be used for other purposes; ask your health care provider or pharmacist if you have questions. What should I tell my health care provider before I take this medicine? They need to know if you have any of these conditions:  anemia from low levels of vitamin B-12 in the blood  an unusual or allergic reaction to  leucovorin, folic acid, other medicines, foods, dyes, or preservatives  pregnant or trying to get pregnant  breast-feeding How should I use this medicine? This medicine is for injection into a muscle or into a vein. It is given by a health care professional in a hospital or clinic setting. Talk to your pediatrician regarding the use of this medicine in children. Special care may be needed. Overdosage: If you think you have taken too much of this medicine contact a poison control center or emergency room at once. NOTE: This medicine is only for you. Do not share this medicine with  others. What if I miss a dose? This does not apply. What may interact with this medicine?  capecitabine  fluorouracil  phenobarbital  phenytoin  primidone  trimethoprim-sulfamethoxazole This list may not describe all possible interactions. Give your health care provider a list of all the medicines, herbs, non-prescription drugs, or dietary supplements you use. Also tell them if you smoke, drink alcohol, or use illegal drugs. Some items may interact with your medicine. What should I watch for while using this medicine? Your condition will be monitored carefully while you are receiving this medicine. This medicine may increase the side effects of 5-fluorouracil, 5-FU. Tell your doctor or health care professional if you have diarrhea or mouth sores that do not get better or that get worse. What side effects may I notice from receiving this medicine? Side effects that you should report to your doctor or health care professional as soon as possible:  allergic reactions like skin rash, itching or hives, swelling of the face, lips, or tongue  breathing problems  fever, infection  mouth sores  unusual bleeding or bruising  unusually weak or tired Side effects that usually do not require medical attention (report to your doctor or health care professional if they continue or are bothersome):  constipation or diarrhea  loss of appetite  nausea, vomiting This list may not describe all possible side effects. Call your doctor for medical advice about side effects. You may report side effects to FDA at 1-800-FDA-1088. Where should I keep my medicine? This drug is given in a hospital or clinic and will not be stored at home. NOTE: This sheet is a summary. It may not cover all possible information. If you have questions about this medicine, talk to your doctor, pharmacist, or health care provider.  2020 [employer]/Gold Standard (2008-02-24 16:50:29) Oxaliplatin Injection What is this  medicine? OXALIPLATIN (ox AL i PLA tin) is a chemotherapy drug. It targets fast dividing cells, like cancer cells, and causes these cells to die. This medicine is used to treat cancers of the colon and rectum, and many other cancers. This medicine may be used for other purposes; ask your health care provider or pharmacist if you have questions. COMMON BRAND NAME(S): Eloxatin What should I tell my health care provider before I take this medicine? They need to know if you have any of these conditions:  heart disease  history of irregular heartbeat  liver disease  low blood counts, like white cells, platelets, or red blood cells  lung or breathing disease, like asthma  take medicines that treat or prevent blood clots  tingling of the fingers or toes, or other nerve disorder  an unusual or allergic reaction to oxaliplatin, other chemotherapy, other medicines, foods, dyes, or preservatives  pregnant or trying to get pregnant  breast-feeding How should I use this medicine? This drug is given as an infusion into a vein. It is administered  in a hospital or clinic by a specially trained health care professional. Talk to your pediatrician regarding the use of this medicine in children. Special care may be needed. Overdosage: If you think you have taken too much of this medicine contact a poison control center or emergency room at once. NOTE: This medicine is only for you. Do not share this medicine with others. What if I miss a dose? It is important not to miss a dose. Call your doctor or health care professional if you are unable to keep an appointment. What may interact with this medicine? Do not take this medicine with any of the following medications:  cisapride  dronedarone  pimozide  thioridazine This medicine may also interact with the following medications:  aspirin and aspirin-like medicines  certain medicines that treat or prevent blood clots like warfarin, apixaban,  dabigatran, and rivaroxaban  cisplatin  cyclosporine  diuretics  medicines for infection like acyclovir, adefovir, amphotericin B, bacitracin, cidofovir, foscarnet, ganciclovir, gentamicin, pentamidine, vancomycin  NSAIDs, medicines for pain and inflammation, like ibuprofen or naproxen  other medicines that prolong the QT interval (an abnormal heart rhythm)  pamidronate  zoledronic acid This list may not describe all possible interactions. Give your health care provider a list of all the medicines, herbs, non-prescription drugs, or dietary supplements you use. Also tell them if you smoke, drink alcohol, or use illegal drugs. Some items may interact with your medicine. What should I watch for while using this medicine? Your condition will be monitored carefully while you are receiving this medicine. You may need blood work done while you are taking this medicine. This medicine may make you feel generally unwell. This is not uncommon as chemotherapy can affect healthy cells as well as cancer cells. Report any side effects. Continue your course of treatment even though you feel ill unless your healthcare professional tells you to stop. This medicine can make you more sensitive to cold. Do not drink cold drinks or use ice. Cover exposed skin before coming in contact with cold temperatures or cold objects. When out in cold weather wear warm clothing and cover your mouth and nose to warm the air that goes into your lungs. Tell your doctor if you get sensitive to the cold. Do not become pregnant while taking this medicine or for 9 months after stopping it. Women should inform their health care professional if they wish to become pregnant or think they might be pregnant. Men should not father a child while taking this medicine and for 6 months after stopping it. There is potential for serious side effects to an unborn child. Talk to your health care professional for more information. Do not  breast-feed a child while taking this medicine or for 3 months after stopping it. This medicine has caused ovarian failure in some women. This medicine may make it more difficult to get pregnant. Talk to your health care professional if you are concerned about your fertility. This medicine has caused decreased sperm counts in some men. This may make it more difficult to father a child. Talk to your health care professional if you are concerned about your fertility. This medicine may increase your risk of getting an infection. Call your health care professional for advice if you get a fever, chills, or sore throat, or other symptoms of a cold or flu. Do not treat yourself. Try to avoid being around people who are sick. Avoid taking medicines that contain aspirin, acetaminophen, ibuprofen, naproxen, or ketoprofen unless instructed by  your health care professional. These medicines may hide a fever. Be careful brushing or flossing your teeth or using a toothpick because you may get an infection or bleed more easily. If you have any dental work done, tell your dentist you are receiving this medicine. What side effects may I notice from receiving this medicine? Side effects that you should report to your doctor or health care professional as soon as possible:  allergic reactions like skin rash, itching or hives, swelling of the face, lips, or tongue  breathing problems  cough  low blood counts - this medicine may decrease the number of white blood cells, red blood cells, and platelets. You may be at increased risk for infections and bleeding  nausea, vomiting  pain, redness, or irritation at site where injected  pain, tingling, numbness in the hands or feet  signs and symptoms of bleeding such as bloody or black, tarry stools; red or dark brown urine; spitting up blood or brown material that looks like coffee grounds; red spots on the skin; unusual bruising or bleeding from the eyes, gums, or  nose  signs and symptoms of a dangerous change in heartbeat or heart rhythm like chest pain; dizziness; fast, irregular heartbeat; palpitations; feeling faint or lightheaded; falls  signs and symptoms of infection like fever; chills; cough; sore throat; pain or trouble passing urine  signs and symptoms of liver injury like dark yellow or brown urine; general ill feeling or flu-like symptoms; light-colored stools; loss of appetite; nausea; right upper belly pain; unusually weak or tired; yellowing of the eyes or skin  signs and symptoms of low red blood cells or anemia such as unusually weak or tired; feeling faint or lightheaded; falls  signs and symptoms of muscle injury like dark urine; trouble passing urine or change in the amount of urine; unusually weak or tired; muscle pain; back pain Side effects that usually do not require medical attention (report to your doctor or health care professional if they continue or are bothersome):  changes in taste  diarrhea  gas  hair loss  loss of appetite  mouth sores This list may not describe all possible side effects. Call your doctor for medical advice about side effects. You may report side effects to FDA at 1-800-FDA-1088. Where should I keep my medicine? This drug is given in a hospital or clinic and will not be stored at home. NOTE: This sheet is a summary. It may not cover all possible information. If you have questions about this medicine, talk to your doctor, pharmacist, or health care provider.  2020 [employer] Standard (2019-01-07 12:20:35) Irinotecan injection What is this medicine? IRINOTECAN (ir in oh TEE kan ) is a chemotherapy drug. It is used to treat colon and rectal cancer. This medicine may be used for other purposes; ask your health care provider or pharmacist if you have questions. COMMON BRAND NAME(S): Camptosar What should I tell my health care provider before I take this medicine? They need to know if you have  any of these conditions:  dehydration  diarrhea  infection (especially a virus infection such as chickenpox, cold sores, or herpes)  liver disease  low blood counts, like low white cell, platelet, or red cell counts  low levels of calcium, magnesium, or potassium in the blood  recent or ongoing radiation therapy  an unusual or allergic reaction to irinotecan, other medicines, foods, dyes, or preservatives  pregnant or trying to get pregnant  breast-feeding How should I use this medicine?  This drug is given as an infusion into a vein. It is administered in a hospital or clinic by a specially trained health care professional. Talk to your pediatrician regarding the use of this medicine in children. Special care may be needed. Overdosage: If you think you have taken too much of this medicine contact a poison control center or emergency room at once. NOTE: This medicine is only for you. Do not share this medicine with others. What if I miss a dose? It is important not to miss your dose. Call your doctor or health care professional if you are unable to keep an appointment. What may interact with this medicine? This medicine may interact with the following medications:  antiviral medicines for HIV or AIDS  certain antibiotics like rifampin or rifabutin  certain medicines for fungal infections like itraconazole, ketoconazole, posaconazole, and voriconazole  certain medicines for seizures like carbamazepine, phenobarbital, phenotoin  clarithromycin  gemfibrozil  nefazodone  St. John's Wort This list may not describe all possible interactions. Give your health care provider a list of all the medicines, herbs, non-prescription drugs, or dietary supplements you use. Also tell them if you smoke, drink alcohol, or use illegal drugs. Some items may interact with your medicine. What should I watch for while using this medicine? Your condition will be monitored carefully while you are  receiving this medicine. You will need important blood work done while you are taking this medicine. This drug may make you feel generally unwell. This is not uncommon, as chemotherapy can affect healthy cells as well as cancer cells. Report any side effects. Continue your course of treatment even though you feel ill unless your doctor tells you to stop. In some cases, you may be given additional medicines to help with side effects. Follow all directions for their use. You may get drowsy or dizzy. Do not drive, use machinery, or do anything that needs mental alertness until you know how this medicine affects you. Do not stand or sit up quickly, especially if you are an older patient. This reduces the risk of dizzy or fainting spells. Call your health care professional for advice if you get a fever, chills, or sore throat, or other symptoms of a cold or flu. Do not treat yourself. This medicine decreases your body's ability to fight infections. Try to avoid being around people who are sick. Avoid taking products that contain aspirin, acetaminophen, ibuprofen, naproxen, or ketoprofen unless instructed by your doctor. These medicines may hide a fever. This medicine may increase your risk to bruise or bleed. Call your doctor or health care professional if you notice any unusual bleeding. Be careful brushing and flossing your teeth or using a toothpick because you may get an infection or bleed more easily. If you have any dental work done, tell your dentist you are receiving this medicine. Do not become pregnant while taking this medicine or for 6 months after stopping it. Women should inform their health care professional if they wish to become pregnant or think they might be pregnant. Men should not father a child while taking this medicine and for 3 months after stopping it. There is potential for serious side effects to an unborn child. Talk to your health care professional for more information. Do not  breast-feed an infant while taking this medicine or for 7 days after stopping it. This medicine has caused ovarian failure in some women. This medicine may make it more difficult to get pregnant. Talk to your health care professional  if you are concerned about your fertility. This medicine has caused decreased sperm counts in some men. This may make it more difficult to father a child. Talk to your health care professional if you are concerned about your fertility. What side effects may I notice from receiving this medicine? Side effects that you should report to your doctor or health care professional as soon as possible:  allergic reactions like skin rash, itching or hives, swelling of the face, lips, or tongue  chest pain  diarrhea  flushing, runny nose, sweating during infusion  low blood counts - this medicine may decrease the number of white blood cells, red blood cells and platelets. You may be at increased risk for infections and bleeding.  nausea, vomiting  pain, swelling, warmth in the leg  signs of decreased platelets or bleeding - bruising, pinpoint red spots on the skin, black, tarry stools, blood in the urine  signs of infection - fever or chills, cough, sore throat, pain or difficulty passing urine  signs of decreased red blood cells - unusually weak or tired, fainting spells, lightheadedness Side effects that usually do not require medical attention (report to your doctor or health care professional if they continue or are bothersome):  constipation  hair loss  headache  loss of appetite  mouth sores  stomach pain This list may not describe all possible side effects. Call your doctor for medical advice about side effects. You may report side effects to FDA at 1-800-FDA-1088. Where should I keep my medicine? This drug is given in a hospital or clinic and will not be stored at home. NOTE: This sheet is a summary. It may not cover all possible information. If you  have questions about this medicine, talk to your doctor, pharmacist, or health care provider.  2020 [employer]/Gold Standard (2018-10-10 10:09:17)

## 2019-10-28 ENCOUNTER — Encounter: Payer: Self-pay | Admitting: Hematology & Oncology

## 2019-10-28 LAB — CANCER ANTIGEN 19-9: CA 19-9: 3072 U/mL — ABNORMAL HIGH (ref 0–35)

## 2019-10-28 MED ORDER — LORAZEPAM 0.5 MG PO TABS: 0.5000 mg | ORAL_TABLET | Freq: Four times a day (QID) | ORAL | 0 refills | Status: DC | PRN

## 2019-10-29 ENCOUNTER — Other Ambulatory Visit: Payer: Self-pay

## 2019-10-29 ENCOUNTER — Inpatient Hospital Stay: Payer: BC Managed Care – PPO

## 2019-10-29 VITALS — BP 103/79 | HR 70 | Temp 97.1°F | Resp 18

## 2019-10-29 DIAGNOSIS — Z79899 Other long term (current) drug therapy: Secondary | ICD-10-CM | POA: Diagnosis not present

## 2019-10-29 DIAGNOSIS — Z7689 Persons encountering health services in other specified circumstances: Secondary | ICD-10-CM | POA: Diagnosis not present

## 2019-10-29 DIAGNOSIS — C259 Malignant neoplasm of pancreas, unspecified: Secondary | ICD-10-CM

## 2019-10-29 DIAGNOSIS — E876 Hypokalemia: Secondary | ICD-10-CM | POA: Diagnosis not present

## 2019-10-29 DIAGNOSIS — C787 Secondary malignant neoplasm of liver and intrahepatic bile duct: Secondary | ICD-10-CM | POA: Diagnosis not present

## 2019-10-29 DIAGNOSIS — C250 Malignant neoplasm of head of pancreas: Secondary | ICD-10-CM | POA: Diagnosis not present

## 2019-10-29 DIAGNOSIS — K5900 Constipation, unspecified: Secondary | ICD-10-CM | POA: Diagnosis not present

## 2019-10-29 DIAGNOSIS — R5383 Other fatigue: Secondary | ICD-10-CM | POA: Diagnosis not present

## 2019-10-29 DIAGNOSIS — R739 Hyperglycemia, unspecified: Secondary | ICD-10-CM | POA: Diagnosis not present

## 2019-10-29 DIAGNOSIS — G893 Neoplasm related pain (acute) (chronic): Secondary | ICD-10-CM | POA: Diagnosis not present

## 2019-10-29 DIAGNOSIS — Z5111 Encounter for antineoplastic chemotherapy: Secondary | ICD-10-CM | POA: Diagnosis not present

## 2019-10-29 MED ORDER — HEPARIN SOD (PORK) LOCK FLUSH 100 UNIT/ML IV SOLN
500.0000 [IU] | Freq: Once | INTRAVENOUS | Status: AC | PRN
  Administered 2019-10-29: 500 [IU]
  Filled 2019-10-29: qty 5

## 2019-10-29 MED ORDER — PEGFILGRASTIM-JMDB 6 MG/0.6ML ~~LOC~~ SOSY
6.0000 mg | PREFILLED_SYRINGE | Freq: Once | SUBCUTANEOUS | Status: AC
  Administered 2019-10-29: 6 mg via SUBCUTANEOUS

## 2019-10-29 MED ORDER — PEGFILGRASTIM-JMDB 6 MG/0.6ML ~~LOC~~ SOSY
PREFILLED_SYRINGE | SUBCUTANEOUS | Status: AC
  Filled 2019-10-29: qty 0.6

## 2019-10-29 MED ORDER — SODIUM CHLORIDE 0.9% FLUSH
10.0000 mL | INTRAVENOUS | Status: DC | PRN
  Administered 2019-10-29: 10 mL
  Filled 2019-10-29: qty 10

## 2019-11-04 ENCOUNTER — Encounter: Payer: Self-pay | Admitting: Hematology & Oncology

## 2019-11-07 ENCOUNTER — Encounter: Payer: Self-pay | Admitting: Hematology & Oncology

## 2019-11-09 ENCOUNTER — Inpatient Hospital Stay: Payer: BC Managed Care – PPO

## 2019-11-09 ENCOUNTER — Telehealth: Payer: Self-pay | Admitting: *Deleted

## 2019-11-09 ENCOUNTER — Inpatient Hospital Stay: Payer: BC Managed Care – PPO | Attending: Hematology & Oncology

## 2019-11-09 ENCOUNTER — Other Ambulatory Visit: Payer: Self-pay

## 2019-11-09 ENCOUNTER — Inpatient Hospital Stay (HOSPITAL_BASED_OUTPATIENT_CLINIC_OR_DEPARTMENT_OTHER): Payer: BC Managed Care – PPO | Admitting: Hematology & Oncology

## 2019-11-09 ENCOUNTER — Encounter: Payer: Self-pay | Admitting: Hematology & Oncology

## 2019-11-09 VITALS — BP 88/68 | HR 117 | Temp 96.4°F | Resp 18

## 2019-11-09 DIAGNOSIS — K5900 Constipation, unspecified: Secondary | ICD-10-CM | POA: Diagnosis not present

## 2019-11-09 DIAGNOSIS — E876 Hypokalemia: Secondary | ICD-10-CM | POA: Diagnosis not present

## 2019-11-09 DIAGNOSIS — Z79899 Other long term (current) drug therapy: Secondary | ICD-10-CM | POA: Insufficient documentation

## 2019-11-09 DIAGNOSIS — E860 Dehydration: Secondary | ICD-10-CM | POA: Insufficient documentation

## 2019-11-09 DIAGNOSIS — Z5111 Encounter for antineoplastic chemotherapy: Secondary | ICD-10-CM | POA: Insufficient documentation

## 2019-11-09 DIAGNOSIS — R17 Unspecified jaundice: Secondary | ICD-10-CM | POA: Insufficient documentation

## 2019-11-09 DIAGNOSIS — Z7952 Long term (current) use of systemic steroids: Secondary | ICD-10-CM | POA: Insufficient documentation

## 2019-11-09 DIAGNOSIS — C250 Malignant neoplasm of head of pancreas: Secondary | ICD-10-CM | POA: Insufficient documentation

## 2019-11-09 DIAGNOSIS — C787 Secondary malignant neoplasm of liver and intrahepatic bile duct: Secondary | ICD-10-CM | POA: Insufficient documentation

## 2019-11-09 DIAGNOSIS — G893 Neoplasm related pain (acute) (chronic): Secondary | ICD-10-CM | POA: Insufficient documentation

## 2019-11-09 DIAGNOSIS — R978 Other abnormal tumor markers: Secondary | ICD-10-CM | POA: Diagnosis not present

## 2019-11-09 DIAGNOSIS — K591 Functional diarrhea: Secondary | ICD-10-CM

## 2019-11-09 DIAGNOSIS — R197 Diarrhea, unspecified: Secondary | ICD-10-CM | POA: Insufficient documentation

## 2019-11-09 DIAGNOSIS — C259 Malignant neoplasm of pancreas, unspecified: Secondary | ICD-10-CM

## 2019-11-09 LAB — CMP (CANCER CENTER ONLY)
ALT: 35 U/L (ref 0–44)
AST: 22 U/L (ref 15–41)
Albumin: 3.8 g/dL (ref 3.5–5.0)
Alkaline Phosphatase: 203 U/L — ABNORMAL HIGH (ref 38–126)
Anion gap: 14 (ref 5–15)
BUN: 8 mg/dL (ref 8–23)
CO2: 22 mmol/L (ref 22–32)
Calcium: 8.6 mg/dL — ABNORMAL LOW (ref 8.9–10.3)
Chloride: 96 mmol/L — ABNORMAL LOW (ref 98–111)
Creatinine: 0.85 mg/dL (ref 0.61–1.24)
GFR, Est AFR Am: >60 mL/min (ref >60)
GFR, Estimated: >60 mL/min (ref >60)
Glucose, Bld: 182 mg/dL — ABNORMAL HIGH (ref 70–99)
Potassium: 2.5 mmol/L — CL (ref 3.5–5.1)
Sodium: 132 mmol/L — ABNORMAL LOW (ref 135–145)
Total Bilirubin: 0.4 mg/dL (ref 0.3–1.2)
Total Protein: 6.9 g/dL (ref 6.5–8.1)

## 2019-11-09 LAB — CBC WITH DIFFERENTIAL (CANCER CENTER ONLY)
Abs Immature Granulocytes: 0.22 10*3/uL — ABNORMAL HIGH (ref 0.00–0.07)
Basophils Absolute: 0.1 10*3/uL (ref 0.0–0.1)
Basophils Relative: 1 %
Eosinophils Absolute: 0 10*3/uL (ref 0.0–0.5)
Eosinophils Relative: 0 %
HCT: 28.5 % — ABNORMAL LOW (ref 39.0–52.0)
Hemoglobin: 9.7 g/dL — ABNORMAL LOW (ref 13.0–17.0)
Immature Granulocytes: 2 %
Lymphocytes Relative: 25 %
Lymphs Abs: 2.7 10*3/uL (ref 0.7–4.0)
MCH: 33.3 pg (ref 26.0–34.0)
MCHC: 34 g/dL (ref 30.0–36.0)
MCV: 97.9 fL (ref 80.0–100.0)
Monocytes Absolute: 1.1 10*3/uL — ABNORMAL HIGH (ref 0.1–1.0)
Monocytes Relative: 11 %
Neutro Abs: 6.4 10*3/uL (ref 1.7–7.7)
Neutrophils Relative %: 61 %
Platelet Count: 170 10*3/uL (ref 150–400)
RBC: 2.91 MIL/uL — ABNORMAL LOW (ref 4.22–5.81)
RDW: 17.1 % — ABNORMAL HIGH (ref 11.5–15.5)
WBC Count: 10.5 10*3/uL (ref 4.0–10.5)
nRBC: 0.7 % — ABNORMAL HIGH (ref 0.0–0.2)

## 2019-11-09 MED ORDER — SODIUM CHLORIDE 0.9 % IV SOLN
1000.0000 mL | Freq: Once | INTRAVENOUS | Status: AC
  Administered 2019-11-09: 1000 mL via INTRAVENOUS
  Filled 2019-11-09: qty 1000

## 2019-11-09 MED ORDER — HEPARIN SOD (PORK) LOCK FLUSH 100 UNIT/ML IV SOLN
500.0000 [IU] | Freq: Once | INTRAVENOUS | Status: AC
  Administered 2019-11-09: 500 [IU] via INTRAVENOUS
  Filled 2019-11-09: qty 5

## 2019-11-09 MED ORDER — SODIUM CHLORIDE 0.9 % IV SOLN
Freq: Once | INTRAVENOUS | Status: AC
  Filled 2019-11-09: qty 15

## 2019-11-09 MED ORDER — POTASSIUM CHLORIDE CRYS ER 20 MEQ PO TBCR: 40.0000 meq | EXTENDED_RELEASE_TABLET | Freq: Two times a day (BID) | ORAL | 1 refills | Status: DC

## 2019-11-09 MED ORDER — SODIUM CHLORIDE 0.9% FLUSH
10.0000 mL | INTRAVENOUS | Status: DC | PRN
  Administered 2019-11-09: 10 mL via INTRAVENOUS
  Filled 2019-11-09: qty 10

## 2019-11-09 MED ORDER — METRONIDAZOLE 500 MG PO TABS: 500.0000 mg | ORAL_TABLET | Freq: Three times a day (TID) | ORAL | 0 refills | Status: DC

## 2019-11-09 MED ORDER — DIPHENOXYLATE-ATROPINE 2.5-0.025 MG PO TABS: 1.0000 | ORAL_TABLET | Freq: Four times a day (QID) | ORAL | 0 refills | Status: DC | PRN

## 2019-11-09 NOTE — Patient Instructions (Signed)
Dehydration, Adult Dehydration is a condition in which there is not enough water or other fluids in the body. This happens when a person loses more fluids than he or she takes in. Important organs, such as the kidneys, brain, and heart, cannot function without a proper amount of fluids. Any loss of fluids from the body can lead to dehydration. Dehydration can be mild, moderate, or severe. It should be treated right away to prevent it from becoming severe. What are the causes? Dehydration may be caused by:  Conditions that cause loss of water or other fluids, such as diarrhea, vomiting, or sweating or urinating a lot.  Not drinking enough fluids, especially when you are ill or doing activities that require a lot of energy.  Other illnesses and conditions, such as fever or infection.  Certain medicines, such as medicines that remove excess fluid from the body (diuretics).  Lack of safe drinking water.  Not being able to get enough water and food. What increases the risk? The following factors may make you more likely to develop this condition:  Having a long-term (chronic) illness that has not been treated properly, such as diabetes, heart disease, or kidney disease.  Being 65 years of age or older.  Having a disability.  Living in a place that is high in altitude, where thinner, drier air causes more fluid loss.  Doing exercises that put stress on your body for a long time (endurance sports). What are the signs or symptoms? Symptoms of dehydration depend on how severe it is. Mild or moderate dehydration  Thirst.  Dry lips or dry mouth.  Dizziness or light-headedness, especially when standing up from a seated position.  Muscle cramps.  Dark urine. Urine may be the color of tea.  Less urine or tears produced than usual.  Headache. Severe dehydration  Changes in skin. Your skin may be cold and clammy, blotchy, or pale. Your skin also may not return to normal after being  lightly pinched and released.  Little or no tears, urine, or sweat.  Changes in vital signs, such as rapid breathing and low blood pressure. Your pulse may be weak or may be faster than 100 beats a minute when you are sitting still.  Other changes, such as: ? Feeling very thirsty. ? Sunken eyes. ? Cold hands and feet. ? Confusion. ? Being very tired (lethargic) or having trouble waking from sleep. ? Short-term weight loss. ? Loss of consciousness. How is this diagnosed? This condition is diagnosed based on your symptoms and a physical exam. You may have blood and urine tests to help confirm the diagnosis. How is this treated? Treatment for this condition depends on how severe it is. Treatment should be started right away. Do not wait until dehydration becomes severe. Severe dehydration is an emergency and needs to be treated in a hospital.  Mild or moderate dehydration can be treated at home. You may be asked to: ? Drink more fluids. ? Drink an oral rehydration solution (ORS). This drink helps restore proper amounts of fluids and salts and minerals in the blood (electrolytes).  Severe dehydration can be treated: ? With IV fluids. ? By correcting abnormal levels of electrolytes. This is often done by giving electrolytes through a tube that is passed through your nose and into your stomach (nasogastric tube, or NG tube). ? By treating the underlying cause of dehydration. Follow these instructions at home: Oral rehydration solution If told by your health care provider, drink an ORS:  Make   an ORS by following instructions on the package.  Start by drinking small amounts, about  cup (120 mL) every 5-10 minutes.  Slowly increase how much you drink until you have taken the amount recommended by your health care provider. Eating and drinking         Drink enough clear fluid to keep your urine pale yellow. If you were told to drink an ORS, finish the ORS first and then start slowly  drinking other clear fluids. Drink fluids such as: ? Water. Do not drink only water. Doing that can lead to hyponatremia, which is having too little salt (sodium) in the body. ? Water from ice chips you suck on. ? Fruit juice that you have added water to (diluted fruit juice). ? Low-calorie sports drinks.  Eat foods that contain a healthy balance of electrolytes, such as bananas, oranges, potatoes, tomatoes, and spinach.  Do not drink alcohol.  Avoid the following: ? Drinks that contain a lot of sugar. These include high-calorie sports drinks, fruit juice that is not diluted, and soda. ? Caffeine. ? Foods that are greasy or contain a lot of fat or sugar. General instructions  Take over-the-counter and prescription medicines only as told by your health care provider.  Do not take sodium tablets. Doing that can lead to having too much sodium in the body (hypernatremia).  Return to your normal activities as told by your health care provider. Ask your health care provider what activities are safe for you.  Keep all follow-up visits as told by your health care provider. This is important. Contact a health care provider if:  You have muscle cramps, pain, or discomfort, such as: ? Pain in your abdomen and the pain gets worse or stays in one area (localizes). ? Stiff neck.  You have a rash.  You are more irritable than usual.  You are sleepier or have a harder time waking than usual.  You feel weak or dizzy.  You feel very thirsty. Get help right away if you have:  Any symptoms of severe dehydration.  Symptoms of vomiting, such as: ? You cannot eat or drink without vomiting. ? Vomiting gets worse or does not go away. ? Vomit includes blood or green matter (bile).  Symptoms that get worse with treatment.  A fever.  A severe headache.  Problems with urination or bowel movements, such as: ? Diarrhea that gets worse or does not go away. ? Blood in your stool (feces). This  may cause stool to look black and tarry. ? Not urinating, or urinating only a small amount of very dark urine, within 6-8 hours.  Trouble breathing. These symptoms may represent a serious problem that is an emergency. Do not wait to see if the symptoms will go away. Get medical help right away. Call your local emergency services (911 in the [place] Do not drive yourself to the hospital. Summary  Dehydration is a condition in which there is not enough water or other fluids in the body. This happens when a person loses more fluids than he or she takes in.  Treatment for this condition depends on how severe it is. Treatment should be started right away. Do not wait until dehydration becomes severe.  Drink enough clear fluid to keep your urine pale yellow. If you were told to drink an oral rehydration solution (ORS), finish the ORS first and then start slowly drinking other clear fluids.  Take over-the-counter and prescription medicines only as told by your health care   provider.  Get help right away if you have any symptoms of severe dehydration. This information is not intended to replace advice given to you by your health care provider. Make sure you discuss any questions you have with your health care provider. Document Revised: 04/02/2019 Document Reviewed: 04/02/2019 Elsevier Patient Education  2020 [employer]   

## 2019-11-09 NOTE — Progress Notes (Signed)
Hematology and Oncology Follow Up Visit  Jeffrey Harris [id number] [birth year] 64 y.o. 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  He is post going tomorrow for chemotherapy.  On force, he has been having a lot of diarrhea.  He had been having constipation.  We had him on lactulose.  I think he was also taking some MiraLAX.  He says he is going at least 10 times a day.  He has had no abdominal pain.  He is lost a pound since we last saw him.  He has had no melena or hematochezia.  I told him to stop the lactulose.  I told not to take any MiraLAX.  He is trying some Imodium.  This really is not helping.  We will try him on some Lomotil.  His last CA 19-9 was down to 3072.  He stopped taking his potassium at home because he thought that was making the diarrhea worse.  His potassium today is 2.5.  There is no way that we will be able to give him chemotherapy tomorrow.  I am going to hold off treatment for a week.   He has had no fever.  He has had no rashes.  There is been no leg swelling.  His appetite has been down again because of the diarrhea.  Overall, his performance status right now is ECOG 1.    Medications:  Current Outpatient Medications:    Aspirin-Acetaminophen-Caffeine (EXCEDRIN MIGRAINE PO), Take 1 tablet by mouth daily as needed (migraine)., Disp: , Rfl:    dexamethasone (DECADRON) 4 MG tablet, Take 2 tablets (8 mg total) by mouth daily. Start the day after chemotherapy for 3 days. Take with food., Disp: 8 tablet, Rfl: 5   lipase/protease/amylase (CREON) 36000 UNITS CPEP capsule, Take 2 capsules (72,000 Units total) by mouth 3 (three) times daily before meals., Disp: 180 capsule, Rfl: 6   loperamide (IMODIUM A-D) 2 MG tablet, Take 2 at onset of diarrhea, then 1 every 2hrs until  12hr without a BM. May take 2 tab every 4hrs at bedtime. If diarrhea recurs repeat. (Patient not taking: Reported on 09/29/2019), Disp: 100 tablet, Rfl: 1   LORazepam (ATIVAN) 0.5 MG tablet, Take 1 tablet (0.5 mg total) by mouth every 6 (six) hours as needed for anxiety., Disp: 30 tablet, Rfl: 0   MELATONIN PO, Take 1 tablet by mouth daily., Disp: , Rfl:    metroNIDAZOLE (FLAGYL) 500 MG tablet, Take 1 tablet (500 mg total) by mouth 3 (three) times daily., Disp: 21 tablet, Rfl: 0   Multiple Vitamins-Minerals (CENTRUM ADULTS PO), Take 1 tablet by mouth daily., Disp: , Rfl:    ondansetron (ZOFRAN) 8 MG tablet, Take 1 tablet (8 mg total) by mouth 2 (two) times daily as needed. Start on day 3 after chemotherapy., Disp: 30 tablet, Rfl: 1   oxyCODONE-acetaminophen (PERCOCET/ROXICET) 5-325 MG tablet, Take 1 tablet by mouth every 6 (six) hours as needed for severe pain., Disp: , Rfl:    pantoprazole (PROTONIX) 40 MG tablet, TAKE 1 TABLET BY MOUTH TWICE A DAY (Patient not taking: Reported on 10/27/2019), Disp: 60 tablet, Rfl: 3   potassium chloride SA (KLOR-CON) 20 MEQ tablet, Take 2 tablets (40 mEq total) by mouth 2 (two) times daily., Disp: 60 tablet, Rfl: 1  prochlorperazine (COMPAZINE) 10 MG tablet, Take 1 tablet (10 mg total) by mouth every 6 (six) hours as needed (Nausea or vomiting). (Patient not taking: Reported on 09/29/2019), Disp: 30 tablet, Rfl: 1   zolpidem (AMBIEN) 5 MG tablet, Take 1 tablet (5 mg total) by mouth at bedtime as needed for sleep. (Patient not taking: Reported on 10/27/2019), Disp: 30 tablet, Rfl: 1  Current Facility-Administered Medications:    sodium chloride 0.9 % with potassium chloride 30 mEq, , Intravenous, Once, Volanda Napoleon, MD, Last Rate: 89 mL/hr at 11/09/19 1254, New Bag at 11/09/19 1254  Facility-Administered Medications Ordered in Other Visits:    sodium chloride flush (NS) 0.9 % injection 10 mL, 10 mL, Intravenous, PRN, Volanda Napoleon, MD, 10 mL at  09/07/19 1245  Allergies: No Known Allergies  Past Medical History, Surgical history, Social history, and Family History were reviewed and updated.  Review of Systems: Review of Systems  Constitutional: Positive for appetite change and unexpected weight change.  HENT:  Negative.   Eyes: Positive for icterus.  Respiratory: Negative.   Cardiovascular: Negative.   Gastrointestinal: Positive for abdominal pain and constipation.  Endocrine: Negative.   Genitourinary: Negative.    Musculoskeletal: Negative.   Skin: Negative.   Neurological: Negative.   Hematological: Negative.   Psychiatric/Behavioral: Negative.     Physical Exam:  temporal temperature is 96.4 F (35.8 C) (abnormal). His blood pressure is 88/68 (abnormal) and his pulse is 117 (abnormal). His respiration is 18 and oxygen saturation is 100%.   Wt Readings from Last 3 Encounters:  10/27/19 197 lb 1.3 oz (89.4 kg)  10/13/19 202 lb 1.9 oz (91.7 kg)  09/29/19 205 lb 12.8 oz (93.4 kg)    Physical Exam Vitals reviewed.  Constitutional:      Comments: Overall he is a well-developed and well-nourished white male.  He does not have scleral icterus.  He does not have any palatal jaundice.  There is no jaundice with his skin.  HENT:     Head: Normocephalic and atraumatic.  Eyes:     Pupils: Pupils are equal, round, and reactive to light.  Cardiovascular:     Rate and Rhythm: Normal rate and regular rhythm.     Heart sounds: Normal heart sounds.  Pulmonary:     Effort: Pulmonary effort is normal.     Breath sounds: Normal breath sounds.  Abdominal:     General: Bowel sounds are normal.     Palpations: Abdomen is soft.     Comments: His abdomen is soft.  He has good bowel sounds.  The area on the right side of the abdomen is fully healed.  There is a little bit of erythema.  There is no swelling.  There clearly is no exudate.  There is no palpable abdominal mass.  There is no fluid wave.  There is no palpable liver or  spleen tip.  Musculoskeletal:        General: No tenderness or deformity. Normal range of motion.     Cervical back: Normal range of motion.  Lymphadenopathy:     Cervical: No cervical adenopathy.  Skin:    General: Skin is warm and dry.     Coloration: Skin is jaundiced.     Findings: No erythema or rash.  Neurological:     Mental Status: He is alert and oriented to person, place, and time.  Psychiatric:        Behavior: Behavior normal.        Thought  Content: Thought content normal.        Judgment: Judgment normal.      Lab Results  Component Value Date   WBC 10.5 11/09/2019   HGB 9.7 (L) 11/09/2019   HCT 28.5 (L) 11/09/2019   MCV 97.9 11/09/2019   PLT 170 11/09/2019     Chemistry      Component Value Date/Time   NA 132 (L) 11/09/2019 1147   K 2.5 (LL) 11/09/2019 1147   CL 96 (L) 11/09/2019 1147   CO2 22 11/09/2019 1147   BUN 8 11/09/2019 1147   CREATININE 0.85 11/09/2019 1147      Component Value Date/Time   CALCIUM 8.6 (L) 11/09/2019 1147   ALKPHOS 203 (H) 11/09/2019 1147   AST 22 11/09/2019 1147   ALT 35 11/09/2019 1147   BILITOT 0.4 11/09/2019 1147       Impression and Plan: Mr. Bula is a 64 year old white male.  He has metastatic pancreatic cancer.  He has a very markedly elevated CA 19-9.  I feel bad about this diarrhea.  Hopefully this is just because of him taking medicine for constipation.  I am sure however that the chemotherapy is also playing a role.  We will give him IV fluids today.  He will get IV potassium.  Hopefully, he will will be able to take potassium at home which I think will be quite helpful.  We will plan to get him back in 1 week.  We will give him an extra day next week.  Get him back on the 17th.  We will see how he is doing then.  I spent about 35 minutes with him today.  This is somewhat complicated.  Thankfully, he is not neutropenic.   Volanda Napoleon, MD 3/8/20212:46 PM

## 2019-11-09 NOTE — Telephone Encounter (Signed)
Dr. Marin Olp notified of potassium-2.5.  Order received for patient to get 30 meq IV potassium today and to increase dose of oral potassium to 40 meq BID until next appt.  Pt instructed of MD orders and has no questions at this time.

## 2019-11-09 NOTE — Patient Instructions (Signed)

## 2019-11-10 ENCOUNTER — Inpatient Hospital Stay: Payer: BC Managed Care – PPO | Admitting: Hematology & Oncology

## 2019-11-10 ENCOUNTER — Ambulatory Visit: Payer: BC Managed Care – PPO | Admitting: Nutrition

## 2019-11-10 ENCOUNTER — Inpatient Hospital Stay: Payer: BC Managed Care – PPO

## 2019-11-10 ENCOUNTER — Inpatient Hospital Stay: Payer: BC Managed Care – PPO | Admitting: Nutrition

## 2019-11-10 ENCOUNTER — Encounter: Payer: Self-pay | Admitting: *Deleted

## 2019-11-10 LAB — IRON AND TIBC
Iron: 132 ug/dL (ref 42–163)
Saturation Ratios: 55 % (ref 20–55)
TIBC: 240 ug/dL (ref 202–409)
UIBC: 108 ug/dL — ABNORMAL LOW (ref 117–376)

## 2019-11-10 LAB — CANCER ANTIGEN 19-9: CA 19-9: 2130 U/mL — ABNORMAL HIGH (ref 0–35)

## 2019-11-10 LAB — FERRITIN: Ferritin: 2192 ng/mL — ABNORMAL HIGH (ref 24–336)

## 2019-11-10 LAB — ERYTHROPOIETIN: Erythropoietin: 81.4 m[IU]/mL — ABNORMAL HIGH (ref 2.6–18.5)

## 2019-11-10 NOTE — Progress Notes (Signed)
Telephone follow up completed with patient treated for metastatic pancreas cancer. Patient has lost about 17% of weight since November. Reports he is feeling better today. Noted diarrhea yesterday. He received fluids and potassium. Complains of taste alterations and decreased appetite.  Nutrition Diagnosis: Unintended weight loss continues.  Intervention: Educated patient to increase small frequent meals and snacks with high calorie, high protein foods. Educated on high potassium foods. Brief education on strategies for improving diarrhea. Encouraged oral nutrition supplements.  Monitoring, Evaluation, Goals: Patient will tolerate increased calories and protein to promote weight stabilization.  Next Visit: Will follow as needed. Patient has my contact information.

## 2019-11-12 ENCOUNTER — Inpatient Hospital Stay: Payer: BC Managed Care – PPO

## 2019-11-12 ENCOUNTER — Ambulatory Visit: Payer: BC Managed Care – PPO

## 2019-11-13 ENCOUNTER — Encounter: Payer: Self-pay | Admitting: Hematology & Oncology

## 2019-11-18 ENCOUNTER — Inpatient Hospital Stay (HOSPITAL_BASED_OUTPATIENT_CLINIC_OR_DEPARTMENT_OTHER): Payer: BC Managed Care – PPO | Admitting: Hematology & Oncology

## 2019-11-18 ENCOUNTER — Encounter: Payer: Self-pay | Admitting: Hematology & Oncology

## 2019-11-18 ENCOUNTER — Other Ambulatory Visit: Payer: Self-pay | Admitting: Hematology & Oncology

## 2019-11-18 ENCOUNTER — Inpatient Hospital Stay: Payer: BC Managed Care – PPO

## 2019-11-18 ENCOUNTER — Other Ambulatory Visit: Payer: Self-pay

## 2019-11-18 ENCOUNTER — Telehealth: Payer: Self-pay | Admitting: Hematology & Oncology

## 2019-11-18 ENCOUNTER — Encounter: Payer: Self-pay | Admitting: *Deleted

## 2019-11-18 VITALS — BP 103/85 | HR 104 | Temp 96.9°F | Resp 20 | Wt 199.0 lb

## 2019-11-18 VITALS — HR 95

## 2019-11-18 DIAGNOSIS — C787 Secondary malignant neoplasm of liver and intrahepatic bile duct: Secondary | ICD-10-CM | POA: Diagnosis not present

## 2019-11-18 DIAGNOSIS — R17 Unspecified jaundice: Secondary | ICD-10-CM | POA: Diagnosis not present

## 2019-11-18 DIAGNOSIS — C259 Malignant neoplasm of pancreas, unspecified: Secondary | ICD-10-CM

## 2019-11-18 DIAGNOSIS — Z5111 Encounter for antineoplastic chemotherapy: Secondary | ICD-10-CM | POA: Diagnosis not present

## 2019-11-18 DIAGNOSIS — R978 Other abnormal tumor markers: Secondary | ICD-10-CM | POA: Diagnosis not present

## 2019-11-18 DIAGNOSIS — E876 Hypokalemia: Secondary | ICD-10-CM | POA: Diagnosis not present

## 2019-11-18 DIAGNOSIS — G893 Neoplasm related pain (acute) (chronic): Secondary | ICD-10-CM | POA: Diagnosis not present

## 2019-11-18 DIAGNOSIS — K5900 Constipation, unspecified: Secondary | ICD-10-CM | POA: Diagnosis not present

## 2019-11-18 DIAGNOSIS — C251 Malignant neoplasm of body of pancreas: Secondary | ICD-10-CM | POA: Diagnosis not present

## 2019-11-18 DIAGNOSIS — R197 Diarrhea, unspecified: Secondary | ICD-10-CM | POA: Diagnosis not present

## 2019-11-18 DIAGNOSIS — Z7952 Long term (current) use of systemic steroids: Secondary | ICD-10-CM | POA: Diagnosis not present

## 2019-11-18 DIAGNOSIS — C250 Malignant neoplasm of head of pancreas: Secondary | ICD-10-CM | POA: Diagnosis not present

## 2019-11-18 DIAGNOSIS — E860 Dehydration: Secondary | ICD-10-CM | POA: Diagnosis not present

## 2019-11-18 DIAGNOSIS — Z79899 Other long term (current) drug therapy: Secondary | ICD-10-CM | POA: Diagnosis not present

## 2019-11-18 LAB — CMP (CANCER CENTER ONLY)
ALT: 19 U/L (ref 0–44)
AST: 24 U/L (ref 15–41)
Albumin: 3.4 g/dL — ABNORMAL LOW (ref 3.5–5.0)
Alkaline Phosphatase: 139 U/L — ABNORMAL HIGH (ref 38–126)
Anion gap: 8 (ref 5–15)
BUN: 5 mg/dL — ABNORMAL LOW (ref 8–23)
CO2: 25 mmol/L (ref 22–32)
Calcium: 8.7 mg/dL — ABNORMAL LOW (ref 8.9–10.3)
Chloride: 99 mmol/L (ref 98–111)
Creatinine: 0.56 mg/dL — ABNORMAL LOW (ref 0.61–1.24)
GFR, Est AFR Am: >60 mL/min (ref >60)
GFR, Estimated: >60 mL/min (ref >60)
Glucose, Bld: 188 mg/dL — ABNORMAL HIGH (ref 70–99)
Potassium: 5.1 mmol/L (ref 3.5–5.1)
Sodium: 132 mmol/L — ABNORMAL LOW (ref 135–145)
Total Bilirubin: 0.3 mg/dL (ref 0.3–1.2)
Total Protein: 6.4 g/dL — ABNORMAL LOW (ref 6.5–8.1)

## 2019-11-18 LAB — CBC WITH DIFFERENTIAL (CANCER CENTER ONLY)
Abs Immature Granulocytes: 0.17 10*3/uL — ABNORMAL HIGH (ref 0.00–0.07)
Basophils Absolute: 0.1 10*3/uL (ref 0.0–0.1)
Basophils Relative: 1 %
Eosinophils Absolute: 0.1 10*3/uL (ref 0.0–0.5)
Eosinophils Relative: 1 %
HCT: 29.3 % — ABNORMAL LOW (ref 39.0–52.0)
Hemoglobin: 9.5 g/dL — ABNORMAL LOW (ref 13.0–17.0)
Immature Granulocytes: 2 %
Lymphocytes Relative: 30 %
Lymphs Abs: 2.2 10*3/uL (ref 0.7–4.0)
MCH: 34.3 pg — ABNORMAL HIGH (ref 26.0–34.0)
MCHC: 32.4 g/dL (ref 30.0–36.0)
MCV: 105.8 fL — ABNORMAL HIGH (ref 80.0–100.0)
Monocytes Absolute: 1 10*3/uL (ref 0.1–1.0)
Monocytes Relative: 13 %
Neutro Abs: 3.8 10*3/uL (ref 1.7–7.7)
Neutrophils Relative %: 53 %
Platelet Count: 311 10*3/uL (ref 150–400)
RBC: 2.77 MIL/uL — ABNORMAL LOW (ref 4.22–5.81)
RDW: 18.6 % — ABNORMAL HIGH (ref 11.5–15.5)
WBC Count: 7.3 10*3/uL (ref 4.0–10.5)
nRBC: 0.5 % — ABNORMAL HIGH (ref 0.0–0.2)

## 2019-11-18 MED ORDER — DEXAMETHASONE SODIUM PHOSPHATE 10 MG/ML IJ SOLN
INTRAMUSCULAR | Status: AC
  Filled 2019-11-18: qty 1

## 2019-11-18 MED ORDER — FLUOROURACIL CHEMO INJECTION 2.5 GM/50ML
850.0000 mg | Freq: Once | INTRAVENOUS | Status: AC
  Administered 2019-11-18: 850 mg via INTRAVENOUS
  Filled 2019-11-18: qty 17

## 2019-11-18 MED ORDER — DEXAMETHASONE SODIUM PHOSPHATE 10 MG/ML IJ SOLN
10.0000 mg | Freq: Once | INTRAMUSCULAR | Status: AC
  Administered 2019-11-18: 10 mg via INTRAVENOUS

## 2019-11-18 MED ORDER — DEXTROSE 5 % IV SOLN
Freq: Once | INTRAVENOUS | Status: AC
  Filled 2019-11-18: qty 250

## 2019-11-18 MED ORDER — SODIUM CHLORIDE 0.9 % IV SOLN
150.0000 mg | Freq: Once | INTRAVENOUS | Status: AC
  Administered 2019-11-18: 150 mg via INTRAVENOUS
  Filled 2019-11-18: qty 150

## 2019-11-18 MED ORDER — ATROPINE SULFATE 1 MG/ML IJ SOLN
0.5000 mg | Freq: Once | INTRAMUSCULAR | Status: AC | PRN
  Administered 2019-11-18: 0.5 mg via INTRAVENOUS

## 2019-11-18 MED ORDER — PALONOSETRON HCL INJECTION 0.25 MG/5ML
INTRAVENOUS | Status: AC
  Filled 2019-11-18: qty 5

## 2019-11-18 MED ORDER — SODIUM CHLORIDE 0.9 % IV SOLN
5000.0000 mg | INTRAVENOUS | Status: AC
  Administered 2019-11-18: 5000 mg via INTRAVENOUS
  Filled 2019-11-18: qty 100

## 2019-11-18 MED ORDER — PALONOSETRON HCL INJECTION 0.25 MG/5ML
0.2500 mg | Freq: Once | INTRAVENOUS | Status: AC
  Administered 2019-11-18: 0.25 mg via INTRAVENOUS

## 2019-11-18 MED ORDER — OXALIPLATIN CHEMO INJECTION 100 MG/20ML
86.0000 mg/m2 | Freq: Once | INTRAVENOUS | Status: AC
  Administered 2019-11-18: 200 mg via INTRAVENOUS
  Filled 2019-11-18: qty 40

## 2019-11-18 MED ORDER — IRINOTECAN HCL CHEMO INJECTION 100 MG/5ML
180.0000 mg/m2 | Freq: Once | INTRAVENOUS | Status: AC
  Administered 2019-11-18: 420 mg via INTRAVENOUS
  Filled 2019-11-18: qty 15

## 2019-11-18 MED ORDER — LEUCOVORIN CALCIUM INJECTION 350 MG
400.0000 mg/m2 | Freq: Once | INTRAVENOUS | Status: AC
  Administered 2019-11-18: 928 mg via INTRAVENOUS
  Filled 2019-11-18: qty 46.4

## 2019-11-18 MED ORDER — ATROPINE SULFATE 1 MG/ML IJ SOLN
INTRAMUSCULAR | Status: AC
  Filled 2019-11-18: qty 1

## 2019-11-18 NOTE — Progress Notes (Signed)
Received notification from Roma Kayser that patient qualified for genetics. Spoke with patient today during his scheduled appointment. He is in agreement to proceed. Blood (Invitae) drawn and will be shipped today. Roma Kayser notified.

## 2019-11-18 NOTE — Telephone Encounter (Signed)
Appointments scheduled calendar printed per 3/17 los 

## 2019-11-18 NOTE — Progress Notes (Unsigned)
Per Dr Marin Olp, decrease KDur to 30meq bid from 56meq bid. Pt verbalizes understanding using teachback. dph

## 2019-11-18 NOTE — Patient Instructions (Signed)
[facility] Discharge Instructions for Patients Receiving Chemotherapy  Today you received the following chemotherapy agents Oxaliplatin, Irrinotecan, Leukovorin, 5FU To help prevent nausea and vomiting after your treatment, we encourage you to take your nausea medication as directred   If you develop nausea and vomiting that is not controlled by your nausea medication, call the clinic.   BELOW ARE SYMPTOMS THAT SHOULD BE REPORTED IMMEDIATELY:  *FEVER GREATER THAN 100.5 F  *CHILLS WITH OR WITHOUT FEVER  NAUSEA AND VOMITING THAT IS NOT CONTROLLED WITH YOUR NAUSEA MEDICATION  *UNUSUAL SHORTNESS OF BREATH  *UNUSUAL BRUISING OR BLEEDING  TENDERNESS IN MOUTH AND THROAT WITH OR WITHOUT PRESENCE OF ULCERS  *URINARY PROBLEMS  *BOWEL PROBLEMS  UNUSUAL RASH Items with * indicate a potential emergency and should be followed up as soon as possible.  Feel free to call the clinic should you have any questions or concerns. The clinic phone number is [phone].  Please show the [facility] at check-in to the Emergency Department and triage nurse.

## 2019-11-18 NOTE — Progress Notes (Signed)
Hematology and Oncology Follow Up Visit  Jeffrey Harris:[id number] Sep 11, 1955 64 y.o. 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 is back for for follow-up.  He looks great.  He is gaining weight.  The diarrhea is much better now.  He is eating well.  He just looks a whole lot better.  Even better is the fact that his CA 19-9 is down to 2100.  He is coming down nicely.  He is having no problems with cough or shortness of breath.  There is no rashes.  He has had no leg swelling.  He has had no nausea or vomiting.  Overall, his performance status right now is ECOG 1.    Medications:  Current Outpatient Medications:  .  dexamethasone (DECADRON) 4 MG tablet, Take 2 tablets (8 mg total) by mouth daily. Start the day after chemotherapy for 3 days. Take with food., Disp: 8 tablet, Rfl: 5 .  lipase/protease/amylase (CREON) 36000 UNITS CPEP capsule, Take 2 capsules (72,000 Units total) by mouth 3 (three) times daily before meals., Disp: 180 capsule, Rfl: 6 .  LORazepam (ATIVAN) 0.5 MG tablet, Take 1 tablet (0.5 mg total) by mouth every 6 (six) hours as needed for anxiety., Disp: 30 tablet, Rfl: 0 .  potassium chloride SA (KLOR-CON) 20 MEQ tablet, Take 2 tablets (40 mEq total) by mouth 2 (two) times daily., Disp: 60 tablet, Rfl: 1 .  Aspirin-Acetaminophen-Caffeine (EXCEDRIN MIGRAINE PO), Take 1 tablet by mouth daily as needed (migraine)., Disp: , Rfl:  .  diphenoxylate-atropine (LOMOTIL) 2.5-0.025 MG tablet, Take 1-2 tablets by mouth 4 (four) times daily as needed for diarrhea or loose stools. (Patient not taking: Reported on 11/18/2019), Disp: 100 tablet, Rfl: 0 .  loperamide (IMODIUM A-D) 2 MG tablet, Take 2 at onset of diarrhea, then 1 every 2hrs until 12hr without a BM. May take 2 tab every 4hrs at bedtime. If diarrhea recurs repeat.  (Patient not taking: Reported on 09/29/2019), Disp: 100 tablet, Rfl: 1 .  MELATONIN PO, Take 1 tablet by mouth daily., Disp: , Rfl:  .  Multiple Vitamins-Minerals (CENTRUM ADULTS PO), Take 1 tablet by mouth daily., Disp: , Rfl:  .  ondansetron (ZOFRAN) 8 MG tablet, Take 1 tablet (8 mg total) by mouth 2 (two) times daily as needed. Start on day 3 after chemotherapy. (Patient not taking: Reported on 11/18/2019), Disp: 30 tablet, Rfl: 1 .  oxyCODONE-acetaminophen (PERCOCET/ROXICET) 5-325 MG tablet, Take 1 tablet by mouth every 6 (six) hours as needed for severe pain., Disp: , Rfl:  .  pantoprazole (PROTONIX) 40 MG tablet, TAKE 1 TABLET BY MOUTH TWICE A DAY (Patient not taking: Reported on 10/27/2019), Disp: 60 tablet, Rfl: 3 .  prochlorperazine (COMPAZINE) 10 MG tablet, Take 1 tablet (10 mg total) by mouth every 6 (six) hours as needed (Nausea or vomiting). (Patient not taking: Reported on 09/29/2019), Disp: 30 tablet, Rfl: 1 .  zolpidem (AMBIEN) 5 MG tablet, Take 1 tablet (5 mg total) by mouth at bedtime as needed for sleep. (Patient not taking: Reported on 10/27/2019), Disp: 30 tablet, Rfl: 1 No current facility-administered medications for this visit.  Facility-Administered Medications Ordered in Other Visits:  .  sodium chloride flush (NS) 0.9 % injection 10 mL, 10 mL, Intravenous, PRN, Volanda Napoleon, MD, 10 mL at 09/07/19 1245  Allergies: No  Known Allergies  Past Medical History, Surgical history, Social history, and Family History were reviewed and updated.  Review of Systems: Review of Systems  Constitutional: Positive for appetite change and unexpected weight change.  HENT:  Negative.   Eyes: Positive for icterus.  Respiratory: Negative.   Cardiovascular: Negative.   Gastrointestinal: Positive for abdominal pain and constipation.  Endocrine: Negative.   Genitourinary: Negative.    Musculoskeletal: Negative.   Skin: Negative.   Neurological: Negative.   Hematological: Negative.     Psychiatric/Behavioral: Negative.     Physical Exam:  weight is 199 lb (90.3 kg). His temporal temperature is 96.9 F (36.1 C) (abnormal). His blood pressure is 103/85 and his pulse is 104 (abnormal). His respiration is 20 and oxygen saturation is 100%.   Wt Readings from Last 3 Encounters:  11/18/19 199 lb (90.3 kg)  10/27/19 197 lb 1.3 oz (89.4 kg)  10/13/19 202 lb 1.9 oz (91.7 kg)    Physical Exam Vitals reviewed.  Constitutional:      Comments: Overall he is a well-developed and well-nourished white male.  He does not have scleral icterus.  He does not have any palatal jaundice.  There is no jaundice with his skin.  HENT:     Head: Normocephalic and atraumatic.  Eyes:     Pupils: Pupils are equal, round, and reactive to light.  Cardiovascular:     Rate and Rhythm: Normal rate and regular rhythm.     Heart sounds: Normal heart sounds.  Pulmonary:     Effort: Pulmonary effort is normal.     Breath sounds: Normal breath sounds.  Abdominal:     General: Bowel sounds are normal.     Palpations: Abdomen is soft.     Comments: His abdomen is soft.  He has good bowel sounds.  The area on the right side of the abdomen is fully healed.  There is a little bit of erythema.  There is no swelling.  There clearly is no exudate.  There is no palpable abdominal mass.  There is no fluid wave.  There is no palpable liver or spleen tip.  Musculoskeletal:        General: No tenderness or deformity. Normal range of motion.     Cervical back: Normal range of motion.  Lymphadenopathy:     Cervical: No cervical adenopathy.  Skin:    General: Skin is warm and dry.     Coloration: Skin is jaundiced.     Findings: No erythema or rash.  Neurological:     Mental Status: He is alert and oriented to person, place, and time.  Psychiatric:        Behavior: Behavior normal.        Thought Content: Thought content normal.        Judgment: Judgment normal.      Lab Results  Component Value Date    WBC 7.3 11/18/2019   HGB 9.5 (L) 11/18/2019   HCT 29.3 (L) 11/18/2019   MCV 105.8 (H) 11/18/2019   PLT 311 11/18/2019     Chemistry      Component Value Date/Time   NA 132 (L) 11/09/2019 1147   K 2.5 (LL) 11/09/2019 1147   CL 96 (L) 11/09/2019 1147   CO2 22 11/09/2019 1147   BUN 8 11/09/2019 1147   CREATININE 0.85 11/09/2019 1147      Component Value Date/Time   CALCIUM 8.6 (L) 11/09/2019 1147   ALKPHOS 203 (H) 11/09/2019 1147   AST  22 11/09/2019 1147   ALT 35 11/09/2019 1147   BILITOT 0.4 11/09/2019 1147       Impression and Plan: Mr. Gutter is a 64 year old white male.  He has metastatic pancreatic cancer.  He has a very markedly elevated CA 19-9.  I I am happy that his quality of life is doing better now.  He is looking forward to the upcoming baseball season.  We talked about baseball.  We will go ahead with his seventh cycle of chemotherapy.  We will do a CT scan after the eighth cycle of treatment.  Again, quality of life has been the priority.     Volanda Napoleon, MD 3/17/20218:42 AM

## 2019-11-18 NOTE — Patient Instructions (Signed)

## 2019-11-19 ENCOUNTER — Other Ambulatory Visit: Payer: Self-pay | Admitting: *Deleted

## 2019-11-19 DIAGNOSIS — C787 Secondary malignant neoplasm of liver and intrahepatic bile duct: Secondary | ICD-10-CM

## 2019-11-19 DIAGNOSIS — C259 Malignant neoplasm of pancreas, unspecified: Secondary | ICD-10-CM

## 2019-11-20 ENCOUNTER — Inpatient Hospital Stay: Payer: BC Managed Care – PPO

## 2019-11-20 ENCOUNTER — Encounter: Payer: Self-pay | Admitting: *Deleted

## 2019-11-20 ENCOUNTER — Other Ambulatory Visit: Payer: Self-pay

## 2019-11-20 VITALS — BP 109/77 | HR 94 | Temp 96.9°F | Resp 18

## 2019-11-20 DIAGNOSIS — R197 Diarrhea, unspecified: Secondary | ICD-10-CM | POA: Diagnosis not present

## 2019-11-20 DIAGNOSIS — E860 Dehydration: Secondary | ICD-10-CM | POA: Diagnosis not present

## 2019-11-20 DIAGNOSIS — C259 Malignant neoplasm of pancreas, unspecified: Secondary | ICD-10-CM

## 2019-11-20 DIAGNOSIS — C787 Secondary malignant neoplasm of liver and intrahepatic bile duct: Secondary | ICD-10-CM

## 2019-11-20 DIAGNOSIS — E876 Hypokalemia: Secondary | ICD-10-CM | POA: Diagnosis not present

## 2019-11-20 DIAGNOSIS — R17 Unspecified jaundice: Secondary | ICD-10-CM | POA: Diagnosis not present

## 2019-11-20 DIAGNOSIS — Z7952 Long term (current) use of systemic steroids: Secondary | ICD-10-CM | POA: Diagnosis not present

## 2019-11-20 DIAGNOSIS — Z5111 Encounter for antineoplastic chemotherapy: Secondary | ICD-10-CM | POA: Diagnosis not present

## 2019-11-20 DIAGNOSIS — G893 Neoplasm related pain (acute) (chronic): Secondary | ICD-10-CM | POA: Diagnosis not present

## 2019-11-20 DIAGNOSIS — C250 Malignant neoplasm of head of pancreas: Secondary | ICD-10-CM | POA: Diagnosis not present

## 2019-11-20 DIAGNOSIS — K5900 Constipation, unspecified: Secondary | ICD-10-CM | POA: Diagnosis not present

## 2019-11-20 DIAGNOSIS — Z79899 Other long term (current) drug therapy: Secondary | ICD-10-CM | POA: Diagnosis not present

## 2019-11-20 DIAGNOSIS — R978 Other abnormal tumor markers: Secondary | ICD-10-CM | POA: Diagnosis not present

## 2019-11-20 MED ORDER — SODIUM CHLORIDE 0.9% FLUSH
10.0000 mL | INTRAVENOUS | Status: DC | PRN
  Administered 2019-11-20: 10 mL
  Filled 2019-11-20: qty 10

## 2019-11-20 MED ORDER — PEGFILGRASTIM-JMDB 6 MG/0.6ML ~~LOC~~ SOSY
6.0000 mg | PREFILLED_SYRINGE | Freq: Once | SUBCUTANEOUS | Status: AC
  Administered 2019-11-20: 6 mg via SUBCUTANEOUS

## 2019-11-20 MED ORDER — PEGFILGRASTIM-JMDB 6 MG/0.6ML ~~LOC~~ SOSY
PREFILLED_SYRINGE | SUBCUTANEOUS | Status: AC
  Filled 2019-11-20: qty 0.6

## 2019-11-20 MED ORDER — HEPARIN SOD (PORK) LOCK FLUSH 100 UNIT/ML IV SOLN
500.0000 [IU] | Freq: Once | INTRAVENOUS | Status: AC | PRN
  Administered 2019-11-20: 500 [IU]
  Filled 2019-11-20: qty 5

## 2019-11-23 ENCOUNTER — Encounter: Payer: Self-pay | Admitting: *Deleted

## 2019-11-25 NOTE — Progress Notes (Signed)
Pharmacist Chemotherapy Monitoring - Follow Up Assessment    I verify that I have reviewed each item in the below checklist:  . Regimen for the patient is scheduled for the appropriate day and plan matches scheduled date. [name] Appropriate non-routine labs are ordered dependent on drug ordered. . If applicable, additional medications reviewed and ordered per protocol based on lifetime cumulative doses and/or treatment regimen.   Plan for follow-up and/or issues identified: No . I-vent associated with next due treatment: No . MD and/or nursing notified: No  Katrice Goel, [name] 11/25/2019 8:21 AM

## 2019-12-02 ENCOUNTER — Telehealth: Payer: Self-pay | Admitting: Hematology & Oncology

## 2019-12-02 ENCOUNTER — Inpatient Hospital Stay (HOSPITAL_BASED_OUTPATIENT_CLINIC_OR_DEPARTMENT_OTHER): Payer: BC Managed Care – PPO | Admitting: Hematology & Oncology

## 2019-12-02 ENCOUNTER — Other Ambulatory Visit: Payer: Self-pay

## 2019-12-02 ENCOUNTER — Encounter: Payer: Self-pay | Admitting: Hematology & Oncology

## 2019-12-02 ENCOUNTER — Inpatient Hospital Stay: Payer: BC Managed Care – PPO

## 2019-12-02 VITALS — BP 103/74 | HR 90 | Temp 97.1°F | Resp 18 | Wt 196.1 lb

## 2019-12-02 DIAGNOSIS — C259 Malignant neoplasm of pancreas, unspecified: Secondary | ICD-10-CM | POA: Diagnosis not present

## 2019-12-02 DIAGNOSIS — C250 Malignant neoplasm of head of pancreas: Secondary | ICD-10-CM | POA: Diagnosis not present

## 2019-12-02 DIAGNOSIS — Z7952 Long term (current) use of systemic steroids: Secondary | ICD-10-CM | POA: Diagnosis not present

## 2019-12-02 DIAGNOSIS — E860 Dehydration: Secondary | ICD-10-CM | POA: Diagnosis not present

## 2019-12-02 DIAGNOSIS — K5900 Constipation, unspecified: Secondary | ICD-10-CM | POA: Diagnosis not present

## 2019-12-02 DIAGNOSIS — R17 Unspecified jaundice: Secondary | ICD-10-CM | POA: Diagnosis not present

## 2019-12-02 DIAGNOSIS — C787 Secondary malignant neoplasm of liver and intrahepatic bile duct: Secondary | ICD-10-CM

## 2019-12-02 DIAGNOSIS — E876 Hypokalemia: Secondary | ICD-10-CM | POA: Diagnosis not present

## 2019-12-02 DIAGNOSIS — Z5111 Encounter for antineoplastic chemotherapy: Secondary | ICD-10-CM | POA: Diagnosis not present

## 2019-12-02 DIAGNOSIS — Z79899 Other long term (current) drug therapy: Secondary | ICD-10-CM | POA: Diagnosis not present

## 2019-12-02 DIAGNOSIS — G893 Neoplasm related pain (acute) (chronic): Secondary | ICD-10-CM | POA: Diagnosis not present

## 2019-12-02 DIAGNOSIS — R978 Other abnormal tumor markers: Secondary | ICD-10-CM | POA: Diagnosis not present

## 2019-12-02 DIAGNOSIS — R197 Diarrhea, unspecified: Secondary | ICD-10-CM | POA: Diagnosis not present

## 2019-12-02 LAB — CBC WITH DIFFERENTIAL (CANCER CENTER ONLY)
Abs Immature Granulocytes: 0.05 10*3/uL (ref 0.00–0.07)
Basophils Absolute: 0 10*3/uL (ref 0.0–0.1)
Basophils Relative: 0 %
Eosinophils Absolute: 0 10*3/uL (ref 0.0–0.5)
Eosinophils Relative: 1 %
HCT: 28.8 % — ABNORMAL LOW (ref 39.0–52.0)
Hemoglobin: 9.6 g/dL — ABNORMAL LOW (ref 13.0–17.0)
Immature Granulocytes: 1 %
Lymphocytes Relative: 26 %
Lymphs Abs: 1.3 10*3/uL (ref 0.7–4.0)
MCH: 35.8 pg — ABNORMAL HIGH (ref 26.0–34.0)
MCHC: 33.3 g/dL (ref 30.0–36.0)
MCV: 107.5 fL — ABNORMAL HIGH (ref 80.0–100.0)
Monocytes Absolute: 0.6 10*3/uL (ref 0.1–1.0)
Monocytes Relative: 11 %
Neutro Abs: 3.1 10*3/uL (ref 1.7–7.7)
Neutrophils Relative %: 61 %
Platelet Count: 161 10*3/uL (ref 150–400)
RBC: 2.68 MIL/uL — ABNORMAL LOW (ref 4.22–5.81)
RDW: 17.3 % — ABNORMAL HIGH (ref 11.5–15.5)
WBC Count: 5.1 10*3/uL (ref 4.0–10.5)
nRBC: 0 % (ref 0.0–0.2)

## 2019-12-02 LAB — CMP (CANCER CENTER ONLY)
ALT: 28 U/L (ref 0–44)
AST: 19 U/L (ref 15–41)
Albumin: 3.6 g/dL (ref 3.5–5.0)
Alkaline Phosphatase: 163 U/L — ABNORMAL HIGH (ref 38–126)
Anion gap: 9 (ref 5–15)
BUN: 9 mg/dL (ref 8–23)
CO2: 23 mmol/L (ref 22–32)
Calcium: 8.7 mg/dL — ABNORMAL LOW (ref 8.9–10.3)
Chloride: 103 mmol/L (ref 98–111)
Creatinine: 0.52 mg/dL — ABNORMAL LOW (ref 0.61–1.24)
GFR, Est AFR Am: >60 mL/min (ref >60)
GFR, Estimated: >60 mL/min (ref >60)
Glucose, Bld: 146 mg/dL — ABNORMAL HIGH (ref 70–99)
Potassium: 3.3 mmol/L — ABNORMAL LOW (ref 3.5–5.1)
Sodium: 135 mmol/L (ref 135–145)
Total Bilirubin: 0.3 mg/dL (ref 0.3–1.2)
Total Protein: 6.5 g/dL (ref 6.5–8.1)

## 2019-12-02 LAB — IRON AND TIBC
Iron: 97 ug/dL (ref 42–163)
Saturation Ratios: 40 % (ref 20–55)
TIBC: 244 ug/dL (ref 202–409)
UIBC: 146 ug/dL (ref 117–376)

## 2019-12-02 LAB — SAMPLE TO BLOOD BANK

## 2019-12-02 LAB — FERRITIN: Ferritin: 1046 ng/mL — ABNORMAL HIGH (ref 24–336)

## 2019-12-02 MED ORDER — DEXAMETHASONE SODIUM PHOSPHATE 10 MG/ML IJ SOLN
10.0000 mg | Freq: Once | INTRAMUSCULAR | Status: AC
  Administered 2019-12-02: 10 mg via INTRAVENOUS

## 2019-12-02 MED ORDER — ATROPINE SULFATE 1 MG/ML IJ SOLN
0.5000 mg | Freq: Once | INTRAMUSCULAR | Status: AC | PRN
  Administered 2019-12-02: 0.5 mg via INTRAVENOUS

## 2019-12-02 MED ORDER — PALONOSETRON HCL INJECTION 0.25 MG/5ML
INTRAVENOUS | Status: AC
  Filled 2019-12-02: qty 5

## 2019-12-02 MED ORDER — FLUOROURACIL CHEMO INJECTION 2.5 GM/50ML
850.0000 mg | Freq: Once | INTRAVENOUS | Status: AC
  Administered 2019-12-02: 850 mg via INTRAVENOUS
  Filled 2019-12-02: qty 17

## 2019-12-02 MED ORDER — HEPARIN SOD (PORK) LOCK FLUSH 100 UNIT/ML IV SOLN
500.0000 [IU] | Freq: Once | INTRAVENOUS | Status: DC | PRN
  Filled 2019-12-02: qty 5

## 2019-12-02 MED ORDER — IRINOTECAN HCL CHEMO INJECTION 100 MG/5ML
180.0000 mg/m2 | Freq: Once | INTRAVENOUS | Status: AC
  Administered 2019-12-02: 420 mg via INTRAVENOUS
  Filled 2019-12-02: qty 15

## 2019-12-02 MED ORDER — SODIUM CHLORIDE 0.9 % IV SOLN
5000.0000 mg | INTRAVENOUS | Status: DC
  Administered 2019-12-02: 5000 mg via INTRAVENOUS
  Filled 2019-12-02: qty 100

## 2019-12-02 MED ORDER — DEXTROSE 5 % IV SOLN
Freq: Once | INTRAVENOUS | Status: AC
  Filled 2019-12-02: qty 250

## 2019-12-02 MED ORDER — DEXAMETHASONE SODIUM PHOSPHATE 10 MG/ML IJ SOLN
INTRAMUSCULAR | Status: AC
  Filled 2019-12-02: qty 1

## 2019-12-02 MED ORDER — OXALIPLATIN CHEMO INJECTION 100 MG/20ML
86.0000 mg/m2 | Freq: Once | INTRAVENOUS | Status: AC
  Administered 2019-12-02: 200 mg via INTRAVENOUS
  Filled 2019-12-02: qty 40

## 2019-12-02 MED ORDER — LEUCOVORIN CALCIUM INJECTION 350 MG
400.0000 mg/m2 | Freq: Once | INTRAVENOUS | Status: AC
  Administered 2019-12-02: 928 mg via INTRAVENOUS
  Filled 2019-12-02: qty 46.4

## 2019-12-02 MED ORDER — SODIUM CHLORIDE 0.9% FLUSH
10.0000 mL | INTRAVENOUS | Status: DC | PRN
  Filled 2019-12-02: qty 10

## 2019-12-02 MED ORDER — ATROPINE SULFATE 1 MG/ML IJ SOLN
INTRAMUSCULAR | Status: AC
  Filled 2019-12-02: qty 1

## 2019-12-02 MED ORDER — SODIUM CHLORIDE 0.9 % IV SOLN
150.0000 mg | Freq: Once | INTRAVENOUS | Status: AC
  Administered 2019-12-02: 150 mg via INTRAVENOUS
  Filled 2019-12-02: qty 5

## 2019-12-02 MED ORDER — PALONOSETRON HCL INJECTION 0.25 MG/5ML
0.2500 mg | Freq: Once | INTRAVENOUS | Status: AC
  Administered 2019-12-02: 0.25 mg via INTRAVENOUS

## 2019-12-02 NOTE — Patient Instructions (Signed)

## 2019-12-02 NOTE — Progress Notes (Signed)
Patient has had weight loss.  Leave chemotherapy at previous doses today, per Dr. Marin Olp as counts are good.  May adjust future cycles based on weight if necessary.

## 2019-12-02 NOTE — Telephone Encounter (Signed)
Appointments scheduled calendar printed per 3/31 los °

## 2019-12-02 NOTE — Telephone Encounter (Signed)
Contrast w/ instructions has been provided to patient per 3/30 los

## 2019-12-02 NOTE — Patient Instructions (Signed)
[facility] Discharge Instructions for Patients Receiving Chemotherapy  Today you received the following chemotherapy agents Oxaliplatin, Irrinotecan, Leukovorin, 5FU To help prevent nausea and vomiting after your treatment, we encourage you to take your nausea medication as directred   If you develop nausea and vomiting that is not controlled by your nausea medication, call the clinic.   BELOW ARE SYMPTOMS THAT SHOULD BE REPORTED IMMEDIATELY:  *FEVER GREATER THAN 100.5 F  *CHILLS WITH OR WITHOUT FEVER  NAUSEA AND VOMITING THAT IS NOT CONTROLLED WITH YOUR NAUSEA MEDICATION  *UNUSUAL SHORTNESS OF BREATH  *UNUSUAL BRUISING OR BLEEDING  TENDERNESS IN MOUTH AND THROAT WITH OR WITHOUT PRESENCE OF ULCERS  *URINARY PROBLEMS  *BOWEL PROBLEMS  UNUSUAL RASH Items with * indicate a potential emergency and should be followed up as soon as possible.  Feel free to call the clinic should you have any questions or concerns. The clinic phone number is [phone].  Please show the [facility] at check-in to the Emergency Department and triage nurse.

## 2019-12-02 NOTE — Progress Notes (Signed)
Hematology and Oncology Follow Up Visit  Jeffrey Harris [id number] Jul 26, 1956 64 y.o. 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  He looks great.  He is gaining weight.  The diarrhea is much better now.  He is eating well.  He just looks a whole lot better.  Even better is the fact that his CA 19-9 is down to 2100.  He is coming down nicely.  He is having no problems with cough or shortness of breath.  There is no rashes.  He has had no leg swelling.  He has had no nausea or vomiting.  Overall, his performance status right now is ECOG 1.    Medications:  Current Outpatient Medications:  .  dexamethasone (DECADRON) 4 MG tablet, Take 2 tablets (8 mg total) by mouth daily. Start the day after chemotherapy for 3 days. Take with food., Disp: 8 tablet, Rfl: 5 .  KLOR-CON M20 20 MEQ tablet, TAKE 2 TABLETS BY MOUTH DAILY, Disp: 60 tablet, Rfl: 1 .  lipase/protease/amylase (CREON) 36000 UNITS CPEP capsule, Take 2 capsules (72,000 Units total) by mouth 3 (three) times daily before meals., Disp: 180 capsule, Rfl: 6 .  oxyCODONE-acetaminophen (PERCOCET/ROXICET) 5-325 MG tablet, Take 1 tablet by mouth every 6 (six) hours as needed for severe pain., Disp: , Rfl:  .  pantoprazole (PROTONIX) 40 MG tablet, TAKE 1 TABLET BY MOUTH TWICE A DAY, Disp: 60 tablet, Rfl: 3 .  diphenoxylate-atropine (LOMOTIL) 2.5-0.025 MG tablet, Take 1-2 tablets by mouth 4 (four) times daily as needed for diarrhea or loose stools. (Patient not taking: Reported on 11/18/2019), Disp: 100 tablet, Rfl: 0 .  loperamide (IMODIUM A-D) 2 MG tablet, Take 2 at onset of diarrhea, then 1 every 2hrs until 12hr without a BM. May take 2 tab every 4hrs at bedtime. If diarrhea recurs repeat. (Patient not taking: Reported on 09/29/2019), Disp: 100 tablet, Rfl: 1 .   LORazepam (ATIVAN) 0.5 MG tablet, Take 1 tablet (0.5 mg total) by mouth every 6 (six) hours as needed for anxiety. (Patient not taking: Reported on 12/02/2019), Disp: 30 tablet, Rfl: 0 .  ondansetron (ZOFRAN) 8 MG tablet, Take 1 tablet (8 mg total) by mouth 2 (two) times daily as needed. Start on day 3 after chemotherapy. (Patient not taking: Reported on 11/18/2019), Disp: 30 tablet, Rfl: 1 .  prochlorperazine (COMPAZINE) 10 MG tablet, Take 1 tablet (10 mg total) by mouth every 6 (six) hours as needed (Nausea or vomiting). (Patient not taking: Reported on 09/29/2019), Disp: 30 tablet, Rfl: 1 .  zolpidem (AMBIEN) 5 MG tablet, Take 1 tablet (5 mg total) by mouth at bedtime as needed for sleep. (Patient not taking: Reported on 10/27/2019), Disp: 30 tablet, Rfl: 1 No current facility-administered medications for this visit.  Facility-Administered Medications Ordered in Other Visits:  .  sodium chloride flush (NS) 0.9 % injection 10 mL, 10 mL, Intravenous, PRN, Volanda Napoleon, MD, 10 mL at 09/07/19 1245  Allergies: No Known Allergies  Past Medical History, Surgical history, Social history, and Family History were reviewed and updated.  Review of Systems: Review of Systems  Constitutional: Positive for appetite change and unexpected weight change.  HENT:  Negative.   Eyes: Positive for icterus.  Respiratory: Negative.   Cardiovascular: Negative.   Gastrointestinal: Positive for abdominal pain  and constipation.  Endocrine: Negative.   Genitourinary: Negative.    Musculoskeletal: Negative.   Skin: Negative.   Neurological: Negative.   Hematological: Negative.   Psychiatric/Behavioral: Negative.     Physical Exam:  weight is 196 lb 1.3 oz (88.9 kg). His temporal temperature is 97.1 F (36.2 C) (abnormal). His blood pressure is 103/74 and his pulse is 90. His respiration is 18.   Wt Readings from Last 3 Encounters:  12/02/19 196 lb 1.3 oz (88.9 kg)  11/18/19 199 lb (90.3 kg)  10/27/19 197 lb  1.3 oz (89.4 kg)    Physical Exam Vitals reviewed.  Constitutional:      Comments: Overall he is a well-developed and well-nourished white male.  He does not have scleral icterus.  He does not have any palatal jaundice.  There is no jaundice with his skin.  HENT:     Head: Normocephalic and atraumatic.  Eyes:     Pupils: Pupils are equal, round, and reactive to light.  Cardiovascular:     Rate and Rhythm: Normal rate and regular rhythm.     Heart sounds: Normal heart sounds.  Pulmonary:     Effort: Pulmonary effort is normal.     Breath sounds: Normal breath sounds.  Abdominal:     General: Bowel sounds are normal.     Palpations: Abdomen is soft.     Comments: His abdomen is soft.  He has good bowel sounds.  The area on the right side of the abdomen is fully healed.  There is a little bit of erythema.  There is no swelling.  There clearly is no exudate.  There is no palpable abdominal mass.  There is no fluid wave.  There is no palpable liver or spleen tip.  Musculoskeletal:        General: No tenderness or deformity. Normal range of motion.     Cervical back: Normal range of motion.  Lymphadenopathy:     Cervical: No cervical adenopathy.  Skin:    General: Skin is warm and dry.     Coloration: Skin is jaundiced.     Findings: No erythema or rash.  Neurological:     Mental Status: He is alert and oriented to person, place, and time.  Psychiatric:        Behavior: Behavior normal.        Thought Content: Thought content normal.        Judgment: Judgment normal.      Lab Results  Component Value Date   WBC 5.1 12/02/2019   HGB 9.6 (L) 12/02/2019   HCT 28.8 (L) 12/02/2019   MCV 107.5 (H) 12/02/2019   PLT 161 12/02/2019     Chemistry      Component Value Date/Time   NA 132 (L) 11/18/2019 0815   K 5.1 11/18/2019 0815   CL 99 11/18/2019 0815   CO2 25 11/18/2019 0815   BUN 5 (L) 11/18/2019 0815   CREATININE 0.56 (L) 11/18/2019 0815      Component Value Date/Time    CALCIUM 8.7 (L) 11/18/2019 0815   ALKPHOS 139 (H) 11/18/2019 0815   AST 24 11/18/2019 0815   ALT 19 11/18/2019 0815   BILITOT 0.3 11/18/2019 0815       Impression and Plan: Jeffrey Harris is a 64 year old white male.  He has metastatic pancreatic cancer.  He has a very markedly elevated CA 19-9.  I I am happy that his quality of life is doing better now.  He is  looking forward to the upcoming baseball season.  We talked about baseball.  We will go ahead with his eighth cycle of treatment.  After this, we will do his CT scans.  I will give him an extra week off after this cycle just so that we do our scans and maybe give him a little bit of extra time to recover.     Volanda Napoleon, MD 3/31/20219:11 AM

## 2019-12-03 LAB — CANCER ANTIGEN 19-9: CA 19-9: 1461 U/mL — ABNORMAL HIGH (ref 0–35)

## 2019-12-04 ENCOUNTER — Other Ambulatory Visit: Payer: Self-pay | Admitting: *Deleted

## 2019-12-04 ENCOUNTER — Inpatient Hospital Stay: Payer: BC Managed Care – PPO | Attending: Hematology & Oncology

## 2019-12-04 ENCOUNTER — Encounter: Payer: Self-pay | Admitting: *Deleted

## 2019-12-04 ENCOUNTER — Other Ambulatory Visit: Payer: Self-pay

## 2019-12-04 ENCOUNTER — Inpatient Hospital Stay: Payer: BC Managed Care – PPO

## 2019-12-04 VITALS — BP 106/72 | HR 83 | Temp 97.1°F | Resp 16

## 2019-12-04 DIAGNOSIS — K5900 Constipation, unspecified: Secondary | ICD-10-CM | POA: Diagnosis not present

## 2019-12-04 DIAGNOSIS — C787 Secondary malignant neoplasm of liver and intrahepatic bile duct: Secondary | ICD-10-CM | POA: Diagnosis not present

## 2019-12-04 DIAGNOSIS — Z7952 Long term (current) use of systemic steroids: Secondary | ICD-10-CM | POA: Insufficient documentation

## 2019-12-04 DIAGNOSIS — R339 Retention of urine, unspecified: Secondary | ICD-10-CM | POA: Insufficient documentation

## 2019-12-04 DIAGNOSIS — C250 Malignant neoplasm of head of pancreas: Secondary | ICD-10-CM | POA: Diagnosis not present

## 2019-12-04 DIAGNOSIS — Z79899 Other long term (current) drug therapy: Secondary | ICD-10-CM | POA: Diagnosis not present

## 2019-12-04 DIAGNOSIS — C259 Malignant neoplasm of pancreas, unspecified: Secondary | ICD-10-CM

## 2019-12-04 DIAGNOSIS — Z5189 Encounter for other specified aftercare: Secondary | ICD-10-CM | POA: Diagnosis not present

## 2019-12-04 DIAGNOSIS — Z5111 Encounter for antineoplastic chemotherapy: Secondary | ICD-10-CM | POA: Insufficient documentation

## 2019-12-04 MED ORDER — HEPARIN SOD (PORK) LOCK FLUSH 100 UNIT/ML IV SOLN
500.0000 [IU] | Freq: Once | INTRAVENOUS | Status: AC | PRN
  Administered 2019-12-04: 14:00:00 500 [IU]
  Filled 2019-12-04: qty 5

## 2019-12-04 MED ORDER — PEGFILGRASTIM-JMDB 6 MG/0.6ML ~~LOC~~ SOSY
PREFILLED_SYRINGE | SUBCUTANEOUS | Status: AC
  Filled 2019-12-04: qty 0.6

## 2019-12-04 MED ORDER — SODIUM CHLORIDE 0.9% FLUSH
10.0000 mL | INTRAVENOUS | Status: DC | PRN
  Administered 2019-12-04: 10 mL
  Filled 2019-12-04: qty 10

## 2019-12-04 MED ORDER — PEGFILGRASTIM-JMDB 6 MG/0.6ML ~~LOC~~ SOSY
6.0000 mg | PREFILLED_SYRINGE | Freq: Once | SUBCUTANEOUS | Status: AC
  Administered 2019-12-04: 6 mg via SUBCUTANEOUS

## 2019-12-04 NOTE — Patient Instructions (Signed)
[facility] Discharge Instructions for Patients receiving Home Portable Chemo Pump    **The bag should finish at 46 hours, 96 hours or 7 days. For example, if your pump is scheduled for 46 hours and it was put on at 4pm, it should finish at 2 pm the day it is scheduled to come off regardless of your appointment time.    Estimated time to finish   _________________________ (Have your nurse fill in)     ** if the display on your pump reads "Low Volume" and it is beeping, take the batteries out of the pump and come to the cancer center for it to be taken off.   **If the pump alarms go off prior to the pump reading "Low Volume" then call the [phone] and someone can assist you.  **If the plunger comes out and the bag fluid is running out, please use your chemo spill kit to clean up the spill. Do not use paper towels or other house hold products.  ** If you have problems or questions regarding your pump, please call either the [phone] or the cancer center Monday-Friday 8:00am-4:30pm at [phone] and we will assist you.  If you are unable to get assistance then go to [facility] Emergency Room, ask the staff to contact the IV team for assistance.

## 2019-12-09 ENCOUNTER — Encounter: Payer: Self-pay | Admitting: *Deleted

## 2019-12-10 ENCOUNTER — Encounter: Payer: Self-pay | Admitting: *Deleted

## 2019-12-11 ENCOUNTER — Encounter: Payer: Self-pay | Admitting: *Deleted

## 2019-12-11 ENCOUNTER — Encounter (HOSPITAL_COMMUNITY): Payer: Self-pay | Admitting: *Deleted

## 2019-12-11 ENCOUNTER — Encounter: Payer: Self-pay | Admitting: Hematology & Oncology

## 2019-12-11 ENCOUNTER — Emergency Department (HOSPITAL_COMMUNITY)
Admission: EM | Admit: 2019-12-11 | Discharge: 2019-12-11 | Disposition: A | Discharge status: Home / Self Care | Payer: BC Managed Care – PPO | Attending: Emergency Medicine | Admitting: Emergency Medicine

## 2019-12-11 ENCOUNTER — Other Ambulatory Visit: Payer: Self-pay

## 2019-12-11 ENCOUNTER — Other Ambulatory Visit: Payer: Self-pay | Admitting: Hematology & Oncology

## 2019-12-11 ENCOUNTER — Emergency Department (HOSPITAL_COMMUNITY): Payer: BC Managed Care – PPO

## 2019-12-11 DIAGNOSIS — F17220 Nicotine dependence, chewing tobacco, uncomplicated: Secondary | ICD-10-CM | POA: Diagnosis not present

## 2019-12-11 DIAGNOSIS — C259 Malignant neoplasm of pancreas, unspecified: Secondary | ICD-10-CM | POA: Insufficient documentation

## 2019-12-11 DIAGNOSIS — K649 Unspecified hemorrhoids: Secondary | ICD-10-CM | POA: Diagnosis not present

## 2019-12-11 DIAGNOSIS — R339 Retention of urine, unspecified: Secondary | ICD-10-CM | POA: Insufficient documentation

## 2019-12-11 DIAGNOSIS — C7B02 Secondary carcinoid tumors of liver: Secondary | ICD-10-CM | POA: Insufficient documentation

## 2019-12-11 DIAGNOSIS — E876 Hypokalemia: Secondary | ICD-10-CM | POA: Diagnosis not present

## 2019-12-11 DIAGNOSIS — K5900 Constipation, unspecified: Secondary | ICD-10-CM | POA: Diagnosis not present

## 2019-12-11 DIAGNOSIS — K625 Hemorrhage of anus and rectum: Secondary | ICD-10-CM | POA: Diagnosis not present

## 2019-12-11 LAB — CBC WITH DIFFERENTIAL/PLATELET
Abs Immature Granulocytes: 0.14 10*3/uL — ABNORMAL HIGH (ref 0.00–0.07)
Basophils Absolute: 0 10*3/uL (ref 0.0–0.1)
Basophils Relative: 0 %
Eosinophils Absolute: 0 10*3/uL (ref 0.0–0.5)
Eosinophils Relative: 0 %
HCT: 25.1 % — ABNORMAL LOW (ref 39.0–52.0)
Hemoglobin: 8.3 g/dL — ABNORMAL LOW (ref 13.0–17.0)
Immature Granulocytes: 6 %
Lymphocytes Relative: 32 %
Lymphs Abs: 0.7 10*3/uL (ref 0.7–4.0)
MCH: 35.9 pg — ABNORMAL HIGH (ref 26.0–34.0)
MCHC: 33.1 g/dL (ref 30.0–36.0)
MCV: 108.7 fL — ABNORMAL HIGH (ref 80.0–100.0)
Monocytes Absolute: 0.3 10*3/uL (ref 0.1–1.0)
Monocytes Relative: 13 %
Neutro Abs: 1.1 10*3/uL — ABNORMAL LOW (ref 1.7–7.7)
Neutrophils Relative %: 49 %
Platelets: 50 10*3/uL — ABNORMAL LOW (ref 150–400)
RBC: 2.31 MIL/uL — ABNORMAL LOW (ref 4.22–5.81)
RDW: 15.3 % (ref 11.5–15.5)
WBC: 2.3 10*3/uL — ABNORMAL LOW (ref 4.0–10.5)
nRBC: 0 % (ref 0.0–0.2)

## 2019-12-11 LAB — URINALYSIS, ROUTINE W REFLEX MICROSCOPIC
Bilirubin Urine: NEGATIVE
Glucose, UA: NEGATIVE mg/dL
Hgb urine dipstick: NEGATIVE
Ketones, ur: NEGATIVE mg/dL
Leukocytes,Ua: NEGATIVE
Nitrite: NEGATIVE
Protein, ur: NEGATIVE mg/dL
Specific Gravity, Urine: 1.014 (ref 1.005–1.030)
pH: 6 (ref 5.0–8.0)

## 2019-12-11 LAB — COMPREHENSIVE METABOLIC PANEL
ALT: 32 U/L (ref 0–44)
AST: 25 U/L (ref 15–41)
Albumin: 3.2 g/dL — ABNORMAL LOW (ref 3.5–5.0)
Alkaline Phosphatase: 148 U/L — ABNORMAL HIGH (ref 38–126)
Anion gap: 9 (ref 5–15)
BUN: 6 mg/dL — ABNORMAL LOW (ref 8–23)
CO2: 25 mmol/L (ref 22–32)
Calcium: 8.2 mg/dL — ABNORMAL LOW (ref 8.9–10.3)
Chloride: 104 mmol/L (ref 98–111)
Creatinine, Ser: 0.5 mg/dL — ABNORMAL LOW (ref 0.61–1.24)
GFR calc Af Amer: >60 mL/min (ref >60)
GFR calc non Af Amer: >60 mL/min (ref >60)
Glucose, Bld: 190 mg/dL — ABNORMAL HIGH (ref 70–99)
Potassium: 2.8 mmol/L — ABNORMAL LOW (ref 3.5–5.1)
Sodium: 138 mmol/L (ref 135–145)
Total Bilirubin: 0.4 mg/dL (ref 0.3–1.2)
Total Protein: 6.2 g/dL — ABNORMAL LOW (ref 6.5–8.1)

## 2019-12-11 LAB — LIPASE, BLOOD: Lipase: 16 U/L (ref 11–51)

## 2019-12-11 MED ORDER — HYDROCORTISONE ACETATE 25 MG RE SUPP: 25.0000 mg | Freq: Two times a day (BID) | RECTAL | 0 refills | Status: DC

## 2019-12-11 MED ORDER — HEPARIN SOD (PORK) LOCK FLUSH 100 UNIT/ML IV SOLN
500.0000 [IU] | Freq: Once | INTRAVENOUS | Status: AC
  Administered 2019-12-11: 500 [IU]
  Filled 2019-12-11: qty 5

## 2019-12-11 MED ORDER — SODIUM CHLORIDE (PF) 0.9 % IJ SOLN
INTRAMUSCULAR | Status: AC
  Filled 2019-12-11: qty 50

## 2019-12-11 MED ORDER — POTASSIUM CHLORIDE CRYS ER 20 MEQ PO TBCR
40.0000 meq | EXTENDED_RELEASE_TABLET | Freq: Once | ORAL | Status: AC
  Administered 2019-12-11: 40 meq via ORAL
  Filled 2019-12-11: qty 2

## 2019-12-11 MED ORDER — IOHEXOL 300 MG/ML  SOLN
100.0000 mL | Freq: Once | INTRAMUSCULAR | Status: AC | PRN
  Administered 2019-12-11: 100 mL via INTRAVENOUS

## 2019-12-11 MED ORDER — SODIUM CHLORIDE 0.9 % IV SOLN: INTRAVENOUS | Status: DC

## 2019-12-11 NOTE — ED Provider Notes (Addendum)
[facility] DEPT Provider Note   CSN: [id number] Arrival date & time: 12/11/19  1343     History Chief Complaint  Patient presents with  . Urinary Retention  . Constipation    Jeffrey Harris is a 64 y.o. male.  Patient presenting with difficulty voiding as well as difficulty with what he perceives to be constipation.  Had a good void at 3 in the morning.  But unable to void well since that time.  Has had trouble with constipation before.  No nausea or vomiting.  Patient does have a history of pancreatic cancer.  Not on any pain medications though.  The pancreatic cancer is metastasized to the liver.  This was all diagnosed in November 2020.  Is followed by hematology oncology.  Prior to me seeing the patient.  The nurse had done a bladder scan he had greater than 400 cc of urine present in the bladder so she placed a Foley catheter.  Patient has significant relief following that.  Patient denies any fevers or any upper respiratory symptoms.  For the pancreatic cancer patient followed by Dr. Marin Olp.  According to oncology notes.  He is receiving infusion treatments.        Past Medical History:  Diagnosis Date  . Goals of care, counseling/discussion 07/29/2019  . Gout   . Hyperbilirubinemia 07/2019  . Pancreatic cancer metastasized to liver [facility]) 07/29/2019    Patient Active Problem List   Diagnosis Date Noted  . Goals of care, counseling/discussion 07/29/2019  . Pancreatic cancer metastasized to liver ([place]) 07/29/2019  . Hyponatremia   . Elevated LFTs   . Jaundice   . Obstructive jaundice   . Hyperbilirubinemia 07/23/2019  . Elevated liver enzymes 07/23/2019  . History of gout 07/23/2019  . Obesity (BMI 30-39.9) 07/23/2019    Past Surgical History:  Procedure Laterality Date  . BIOPSY  07/25/2019   Procedure: BIOPSY;  Surgeon: Ronnette Juniper, MD;  Location: [facility] ENDOSCOPY;  Service: Gastroenterology;;  . ESOPHAGOGASTRODUODENOSCOPY   07/25/2019   Procedure: ESOPHAGOGASTRODUODENOSCOPY (EGD);  Surgeon: Ronnette Juniper, MD;  Location: [facility];  Service: Gastroenterology;;  . IR BILIARY STENT(S) EXISTING ACCESS INC DILATION CATH EXCHANGE  08/27/2019  . IR CHOLANGIOGRAM EXISTING TUBE  09/01/2019  . IR CONVERT BILIARY DRAIN TO INT EXT BILIARY DRAIN  08/27/2019  . IR IMAGING GUIDED PORT INSERTION  08/06/2019  . IR INT EXT BILIARY DRAIN WITH CHOLANGIOGRAM  07/26/2019  . IR RADIOLOGIST EVAL & MGMT  08/05/2019  . IR REMOVAL BILIARY DRAIN  09/01/2019  . KNEE ARTHROSCOPY         Family History  Problem Relation Age of Onset  . Diabetes Father     Social History   Tobacco Use  . Smoking status: Never Smoker  . Smokeless tobacco: Former Systems developer    Types: Snuff  Substance Use Topics  . Alcohol use: No  . Drug use: No    Home Medications Prior to Admission medications   Medication Sig Start Date End Date Taking? Authorizing Provider  dexamethasone (DECADRON) 4 MG tablet Take 2 tablets (8 mg total) by mouth daily. Start the day after chemotherapy for 3 days. Take with food. 08/07/19   Volanda Napoleon, MD  diphenoxylate-atropine (LOMOTIL) 2.5-0.025 MG tablet Take 1-2 tablets by mouth 4 (four) times daily as needed for diarrhea or loose stools. Patient not taking: Reported on 11/18/2019 11/09/19   Volanda Napoleon, MD  KLOR-CON M20 20 MEQ tablet TAKE 2 TABLETS BY MOUTH DAILY  11/18/19   Volanda Napoleon, MD  lipase/protease/amylase (CREON) 36000 UNITS CPEP capsule Take 2 capsules (72,000 Units total) by mouth 3 (three) times daily before meals. 09/14/19   Volanda Napoleon, MD  loperamide (IMODIUM A-D) 2 MG tablet Take 2 at onset of diarrhea, then 1 every 2hrs until 12hr without a BM. May take 2 tab every 4hrs at bedtime. If diarrhea recurs repeat. Patient not taking: Reported on 09/29/2019 08/03/19   Volanda Napoleon, MD  LORazepam (ATIVAN) 0.5 MG tablet Take 1 tablet (0.5 mg total) by mouth every 6 (six) hours as needed for  anxiety. Patient not taking: Reported on 12/02/2019 10/28/19   Volanda Napoleon, MD  ondansetron (ZOFRAN) 8 MG tablet Take 1 tablet (8 mg total) by mouth 2 (two) times daily as needed. Start on day 3 after chemotherapy. Patient not taking: Reported on 11/18/2019 08/07/19   Volanda Napoleon, MD  oxyCODONE-acetaminophen (PERCOCET/ROXICET) 5-325 MG tablet Take 1 tablet by mouth every 6 (six) hours as needed for severe pain.    [provider]  pantoprazole (PROTONIX) 40 MG tablet TAKE 1 TABLET BY MOUTH TWICE A DAY 10/23/19   Volanda Napoleon, MD  prochlorperazine (COMPAZINE) 10 MG tablet Take 1 tablet (10 mg total) by mouth every 6 (six) hours as needed (Nausea or vomiting). Patient not taking: Reported on 09/29/2019 08/07/19   Volanda Napoleon, MD  zolpidem (AMBIEN) 5 MG tablet Take 1 tablet (5 mg total) by mouth at bedtime as needed for sleep. Patient not taking: Reported on 10/27/2019 08/17/19   Volanda Napoleon, MD    Allergies    Patient has no known allergies.  Review of Systems   Review of Systems  Constitutional: Negative for chills and fever.  HENT: Negative for congestion, rhinorrhea and sore throat.   Eyes: Negative for visual disturbance.  Respiratory: Negative for cough and shortness of breath.   Cardiovascular: Negative for chest pain and leg swelling.  Gastrointestinal: Positive for constipation. Negative for abdominal pain, diarrhea, nausea and vomiting.  Genitourinary: Positive for difficulty urinating. Negative for dysuria.  Musculoskeletal: Negative for back pain and neck pain.  Skin: Negative for rash.  Neurological: Negative for dizziness, light-headedness and headaches.  Hematological: Does not bruise/bleed easily.  Psychiatric/Behavioral: Negative for confusion.    Physical Exam Updated Vital Signs BP 109/71 (BP Location: Left Arm)   Pulse 79   Temp 98.5 F (36.9 C) (Oral)   Resp 17   Wt 88.9 kg   SpO2 100%   BMI 27.35 kg/m   Physical Exam Vitals and  nursing note reviewed.  Constitutional:      Appearance: Normal appearance. He is well-developed.  HENT:     Head: Normocephalic and atraumatic.  Eyes:     Extraocular Movements: Extraocular movements intact.     Conjunctiva/sclera: Conjunctivae normal.     Pupils: Pupils are equal, round, and reactive to light.  Cardiovascular:     Rate and Rhythm: Normal rate and regular rhythm.     Heart sounds: No murmur.  Pulmonary:     Effort: Pulmonary effort is normal. No respiratory distress.     Breath sounds: Normal breath sounds.  Abdominal:     Palpations: Abdomen is soft.     Tenderness: There is no abdominal tenderness.  Musculoskeletal:        General: No swelling.     Cervical back: Normal range of motion and neck supple.  Skin:    General: Skin is warm and dry.  Capillary Refill: Capillary refill takes less than 2 seconds.  Neurological:     General: No focal deficit present.     Mental Status: He is alert and oriented to person, place, and time.     Cranial Nerves: No cranial nerve deficit.     Sensory: No sensory deficit.     Motor: No weakness.     ED Results / Procedures / Treatments   Labs (all labs ordered are listed, but only abnormal results are displayed) Labs Reviewed  COMPREHENSIVE METABOLIC PANEL - Abnormal; Notable for the following components:      Result Value   Potassium 2.8 (*)    Glucose, Bld 190 (*)    BUN 6 (*)    Creatinine, Ser 0.50 (*)    Calcium 8.2 (*)    Total Protein 6.2 (*)    Albumin 3.2 (*)    Alkaline Phosphatase 148 (*)    All other components within normal limits  URINALYSIS, ROUTINE W REFLEX MICROSCOPIC  LIPASE, BLOOD  CBC WITH DIFFERENTIAL/PLATELET    EKG None  Radiology No results found.  Procedures Procedures (including critical care time)  Medications Ordered in ED Medications  0.9 %  sodium chloride infusion ( Intravenous New Bag/Given 12/11/19 1445)  potassium chloride SA (KLOR-CON) CR tablet 40 mEq (has no  administration in time range)    ED Course  I have reviewed the triage vital signs and the nursing notes.  Pertinent labs & imaging results that were available during my care of the patient were reviewed by me and considered in my medical decision making (see chart for details).    MDM Rules/Calculators/A&P                      Patient clearly with urinary retention.  Foley catheter placed.  With greater than 400 cc of urine out.  Patient with significant relief from that.  For the concern for the constipation.  We will do CT scan of the abdomen to make sure there is no evidence of any blockages.  If just straight up constipation MiraLAX may be appropriate for him to use. For the urinary retention Foley catheter will stay in place and be moved over to leg bag and have him follow-up with urology.    Final Clinical Impression(s) / ED Diagnoses Final diagnoses:  Urinary retention  Hypokalemia    Rx / DC Orders ED Discharge Orders    None       Fredia Sorrow, MD 12/11/19 1428    Fredia Sorrow, MD 12/11/19 1429    Fredia Sorrow, MD 12/11/19 1430   Patient with normal renal function.  The potassium was 2.8.  We will give 40 mucous oral potassium.  Patient will require some potassium supplementation at home as well if discharged.  CT scan results still pending.    Fredia Sorrow, MD 12/11/19 [phone]

## 2019-12-11 NOTE — ED Triage Notes (Signed)
Unable to void, last void 0300, constipation also

## 2019-12-11 NOTE — Discharge Instructions (Signed)
The CAT scan did not show any specific problems.  We are referring you to gastroenterology for evaluation of the stooling problem and hemorrhoids.  The bleeding you are having is probably from the hemorrhoids but the GI doctor can check that as well.  Foley catheter needs to stay in place since you had urinary retention.  Follow-up with the urologist for a checkup in 5 days or so.

## 2019-12-11 NOTE — ED Provider Notes (Signed)
3:55- CT A/P, folet at dispo. Consider dispo--- patient checked out by Dr. Rogene Houston.  Clinical Course as of Dec 10 1633  Fri Dec 11, 2019  1628 Normal except white count low, hemoglobin low, MCV high, platelets low  CBC with Differential/Platelet(!) [EW]  1632 Normal  Lipase, blood [EW]  1632 Normal except potassium low, glucose high, BUN low, creatinine low, calcium low, total protein low, albumin low, alk phos stays high  Comprehensive metabolic panel(!) [EW]  4656 Normal  Urinalysis, Routine w reflex microscopic- may I&O cath if menses [EW]  1634 Radiology interpretation, CT abdomen pelvis, specifies GI tract abnormalities consistent with a diarrheal state.  There is no obstruction or perforation.  Pancreatic head mass has resolved.  Persistent hepatic lesions are present.  These appear improved and likely represent metastases.  Pneumobilia is present, consistent with biliary stenting.  Uncomplicated presence of Foley catheter.  Aortic atherosclerosis present.  CT Abdomen Pelvis W Contrast [EW]    Clinical Course User Index [EW] Daleen Bo, MD    CT Abdomen Pelvis W Contrast  Result Date: 12/11/2019 CLINICAL DATA:  Difficulty voiding and constipation. History of pancreatic cancer. EXAM: CT ABDOMEN AND PELVIS WITH CONTRAST TECHNIQUE: Multidetector CT imaging of the abdomen and pelvis was performed using the standard protocol following bolus administration of intravenous contrast. CONTRAST:  121m OMNIPAQUE IOHEXOL 300 MG/ML  SOLN COMPARISON:  Abdominopelvic CT 10/06/2019 and 07/30/2019 FINDINGS: Lower chest: Mild linear atelectasis or scarring at both lung bases, similar to previous study. No significant pleural or pericardial effusion. The distal esophagus is mildly distended and fluid-filled. Hepatobiliary: Residual scattered ill-defined low-density hepatic lesions are unchanged from the most recent study and improved from November, likely treated metastases. No new or enlarging hepatic  lesions. Stable pneumobilia and gas in the gallbladder lumen attributed to the biliary stent. No biliary dilatation. Pancreas: No residual measurable pancreatic head mass. There is no pancreatic ductal dilatation or surrounding inflammation. Spleen: Normal in size without focal abnormality. Adrenals/Urinary Tract: Stable 2.3 cm low-density left adrenal lesion on image 22/2 consistent with an adenoma or myelolipoma. There is a stable smaller low-density lesion involving the lateral limb of the left adrenal gland on image 30/2. The right adrenal gland appears normal. 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 The left kidney appears normal. No evidence of urinary tract calculus or hydronephrosis. A Foley catheter is present within the bladder lumen. There is some gas within the bladder lumen. Stomach/Bowel: The stomach is fluid-filled and mildly distended. No significant small bowel distension, wall thickening or surrounding inflammation. The appendix appears normal. The colon is also fluid-filled, but shows no significant distension, wall thickening or surrounding inflammation. Vascular/Lymphatic: Stable 13 mm portacaval node on image 29/2. No progressive retroperitoneal lymphadenopathy. No acute vascular findings. Aortic and branch vessel atherosclerosis. The portal, superior mesenteric and splenic veins are patent. Reproductive: The prostate gland and seminal vesicles appear normal. Other: No evidence of abdominal wall mass or hernia. No ascites. Musculoskeletal: No acute or significant osseous findings. IMPRESSION: 1. Fluid-filled and mildly distended distal esophagus, stomach and colon, suggesting diarrheal state. No evidence of bowel obstruction or perforation. 2. Resolution of previously demonstrated pancreatic head mass. Stable residual scattered ill-defined low-density hepatic lesions, improved from November, likely treated metastases. No new or enlarging hepatic lesions. 3.  Persistent pneumobilia post biliary stenting. 4. Bladder decompressed by Foley catheter.  No hydronephrosis. 5. Aortic Atherosclerosis (ICD10-I70.0). Electronically Signed   By: WRichardean SaleM.D.   On: 12/11/2019 16:24  Patient Vitals for the past 24 hrs:  BP Temp Temp src Pulse Resp SpO2 Weight  12/11/19 1630 126/73 -- -- 79 -- 100 % --  12/11/19 1546 121/81 -- -- 91 18 100 % --  12/11/19 1530 121/81 -- -- 86 -- 100 % --  12/11/19 1500 110/74 -- -- 81 -- 100 % --  12/11/19 1445 118/69 -- -- 81 -- 100 % --  12/11/19 1438 109/71 -- -- 79 17 100 % --  12/11/19 1352 -- -- -- -- -- -- 88.9 kg  12/11/19 1351 104/76 98.5 F (36.9 C) Oral 100 16 100 % --    4:35 PM Reevaluation with update and discussion. After initial assessment and treatment, an updated evaluation reveals patient is resting comfortably and cooperative.  I asked him why he was here and he states he thought he had a blockage in the left side of his colon.  He denies constipation.  He has been having frequent bowel movements the last few days that he describes as "spongy," 6 to 8/day.  He describes large volume stools.  He has had visualized blood on and off for the last 36 hours, both mixed with stool, and when wiping.  He knows that he has hemorrhoids and he describes pain with defecation, at the anus.  He does not currently have a GI doctor.  His bowels have been irregular since his treatment for cancer was initiated.  He has an upcoming chemotherapy in 2 weeks.  He denies difficulty eating.  He has pain medicine at home that he can use.  I examined his anus and he has small external nonthrombosed hemorrhoids, without evident bleeding.  The anus itself is mildly thickened, and on digital exam he is very uncomfortable.  I do not feel or see an anal fissure.  I do not appreciate internal hemorrhoids however he would only let me get my finger about 3 cm into the anus.  Findings discussed with patient and wife with him, all questions  answered. Daleen Bo   Medical Decision Making:  This patient is presenting for evaluation of pain with defecation and frequent bowel movements, which did require a range of treatment options, and is a complaint that involves a moderate risk of morbidity and mortality. The differential diagnoses include recurrence of pancreatic cancer with metastases, intra-abdominal infection, bowel obstruction, and anal disorders including hemorrhoids.  ED evaluation, remarkable for urinary retention, cause not clear.  Urinary bladder decompressed with Foley catheter.  This will have to remain in place, and he will have to follow-up with urology for removal I decided  to review old records, and in summary patient recovering from pancreatic cancer, currently receiving chemotherapy with ongoing GI issues including bowel regularity.  Rectal bleeding, likely hemorrhoidal.  CT does not indicate intra-abdominal or or colon infection.  Unclear cause for urinary retention. I did get additional historical information from his wife. Clinical Laboratory Tests Ordered, included CBC, c-Met.  Results reviewed remarkable for leukopenia, anemia, hypokalemia. Radiologic Tests Ordered, included CT imaging, with reassuring findings. I independently Visualized: CT images, which show improved status post chemotherapy for pancreatic cancer.  Critical Interventions-none  After evaluation, the Patient was reevaluated and was found stable for discharge.  Ongoing stooling issues will be evaluated as an outpatient by referral to GI.  Anal pain will be treated with hydrocortisone suppository for 1 week.  Rectal bleeding can be assessed by GI as well.  Foley catheter, will be left in place discharge and he will have  to follow-up with urology for removal of the in 5 to 7 days.  CRITICAL CARE-no Performed by: Daleen Bo  Nursing Notes Reviewed/ Care Coordinated Applicable Imaging Reviewed Interpretation of Laboratory Data incorporated  into ED treatment  The patient appears reasonably screened and/or stabilized for discharge and I doubt any other medical condition or other [facility] requiring further screening, evaluation, or treatment in the ED at this time prior to discharge.  Plan: Home Medications-continue usual; Home Treatments-Foley catheter care, soak in tub for hemorrhoids; return here if the recommended treatment, does not improve the symptoms; Recommended follow up-oncology as scheduled.  Urology in 5 to 7 days for catheter removal and further treatment.  GI for evaluation of hemorrhoids, and stool irregularities, and rectal bleeding.    Daleen Bo, MD 12/11/19 [phone]

## 2019-12-11 NOTE — ED Notes (Signed)
Pt transported to CT ?

## 2019-12-15 NOTE — Progress Notes (Signed)
Pharmacist Chemotherapy Monitoring - Follow Up Assessment    I verify that I have reviewed each item in the below checklist:  . Regimen for the patient is scheduled for the appropriate day and plan matches scheduled date. [name] Appropriate non-routine labs are ordered dependent on drug ordered. . If applicable, additional medications reviewed and ordered per protocol based on lifetime cumulative doses and/or treatment regimen.   Plan for follow-up and/or issues identified: Yes . I-vent associated with next due treatment: Yes . MD and/or nursing notified: Yes  Jeffrey Harris, [name] 12/15/2019 8:22 AM

## 2019-12-16 ENCOUNTER — Other Ambulatory Visit: Payer: BC Managed Care – PPO

## 2019-12-16 ENCOUNTER — Other Ambulatory Visit: Payer: Self-pay

## 2019-12-16 ENCOUNTER — Ambulatory Visit: Payer: BC Managed Care – PPO

## 2019-12-16 ENCOUNTER — Other Ambulatory Visit: Payer: Self-pay | Admitting: Hematology & Oncology

## 2019-12-16 ENCOUNTER — Encounter: Payer: Self-pay | Admitting: Hematology & Oncology

## 2019-12-16 ENCOUNTER — Ambulatory Visit: Payer: BC Managed Care – PPO | Admitting: Hematology & Oncology

## 2019-12-16 ENCOUNTER — Ambulatory Visit
Admission: RE | Admit: 2019-12-16 | Discharge: 2019-12-16 | Disposition: A | Discharge status: Home / Self Care | Payer: BC Managed Care – PPO | Source: Ambulatory Visit | Attending: Hematology & Oncology | Admitting: Hematology & Oncology

## 2019-12-16 DIAGNOSIS — C259 Malignant neoplasm of pancreas, unspecified: Secondary | ICD-10-CM | POA: Insufficient documentation

## 2019-12-16 DIAGNOSIS — C787 Secondary malignant neoplasm of liver and intrahepatic bile duct: Secondary | ICD-10-CM | POA: Diagnosis not present

## 2019-12-16 MED ORDER — IOHEXOL 300 MG/ML  SOLN
100.0000 mL | Freq: Once | INTRAMUSCULAR | Status: AC | PRN
  Administered 2019-12-16: 100 mL via INTRAVENOUS

## 2019-12-17 NOTE — Progress Notes (Signed)
Pharmacist Chemotherapy Monitoring - Follow Up Assessment    I verify that I have reviewed each item in the below checklist:  . Regimen for the patient is scheduled for the appropriate day and plan matches scheduled date. [name] Appropriate non-routine labs are ordered dependent on drug ordered. . If applicable, additional medications reviewed and ordered per protocol based on lifetime cumulative doses and/or treatment regimen.   Plan for follow-up and/or issues identified: No . I-vent associated with next due treatment: No . MD and/or nursing notified: No  Jeffrey Harris, [name] 12/17/2019 8:32 AM

## 2019-12-18 ENCOUNTER — Ambulatory Visit: Payer: BC Managed Care – PPO

## 2019-12-18 DIAGNOSIS — E876 Hypokalemia: Secondary | ICD-10-CM | POA: Diagnosis not present

## 2019-12-18 DIAGNOSIS — D649 Anemia, unspecified: Secondary | ICD-10-CM | POA: Diagnosis not present

## 2019-12-18 DIAGNOSIS — C259 Malignant neoplasm of pancreas, unspecified: Secondary | ICD-10-CM | POA: Diagnosis not present

## 2019-12-18 DIAGNOSIS — K602 Anal fissure, unspecified: Secondary | ICD-10-CM | POA: Diagnosis not present

## 2019-12-18 DIAGNOSIS — K625 Hemorrhage of anus and rectum: Secondary | ICD-10-CM | POA: Diagnosis not present

## 2019-12-19 DIAGNOSIS — C259 Malignant neoplasm of pancreas, unspecified: Secondary | ICD-10-CM | POA: Diagnosis not present

## 2019-12-22 ENCOUNTER — Inpatient Hospital Stay (HOSPITAL_BASED_OUTPATIENT_CLINIC_OR_DEPARTMENT_OTHER): Payer: BC Managed Care – PPO | Admitting: Hematology & Oncology

## 2019-12-22 ENCOUNTER — Inpatient Hospital Stay: Payer: BC Managed Care – PPO

## 2019-12-22 ENCOUNTER — Other Ambulatory Visit: Payer: Self-pay

## 2019-12-22 ENCOUNTER — Telehealth: Payer: Self-pay | Admitting: Hematology & Oncology

## 2019-12-22 VITALS — Wt 196.8 lb

## 2019-12-22 DIAGNOSIS — C787 Secondary malignant neoplasm of liver and intrahepatic bile duct: Secondary | ICD-10-CM | POA: Diagnosis not present

## 2019-12-22 DIAGNOSIS — K5900 Constipation, unspecified: Secondary | ICD-10-CM | POA: Diagnosis not present

## 2019-12-22 DIAGNOSIS — C250 Malignant neoplasm of head of pancreas: Secondary | ICD-10-CM | POA: Diagnosis not present

## 2019-12-22 DIAGNOSIS — C259 Malignant neoplasm of pancreas, unspecified: Secondary | ICD-10-CM

## 2019-12-22 DIAGNOSIS — Z7952 Long term (current) use of systemic steroids: Secondary | ICD-10-CM | POA: Diagnosis not present

## 2019-12-22 DIAGNOSIS — Z5111 Encounter for antineoplastic chemotherapy: Secondary | ICD-10-CM | POA: Diagnosis not present

## 2019-12-22 DIAGNOSIS — R339 Retention of urine, unspecified: Secondary | ICD-10-CM | POA: Diagnosis not present

## 2019-12-22 DIAGNOSIS — Z79899 Other long term (current) drug therapy: Secondary | ICD-10-CM | POA: Diagnosis not present

## 2019-12-22 DIAGNOSIS — Z5189 Encounter for other specified aftercare: Secondary | ICD-10-CM | POA: Diagnosis not present

## 2019-12-22 LAB — CMP (CANCER CENTER ONLY)
ALT: 21 U/L (ref 0–44)
AST: 24 U/L (ref 15–41)
Albumin: 3.4 g/dL — ABNORMAL LOW (ref 3.5–5.0)
Alkaline Phosphatase: 162 U/L — ABNORMAL HIGH (ref 38–126)
Anion gap: 7 (ref 5–15)
BUN: 5 mg/dL — ABNORMAL LOW (ref 8–23)
CO2: 30 mmol/L (ref 22–32)
Calcium: 8.7 mg/dL — ABNORMAL LOW (ref 8.9–10.3)
Chloride: 100 mmol/L (ref 98–111)
Creatinine: 0.47 mg/dL — ABNORMAL LOW (ref 0.61–1.24)
GFR, Est AFR Am: >60 mL/min (ref >60)
GFR, Estimated: >60 mL/min (ref >60)
Glucose, Bld: 199 mg/dL — ABNORMAL HIGH (ref 70–99)
Potassium: 3.3 mmol/L — ABNORMAL LOW (ref 3.5–5.1)
Sodium: 137 mmol/L (ref 135–145)
Total Bilirubin: 0.3 mg/dL (ref 0.3–1.2)
Total Protein: 6.7 g/dL (ref 6.5–8.1)

## 2019-12-22 LAB — CBC WITH DIFFERENTIAL (CANCER CENTER ONLY)
Abs Immature Granulocytes: 0.02 10*3/uL (ref 0.00–0.07)
Basophils Absolute: 0 10*3/uL (ref 0.0–0.1)
Basophils Relative: 1 %
Eosinophils Absolute: 0 10*3/uL (ref 0.0–0.5)
Eosinophils Relative: 1 %
HCT: 27.8 % — ABNORMAL LOW (ref 39.0–52.0)
Hemoglobin: 9.2 g/dL — ABNORMAL LOW (ref 13.0–17.0)
Immature Granulocytes: 1 %
Lymphocytes Relative: 33 %
Lymphs Abs: 1.1 10*3/uL (ref 0.7–4.0)
MCH: 35.2 pg — ABNORMAL HIGH (ref 26.0–34.0)
MCHC: 33.1 g/dL (ref 30.0–36.0)
MCV: 106.5 fL — ABNORMAL HIGH (ref 80.0–100.0)
Monocytes Absolute: 0.4 10*3/uL (ref 0.1–1.0)
Monocytes Relative: 11 %
Neutro Abs: 1.8 10*3/uL (ref 1.7–7.7)
Neutrophils Relative %: 53 %
Platelet Count: 111 10*3/uL — ABNORMAL LOW (ref 150–400)
RBC: 2.61 MIL/uL — ABNORMAL LOW (ref 4.22–5.81)
RDW: 15.7 % — ABNORMAL HIGH (ref 11.5–15.5)
WBC Count: 3.3 10*3/uL — ABNORMAL LOW (ref 4.0–10.5)
nRBC: 0 % (ref 0.0–0.2)

## 2019-12-22 MED ORDER — DEXTROSE 5 % IV SOLN
Freq: Once | INTRAVENOUS | Status: AC
  Filled 2019-12-22: qty 250

## 2019-12-22 MED ORDER — SODIUM CHLORIDE 0.9% FLUSH
10.0000 mL | INTRAVENOUS | Status: DC | PRN
  Filled 2019-12-22: qty 10

## 2019-12-22 MED ORDER — PALONOSETRON HCL INJECTION 0.25 MG/5ML
0.2500 mg | Freq: Once | INTRAVENOUS | Status: AC
  Administered 2019-12-22: 0.25 mg via INTRAVENOUS

## 2019-12-22 MED ORDER — DEXAMETHASONE SODIUM PHOSPHATE 10 MG/ML IJ SOLN: 10.0000 mg | Freq: Once | INTRAMUSCULAR | Status: DC

## 2019-12-22 MED ORDER — LEUCOVORIN CALCIUM INJECTION 350 MG
400.0000 mg/m2 | Freq: Once | INTRAVENOUS | Status: AC
  Administered 2019-12-22: 928 mg via INTRAVENOUS
  Filled 2019-12-22: qty 46.4

## 2019-12-22 MED ORDER — OXALIPLATIN CHEMO INJECTION 100 MG/20ML
86.0000 mg/m2 | Freq: Once | INTRAVENOUS | Status: AC
  Administered 2019-12-22: 200 mg via INTRAVENOUS
  Filled 2019-12-22: qty 40

## 2019-12-22 MED ORDER — IRINOTECAN HCL CHEMO INJECTION 100 MG/5ML
180.0000 mg/m2 | Freq: Once | INTRAVENOUS | Status: AC
  Administered 2019-12-22: 420 mg via INTRAVENOUS
  Filled 2019-12-22: qty 15

## 2019-12-22 MED ORDER — SODIUM CHLORIDE 0.9 % IV SOLN
10.0000 mg | Freq: Once | INTRAVENOUS | Status: AC
  Administered 2019-12-22: 10 mg via INTRAVENOUS
  Filled 2019-12-22: qty 10

## 2019-12-22 MED ORDER — SODIUM CHLORIDE 0.9 % IV SOLN
5000.0000 mg | INTRAVENOUS | Status: DC
  Administered 2019-12-22: 5000 mg via INTRAVENOUS
  Filled 2019-12-22: qty 100

## 2019-12-22 MED ORDER — SODIUM CHLORIDE 0.9 % IV SOLN
150.0000 mg | Freq: Once | INTRAVENOUS | Status: AC
  Administered 2019-12-22: 10:00:00 150 mg via INTRAVENOUS
  Filled 2019-12-22: qty 5

## 2019-12-22 MED ORDER — FLUOROURACIL CHEMO INJECTION 2.5 GM/50ML
400.0000 mg/m2 | Freq: Once | INTRAVENOUS | Status: AC
  Administered 2019-12-22: 950 mg via INTRAVENOUS
  Filled 2019-12-22: qty 19

## 2019-12-22 MED ORDER — ATROPINE SULFATE 1 MG/ML IJ SOLN
INTRAMUSCULAR | Status: AC
  Filled 2019-12-22: qty 1

## 2019-12-22 MED ORDER — HEPARIN SOD (PORK) LOCK FLUSH 100 UNIT/ML IV SOLN
500.0000 [IU] | Freq: Once | INTRAVENOUS | Status: DC | PRN
  Filled 2019-12-22: qty 5

## 2019-12-22 MED ORDER — ATROPINE SULFATE 1 MG/ML IJ SOLN
0.5000 mg | Freq: Once | INTRAMUSCULAR | Status: AC | PRN
  Administered 2019-12-22: 0.5 mg via INTRAVENOUS

## 2019-12-22 MED ORDER — LINACLOTIDE 145 MCG PO CAPS: 145.0000 ug | ORAL_CAPSULE | Freq: Every day | ORAL | 4 refills | Status: DC

## 2019-12-22 NOTE — Patient Instructions (Signed)
[facility] Discharge Instructions for Patients Receiving Chemotherapy  Today you received the following chemotherapy agents Oxaliplatin, Irrinotecan, Leukovorin, 5FU To help prevent nausea and vomiting after your treatment, we encourage you to take your nausea medication as directred   If you develop nausea and vomiting that is not controlled by your nausea medication, call the clinic.   BELOW ARE SYMPTOMS THAT SHOULD BE REPORTED IMMEDIATELY:  *FEVER GREATER THAN 100.5 F  *CHILLS WITH OR WITHOUT FEVER  NAUSEA AND VOMITING THAT IS NOT CONTROLLED WITH YOUR NAUSEA MEDICATION  *UNUSUAL SHORTNESS OF BREATH  *UNUSUAL BRUISING OR BLEEDING  TENDERNESS IN MOUTH AND THROAT WITH OR WITHOUT PRESENCE OF ULCERS  *URINARY PROBLEMS  *BOWEL PROBLEMS  UNUSUAL RASH Items with * indicate a potential emergency and should be followed up as soon as possible.  Feel free to call the clinic should you have any questions or concerns. The clinic phone number is [phone].  Please show the [facility] at check-in to the Emergency Department and triage nurse.

## 2019-12-22 NOTE — Patient Instructions (Signed)

## 2019-12-22 NOTE — Telephone Encounter (Signed)
Appointments scheduled calendar printed per 4/20 los

## 2019-12-22 NOTE — Progress Notes (Signed)
Hematology and Oncology Follow Up Visit  DMARIUS KEHL [id number] September 04, 1955 64 y.o. 12/22/2019   Principle Diagnosis:   Metastatic adenocarcinoma of the pancreas - hepatic mets  Current Therapy:    FOLFIRINOX -- s/p cycle #8 -- started on 08/11/2019     Interim History:  Mr. Atherton is back for for follow-up.  Unfortunately, now the problem is been constipation.  He did have issues with diarrhea.  Now he is incredibly constipated.  He had to go to the emergency room.  He also had urinary retention.  He has a Foley catheter in.  I am not sure why he has the urinary retention.  It is apparent that he is responding to treatment.  He had a CAT scan done on April 14 and this really showed continued response to treatment.  His last CA 19-9 was down to 1200.  We will try him on Linzess.  He is already taking lactulose, Colace, and MiraLAX.  He sees the urologist later on this week.  He is maintaining his weight.  He is eating okay.  He does not have any nausea or vomiting.  He has no rashes.  He has had no bleeding.  He and some friends will be going on a golfing trip in a week or so.  I really hope that he gets to enjoy this and participate.  I know this is very important for him.  Currently, his performance status is ECOG 1.   Medications:  Current Outpatient Medications:  .  dexamethasone (DECADRON) 4 MG tablet, Take 2 tablets (8 mg total) by mouth daily. Start the day after chemotherapy for 3 days. Take with food., Disp: 8 tablet, Rfl: 5 .  diphenoxylate-atropine (LOMOTIL) 2.5-0.025 MG tablet, Take 1-2 tablets by mouth 4 (four) times daily as needed for diarrhea or loose stools. (Patient not taking: Reported on 11/18/2019), Disp: 100 tablet, Rfl: 0 .  hydrocortisone (ANUSOL-HC) 25 MG suppository, PLACE 1 SUPPOSITORY (25 MG TOTAL) RECTALLY 2 (TWO) TIMES DAILY. FOR 7 DAYS, Disp: 14 suppository, Rfl: 0 .  KLOR-CON M20 20 MEQ tablet, TAKE 2 TABLETS BY MOUTH DAILY, Disp: 60 tablet, Rfl:  1 .  lactulose (CHRONULAC) 10 GM/15ML solution, SMARTSIG:40 Milliliter(s) By Mouth Morning-Evening, Disp: , Rfl:  .  lipase/protease/amylase (CREON) 36000 UNITS CPEP capsule, Take 2 capsules (72,000 Units total) by mouth 3 (three) times daily before meals., Disp: 180 capsule, Rfl: 6 .  loperamide (IMODIUM A-D) 2 MG tablet, Take 2 at onset of diarrhea, then 1 every 2hrs until 12hr without a BM. May take 2 tab every 4hrs at bedtime. If diarrhea recurs repeat. (Patient not taking: Reported on 09/29/2019), Disp: 100 tablet, Rfl: 1 .  LORazepam (ATIVAN) 0.5 MG tablet, Take 1 tablet (0.5 mg total) by mouth every 6 (six) hours as needed for anxiety. (Patient not taking: Reported on 12/02/2019), Disp: 30 tablet, Rfl: 0 .  ondansetron (ZOFRAN) 8 MG tablet, Take 1 tablet (8 mg total) by mouth 2 (two) times daily as needed. Start on day 3 after chemotherapy. (Patient not taking: Reported on 11/18/2019), Disp: 30 tablet, Rfl: 1 .  oxyCODONE-acetaminophen (PERCOCET/ROXICET) 5-325 MG tablet, Take 1 tablet by mouth every 6 (six) hours as needed for severe pain., Disp: , Rfl:  .  pantoprazole (PROTONIX) 40 MG tablet, TAKE 1 TABLET BY MOUTH TWICE A DAY, Disp: 60 tablet, Rfl: 3 .  prochlorperazine (COMPAZINE) 10 MG tablet, Take 1 tablet (10 mg total) by mouth every 6 (six) hours as needed (Nausea or vomiting). (  Patient not taking: Reported on 09/29/2019), Disp: 30 tablet, Rfl: 1 .  zolpidem (AMBIEN) 5 MG tablet, Take 1 tablet (5 mg total) by mouth at bedtime as needed for sleep. (Patient not taking: Reported on 10/27/2019), Disp: 30 tablet, Rfl: 1 No current facility-administered medications for this visit.  Facility-Administered Medications Ordered in Other Visits:  .  sodium chloride flush (NS) 0.9 % injection 10 mL, 10 mL, Intravenous, PRN, Volanda Napoleon, MD, 10 mL at 09/07/19 1245  Allergies: No Known Allergies  Past Medical History, Surgical history, Social history, and Family History were reviewed and  updated.  Review of Systems: Review of Systems  Constitutional: Positive for appetite change and unexpected weight change.  HENT:  Negative.   Eyes: Positive for icterus.  Respiratory: Negative.   Cardiovascular: Negative.   Gastrointestinal: Positive for abdominal pain and constipation.  Endocrine: Negative.   Genitourinary: Negative.    Musculoskeletal: Negative.   Skin: Negative.   Neurological: Negative.   Hematological: Negative.   Psychiatric/Behavioral: Negative.     Physical Exam:  weight is 196 lb 12 oz (89.2 kg).   Wt Readings from Last 3 Encounters:  12/22/19 196 lb 12 oz (89.2 kg)  12/22/19 196 lb 1.9 oz (89 kg)  12/11/19 196 lb 1.3 oz (88.9 kg)    Physical Exam Vitals reviewed.  Constitutional:      Comments: Overall he is a well-developed and well-nourished white male.  He does not have scleral icterus.  He does not have any palatal jaundice.  There is no jaundice with his skin.  HENT:     Head: Normocephalic and atraumatic.  Eyes:     Pupils: Pupils are equal, round, and reactive to light.  Cardiovascular:     Rate and Rhythm: Normal rate and regular rhythm.     Heart sounds: Normal heart sounds.  Pulmonary:     Effort: Pulmonary effort is normal.     Breath sounds: Normal breath sounds.  Abdominal:     General: Bowel sounds are normal.     Palpations: Abdomen is soft.     Comments: His abdomen is soft.  He has good bowel sounds.  The area on the right side of the abdomen is fully healed.  There is a little bit of erythema.  There is no swelling.  There clearly is no exudate.  There is no palpable abdominal mass.  There is no fluid wave.  There is no palpable liver or spleen tip.  Musculoskeletal:        General: No tenderness or deformity. Normal range of motion.     Cervical back: Normal range of motion.  Lymphadenopathy:     Cervical: No cervical adenopathy.  Skin:    General: Skin is warm and dry.     Coloration: Skin is jaundiced.      Findings: No erythema or rash.  Neurological:     Mental Status: He is alert and oriented to person, place, and time.  Psychiatric:        Behavior: Behavior normal.        Thought Content: Thought content normal.        Judgment: Judgment normal.      Lab Results  Component Value Date   WBC 3.3 (L) 12/22/2019   HGB 9.2 (L) 12/22/2019   HCT 27.8 (L) 12/22/2019   MCV 106.5 (H) 12/22/2019   PLT 111 (L) 12/22/2019     Chemistry      Component Value Date/Time   NA  137 12/22/2019 0800   K 3.3 (L) 12/22/2019 0800   CL 100 12/22/2019 0800   CO2 30 12/22/2019 0800   BUN 5 (L) 12/22/2019 0800   CREATININE 0.47 (L) 12/22/2019 0800      Component Value Date/Time   CALCIUM 8.7 (L) 12/22/2019 0800   ALKPHOS 162 (H) 12/22/2019 0800   AST 24 12/22/2019 0800   ALT 21 12/22/2019 0800   BILITOT 0.3 12/22/2019 0800       Impression and Plan: Mr. Hellen is a 64 year old white male.  He has metastatic pancreatic cancer.  He has a very markedly elevated CA 19-9.  I am puzzled about the urinary retention.  I also puzzled as to why he has the constipation.  He really is not taking pain medication.  He actually had no the CAT scan done which did not show anything down the pelvis.  Hopefully, the Linzess will help.  Again quality of life is key.  I want to make sure that his quality of life is the primary goal.  Hopefully the Foley catheter will come out after he sees the urologist.  We will proceed with his ninth cycle of treatment.  We will then plan to get him back in 2 weeks for follow-up.    Volanda Napoleon, MD 4/20/20219:31 AM

## 2019-12-23 ENCOUNTER — Other Ambulatory Visit: Payer: Self-pay | Admitting: Hematology & Oncology

## 2019-12-23 ENCOUNTER — Encounter: Payer: Self-pay | Admitting: *Deleted

## 2019-12-23 DIAGNOSIS — C787 Secondary malignant neoplasm of liver and intrahepatic bile duct: Secondary | ICD-10-CM

## 2019-12-23 DIAGNOSIS — C259 Malignant neoplasm of pancreas, unspecified: Secondary | ICD-10-CM

## 2019-12-23 LAB — CANCER ANTIGEN 19-9: CA 19-9: 1042 U/mL — ABNORMAL HIGH (ref 0–35)

## 2019-12-24 ENCOUNTER — Inpatient Hospital Stay: Payer: BC Managed Care – PPO

## 2019-12-24 ENCOUNTER — Other Ambulatory Visit: Payer: Self-pay

## 2019-12-24 VITALS — BP 104/60 | HR 82 | Temp 96.8°F | Resp 17

## 2019-12-24 DIAGNOSIS — R339 Retention of urine, unspecified: Secondary | ICD-10-CM | POA: Diagnosis not present

## 2019-12-24 DIAGNOSIS — C259 Malignant neoplasm of pancreas, unspecified: Secondary | ICD-10-CM

## 2019-12-24 DIAGNOSIS — Z79899 Other long term (current) drug therapy: Secondary | ICD-10-CM | POA: Diagnosis not present

## 2019-12-24 DIAGNOSIS — N400 Enlarged prostate without lower urinary tract symptoms: Secondary | ICD-10-CM | POA: Diagnosis not present

## 2019-12-24 DIAGNOSIS — R338 Other retention of urine: Secondary | ICD-10-CM | POA: Diagnosis not present

## 2019-12-24 DIAGNOSIS — Z5111 Encounter for antineoplastic chemotherapy: Secondary | ICD-10-CM | POA: Diagnosis not present

## 2019-12-24 DIAGNOSIS — C250 Malignant neoplasm of head of pancreas: Secondary | ICD-10-CM | POA: Diagnosis not present

## 2019-12-24 DIAGNOSIS — C787 Secondary malignant neoplasm of liver and intrahepatic bile duct: Secondary | ICD-10-CM

## 2019-12-24 DIAGNOSIS — Z7952 Long term (current) use of systemic steroids: Secondary | ICD-10-CM | POA: Diagnosis not present

## 2019-12-24 DIAGNOSIS — Z5189 Encounter for other specified aftercare: Secondary | ICD-10-CM | POA: Diagnosis not present

## 2019-12-24 DIAGNOSIS — K5900 Constipation, unspecified: Secondary | ICD-10-CM | POA: Diagnosis not present

## 2019-12-24 MED ORDER — PEGFILGRASTIM-JMDB 6 MG/0.6ML ~~LOC~~ SOSY
PREFILLED_SYRINGE | SUBCUTANEOUS | Status: AC
  Filled 2019-12-24: qty 0.6

## 2019-12-24 MED ORDER — PEGFILGRASTIM-JMDB 6 MG/0.6ML ~~LOC~~ SOSY
6.0000 mg | PREFILLED_SYRINGE | Freq: Once | SUBCUTANEOUS | Status: AC
  Administered 2019-12-24: 6 mg via SUBCUTANEOUS

## 2019-12-24 MED ORDER — HEPARIN SOD (PORK) LOCK FLUSH 100 UNIT/ML IV SOLN
500.0000 [IU] | Freq: Once | INTRAVENOUS | Status: AC | PRN
  Administered 2019-12-24: 500 [IU]
  Filled 2019-12-24: qty 5

## 2019-12-24 MED ORDER — SODIUM CHLORIDE 0.9% FLUSH
10.0000 mL | INTRAVENOUS | Status: DC | PRN
  Administered 2019-12-24: 10 mL
  Filled 2019-12-24: qty 10

## 2019-12-24 NOTE — Patient Instructions (Signed)
Fluorouracil, 5-FU injection What is this medicine? FLUOROURACIL, 5-FU (flure oh YOOR a sil) is a chemotherapy drug. It slows the growth of cancer cells. This medicine is used to treat many types of cancer like breast cancer, colon or rectal cancer, pancreatic cancer, and stomach cancer. This medicine may be used for other purposes; ask your health care provider or pharmacist if you have questions. COMMON BRAND NAME(S): Adrucil What should I tell my health care provider before I take this medicine? They need to know if you have any of these conditions:  blood disorders  dihydropyrimidine dehydrogenase (DPD) deficiency  infection (especially a virus infection such as chickenpox, cold sores, or herpes)  kidney disease  liver disease  malnourished, poor nutrition  recent or ongoing radiation therapy  an unusual or allergic reaction to fluorouracil, other chemotherapy, other medicines, foods, dyes, or preservatives  pregnant or trying to get pregnant  breast-feeding How should I use this medicine? This drug is given as an infusion or injection into a vein. It is administered in a hospital or clinic by a specially trained health care professional. Talk to your pediatrician regarding the use of this medicine in children. Special care may be needed. Overdosage: If you think you have taken too much of this medicine contact a poison control center or emergency room at once. NOTE: This medicine is only for you. Do not share this medicine with others. What if I miss a dose? It is important not to miss your dose. Call your doctor or health care professional if you are unable to keep an appointment. What may interact with this medicine?  allopurinol  cimetidine  dapsone  digoxin  hydroxyurea  leucovorin  levamisole  medicines for seizures like ethotoin, fosphenytoin, phenytoin  medicines to increase blood counts like filgrastim, pegfilgrastim, sargramostim  medicines that  treat or prevent blood clots like warfarin, enoxaparin, and dalteparin  methotrexate  metronidazole  pyrimethamine  some other chemotherapy drugs like busulfan, cisplatin, estramustine, vinblastine  trimethoprim  trimetrexate  vaccines Talk to your doctor or health care professional before taking any of these medicines:  acetaminophen  aspirin  ibuprofen  ketoprofen  naproxen This list may not describe all possible interactions. Give your health care provider a list of all the medicines, herbs, non-prescription drugs, or dietary supplements you use. Also tell them if you smoke, drink alcohol, or use illegal drugs. Some items may interact with your medicine. What should I watch for while using this medicine? Visit your doctor for checks on your progress. This drug may make you feel generally unwell. This is not uncommon, as chemotherapy can affect healthy cells as well as cancer cells. Report any side effects. Continue your course of treatment even though you feel ill unless your doctor tells you to stop. In some cases, you may be given additional medicines to help with side effects. Follow all directions for their use. Call your doctor or health care professional for advice if you get a fever, chills or sore throat, or other symptoms of a cold or flu. Do not treat yourself. This drug decreases your body's ability to fight infections. Try to avoid being around people who are sick. This medicine may increase your risk to bruise or bleed. Call your doctor or health care professional if you notice any unusual bleeding. Be careful brushing and flossing your teeth or using a toothpick because you may get an infection or bleed more easily. If you have any dental work done, tell your dentist you are  receiving this medicine. Avoid taking products that contain aspirin, acetaminophen, ibuprofen, naproxen, or ketoprofen unless instructed by your doctor. These medicines may hide a fever. Do not  become pregnant while taking this medicine. Women should inform their doctor if they wish to become pregnant or think they might be pregnant. There is a potential for serious side effects to an unborn child. Talk to your health care professional or pharmacist for more information. Do not breast-feed an infant while taking this medicine. Men should inform their doctor if they wish to father a child. This medicine may lower sperm counts. Do not treat diarrhea with over the counter products. Contact your doctor if you have diarrhea that lasts more than 2 days or if it is severe and watery. This medicine can make you more sensitive to the sun. Keep out of the sun. If you cannot avoid being in the sun, wear protective clothing and use sunscreen. Do not use sun lamps or tanning beds/booths. What side effects may I notice from receiving this medicine? Side effects that you should report to your doctor or health care professional as soon as possible:  allergic reactions like skin rash, itching or hives, swelling of the face, lips, or tongue  low blood counts - this medicine may decrease the number of white blood cells, red blood cells and platelets. You may be at increased risk for infections and bleeding.  signs of infection - fever or chills, cough, sore throat, pain or difficulty passing urine  signs of decreased platelets or bleeding - bruising, pinpoint red spots on the skin, black, tarry stools, blood in the urine  signs of decreased red blood cells - unusually weak or tired, fainting spells, lightheadedness  breathing problems  changes in vision  chest pain  mouth sores  nausea and vomiting  pain, swelling, redness at site where injected  pain, tingling, numbness in the hands or feet  redness, swelling, or sores on hands or feet  stomach pain  unusual bleeding Side effects that usually do not require medical attention (report to your doctor or health care professional if they  continue or are bothersome):  changes in finger or toe nails  diarrhea  dry or itchy skin  hair loss  headache  loss of appetite  sensitivity of eyes to the light  stomach upset  unusually teary eyes This list may not describe all possible side effects. Call your doctor for medical advice about side effects. You may report side effects to FDA at 1-800-FDA-1088. Where should I keep my medicine? This drug is given in a hospital or clinic and will not be stored at home. NOTE: This sheet is a summary. It may not cover all possible information. If you have questions about this medicine, talk to your doctor, pharmacist, or health care provider.  2020 Elsevier/Gold Standard (2007-12-24 13:53:16) Pegfilgrastim injection What is this medicine? PEGFILGRASTIM (PEG fil gra stim) is a long-acting granulocyte colony-stimulating factor that stimulates the growth of neutrophils, a type of white blood cell important in the body's fight against infection. It is used to reduce the incidence of fever and infection in patients with certain types of cancer who are receiving chemotherapy that affects the bone marrow, and to increase survival after being exposed to high doses of radiation. This medicine may be used for other purposes; ask your health care provider or pharmacist if you have questions. COMMON BRAND NAME(S): Steve Rattler, Ziextenzo What should I tell my health care provider before I take this  medicine? They need to know if you have any of these conditions:  kidney disease  latex allergy  ongoing radiation therapy  sickle cell disease  skin reactions to acrylic adhesives (On-Body Injector only)  an unusual or allergic reaction to pegfilgrastim, filgrastim, other medicines, foods, dyes, or preservatives  pregnant or trying to get pregnant  breast-feeding How should I use this medicine? This medicine is for injection under the skin. If you get this medicine at home,  you will be taught how to prepare and give the pre-filled syringe or how to use the On-body Injector. Refer to the patient Instructions for Use for detailed instructions. Use exactly as directed. Tell your healthcare provider immediately if you suspect that the On-body Injector may not have performed as intended or if you suspect the use of the On-body Injector resulted in a missed or partial dose. It is important that you put your used needles and syringes in a special sharps container. Do not put them in a trash can. If you do not have a sharps container, call your pharmacist or healthcare provider to get one. Talk to your pediatrician regarding the use of this medicine in children. While this drug may be prescribed for selected conditions, precautions do apply. Overdosage: If you think you have taken too much of this medicine contact a poison control center or emergency room at once. NOTE: This medicine is only for you. Do not share this medicine with others. What if I miss a dose? It is important not to miss your dose. Call your doctor or health care professional if you miss your dose. If you miss a dose due to an On-body Injector failure or leakage, a new dose should be administered as soon as possible using a single prefilled syringe for manual use. What may interact with this medicine? Interactions have not been studied. Give your health care provider a list of all the medicines, herbs, non-prescription drugs, or dietary supplements you use. Also tell them if you smoke, drink alcohol, or use illegal drugs. Some items may interact with your medicine. This list may not describe all possible interactions. Give your health care provider a list of all the medicines, herbs, non-prescription drugs, or dietary supplements you use. Also tell them if you smoke, drink alcohol, or use illegal drugs. Some items may interact with your medicine. What should I watch for while using this medicine? You may need  blood work done while you are taking this medicine. If you are going to need a MRI, CT scan, or other procedure, tell your doctor that you are using this medicine (On-Body Injector only). What side effects may I notice from receiving this medicine? Side effects that you should report to your doctor or health care professional as soon as possible:  allergic reactions like skin rash, itching or hives, swelling of the face, lips, or tongue  back pain  dizziness  fever  pain, redness, or irritation at site where injected  pinpoint red spots on the skin  red or dark-brown urine  shortness of breath or breathing problems  stomach or side pain, or pain at the shoulder  swelling  tiredness  trouble passing urine or change in the amount of urine Side effects that usually do not require medical attention (report to your doctor or health care professional if they continue or are bothersome):  bone pain  muscle pain This list may not describe all possible side effects. Call your doctor for medical advice about side effects. You  may report side effects to FDA at 1-800-FDA-1088. Where should I keep my medicine? Keep out of the reach of children. If you are using this medicine at home, you will be instructed on how to store it. Throw away any unused medicine after the expiration date on the label. NOTE: This sheet is a summary. It may not cover all possible information. If you have questions about this medicine, talk to your doctor, pharmacist, or health care provider.  2020 Elsevier/Gold Standard (2017-11-25 16:57:08)

## 2019-12-28 ENCOUNTER — Encounter: Payer: Self-pay | Admitting: Hematology & Oncology

## 2019-12-28 DIAGNOSIS — E876 Hypokalemia: Secondary | ICD-10-CM | POA: Diagnosis not present

## 2019-12-29 NOTE — Progress Notes (Unsigned)
Pharmacist Chemotherapy Monitoring - Follow Up Assessment    I verify that I have reviewed each item in the below checklist:  . Regimen for the patient is scheduled for the appropriate day and plan matches scheduled date. [name] Appropriate non-routine labs are ordered dependent on drug ordered. . If applicable, additional medications reviewed and ordered per protocol based on lifetime cumulative doses and/or treatment regimen.   Plan for follow-up and/or issues identified: No . I-vent associated with next due treatment: No . MD and/or nursing notified: No  Rhett Mutschler, [name] 12/29/2019 8:09 AM

## 2019-12-30 ENCOUNTER — Ambulatory Visit: Payer: BC Managed Care – PPO | Admitting: Hematology & Oncology

## 2019-12-30 ENCOUNTER — Ambulatory Visit: Payer: BC Managed Care – PPO

## 2019-12-30 ENCOUNTER — Other Ambulatory Visit: Payer: BC Managed Care – PPO

## 2020-01-01 ENCOUNTER — Encounter: Payer: Self-pay | Admitting: Genetic Counselor

## 2020-01-01 ENCOUNTER — Ambulatory Visit: Payer: BC Managed Care – PPO

## 2020-01-01 DIAGNOSIS — Z1379 Encounter for other screening for genetic and chromosomal anomalies: Secondary | ICD-10-CM | POA: Insufficient documentation

## 2020-01-04 DIAGNOSIS — K602 Anal fissure, unspecified: Secondary | ICD-10-CM | POA: Diagnosis not present

## 2020-01-04 DIAGNOSIS — K625 Hemorrhage of anus and rectum: Secondary | ICD-10-CM | POA: Diagnosis not present

## 2020-01-04 DIAGNOSIS — C259 Malignant neoplasm of pancreas, unspecified: Secondary | ICD-10-CM | POA: Diagnosis not present

## 2020-01-04 DIAGNOSIS — K5900 Constipation, unspecified: Secondary | ICD-10-CM | POA: Diagnosis not present

## 2020-01-05 ENCOUNTER — Inpatient Hospital Stay: Payer: BC Managed Care – PPO

## 2020-01-05 ENCOUNTER — Encounter: Payer: Self-pay | Admitting: Family

## 2020-01-05 ENCOUNTER — Telehealth: Payer: Self-pay | Admitting: Family

## 2020-01-05 ENCOUNTER — Inpatient Hospital Stay: Payer: BC Managed Care – PPO | Attending: Hematology & Oncology

## 2020-01-05 ENCOUNTER — Other Ambulatory Visit: Payer: Self-pay

## 2020-01-05 ENCOUNTER — Encounter: Payer: Self-pay | Admitting: *Deleted

## 2020-01-05 ENCOUNTER — Inpatient Hospital Stay (HOSPITAL_BASED_OUTPATIENT_CLINIC_OR_DEPARTMENT_OTHER): Payer: BC Managed Care – PPO | Admitting: Family

## 2020-01-05 ENCOUNTER — Telehealth: Payer: Self-pay | Admitting: *Deleted

## 2020-01-05 VITALS — BP 97/73 | HR 89 | Temp 97.3°F | Resp 18 | Ht 71.0 in | Wt 192.0 lb

## 2020-01-05 DIAGNOSIS — R399 Unspecified symptoms and signs involving the genitourinary system: Secondary | ICD-10-CM

## 2020-01-05 DIAGNOSIS — R978 Other abnormal tumor markers: Secondary | ICD-10-CM | POA: Insufficient documentation

## 2020-01-05 DIAGNOSIS — Z0120 Encounter for dental examination and cleaning without abnormal findings: Secondary | ICD-10-CM

## 2020-01-05 DIAGNOSIS — R300 Dysuria: Secondary | ICD-10-CM | POA: Diagnosis not present

## 2020-01-05 DIAGNOSIS — K5900 Constipation, unspecified: Secondary | ICD-10-CM

## 2020-01-05 DIAGNOSIS — C787 Secondary malignant neoplasm of liver and intrahepatic bile duct: Secondary | ICD-10-CM | POA: Diagnosis not present

## 2020-01-05 DIAGNOSIS — K921 Melena: Secondary | ICD-10-CM | POA: Diagnosis not present

## 2020-01-05 DIAGNOSIS — C250 Malignant neoplasm of head of pancreas: Secondary | ICD-10-CM | POA: Insufficient documentation

## 2020-01-05 DIAGNOSIS — D696 Thrombocytopenia, unspecified: Secondary | ICD-10-CM | POA: Insufficient documentation

## 2020-01-05 DIAGNOSIS — E876 Hypokalemia: Secondary | ICD-10-CM | POA: Diagnosis not present

## 2020-01-05 DIAGNOSIS — Z5111 Encounter for antineoplastic chemotherapy: Secondary | ICD-10-CM | POA: Diagnosis not present

## 2020-01-05 DIAGNOSIS — C259 Malignant neoplasm of pancreas, unspecified: Secondary | ICD-10-CM

## 2020-01-05 LAB — CBC WITH DIFFERENTIAL (CANCER CENTER ONLY)
Abs Immature Granulocytes: 0.06 10*3/uL (ref 0.00–0.07)
Basophils Absolute: 0 10*3/uL (ref 0.0–0.1)
Basophils Relative: 1 %
Eosinophils Absolute: 0 10*3/uL (ref 0.0–0.5)
Eosinophils Relative: 0 %
HCT: 25.3 % — ABNORMAL LOW (ref 39.0–52.0)
Hemoglobin: 8.6 g/dL — ABNORMAL LOW (ref 13.0–17.0)
Immature Granulocytes: 1 %
Lymphocytes Relative: 28 %
Lymphs Abs: 1.2 10*3/uL (ref 0.7–4.0)
MCH: 35.7 pg — ABNORMAL HIGH (ref 26.0–34.0)
MCHC: 34 g/dL (ref 30.0–36.0)
MCV: 105 fL — ABNORMAL HIGH (ref 80.0–100.0)
Monocytes Absolute: 0.5 10*3/uL (ref 0.1–1.0)
Monocytes Relative: 11 %
Neutro Abs: 2.4 10*3/uL (ref 1.7–7.7)
Neutrophils Relative %: 59 %
Platelet Count: 83 10*3/uL — ABNORMAL LOW (ref 150–400)
RBC: 2.41 MIL/uL — ABNORMAL LOW (ref 4.22–5.81)
RDW: 15 % (ref 11.5–15.5)
WBC Count: 4.2 10*3/uL (ref 4.0–10.5)
nRBC: 0 % (ref 0.0–0.2)

## 2020-01-05 LAB — URINALYSIS, COMPLETE (UACMP) WITH MICROSCOPIC
Bilirubin Urine: NEGATIVE
Glucose, UA: NEGATIVE mg/dL
Ketones, ur: NEGATIVE mg/dL
Nitrite: NEGATIVE
Protein, ur: >300 mg/dL — AB
RBC / HPF: >50 RBC/hpf (ref 0–5)
Specific Gravity, Urine: >1.03 — ABNORMAL HIGH (ref 1.005–1.030)
WBC, UA: >50 WBC/hpf (ref 0–5)
pH: 6 (ref 5.0–8.0)

## 2020-01-05 LAB — CMP (CANCER CENTER ONLY)
ALT: 38 U/L (ref 0–44)
AST: 27 U/L (ref 15–41)
Albumin: 3.5 g/dL (ref 3.5–5.0)
Alkaline Phosphatase: 179 U/L — ABNORMAL HIGH (ref 38–126)
Anion gap: 11 (ref 5–15)
BUN: 11 mg/dL (ref 8–23)
CO2: 26 mmol/L (ref 22–32)
Calcium: 8.1 mg/dL — ABNORMAL LOW (ref 8.9–10.3)
Chloride: 103 mmol/L (ref 98–111)
Creatinine: 0.64 mg/dL (ref 0.61–1.24)
GFR, Est AFR Am: >60 mL/min (ref >60)
GFR, Estimated: >60 mL/min (ref >60)
Glucose, Bld: 185 mg/dL — ABNORMAL HIGH (ref 70–99)
Potassium: 2.5 mmol/L — CL (ref 3.5–5.1)
Sodium: 140 mmol/L (ref 135–145)
Total Bilirubin: 0.2 mg/dL — ABNORMAL LOW (ref 0.3–1.2)
Total Protein: 6.6 g/dL (ref 6.5–8.1)

## 2020-01-05 MED ORDER — LACTULOSE 10 GM/15ML PO SOLN: ORAL | 1 refills | Status: DC

## 2020-01-05 MED ORDER — POTASSIUM CHLORIDE CRYS ER 20 MEQ PO TBCR: 40.0000 meq | EXTENDED_RELEASE_TABLET | Freq: Two times a day (BID) | ORAL | 1 refills | Status: DC

## 2020-01-05 MED ORDER — AMOXICILLIN 500 MG PO TABS: 500.0000 mg | ORAL_TABLET | Freq: Once | ORAL | 0 refills | Status: DC

## 2020-01-05 MED ORDER — AMOXICILLIN 500 MG PO TABS: 2000.0000 mg | ORAL_TABLET | Freq: Once | ORAL | 0 refills | Status: AC

## 2020-01-05 NOTE — Telephone Encounter (Signed)
Appointments scheduled calendar printed per 5/4 los 

## 2020-01-05 NOTE — Progress Notes (Signed)
Hematology and Oncology Follow Up Visit  Jeffrey Harris [id number] [birth year] 64 y.o. 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 He is doing fairly well but after recently having his catheter removed he has had burning when urinating and has noted that his urine looks cloudy. He states that he was treated with an antibiotic when they removed the catheter but has still developed these symptoms.  CA 19-9 last month was 1,042.  No fever, chills, n/v, cough, rash, dizziness, SOB, chest pain, palpitations, abdominal pain or changes in bowel or bladder habits.  He has issues with constipation and occasional bright red blood in his stool due to an anal fissure. No other blood loss noted. No petechiae or abnormal bruising.  He states that the Lactulose has helped and he was also given a cream to use daily.  No swelling, tenderness, numbness or tingling in his extremities at this time.  No falls or syncopal episodes to report.  He states that he has a good appetite and is staying well hydrated. His weight is stable.   ECOG Performance Status: 1 - Symptomatic but completely ambulatory  Medications:  Allergies as of 01/05/2020   No Known Allergies     Medication List       Accurate as of Jan 05, 2020  8:47 AM. If you have any questions, ask your nurse or doctor.        dexamethasone 4 MG tablet Commonly known as: DECADRON TAKE 2 TABLETS BY MOUTH DAILY. START THE DAY AFTER CHEMOTHERAPY FOR 3 DAYS. TAKE WITH FOOD.   diphenoxylate-atropine 2.5-0.025 MG tablet Commonly known as: LOMOTIL Take 1-2 tablets by mouth 4 (four) times daily as needed for diarrhea or loose stools.   hydrocortisone 25 MG suppository Commonly known as: ANUSOL-HC PLACE 1 SUPPOSITORY (25 MG TOTAL) RECTALLY 2 (TWO) TIMES DAILY.  FOR 7 DAYS   Klor-Con M20 20 MEQ tablet Generic drug: potassium chloride SA TAKE 2 TABLETS BY MOUTH DAILY   lactulose 10 GM/15ML solution Commonly known as: CHRONULAC SMARTSIG:40 Milliliter(s) By Mouth Morning-Evening   linaclotide 145 MCG Caps capsule Commonly known as: Linzess Take 1 capsule (145 mcg total) by mouth daily before breakfast.   lipase/protease/amylase 36000 UNITS Cpep capsule Commonly known as: Creon Take 2 capsules (72,000 Units total) by mouth 3 (three) times daily before meals.   loperamide 2 MG tablet Commonly known as: Imodium A-D Take 2 at onset of diarrhea, then 1 every 2hrs until 12hr without a BM. May take 2 tab every 4hrs at bedtime. If diarrhea recurs repeat.   LORazepam 0.5 MG tablet Commonly known as: Ativan Take 1 tablet (0.5 mg total) by mouth every 6 (six) hours as needed for anxiety.   ondansetron 8 MG tablet Commonly known as: Zofran Take 1 tablet (8 mg total) by mouth 2 (two) times daily as needed. Start on day 3 after chemotherapy.   oxyCODONE-acetaminophen 5-325 MG tablet Commonly known as: PERCOCET/ROXICET Take 1 tablet by mouth every 6 (six) hours as needed for severe pain.   pantoprazole 40 MG tablet Commonly known as: PROTONIX TAKE 1 TABLET BY MOUTH TWICE A DAY   prochlorperazine 10 MG tablet Commonly known as: COMPAZINE Take 1 tablet (10 mg total) by mouth every 6 (six) hours as needed (Nausea or vomiting).  zolpidem 5 MG tablet Commonly known as: AMBIEN Take 1 tablet (5 mg total) by mouth at bedtime as needed for sleep.       Allergies: No Known Allergies  Past Medical History, Surgical history, Social history, and Family History were reviewed and updated.  Review of Systems: All other 10 point review of systems is negative.   Physical Exam:  height is 5\' 11"  (1.803 m) and weight is 192 lb 0.6 oz (87.1 kg). His temporal temperature is 97.3 F (36.3 C) (abnormal). His blood pressure is 97/73 and his pulse is 89. His  respiration is 18 and oxygen saturation is 100%.   Wt Readings from Last 3 Encounters:  01/05/20 192 lb 0.6 oz (87.1 kg)  12/22/19 196 lb 12 oz (89.2 kg)  12/22/19 196 lb 1.9 oz (89 kg)    Ocular: Sclerae unicteric, pupils equal, round and reactive to light Ear-nose-throat: Oropharynx clear, dentition fair Lymphatic: No cervical or supraclavicular adenopathy Lungs no rales or rhonchi, good excursion bilaterally Heart regular rate and rhythm, no murmur appreciated Abd soft, nontender, positive bowel sounds, no liver or spleen tip palpated on exam, no fluid wave  MSK no focal spinal tenderness, no joint edema Neuro: non-focal, well-oriented, appropriate affect Breasts: Deferred   Lab Results  Component Value Date   WBC 3.3 (L) 12/22/2019   HGB 9.2 (L) 12/22/2019   HCT 27.8 (L) 12/22/2019   MCV 106.5 (H) 12/22/2019   PLT 111 (L) 12/22/2019   Lab Results  Component Value Date   FERRITIN 1,046 (H) 12/02/2019   IRON 97 12/02/2019   TIBC 244 12/02/2019   UIBC 146 12/02/2019   IRONPCTSAT 40 12/02/2019   Lab Results  Component Value Date   RBC 2.61 (L) 12/22/2019   No results found for: KPAFRELGTCHN, LAMBDASER, KAPLAMBRATIO No results found for: IGGSERUM, IGA, IGMSERUM No results found for: TOTALPROTELP, ALBUMINELP, A1GS, A2GS, BETS, BETA2SER, GAMS, MSPIKE, SPEI   Chemistry      Component Value Date/Time   NA 137 12/22/2019 0800   K 3.3 (L) 12/22/2019 0800   CL 100 12/22/2019 0800   CO2 30 12/22/2019 0800   BUN 5 (L) 12/22/2019 0800   CREATININE 0.47 (L) 12/22/2019 0800      Component Value Date/Time   CALCIUM 8.7 (L) 12/22/2019 0800   ALKPHOS 162 (H) 12/22/2019 0800   AST 24 12/22/2019 0800   ALT 21 12/22/2019 0800   BILITOT 0.3 12/22/2019 0800       Impression and Plan: Jeffrey Harris is a very pleasant 64 yo caucasian gentleman with metastatic pancreatic cancer with a markedly elevated CA 19-9.  UA and culture pending. We will treat for UTI if needed.  I spoke with  Dr. Marin Olp about his platelet count and potassium level and we will hold treatment today.  He will increase his potassium to 40 meq BID. Prescription changed.  Lactulose refilled.  We will plan to see him back in another week and resume treatment at that time if his lab work has improved.  He will be having his teeth cleaned later this week and will take Amoxacillin 2 gm 30 minutes before for prophylaxis.  He promises to contact our office with any questions or concerns. We can certainly see him sooner if needed.   Laverna Peace, NP 5/4/20218:47 AM

## 2020-01-05 NOTE — Telephone Encounter (Signed)
Jory Ee NP notified of potassium-2.5.  Pt to increase potassium to 40 meq PO BID per order of S. [place] NP.

## 2020-01-06 LAB — CANCER ANTIGEN 19-9: CA 19-9: 956 U/mL — ABNORMAL HIGH (ref 0–35)

## 2020-01-06 NOTE — Progress Notes (Signed)
Pharmacist Chemotherapy Monitoring - Follow Up Assessment    I verify that I have reviewed each item in the below checklist:  . Regimen for the patient is scheduled for the appropriate day and plan matches scheduled date. [name] Appropriate non-routine labs are ordered dependent on drug ordered. . If applicable, additional medications reviewed and ordered per protocol based on lifetime cumulative doses and/or treatment regimen.   Plan for follow-up and/or issues identified: No . I-vent associated with next due treatment: No . MD and/or nursing notified: No  Jeffrey Harris, [name] 01/06/2020 7:51 AM

## 2020-01-07 ENCOUNTER — Ambulatory Visit: Payer: BC Managed Care – PPO

## 2020-01-07 ENCOUNTER — Encounter: Payer: Self-pay | Admitting: *Deleted

## 2020-01-07 ENCOUNTER — Inpatient Hospital Stay: Payer: BC Managed Care – PPO

## 2020-01-08 ENCOUNTER — Other Ambulatory Visit: Payer: Self-pay | Admitting: Family

## 2020-01-08 DIAGNOSIS — N3000 Acute cystitis without hematuria: Secondary | ICD-10-CM

## 2020-01-08 LAB — URINE CULTURE: Culture: >=100000 — AB

## 2020-01-08 MED ORDER — SULFAMETHOXAZOLE-TRIMETHOPRIM 800-160 MG PO TABS: 1.0000 | ORAL_TABLET | Freq: Two times a day (BID) | ORAL | 0 refills | Status: DC

## 2020-01-12 ENCOUNTER — Other Ambulatory Visit: Payer: Self-pay | Admitting: Hematology & Oncology

## 2020-01-12 DIAGNOSIS — E876 Hypokalemia: Secondary | ICD-10-CM

## 2020-01-12 DIAGNOSIS — C787 Secondary malignant neoplasm of liver and intrahepatic bile duct: Secondary | ICD-10-CM

## 2020-01-12 DIAGNOSIS — C259 Malignant neoplasm of pancreas, unspecified: Secondary | ICD-10-CM

## 2020-01-13 ENCOUNTER — Inpatient Hospital Stay: Payer: BC Managed Care – PPO

## 2020-01-13 ENCOUNTER — Other Ambulatory Visit: Payer: Self-pay

## 2020-01-13 DIAGNOSIS — R978 Other abnormal tumor markers: Secondary | ICD-10-CM | POA: Diagnosis not present

## 2020-01-13 DIAGNOSIS — K921 Melena: Secondary | ICD-10-CM | POA: Diagnosis not present

## 2020-01-13 DIAGNOSIS — C787 Secondary malignant neoplasm of liver and intrahepatic bile duct: Secondary | ICD-10-CM

## 2020-01-13 DIAGNOSIS — K5900 Constipation, unspecified: Secondary | ICD-10-CM | POA: Diagnosis not present

## 2020-01-13 DIAGNOSIS — Z5111 Encounter for antineoplastic chemotherapy: Secondary | ICD-10-CM | POA: Diagnosis not present

## 2020-01-13 DIAGNOSIS — C250 Malignant neoplasm of head of pancreas: Secondary | ICD-10-CM | POA: Diagnosis not present

## 2020-01-13 DIAGNOSIS — E876 Hypokalemia: Secondary | ICD-10-CM

## 2020-01-13 DIAGNOSIS — R300 Dysuria: Secondary | ICD-10-CM | POA: Diagnosis not present

## 2020-01-13 DIAGNOSIS — D696 Thrombocytopenia, unspecified: Secondary | ICD-10-CM | POA: Diagnosis not present

## 2020-01-13 LAB — CBC WITH DIFFERENTIAL (CANCER CENTER ONLY)
Abs Immature Granulocytes: 0.03 10*3/uL (ref 0.00–0.07)
Basophils Absolute: 0 10*3/uL (ref 0.0–0.1)
Basophils Relative: 1 %
Eosinophils Absolute: 0 10*3/uL (ref 0.0–0.5)
Eosinophils Relative: 1 %
HCT: 28.1 % — ABNORMAL LOW (ref 39.0–52.0)
Hemoglobin: 9.2 g/dL — ABNORMAL LOW (ref 13.0–17.0)
Immature Granulocytes: 1 %
Lymphocytes Relative: 34 %
Lymphs Abs: 1.1 10*3/uL (ref 0.7–4.0)
MCH: 35.2 pg — ABNORMAL HIGH (ref 26.0–34.0)
MCHC: 32.7 g/dL (ref 30.0–36.0)
MCV: 107.7 fL — ABNORMAL HIGH (ref 80.0–100.0)
Monocytes Absolute: 0.4 10*3/uL (ref 0.1–1.0)
Monocytes Relative: 13 %
Neutro Abs: 1.7 10*3/uL (ref 1.7–7.7)
Neutrophils Relative %: 50 %
Platelet Count: 157 10*3/uL (ref 150–400)
RBC: 2.61 MIL/uL — ABNORMAL LOW (ref 4.22–5.81)
RDW: 14.6 % (ref 11.5–15.5)
WBC Count: 3.3 10*3/uL — ABNORMAL LOW (ref 4.0–10.5)
nRBC: 0 % (ref 0.0–0.2)

## 2020-01-13 LAB — CMP (CANCER CENTER ONLY)
ALT: 34 U/L (ref 0–44)
AST: 37 U/L (ref 15–41)
Albumin: 3.6 g/dL (ref 3.5–5.0)
Alkaline Phosphatase: 208 U/L — ABNORMAL HIGH (ref 38–126)
Anion gap: 7 (ref 5–15)
BUN: <5 mg/dL — ABNORMAL LOW (ref 8–23)
CO2: 25 mmol/L (ref 22–32)
Calcium: 9.1 mg/dL (ref 8.9–10.3)
Chloride: 101 mmol/L (ref 98–111)
Creatinine: 0.64 mg/dL (ref 0.61–1.24)
GFR, Est AFR Am: >60 mL/min (ref >60)
GFR, Estimated: >60 mL/min (ref >60)
Glucose, Bld: 182 mg/dL — ABNORMAL HIGH (ref 70–99)
Potassium: 4.1 mmol/L (ref 3.5–5.1)
Sodium: 133 mmol/L — ABNORMAL LOW (ref 135–145)
Total Bilirubin: 0.3 mg/dL (ref 0.3–1.2)
Total Protein: 6.9 g/dL (ref 6.5–8.1)

## 2020-01-13 MED ORDER — LEUCOVORIN CALCIUM INJECTION 350 MG
840.0000 mg | Freq: Once | INTRAVENOUS | Status: AC
  Administered 2020-01-13: 840 mg via INTRAVENOUS
  Filled 2020-01-13: qty 42

## 2020-01-13 MED ORDER — SODIUM CHLORIDE 0.9 % IV SOLN
5000.0000 mg | INTRAVENOUS | Status: DC
  Administered 2020-01-13: 5000 mg via INTRAVENOUS
  Filled 2020-01-13: qty 100

## 2020-01-13 MED ORDER — OXALIPLATIN CHEMO INJECTION 100 MG/20ML
175.0000 mg | Freq: Once | INTRAVENOUS | Status: AC
  Administered 2020-01-13: 175 mg via INTRAVENOUS
  Filled 2020-01-13: qty 35

## 2020-01-13 MED ORDER — DEXTROSE 5 % IV SOLN
Freq: Once | INTRAVENOUS | Status: AC
  Filled 2020-01-13: qty 250

## 2020-01-13 MED ORDER — PALONOSETRON HCL INJECTION 0.25 MG/5ML
0.2500 mg | Freq: Once | INTRAVENOUS | Status: AC
  Administered 2020-01-13: 0.25 mg via INTRAVENOUS

## 2020-01-13 MED ORDER — SODIUM CHLORIDE 0.9 % IV SOLN
10.0000 mg | Freq: Once | INTRAVENOUS | Status: AC
  Administered 2020-01-13: 10 mg via INTRAVENOUS
  Filled 2020-01-13: qty 10

## 2020-01-13 MED ORDER — SODIUM CHLORIDE 0.9 % IV SOLN
150.0000 mg | Freq: Once | INTRAVENOUS | Status: AC
  Administered 2020-01-13: 150 mg via INTRAVENOUS
  Filled 2020-01-13 (×2): qty 5

## 2020-01-13 MED ORDER — IRINOTECAN HCL CHEMO INJECTION 100 MG/5ML
380.0000 mg | Freq: Once | INTRAVENOUS | Status: AC
  Administered 2020-01-13: 380 mg via INTRAVENOUS
  Filled 2020-01-13: qty 15

## 2020-01-13 MED ORDER — ATROPINE SULFATE 1 MG/ML IJ SOLN
0.5000 mg | Freq: Once | INTRAMUSCULAR | Status: AC | PRN
  Administered 2020-01-13: 0.5 mg via INTRAVENOUS

## 2020-01-13 MED ORDER — ATROPINE SULFATE 1 MG/ML IJ SOLN
INTRAMUSCULAR | Status: AC
  Filled 2020-01-13: qty 1

## 2020-01-13 MED ORDER — PALONOSETRON HCL INJECTION 0.25 MG/5ML
INTRAVENOUS | Status: AC
  Filled 2020-01-13: qty 5

## 2020-01-13 MED ORDER — FLUOROURACIL CHEMO INJECTION 2.5 GM/50ML
850.0000 mg | Freq: Once | INTRAVENOUS | Status: AC
  Administered 2020-01-13: 850 mg via INTRAVENOUS
  Filled 2020-01-13: qty 17

## 2020-01-13 NOTE — Patient Instructions (Signed)
[facility] Discharge Instructions for Patients Receiving Chemotherapy  Today you received the following chemotherapy agents Irinotecan, Oxaliplatin, Leucovorin, 5FU.  To help prevent nausea and vomiting after your treatment, we encourage you to take your nausea medication as indicated by your MD.   If you develop nausea and vomiting that is not controlled by your nausea medication, call the clinic.   BELOW ARE SYMPTOMS THAT SHOULD BE REPORTED IMMEDIATELY:  *FEVER GREATER THAN 100.5 F  *CHILLS WITH OR WITHOUT FEVER  NAUSEA AND VOMITING THAT IS NOT CONTROLLED WITH YOUR NAUSEA MEDICATION  *UNUSUAL SHORTNESS OF BREATH  *UNUSUAL BRUISING OR BLEEDING  TENDERNESS IN MOUTH AND THROAT WITH OR WITHOUT PRESENCE OF ULCERS  *URINARY PROBLEMS  *BOWEL PROBLEMS  UNUSUAL RASH Items with * indicate a potential emergency and should be followed up as soon as possible.  Feel free to call the clinic should you have any questions or concerns. The clinic phone number is [phone].  Please show the [facility] at check-in to the Emergency Department and triage nurse.

## 2020-01-13 NOTE — Progress Notes (Signed)
Adjust chemo doses based on weight loss per MD.  Hardie Pulley, PharmD, BCPS, BCop

## 2020-01-13 NOTE — Progress Notes (Signed)
OK to treat today with CBC and CMET results from today per Dr. Marin Olp.

## 2020-01-14 ENCOUNTER — Encounter: Payer: Self-pay | Admitting: Family

## 2020-01-15 ENCOUNTER — Inpatient Hospital Stay: Payer: BC Managed Care – PPO

## 2020-01-15 ENCOUNTER — Other Ambulatory Visit: Payer: Self-pay | Admitting: Hematology & Oncology

## 2020-01-15 ENCOUNTER — Other Ambulatory Visit: Payer: Self-pay

## 2020-01-15 VITALS — BP 97/68 | HR 95 | Temp 97.1°F | Resp 18

## 2020-01-15 DIAGNOSIS — E876 Hypokalemia: Secondary | ICD-10-CM | POA: Diagnosis not present

## 2020-01-15 DIAGNOSIS — Z5111 Encounter for antineoplastic chemotherapy: Secondary | ICD-10-CM | POA: Diagnosis not present

## 2020-01-15 DIAGNOSIS — C250 Malignant neoplasm of head of pancreas: Secondary | ICD-10-CM | POA: Diagnosis not present

## 2020-01-15 DIAGNOSIS — R300 Dysuria: Secondary | ICD-10-CM | POA: Diagnosis not present

## 2020-01-15 DIAGNOSIS — K5900 Constipation, unspecified: Secondary | ICD-10-CM | POA: Diagnosis not present

## 2020-01-15 DIAGNOSIS — D696 Thrombocytopenia, unspecified: Secondary | ICD-10-CM | POA: Diagnosis not present

## 2020-01-15 DIAGNOSIS — C787 Secondary malignant neoplasm of liver and intrahepatic bile duct: Secondary | ICD-10-CM

## 2020-01-15 DIAGNOSIS — K921 Melena: Secondary | ICD-10-CM | POA: Diagnosis not present

## 2020-01-15 DIAGNOSIS — R978 Other abnormal tumor markers: Secondary | ICD-10-CM | POA: Diagnosis not present

## 2020-01-15 MED ORDER — PEGFILGRASTIM-JMDB 6 MG/0.6ML ~~LOC~~ SOSY
6.0000 mg | PREFILLED_SYRINGE | Freq: Once | SUBCUTANEOUS | Status: AC
  Administered 2020-01-15: 6 mg via SUBCUTANEOUS

## 2020-01-15 MED ORDER — HEPARIN SOD (PORK) LOCK FLUSH 100 UNIT/ML IV SOLN
500.0000 [IU] | Freq: Once | INTRAVENOUS | Status: AC | PRN
  Administered 2020-01-15: 500 [IU]
  Filled 2020-01-15: qty 5

## 2020-01-15 MED ORDER — PEGFILGRASTIM-JMDB 6 MG/0.6ML ~~LOC~~ SOSY
PREFILLED_SYRINGE | SUBCUTANEOUS | Status: AC
  Filled 2020-01-15: qty 0.6

## 2020-01-15 MED ORDER — SODIUM CHLORIDE 0.9% FLUSH
10.0000 mL | INTRAVENOUS | Status: DC | PRN
  Administered 2020-01-15: 10 mL
  Filled 2020-01-15: qty 10

## 2020-01-15 NOTE — Patient Instructions (Signed)

## 2020-01-18 DIAGNOSIS — C259 Malignant neoplasm of pancreas, unspecified: Secondary | ICD-10-CM | POA: Diagnosis not present

## 2020-01-27 ENCOUNTER — Telehealth: Payer: Self-pay | Admitting: Family

## 2020-01-27 ENCOUNTER — Inpatient Hospital Stay: Payer: BC Managed Care – PPO

## 2020-01-27 ENCOUNTER — Inpatient Hospital Stay (HOSPITAL_BASED_OUTPATIENT_CLINIC_OR_DEPARTMENT_OTHER): Payer: BC Managed Care – PPO | Admitting: Family

## 2020-01-27 ENCOUNTER — Other Ambulatory Visit: Payer: Self-pay

## 2020-01-27 ENCOUNTER — Encounter: Payer: Self-pay | Admitting: Family

## 2020-01-27 VITALS — BP 102/74 | HR 88 | Temp 98.0°F | Resp 18 | Wt 194.1 lb

## 2020-01-27 DIAGNOSIS — E876 Hypokalemia: Secondary | ICD-10-CM | POA: Diagnosis not present

## 2020-01-27 DIAGNOSIS — R300 Dysuria: Secondary | ICD-10-CM | POA: Diagnosis not present

## 2020-01-27 DIAGNOSIS — R399 Unspecified symptoms and signs involving the genitourinary system: Secondary | ICD-10-CM

## 2020-01-27 DIAGNOSIS — K5900 Constipation, unspecified: Secondary | ICD-10-CM | POA: Diagnosis not present

## 2020-01-27 DIAGNOSIS — N3000 Acute cystitis without hematuria: Secondary | ICD-10-CM | POA: Diagnosis not present

## 2020-01-27 DIAGNOSIS — K921 Melena: Secondary | ICD-10-CM | POA: Diagnosis not present

## 2020-01-27 DIAGNOSIS — C259 Malignant neoplasm of pancreas, unspecified: Secondary | ICD-10-CM

## 2020-01-27 DIAGNOSIS — C250 Malignant neoplasm of head of pancreas: Secondary | ICD-10-CM | POA: Diagnosis not present

## 2020-01-27 DIAGNOSIS — C787 Secondary malignant neoplasm of liver and intrahepatic bile duct: Secondary | ICD-10-CM | POA: Diagnosis not present

## 2020-01-27 DIAGNOSIS — R978 Other abnormal tumor markers: Secondary | ICD-10-CM | POA: Diagnosis not present

## 2020-01-27 DIAGNOSIS — D696 Thrombocytopenia, unspecified: Secondary | ICD-10-CM | POA: Diagnosis not present

## 2020-01-27 DIAGNOSIS — Z5111 Encounter for antineoplastic chemotherapy: Secondary | ICD-10-CM | POA: Diagnosis not present

## 2020-01-27 LAB — CBC WITH DIFFERENTIAL (CANCER CENTER ONLY)
Abs Immature Granulocytes: 0.03 10*3/uL (ref 0.00–0.07)
Basophils Absolute: 0 10*3/uL (ref 0.0–0.1)
Basophils Relative: 0 %
Eosinophils Absolute: 0 10*3/uL (ref 0.0–0.5)
Eosinophils Relative: 0 %
HCT: 25.3 % — ABNORMAL LOW (ref 39.0–52.0)
Hemoglobin: 8.4 g/dL — ABNORMAL LOW (ref 13.0–17.0)
Immature Granulocytes: 1 %
Lymphocytes Relative: 34 %
Lymphs Abs: 1 10*3/uL (ref 0.7–4.0)
MCH: 35.7 pg — ABNORMAL HIGH (ref 26.0–34.0)
MCHC: 33.2 g/dL (ref 30.0–36.0)
MCV: 107.7 fL — ABNORMAL HIGH (ref 80.0–100.0)
Monocytes Absolute: 0.3 10*3/uL (ref 0.1–1.0)
Monocytes Relative: 11 %
Neutro Abs: 1.6 10*3/uL — ABNORMAL LOW (ref 1.7–7.7)
Neutrophils Relative %: 54 %
Platelet Count: 64 10*3/uL — ABNORMAL LOW (ref 150–400)
RBC: 2.35 MIL/uL — ABNORMAL LOW (ref 4.22–5.81)
RDW: 15.8 % — ABNORMAL HIGH (ref 11.5–15.5)
WBC Count: 3 10*3/uL — ABNORMAL LOW (ref 4.0–10.5)
nRBC: 0 % (ref 0.0–0.2)

## 2020-01-27 LAB — CMP (CANCER CENTER ONLY)
ALT: 56 U/L — ABNORMAL HIGH (ref 0–44)
AST: 36 U/L (ref 15–41)
Albumin: 3.4 g/dL — ABNORMAL LOW (ref 3.5–5.0)
Alkaline Phosphatase: 166 U/L — ABNORMAL HIGH (ref 38–126)
Anion gap: 8 (ref 5–15)
BUN: 5 mg/dL — ABNORMAL LOW (ref 8–23)
CO2: 26 mmol/L (ref 22–32)
Calcium: 8.5 mg/dL — ABNORMAL LOW (ref 8.9–10.3)
Chloride: 104 mmol/L (ref 98–111)
Creatinine: 0.53 mg/dL — ABNORMAL LOW (ref 0.61–1.24)
GFR, Est AFR Am: >60 mL/min (ref >60)
GFR, Estimated: >60 mL/min (ref >60)
Glucose, Bld: 132 mg/dL — ABNORMAL HIGH (ref 70–99)
Potassium: 3.2 mmol/L — ABNORMAL LOW (ref 3.5–5.1)
Sodium: 138 mmol/L (ref 135–145)
Total Bilirubin: 0.3 mg/dL (ref 0.3–1.2)
Total Protein: 6.4 g/dL — ABNORMAL LOW (ref 6.5–8.1)

## 2020-01-27 LAB — URINALYSIS, COMPLETE (UACMP) WITH MICROSCOPIC
Bilirubin Urine: NEGATIVE
Glucose, UA: NEGATIVE mg/dL
Ketones, ur: NEGATIVE mg/dL
Nitrite: NEGATIVE
Protein, ur: 100 mg/dL — AB
Specific Gravity, Urine: 1.02 (ref 1.005–1.030)
WBC, UA: >50 WBC/hpf (ref 0–5)
pH: 6 (ref 5.0–8.0)

## 2020-01-27 MED ORDER — SODIUM CHLORIDE 0.9% FLUSH
10.0000 mL | Freq: Once | INTRAVENOUS | Status: AC
  Administered 2020-01-27: 10 mL via INTRAVENOUS
  Filled 2020-01-27: qty 10

## 2020-01-27 MED ORDER — HEPARIN SOD (PORK) LOCK FLUSH 100 UNIT/ML IV SOLN
500.0000 [IU] | Freq: Once | INTRAVENOUS | Status: AC
  Administered 2020-01-27: 500 [IU] via INTRAVENOUS
  Filled 2020-01-27: qty 5

## 2020-01-27 NOTE — Addendum Note (Signed)
Addended by: Shelda Altes on: 01/27/2020 10:28 AM   Modules accepted: Orders

## 2020-01-27 NOTE — Telephone Encounter (Signed)
Called and spoke with patient regarding appointments added.. he was in agreement with both date/time per 5/26 los

## 2020-01-27 NOTE — Progress Notes (Signed)
Hematology and Oncology Follow Up Visit  Jeffrey Harris [id number] [birth year] 64 y.o. 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 He is doing fairly well but his platelet count and Hgb are down again. I spoke with Dr. Marin Harris and he seems to do better going 3 weeks between treatment. His counts have time to recover by that point.  CA 19-9 is improving and last level was down to 956.  No fever, chills, n/v, cough, rash, dizziness, SOB, chest pain, palpitations, abdominal pain or changes in bowel or bladder habits.  No swelling, tenderness, numbness or tingling in his extremities at this time. He does have sensitivity to temperature for several days after treatment.  No falls or syncopal episodes to report.  He has a good appetite and is staying well hydrated. His weight is stable.   ECOG Performance Status: 1 - Symptomatic but completely ambulatory   Medications:  Allergies as of 01/27/2020   No Known Allergies     Medication List       Accurate as of Jan 27, 2020  9:34 AM. If you have any questions, ask your nurse or doctor.        dexamethasone 4 MG tablet Commonly known as: DECADRON TAKE 2 TABLETS BY MOUTH DAILY. START THE DAY AFTER CHEMOTHERAPY FOR 3 DAYS. TAKE WITH FOOD.   diphenoxylate-atropine 2.5-0.025 MG tablet Commonly known as: LOMOTIL Take 1-2 tablets by mouth 4 (four) times daily as needed for diarrhea or loose stools.   hydrocortisone 25 MG suppository Commonly known as: ANUSOL-HC PLACE 1 SUPPOSITORY (25 MG TOTAL) RECTALLY 2 (TWO) TIMES DAILY. FOR 7 DAYS   Klor-Con M20 20 MEQ tablet Generic drug: potassium chloride SA TAKE 2 TABLETS BY MOUTH DAILY   lactulose 10 GM/15ML solution Commonly known as: CHRONULAC SMARTSIG:40 Milliliter(s) By Mouth Morning-Evening    linaclotide 145 MCG Caps capsule Commonly known as: Linzess Take 1 capsule (145 mcg total) by mouth daily before breakfast.   lipase/protease/amylase 36000 UNITS Cpep capsule Commonly known as: Creon Take 2 capsules (72,000 Units total) by mouth 3 (three) times daily before meals.   loperamide 2 MG tablet Commonly known as: Imodium A-D Take 2 at onset of diarrhea, then 1 every 2hrs until 12hr without a BM. May take 2 tab every 4hrs at bedtime. If diarrhea recurs repeat.   LORazepam 0.5 MG tablet Commonly known as: Ativan Take 1 tablet (0.5 mg total) by mouth every 6 (six) hours as needed for anxiety.   ondansetron 8 MG tablet Commonly known as: Zofran Take 1 tablet (8 mg total) by mouth 2 (two) times daily as needed. Start on day 3 after chemotherapy.   oxyCODONE-acetaminophen 5-325 MG tablet Commonly known as: PERCOCET/ROXICET Take 1 tablet by mouth every 6 (six) hours as needed for severe pain.   pantoprazole 40 MG tablet Commonly known as: PROTONIX TAKE 1 TABLET BY MOUTH TWICE A DAY   prochlorperazine 10 MG tablet Commonly known as: COMPAZINE Take 1 tablet (10 mg total) by mouth every 6 (six) hours as needed (Nausea or vomiting).   sulfamethoxazole-trimethoprim 800-160 MG tablet Commonly known as: BACTRIM DS Take 1 tablet by mouth 2 (two) times daily.   zolpidem 5 MG tablet Commonly known as: AMBIEN Take 1 tablet (5 mg total) by mouth at bedtime as needed for sleep.  Allergies: No Known Allergies  Past Medical History, Surgical history, Social history, and Family History were reviewed and updated.  Review of Systems: All other 10 point review of systems is negative.   Physical Exam:  vitals were not taken for this visit.   Wt Readings from Last 3 Encounters:  01/13/20 192 lb (87.1 kg)  01/05/20 192 lb 0.6 oz (87.1 kg)  12/22/19 196 lb 12 oz (89.2 kg)    Ocular: Sclerae unicteric, pupils equal, round and reactive to light Ear-nose-throat:  Oropharynx clear, dentition fair Lymphatic: No cervical or supraclavicular adenopathy Lungs no rales or rhonchi, good excursion bilaterally Heart regular rate and rhythm, no murmur appreciated Abd soft, nontender, positive bowel sounds, no liver or spleen tip palpated on exam, no fluid wave  MSK no focal spinal tenderness, no joint edema Neuro: non-focal, well-oriented, appropriate affect Breasts: Deferred   Lab Results  Component Value Date   WBC 3.0 (L) 01/27/2020   HGB 8.4 (L) 01/27/2020   HCT 25.3 (L) 01/27/2020   MCV 107.7 (H) 01/27/2020   PLT 64 (L) 01/27/2020   Lab Results  Component Value Date   FERRITIN 1,046 (H) 12/02/2019   IRON 97 12/02/2019   TIBC 244 12/02/2019   UIBC 146 12/02/2019   IRONPCTSAT 40 12/02/2019   Lab Results  Component Value Date   RBC 2.35 (L) 01/27/2020   No results found for: KPAFRELGTCHN, LAMBDASER, KAPLAMBRATIO No results found for: IGGSERUM, IGA, IGMSERUM No results found for: Ronnald Ramp, A1GS, A2GS, BETS, BETA2SER, GAMS, MSPIKE, SPEI   Chemistry      Component Value Date/Time   NA 133 (L) 01/13/2020 0836   K 4.1 01/13/2020 0836   CL 101 01/13/2020 0836   CO2 25 01/13/2020 0836   BUN <5 (L) 01/13/2020 0836   CREATININE 0.64 01/13/2020 0836      Component Value Date/Time   CALCIUM 9.1 01/13/2020 0836   ALKPHOS 208 (H) 01/13/2020 0836   AST 37 01/13/2020 0836   ALT 34 01/13/2020 0836   BILITOT 0.3 01/13/2020 0836       Impression and Plan: Jeffrey Harris is a very pleasant 64 yo caucasian gentleman with metastatic pancreatic cancer with a markedly elevated CA 19-9.  We will hold treatment today and resume next week. We will now change his treatment regimen to every 3 weeks.  He is in agreement with the plan.  We will plan to repeat CT scans in another 3 cycles.  He promises to contact our office with any questions or concerns. We can certainly see him sooner if needed.   Jeffrey Peace, NP 5/26/20219:34 AM

## 2020-01-27 NOTE — Patient Instructions (Signed)
Implanted Port Insertion, Care After °This sheet gives you information about how to care for yourself after your procedure. Your health care provider may also give you more specific instructions. If you have problems or questions, contact your health care provider. °What can I expect after the procedure? °After the procedure, it is common to have: °· Discomfort at the port insertion site. °· Bruising on the skin over the port. This should improve over 3-4 days. °Follow these instructions at home: °Port care °· After your port is placed, you will get a manufacturer's information card. The card has information about your port. Keep this card with you at all times. °· Take care of the port as told by your health care provider. Ask your health care provider if you or a family member can get training for taking care of the port at home. A home health care nurse may also take care of the port. °· Make sure to remember what type of port you have. °Incision care ° °  ° °· Follow instructions from your health care provider about how to take care of your port insertion site. Make sure you: °? Wash your hands with soap and water before and after you change your bandage (dressing). If soap and water are not available, use hand sanitizer. °? Change your dressing as told by your health care provider. °? Leave stitches (sutures), skin glue, or adhesive strips in place. These skin closures may need to stay in place for 2 weeks or longer. If adhesive strip edges start to loosen and curl up, you may trim the loose edges. Do not remove adhesive strips completely unless your health care provider tells you to do that. °· Check your port insertion site every day for signs of infection. Check for: °? Redness, swelling, or pain. °? Fluid or blood. °? Warmth. °? Pus or a bad smell. °Activity °· Return to your normal activities as told by your health care provider. Ask your health care provider what activities are safe for you. °· Do not  lift anything that is heavier than 10 lb (4.5 kg), or the limit that you are told, until your health care provider says that it is safe. °General instructions °· Take over-the-counter and prescription medicines only as told by your health care provider. °· Do not take baths, swim, or use a hot tub until your health care provider approves. Ask your health care provider if you may take showers. You may only be allowed to take sponge baths. °· Do not drive for 24 hours if you were given a sedative during your procedure. °· Wear a medical alert bracelet in case of an emergency. This will tell any health care providers that you have a port. °· Keep all follow-up visits as told by your health care provider. This is important. °Contact a health care provider if: °· You cannot flush your port with saline as directed, or you cannot draw blood from the port. °· You have a fever or chills. °· You have redness, swelling, or pain around your port insertion site. °· You have fluid or blood coming from your port insertion site. °· Your port insertion site feels warm to the touch. °· You have pus or a bad smell coming from the port insertion site. °Get help right away if: °· You have chest pain or shortness of breath. °· You have bleeding from your port that you cannot control. °Summary °· Take care of the port as told by your health   care provider. Keep the manufacturer's information card with you at all times. °· Change your dressing as told by your health care provider. °· Contact a health care provider if you have a fever or chills or if you have redness, swelling, or pain around your port insertion site. °· Keep all follow-up visits as told by your health care provider. °This information is not intended to replace advice given to you by your health care provider. Make sure you discuss any questions you have with your health care provider. °Document Revised: 03/18/2018 Document Reviewed: 03/18/2018 °Elsevier Patient Education ©  2020 [employer] ° °

## 2020-01-28 ENCOUNTER — Other Ambulatory Visit: Payer: Self-pay

## 2020-01-28 ENCOUNTER — Other Ambulatory Visit: Payer: Self-pay | Admitting: Family

## 2020-01-28 DIAGNOSIS — E876 Hypokalemia: Secondary | ICD-10-CM

## 2020-01-28 DIAGNOSIS — C787 Secondary malignant neoplasm of liver and intrahepatic bile duct: Secondary | ICD-10-CM

## 2020-01-28 DIAGNOSIS — C259 Malignant neoplasm of pancreas, unspecified: Secondary | ICD-10-CM

## 2020-01-28 LAB — CANCER ANTIGEN 19-9: CA 19-9: 1445 U/mL — ABNORMAL HIGH (ref 0–35)

## 2020-01-28 MED ORDER — POTASSIUM CHLORIDE CRYS ER 20 MEQ PO TBCR: 40.0000 meq | EXTENDED_RELEASE_TABLET | Freq: Every day | ORAL | 3 refills | Status: DC

## 2020-01-29 ENCOUNTER — Other Ambulatory Visit: Payer: Self-pay | Admitting: Family

## 2020-01-29 ENCOUNTER — Telehealth: Payer: Self-pay | Admitting: *Deleted

## 2020-01-29 ENCOUNTER — Inpatient Hospital Stay: Payer: BC Managed Care – PPO

## 2020-01-29 DIAGNOSIS — N3000 Acute cystitis without hematuria: Secondary | ICD-10-CM

## 2020-01-29 LAB — URINE CULTURE: Culture: 20000 — AB

## 2020-01-29 MED ORDER — SULFAMETHOXAZOLE-TRIMETHOPRIM 800-160 MG PO TABS: 1.0000 | ORAL_TABLET | Freq: Two times a day (BID) | ORAL | 0 refills | Status: DC

## 2020-01-29 NOTE — Telephone Encounter (Signed)
-----   Message from Jeffrey Bottom, NP sent at 01/29/2020  8:42 AM EDT ----- Sent prescription in for Bactrim for UTI!   ----- Message ----- From: Interface, Lab In [facility] Sent: 01/27/2020   9:33 AM EDT To: Jeffrey Bottom, NP

## 2020-01-29 NOTE — Telephone Encounter (Signed)
As noted below by Laverna Peace, NP, I informed the patient that she called in a prescription for a UTI. He verbalized understanding.

## 2020-02-03 ENCOUNTER — Inpatient Hospital Stay: Payer: BC Managed Care – PPO

## 2020-02-03 ENCOUNTER — Inpatient Hospital Stay: Payer: BC Managed Care – PPO | Attending: Hematology & Oncology

## 2020-02-03 ENCOUNTER — Encounter: Payer: Self-pay | Admitting: Hematology & Oncology

## 2020-02-03 ENCOUNTER — Other Ambulatory Visit: Payer: Self-pay

## 2020-02-03 ENCOUNTER — Inpatient Hospital Stay (HOSPITAL_BASED_OUTPATIENT_CLINIC_OR_DEPARTMENT_OTHER): Payer: BC Managed Care – PPO | Admitting: Hematology & Oncology

## 2020-02-03 VITALS — BP 105/64 | HR 88 | Temp 98.1°F | Resp 17 | Wt 193.0 lb

## 2020-02-03 DIAGNOSIS — R978 Other abnormal tumor markers: Secondary | ICD-10-CM | POA: Diagnosis not present

## 2020-02-03 DIAGNOSIS — C250 Malignant neoplasm of head of pancreas: Secondary | ICD-10-CM | POA: Diagnosis not present

## 2020-02-03 DIAGNOSIS — R1011 Right upper quadrant pain: Secondary | ICD-10-CM | POA: Diagnosis not present

## 2020-02-03 DIAGNOSIS — C787 Secondary malignant neoplasm of liver and intrahepatic bile duct: Secondary | ICD-10-CM | POA: Diagnosis not present

## 2020-02-03 DIAGNOSIS — D696 Thrombocytopenia, unspecified: Secondary | ICD-10-CM | POA: Insufficient documentation

## 2020-02-03 DIAGNOSIS — N3000 Acute cystitis without hematuria: Secondary | ICD-10-CM

## 2020-02-03 DIAGNOSIS — Z5111 Encounter for antineoplastic chemotherapy: Secondary | ICD-10-CM | POA: Insufficient documentation

## 2020-02-03 DIAGNOSIS — C259 Malignant neoplasm of pancreas, unspecified: Secondary | ICD-10-CM

## 2020-02-03 LAB — CBC WITH DIFFERENTIAL (CANCER CENTER ONLY)
Abs Immature Granulocytes: 0.01 10*3/uL (ref 0.00–0.07)
Basophils Absolute: 0 10*3/uL (ref 0.0–0.1)
Basophils Relative: 1 %
Eosinophils Absolute: 0 10*3/uL (ref 0.0–0.5)
Eosinophils Relative: 0 %
HCT: 27.9 % — ABNORMAL LOW (ref 39.0–52.0)
Hemoglobin: 9.3 g/dL — ABNORMAL LOW (ref 13.0–17.0)
Immature Granulocytes: 0 %
Lymphocytes Relative: 44 %
Lymphs Abs: 1.1 10*3/uL (ref 0.7–4.0)
MCH: 36.5 pg — ABNORMAL HIGH (ref 26.0–34.0)
MCHC: 33.3 g/dL (ref 30.0–36.0)
MCV: 109.4 fL — ABNORMAL HIGH (ref 80.0–100.0)
Monocytes Absolute: 0.3 10*3/uL (ref 0.1–1.0)
Monocytes Relative: 13 %
Neutro Abs: 1.1 10*3/uL — ABNORMAL LOW (ref 1.7–7.7)
Neutrophils Relative %: 42 %
Platelet Count: 108 10*3/uL — ABNORMAL LOW (ref 150–400)
RBC: 2.55 MIL/uL — ABNORMAL LOW (ref 4.22–5.81)
RDW: 16.1 % — ABNORMAL HIGH (ref 11.5–15.5)
WBC Count: 2.6 10*3/uL — ABNORMAL LOW (ref 4.0–10.5)
nRBC: 0 % (ref 0.0–0.2)

## 2020-02-03 LAB — CMP (CANCER CENTER ONLY)
ALT: 43 U/L (ref 0–44)
AST: 45 U/L — ABNORMAL HIGH (ref 15–41)
Albumin: 3.6 g/dL (ref 3.5–5.0)
Alkaline Phosphatase: 229 U/L — ABNORMAL HIGH (ref 38–126)
Anion gap: 7 (ref 5–15)
BUN: 6 mg/dL — ABNORMAL LOW (ref 8–23)
CO2: 23 mmol/L (ref 22–32)
Calcium: 9.2 mg/dL (ref 8.9–10.3)
Chloride: 103 mmol/L (ref 98–111)
Creatinine: 0.59 mg/dL — ABNORMAL LOW (ref 0.61–1.24)
GFR, Est AFR Am: >60 mL/min (ref >60)
GFR, Estimated: >60 mL/min (ref >60)
Glucose, Bld: 108 mg/dL — ABNORMAL HIGH (ref 70–99)
Potassium: 4.1 mmol/L (ref 3.5–5.1)
Sodium: 133 mmol/L — ABNORMAL LOW (ref 135–145)
Total Bilirubin: 0.3 mg/dL (ref 0.3–1.2)
Total Protein: 6.9 g/dL (ref 6.5–8.1)

## 2020-02-03 MED ORDER — ATROPINE SULFATE 1 MG/ML IJ SOLN
INTRAMUSCULAR | Status: AC
  Filled 2020-02-03: qty 1

## 2020-02-03 MED ORDER — PALONOSETRON HCL INJECTION 0.25 MG/5ML
0.2500 mg | Freq: Once | INTRAVENOUS | Status: AC
  Administered 2020-02-03: 0.25 mg via INTRAVENOUS

## 2020-02-03 MED ORDER — SODIUM CHLORIDE 0.9 % IV SOLN
INTRAVENOUS | Status: DC
  Filled 2020-02-03: qty 250

## 2020-02-03 MED ORDER — SODIUM CHLORIDE 0.9 % IV SOLN
150.0000 mg | Freq: Once | INTRAVENOUS | Status: AC
  Administered 2020-02-03: 150 mg via INTRAVENOUS
  Filled 2020-02-03: qty 150

## 2020-02-03 MED ORDER — SODIUM CHLORIDE 0.9 % IV SOLN
2400.0000 mg/m2 | INTRAVENOUS | Status: DC
  Administered 2020-02-03: 5000 mg via INTRAVENOUS
  Filled 2020-02-03: qty 100

## 2020-02-03 MED ORDER — LEUCOVORIN CALCIUM INJECTION 350 MG
400.0000 mg/m2 | Freq: Once | INTRAVENOUS | Status: AC
  Administered 2020-02-03: 832 mg via INTRAVENOUS
  Filled 2020-02-03: qty 41.6

## 2020-02-03 MED ORDER — IRINOTECAN HCL CHEMO INJECTION 100 MG/5ML
144.0000 mg/m2 | Freq: Once | INTRAVENOUS | Status: AC
  Administered 2020-02-03: 300 mg via INTRAVENOUS
  Filled 2020-02-03: qty 15

## 2020-02-03 MED ORDER — DEXTROSE 5 % IV SOLN
Freq: Once | INTRAVENOUS | Status: AC
  Filled 2020-02-03: qty 250

## 2020-02-03 MED ORDER — DEXTROSE 5 % IV SOLN
Freq: Once | INTRAVENOUS | Status: DC
  Filled 2020-02-03: qty 250

## 2020-02-03 MED ORDER — PALONOSETRON HCL INJECTION 0.25 MG/5ML
INTRAVENOUS | Status: AC
  Filled 2020-02-03: qty 5

## 2020-02-03 MED ORDER — OXALIPLATIN CHEMO INJECTION 100 MG/20ML
175.0000 mg | Freq: Once | INTRAVENOUS | Status: AC
  Administered 2020-02-03: 175 mg via INTRAVENOUS
  Filled 2020-02-03: qty 35

## 2020-02-03 MED ORDER — SODIUM CHLORIDE 0.9 % IV SOLN
10.0000 mg | Freq: Once | INTRAVENOUS | Status: AC
  Administered 2020-02-03: 10 mg via INTRAVENOUS
  Filled 2020-02-03: qty 10

## 2020-02-03 MED ORDER — ATROPINE SULFATE 1 MG/ML IJ SOLN
0.5000 mg | Freq: Once | INTRAMUSCULAR | Status: AC | PRN
  Administered 2020-02-03: 0.5 mg via INTRAVENOUS

## 2020-02-03 NOTE — Patient Instructions (Signed)

## 2020-02-03 NOTE — Progress Notes (Signed)
Hematology and Oncology Follow Up Visit  Jeffrey Harris [id number] 11/04/1955 64 y.o. 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  We held his treatment last time because his blood counts.  We noted that when he was here last, the CA 19-9 was also higher.  The CA 19-9-week or so ago was 1445.  I am a little surprised by this.  His alkaline phosphatase is up a little bit.  This might be reflective of the diabetes.  He says he feels good.  He had a good Memorial Day weekend.  I think he did play a little golf.  He has had no problems with pain.  He has had no fever.  There has been no rashes.  He has had no leg swelling.  There has been no tingling in the hands or feet.  Overall, his performance status is ECOG 1.   Medications:  Allergies as of 02/03/2020   No Known Allergies     Medication List       Accurate as of February 03, 2020  9:01 AM. If you have any questions, ask your nurse or doctor.        dexamethasone 4 MG tablet Commonly known as: DECADRON TAKE 2 TABLETS BY MOUTH DAILY. START THE DAY AFTER CHEMOTHERAPY FOR 3 DAYS. TAKE WITH FOOD.   diphenoxylate-atropine 2.5-0.025 MG tablet Commonly known as: LOMOTIL Take 1-2 tablets by mouth 4 (four) times daily as needed for diarrhea or loose stools.   hydrocortisone 25 MG suppository Commonly known as: ANUSOL-HC PLACE 1 SUPPOSITORY (25 MG TOTAL) RECTALLY 2 (TWO) TIMES DAILY. FOR 7 DAYS   lactulose 10 GM/15ML solution Commonly known as: CHRONULAC SMARTSIG:40 Milliliter(s) By Mouth Morning-Evening   linaclotide 145 MCG Caps capsule Commonly known as: Linzess Take 1 capsule (145 mcg total) by mouth daily before breakfast.   lipase/protease/amylase 36000 UNITS Cpep capsule Commonly known as: Creon Take 2 capsules (72,000 Units  total) by mouth 3 (three) times daily before meals.   loperamide 2 MG tablet Commonly known as: Imodium A-D Take 2 at onset of diarrhea, then 1 every 2hrs until 12hr without a BM. May take 2 tab every 4hrs at bedtime. If diarrhea recurs repeat.   LORazepam 0.5 MG tablet Commonly known as: Ativan Take 1 tablet (0.5 mg total) by mouth every 6 (six) hours as needed for anxiety.   ondansetron 8 MG tablet Commonly known as: Zofran Take 1 tablet (8 mg total) by mouth 2 (two) times daily as needed. Start on day 3 after chemotherapy.   oxyCODONE-acetaminophen 5-325 MG tablet Commonly known as: PERCOCET/ROXICET Take 1 tablet by mouth every 6 (six) hours as needed for severe pain.   pantoprazole 40 MG tablet Commonly known as: PROTONIX TAKE 1 TABLET BY MOUTH TWICE A DAY   potassium chloride SA 20 MEQ tablet Commonly known as: Klor-Con M20 Take 2 tablets (40 mEq total) by mouth daily.   prochlorperazine 10 MG tablet Commonly known as: COMPAZINE Take 1 tablet (10 mg total) by mouth every 6 (six) hours as needed (Nausea or vomiting).   sulfamethoxazole-trimethoprim 800-160 MG tablet Commonly known as: BACTRIM DS Take 1 tablet by mouth 2 (two) times daily.   tamsulosin 0.4 MG Caps capsule Commonly known as: FLOMAX Take 0.4 mg by mouth daily.   zolpidem 5 MG tablet Commonly  known as: AMBIEN Take 1 tablet (5 mg total) by mouth at bedtime as needed for sleep.       Allergies: No Known Allergies  Past Medical History, Surgical history, Social history, and Family History were reviewed and updated.  Review of Systems: Review of Systems  Constitutional: Negative.   HENT: Negative.   Eyes: Negative.   Respiratory: Negative.   Cardiovascular: Negative.   Gastrointestinal: Negative.   Genitourinary: Negative.   Musculoskeletal: Negative.   Skin: Negative.   Neurological: Negative.   Endo/Heme/Allergies: Negative.   Psychiatric/Behavioral: Negative.      Physical Exam:   weight is 193 lb (87.5 kg). His oral temperature is 98.1 F (36.7 C). His blood pressure is 105/64 and his pulse is 88. His respiration is 17 and oxygen saturation is 100%.   Wt Readings from Last 3 Encounters:  02/03/20 193 lb (87.5 kg)  01/27/20 194 lb 1 oz (88 kg)  01/13/20 192 lb (87.1 kg)    Physical Exam Vitals reviewed.  HENT:     Head: Normocephalic and atraumatic.  Eyes:     Pupils: Pupils are equal, round, and reactive to light.  Cardiovascular:     Rate and Rhythm: Normal rate and regular rhythm.     Heart sounds: Normal heart sounds.  Pulmonary:     Effort: Pulmonary effort is normal.     Breath sounds: Normal breath sounds.  Abdominal:     General: Bowel sounds are normal.     Palpations: Abdomen is soft.  Musculoskeletal:        General: No tenderness or deformity. Normal range of motion.     Cervical back: Normal range of motion.  Lymphadenopathy:     Cervical: No cervical adenopathy.  Skin:    General: Skin is warm and dry.     Findings: No erythema or rash.  Neurological:     Mental Status: He is alert and oriented to person, place, and time.  Psychiatric:        Behavior: Behavior normal.        Thought Content: Thought content normal.        Judgment: Judgment normal.      Lab Results  Component Value Date   WBC 2.6 (L) 02/03/2020   HGB 9.3 (L) 02/03/2020   HCT 27.9 (L) 02/03/2020   MCV 109.4 (H) 02/03/2020   PLT 108 (L) 02/03/2020   Lab Results  Component Value Date   FERRITIN 1,046 (H) 12/02/2019   IRON 97 12/02/2019   TIBC 244 12/02/2019   UIBC 146 12/02/2019   IRONPCTSAT 40 12/02/2019   Lab Results  Component Value Date   RBC 2.55 (L) 02/03/2020   No results found for: KPAFRELGTCHN, LAMBDASER, KAPLAMBRATIO No results found for: IGGSERUM, IGA, IGMSERUM No results found for: TOTALPROTELP, ALBUMINELP, A1GS, A2GS, BETS, BETA2SER, GAMS, MSPIKE, SPEI   Chemistry      Component Value Date/Time   NA 133 (L) 02/03/2020 0815   K 4.1  02/03/2020 0815   CL 103 02/03/2020 0815   CO2 23 02/03/2020 0815   BUN 6 (L) 02/03/2020 0815   CREATININE 0.59 (L) 02/03/2020 0815      Component Value Date/Time   CALCIUM 9.2 02/03/2020 0815   ALKPHOS 229 (H) 02/03/2020 0815   AST 45 (H) 02/03/2020 0815   ALT 43 02/03/2020 0815   BILITOT 0.3 02/03/2020 0815       Impression and Plan:   Mr. Tippens is a nice 64 year old white male with metastatic  pancreatic cancer.  He has done very nicely so far.  The CA 19-9 of 9 had been coming down.  Again I am a little surprised that it was back up.  I think we can have to get some scans on him after this cycle of treatment.  He is relatively asymptomatic.  Hopefully, we will see that the tumor level will be going back down.  Of course, the CAT scan will really shows what is going on.  We will have to make an adjustment with his protocol because of the past thrombocytopenia.  I want to try to avoid this if possible.  I know we are treating him every 3 weeks which seems to work pretty well.  His blood counts do seem to really cover which is important.  His quality of life overall is not that bad.    Volanda Napoleon, MD 6/2/20219:01 AM

## 2020-02-03 NOTE — Progress Notes (Signed)
Ok to treat with ANC today per MD Ennever 

## 2020-02-03 NOTE — Patient Instructions (Signed)
Leucovorin injection What is this medicine? LEUCOVORIN (loo koe VOR in) is used to prevent or treat the harmful effects of some medicines. This medicine is used to treat anemia caused by a low amount of folic acid in the body. It is also used with 5-fluorouracil (5-FU) to treat colon cancer. This medicine may be used for other purposes; ask your health care provider or pharmacist if you have questions. What should I tell my health care provider before I take this medicine? They need to know if you have any of these conditions:  anemia from low levels of vitamin B-12 in the blood  an unusual or allergic reaction to leucovorin, folic acid, other medicines, foods, dyes, or preservatives  pregnant or trying to get pregnant  breast-feeding How should I use this medicine? This medicine is for injection into a muscle or into a vein. It is given by a health care professional in a hospital or clinic setting. Talk to your pediatrician regarding the use of this medicine in children. Special care may be needed. Overdosage: If you think you have taken too much of this medicine contact a poison control center or emergency room at once. NOTE: This medicine is only for you. Do not share this medicine with others. What if I miss a dose? This does not apply. What may interact with this medicine?  capecitabine  fluorouracil  phenobarbital  phenytoin  primidone  trimethoprim-sulfamethoxazole This list may not describe all possible interactions. Give your health care provider a list of all the medicines, herbs, non-prescription drugs, or dietary supplements you use. Also tell them if you smoke, drink alcohol, or use illegal drugs. Some items may interact with your medicine. What should I watch for while using this medicine? Your condition will be monitored carefully while you are receiving this medicine. This medicine may increase the side effects of 5-fluorouracil, 5-FU. Tell your doctor or health  care professional if you have diarrhea or mouth sores that do not get better or that get worse. What side effects may I notice from receiving this medicine? Side effects that you should report to your doctor or health care professional as soon as possible:  allergic reactions like skin rash, itching or hives, swelling of the face, lips, or tongue  breathing problems  fever, infection  mouth sores  unusual bleeding or bruising  unusually weak or tired Side effects that usually do not require medical attention (report to your doctor or health care professional if they continue or are bothersome):  constipation or diarrhea  loss of appetite  nausea, vomiting This list may not describe all possible side effects. Call your doctor for medical advice about side effects. You may report side effects to FDA at 1-800-FDA-1088. Where should I keep my medicine? This drug is given in a hospital or clinic and will not be stored at home. NOTE: This sheet is a summary. It may not cover all possible information. If you have questions about this medicine, talk to your doctor, pharmacist, or health care provider.  2020 [employer]/Gold Standard (2008-02-24 16:50:29) Irinotecan injection What is this medicine? IRINOTECAN (ir in oh TEE kan ) is a chemotherapy drug. It is used to treat colon and rectal cancer. This medicine may be used for other purposes; ask your health care provider or pharmacist if you have questions. COMMON BRAND NAME(S): Camptosar What should I tell my health care provider before I take this medicine? They need to know if you have any of these conditions:  dehydration  diarrhea  infection (especially a virus infection such as chickenpox, cold sores, or herpes)  liver disease  low blood counts, like low white cell, platelet, or red cell counts  low levels of calcium, magnesium, or potassium in the blood  recent or ongoing radiation therapy  an unusual or allergic reaction  to irinotecan, other medicines, foods, dyes, or preservatives  pregnant or trying to get pregnant  breast-feeding How should I use this medicine? This drug is given as an infusion into a vein. It is administered in a hospital or clinic by a specially trained health care professional. Talk to your pediatrician regarding the use of this medicine in children. Special care may be needed. Overdosage: If you think you have taken too much of this medicine contact a poison control center or emergency room at once. NOTE: This medicine is only for you. Do not share this medicine with others. What if I miss a dose? It is important not to miss your dose. Call your doctor or health care professional if you are unable to keep an appointment. What may interact with this medicine? This medicine may interact with the following medications:  antiviral medicines for HIV or AIDS  certain antibiotics like rifampin or rifabutin  certain medicines for fungal infections like itraconazole, ketoconazole, posaconazole, and voriconazole  certain medicines for seizures like carbamazepine, phenobarbital, phenotoin  clarithromycin  gemfibrozil  nefazodone  St. John's Wort This list may not describe all possible interactions. Give your health care provider a list of all the medicines, herbs, non-prescription drugs, or dietary supplements you use. Also tell them if you smoke, drink alcohol, or use illegal drugs. Some items may interact with your medicine. What should I watch for while using this medicine? Your condition will be monitored carefully while you are receiving this medicine. You will need important blood work done while you are taking this medicine. This drug may make you feel generally unwell. This is not uncommon, as chemotherapy can affect healthy cells as well as cancer cells. Report any side effects. Continue your course of treatment even though you feel ill unless your doctor tells you to stop. In  some cases, you may be given additional medicines to help with side effects. Follow all directions for their use. You may get drowsy or dizzy. Do not drive, use machinery, or do anything that needs mental alertness until you know how this medicine affects you. Do not stand or sit up quickly, especially if you are an older patient. This reduces the risk of dizzy or fainting spells. Call your health care professional for advice if you get a fever, chills, or sore throat, or other symptoms of a cold or flu. Do not treat yourself. This medicine decreases your body's ability to fight infections. Try to avoid being around people who are sick. Avoid taking products that contain aspirin, acetaminophen, ibuprofen, naproxen, or ketoprofen unless instructed by your doctor. These medicines may hide a fever. This medicine may increase your risk to bruise or bleed. Call your doctor or health care professional if you notice any unusual bleeding. Be careful brushing and flossing your teeth or using a toothpick because you may get an infection or bleed more easily. If you have any dental work done, tell your dentist you are receiving this medicine. Do not become pregnant while taking this medicine or for 6 months after stopping it. Women should inform their health care professional if they wish to become pregnant or think they might be pregnant. Men should  not father a child while taking this medicine and for 3 months after stopping it. There is potential for serious side effects to an unborn child. Talk to your health care professional for more information. Do not breast-feed an infant while taking this medicine or for 7 days after stopping it. This medicine has caused ovarian failure in some women. This medicine may make it more difficult to get pregnant. Talk to your health care professional if you are concerned about your fertility. This medicine has caused decreased sperm counts in some men. This may make it more  difficult to father a child. Talk to your health care professional if you are concerned about your fertility. What side effects may I notice from receiving this medicine? Side effects that you should report to your doctor or health care professional as soon as possible:  allergic reactions like skin rash, itching or hives, swelling of the face, lips, or tongue  chest pain  diarrhea  flushing, runny nose, sweating during infusion  low blood counts - this medicine may decrease the number of white blood cells, red blood cells and platelets. You may be at increased risk for infections and bleeding.  nausea, vomiting  pain, swelling, warmth in the leg  signs of decreased platelets or bleeding - bruising, pinpoint red spots on the skin, black, tarry stools, blood in the urine  signs of infection - fever or chills, cough, sore throat, pain or difficulty passing urine  signs of decreased red blood cells - unusually weak or tired, fainting spells, lightheadedness Side effects that usually do not require medical attention (report to your doctor or health care professional if they continue or are bothersome):  constipation  hair loss  headache  loss of appetite  mouth sores  stomach pain This list may not describe all possible side effects. Call your doctor for medical advice about side effects. You may report side effects to FDA at 1-800-FDA-1088. Where should I keep my medicine? This drug is given in a hospital or clinic and will not be stored at home. NOTE: This sheet is a summary. It may not cover all possible information. If you have questions about this medicine, talk to your doctor, pharmacist, or health care provider.  2020 [employer]/Gold Standard (2018-10-10 10:09:17) Oxaliplatin Injection What is this medicine? OXALIPLATIN (ox AL i PLA tin) is a chemotherapy drug. It targets fast dividing cells, like cancer cells, and causes these cells to die. This medicine is used to treat  cancers of the colon and rectum, and many other cancers. This medicine may be used for other purposes; ask your health care provider or pharmacist if you have questions. COMMON BRAND NAME(S): Eloxatin What should I tell my health care provider before I take this medicine? They need to know if you have any of these conditions:  heart disease  history of irregular heartbeat  liver disease  low blood counts, like white cells, platelets, or red blood cells  lung or breathing disease, like asthma  take medicines that treat or prevent blood clots  tingling of the fingers or toes, or other nerve disorder  an unusual or allergic reaction to oxaliplatin, other chemotherapy, other medicines, foods, dyes, or preservatives  pregnant or trying to get pregnant  breast-feeding How should I use this medicine? This drug is given as an infusion into a vein. It is administered in a hospital or clinic by a specially trained health care professional. Talk to your pediatrician regarding the use of this medicine in  children. Special care may be needed. Overdosage: If you think you have taken too much of this medicine contact a poison control center or emergency room at once. NOTE: This medicine is only for you. Do not share this medicine with others. What if I miss a dose? It is important not to miss a dose. Call your doctor or health care professional if you are unable to keep an appointment. What may interact with this medicine? Do not take this medicine with any of the following medications:  cisapride  dronedarone  pimozide  thioridazine This medicine may also interact with the following medications:  aspirin and aspirin-like medicines  certain medicines that treat or prevent blood clots like warfarin, apixaban, dabigatran, and rivaroxaban  cisplatin  cyclosporine  diuretics  medicines for infection like acyclovir, adefovir, amphotericin B, bacitracin, cidofovir, foscarnet,  ganciclovir, gentamicin, pentamidine, vancomycin  NSAIDs, medicines for pain and inflammation, like ibuprofen or naproxen  other medicines that prolong the QT interval (an abnormal heart rhythm)  pamidronate  zoledronic acid This list may not describe all possible interactions. Give your health care provider a list of all the medicines, herbs, non-prescription drugs, or dietary supplements you use. Also tell them if you smoke, drink alcohol, or use illegal drugs. Some items may interact with your medicine. What should I watch for while using this medicine? Your condition will be monitored carefully while you are receiving this medicine. You may need blood work done while you are taking this medicine. This medicine may make you feel generally unwell. This is not uncommon as chemotherapy can affect healthy cells as well as cancer cells. Report any side effects. Continue your course of treatment even though you feel ill unless your healthcare professional tells you to stop. This medicine can make you more sensitive to cold. Do not drink cold drinks or use ice. Cover exposed skin before coming in contact with cold temperatures or cold objects. When out in cold weather wear warm clothing and cover your mouth and nose to warm the air that goes into your lungs. Tell your doctor if you get sensitive to the cold. Do not become pregnant while taking this medicine or for 9 months after stopping it. Women should inform their health care professional if they wish to become pregnant or think they might be pregnant. Men should not father a child while taking this medicine and for 6 months after stopping it. There is potential for serious side effects to an unborn child. Talk to your health care professional for more information. Do not breast-feed a child while taking this medicine or for 3 months after stopping it. This medicine has caused ovarian failure in some women. This medicine may make it more difficult to  get pregnant. Talk to your health care professional if you are concerned about your fertility. This medicine has caused decreased sperm counts in some men. This may make it more difficult to father a child. Talk to your health care professional if you are concerned about your fertility. This medicine may increase your risk of getting an infection. Call your health care professional for advice if you get a fever, chills, or sore throat, or other symptoms of a cold or flu. Do not treat yourself. Try to avoid being around people who are sick. Avoid taking medicines that contain aspirin, acetaminophen, ibuprofen, naproxen, or ketoprofen unless instructed by your health care professional. These medicines may hide a fever. Be careful brushing or flossing your teeth or using a toothpick because you  may get an infection or bleed more easily. If you have any dental work done, tell your dentist you are receiving this medicine. What side effects may I notice from receiving this medicine? Side effects that you should report to your doctor or health care professional as soon as possible:  allergic reactions like skin rash, itching or hives, swelling of the face, lips, or tongue  breathing problems  cough  low blood counts - this medicine may decrease the number of white blood cells, red blood cells, and platelets. You may be at increased risk for infections and bleeding  nausea, vomiting  pain, redness, or irritation at site where injected  pain, tingling, numbness in the hands or feet  signs and symptoms of bleeding such as bloody or black, tarry stools; red or dark brown urine; spitting up blood or brown material that looks like coffee grounds; red spots on the skin; unusual bruising or bleeding from the eyes, gums, or nose  signs and symptoms of a dangerous change in heartbeat or heart rhythm like chest pain; dizziness; fast, irregular heartbeat; palpitations; feeling faint or lightheaded;  falls  signs and symptoms of infection like fever; chills; cough; sore throat; pain or trouble passing urine  signs and symptoms of liver injury like dark yellow or brown urine; general ill feeling or flu-like symptoms; light-colored stools; loss of appetite; nausea; right upper belly pain; unusually weak or tired; yellowing of the eyes or skin  signs and symptoms of low red blood cells or anemia such as unusually weak or tired; feeling faint or lightheaded; falls  signs and symptoms of muscle injury like dark urine; trouble passing urine or change in the amount of urine; unusually weak or tired; muscle pain; back pain Side effects that usually do not require medical attention (report to your doctor or health care professional if they continue or are bothersome):  changes in taste  diarrhea  gas  hair loss  loss of appetite  mouth sores This list may not describe all possible side effects. Call your doctor for medical advice about side effects. You may report side effects to FDA at 1-800-FDA-1088. Where should I keep my medicine? This drug is given in a hospital or clinic and will not be stored at home. NOTE: This sheet is a summary. It may not cover all possible information. If you have questions about this medicine, talk to your doctor, pharmacist, or health care provider.  2020 [employer] Standard (2019-01-07 12:20:35)

## 2020-02-04 ENCOUNTER — Other Ambulatory Visit: Payer: Self-pay | Admitting: *Deleted

## 2020-02-04 DIAGNOSIS — C787 Secondary malignant neoplasm of liver and intrahepatic bile duct: Secondary | ICD-10-CM

## 2020-02-04 LAB — CANCER ANTIGEN 19-9

## 2020-02-05 ENCOUNTER — Inpatient Hospital Stay: Payer: BC Managed Care – PPO

## 2020-02-05 ENCOUNTER — Other Ambulatory Visit: Payer: Self-pay

## 2020-02-05 ENCOUNTER — Other Ambulatory Visit: Payer: BC Managed Care – PPO

## 2020-02-05 VITALS — BP 103/69 | HR 76 | Temp 97.1°F | Resp 20

## 2020-02-05 DIAGNOSIS — C787 Secondary malignant neoplasm of liver and intrahepatic bile duct: Secondary | ICD-10-CM | POA: Diagnosis not present

## 2020-02-05 DIAGNOSIS — Z5111 Encounter for antineoplastic chemotherapy: Secondary | ICD-10-CM | POA: Diagnosis not present

## 2020-02-05 DIAGNOSIS — C259 Malignant neoplasm of pancreas, unspecified: Secondary | ICD-10-CM

## 2020-02-05 DIAGNOSIS — R978 Other abnormal tumor markers: Secondary | ICD-10-CM | POA: Diagnosis not present

## 2020-02-05 DIAGNOSIS — D696 Thrombocytopenia, unspecified: Secondary | ICD-10-CM | POA: Diagnosis not present

## 2020-02-05 DIAGNOSIS — C250 Malignant neoplasm of head of pancreas: Secondary | ICD-10-CM | POA: Diagnosis not present

## 2020-02-05 DIAGNOSIS — R1011 Right upper quadrant pain: Secondary | ICD-10-CM | POA: Diagnosis not present

## 2020-02-05 MED ORDER — HEPARIN SOD (PORK) LOCK FLUSH 100 UNIT/ML IV SOLN
500.0000 [IU] | Freq: Once | INTRAVENOUS | Status: AC | PRN
  Administered 2020-02-05: 500 [IU]
  Filled 2020-02-05: qty 5

## 2020-02-05 MED ORDER — SODIUM CHLORIDE 0.9% FLUSH
10.0000 mL | INTRAVENOUS | Status: DC | PRN
  Administered 2020-02-05: 10 mL
  Filled 2020-02-05: qty 10

## 2020-02-05 MED ORDER — PEGFILGRASTIM-JMDB 6 MG/0.6ML ~~LOC~~ SOSY
PREFILLED_SYRINGE | SUBCUTANEOUS | Status: AC
  Filled 2020-02-05: qty 0.6

## 2020-02-05 MED ORDER — PEGFILGRASTIM-JMDB 6 MG/0.6ML ~~LOC~~ SOSY
6.0000 mg | PREFILLED_SYRINGE | Freq: Once | SUBCUTANEOUS | Status: AC
  Administered 2020-02-05: 6 mg via SUBCUTANEOUS

## 2020-02-09 ENCOUNTER — Encounter: Payer: Self-pay | Admitting: *Deleted

## 2020-02-10 ENCOUNTER — Ambulatory Visit (HOSPITAL_BASED_OUTPATIENT_CLINIC_OR_DEPARTMENT_OTHER)
Admission: RE | Admit: 2020-02-10 | Discharge: 2020-02-10 | Disposition: A | Discharge status: Home / Self Care | Payer: BC Managed Care – PPO | Source: Ambulatory Visit | Attending: Family | Admitting: Family

## 2020-02-10 ENCOUNTER — Encounter (HOSPITAL_BASED_OUTPATIENT_CLINIC_OR_DEPARTMENT_OTHER): Payer: Self-pay

## 2020-02-10 ENCOUNTER — Encounter: Payer: Self-pay | Admitting: Hematology & Oncology

## 2020-02-10 ENCOUNTER — Other Ambulatory Visit: Payer: Self-pay

## 2020-02-10 DIAGNOSIS — C787 Secondary malignant neoplasm of liver and intrahepatic bile duct: Secondary | ICD-10-CM | POA: Diagnosis not present

## 2020-02-10 DIAGNOSIS — C259 Malignant neoplasm of pancreas, unspecified: Secondary | ICD-10-CM | POA: Diagnosis not present

## 2020-02-10 LAB — CANCER ANTIGEN 19-9: CA 19-9: 2536 U/mL — ABNORMAL HIGH (ref 0–35)

## 2020-02-10 MED ORDER — IOHEXOL 300 MG/ML  SOLN
100.0000 mL | Freq: Once | INTRAMUSCULAR | Status: AC | PRN
  Administered 2020-02-10: 100 mL via INTRAVENOUS

## 2020-02-11 ENCOUNTER — Other Ambulatory Visit: Payer: Self-pay | Admitting: *Deleted

## 2020-02-11 ENCOUNTER — Encounter: Payer: Self-pay | Admitting: Hematology & Oncology

## 2020-02-11 ENCOUNTER — Inpatient Hospital Stay: Payer: BC Managed Care – PPO

## 2020-02-11 ENCOUNTER — Inpatient Hospital Stay (HOSPITAL_BASED_OUTPATIENT_CLINIC_OR_DEPARTMENT_OTHER): Payer: BC Managed Care – PPO | Admitting: Hematology & Oncology

## 2020-02-11 VITALS — BP 115/77 | HR 97 | Temp 98.0°F | Resp 18 | Wt 191.0 lb

## 2020-02-11 DIAGNOSIS — C250 Malignant neoplasm of head of pancreas: Secondary | ICD-10-CM | POA: Diagnosis not present

## 2020-02-11 DIAGNOSIS — C787 Secondary malignant neoplasm of liver and intrahepatic bile duct: Secondary | ICD-10-CM | POA: Diagnosis not present

## 2020-02-11 DIAGNOSIS — R399 Unspecified symptoms and signs involving the genitourinary system: Secondary | ICD-10-CM

## 2020-02-11 DIAGNOSIS — C259 Malignant neoplasm of pancreas, unspecified: Secondary | ICD-10-CM | POA: Diagnosis not present

## 2020-02-11 DIAGNOSIS — D696 Thrombocytopenia, unspecified: Secondary | ICD-10-CM | POA: Diagnosis not present

## 2020-02-11 DIAGNOSIS — R1011 Right upper quadrant pain: Secondary | ICD-10-CM | POA: Diagnosis not present

## 2020-02-11 DIAGNOSIS — N3000 Acute cystitis without hematuria: Secondary | ICD-10-CM

## 2020-02-11 DIAGNOSIS — R978 Other abnormal tumor markers: Secondary | ICD-10-CM | POA: Diagnosis not present

## 2020-02-11 DIAGNOSIS — Z5111 Encounter for antineoplastic chemotherapy: Secondary | ICD-10-CM | POA: Diagnosis not present

## 2020-02-11 LAB — URINALYSIS, COMPLETE (UACMP) WITH MICROSCOPIC
Bilirubin Urine: NEGATIVE
Glucose, UA: NEGATIVE mg/dL
Ketones, ur: NEGATIVE mg/dL
Nitrite: NEGATIVE
Protein, ur: NEGATIVE mg/dL
Specific Gravity, Urine: 1.015 (ref 1.005–1.030)
WBC, UA: >50 WBC/hpf (ref 0–5)
pH: 6 (ref 5.0–8.0)

## 2020-02-11 NOTE — Telephone Encounter (Signed)
Opened in error

## 2020-02-11 NOTE — Progress Notes (Signed)
Hematology and Oncology Follow Up Visit  Jeffrey FRAMPTON [id number] Mar 21, 1956 64 y.o. 02/11/2020   Principle Diagnosis:  Metastatic adenocarcinoma of the pancreas - hepatic mets  Current Therapy: FOLFIRINOX -- started on 08/11/2019, s/p cycle 11 - d/c on 02/10/2020 due to progression Abraxane/Gemzar -- start cycle #1 on 02/22/2020   Interim History:  Jeffrey Harris is here today for an early visit.  Unfortunately, we now have seen progressive disease.  When we recently saw him, we did his CA 19-9.  This is going up even further.  It was now over 2300.  The CA 19-9 is always been a very good marker for him for disease response.  We then set him up with a CT scan.  This was done on 02/10/2020.  This, unfortunately showed progressive disease, surprisingly not in the liver, but in the peritoneal lymph nodes.  The liver actually looks relatively stable as well as the actual primary in the pancreas.  He is having some urinary issues.  We had a urinalysis on him today.  He does have white cells in the urine.  We will see what the culture shows.  He recently was on Bactrim.  We are going to have to make a change in his protocol.  I think the next option for him would be Abraxane/Gemzar.  I think this would be a a very reasonable second line approach.  He seems to be doing okay otherwise.  He is having no abdominal pain.  He is having no nausea or vomiting.  He is not having diarrhea.  He is having no cough or shortness of breath.  There is no leg swelling.  He has had no rashes.  Overall, his performance status is ECOG 1.      Medications:  Allergies as of 02/11/2020   No Known Allergies     Medication List       Accurate as of February 11, 2020  5:05 PM. If you have any questions, ask your nurse or doctor.        dexamethasone 4 MG tablet Commonly known as: DECADRON TAKE 2 TABLETS BY MOUTH DAILY. START THE DAY AFTER CHEMOTHERAPY FOR 3 DAYS. TAKE WITH FOOD.   diphenoxylate-atropine  2.5-0.025 MG tablet Commonly known as: LOMOTIL Take 1-2 tablets by mouth 4 (four) times daily as needed for diarrhea or loose stools.   hydrocortisone 25 MG suppository Commonly known as: ANUSOL-HC PLACE 1 SUPPOSITORY (25 MG TOTAL) RECTALLY 2 (TWO) TIMES DAILY. FOR 7 DAYS   lactulose 10 GM/15ML solution Commonly known as: CHRONULAC SMARTSIG:40 Milliliter(s) By Mouth Morning-Evening   linaclotide 145 MCG Caps capsule Commonly known as: Linzess Take 1 capsule (145 mcg total) by mouth daily before breakfast.   lipase/protease/amylase 36000 UNITS Cpep capsule Commonly known as: Creon Take 2 capsules (72,000 Units total) by mouth 3 (three) times daily before meals.   loperamide 2 MG tablet Commonly known as: Imodium A-D Take 2 at onset of diarrhea, then 1 every 2hrs until 12hr without a BM. May take 2 tab every 4hrs at bedtime. If diarrhea recurs repeat.   LORazepam 0.5 MG tablet Commonly known as: Ativan Take 1 tablet (0.5 mg total) by mouth every 6 (six) hours as needed for anxiety.   ondansetron 8 MG tablet Commonly known as: Zofran Take 1 tablet (8 mg total) by mouth 2 (two) times daily as needed. Start on day 3 after chemotherapy.   oxyCODONE-acetaminophen 5-325 MG tablet Commonly known as: PERCOCET/ROXICET Take 1 tablet by mouth every  6 (six) hours as needed for severe pain.   pantoprazole 40 MG tablet Commonly known as: PROTONIX TAKE 1 TABLET BY MOUTH TWICE A DAY   potassium chloride SA 20 MEQ tablet Commonly known as: Klor-Con M20 Take 2 tablets (40 mEq total) by mouth daily.   prochlorperazine 10 MG tablet Commonly known as: COMPAZINE Take 1 tablet (10 mg total) by mouth every 6 (six) hours as needed (Nausea or vomiting).   sulfamethoxazole-trimethoprim 800-160 MG tablet Commonly known as: BACTRIM DS Take 1 tablet by mouth 2 (two) times daily.   tamsulosin 0.4 MG Caps capsule Commonly known as: FLOMAX Take 0.4 mg by mouth daily.   zolpidem 5 MG  tablet Commonly known as: AMBIEN Take 1 tablet (5 mg total) by mouth at bedtime as needed for sleep.       Allergies: No Known Allergies  Past Medical History, Surgical history, Social history, and Family History were reviewed and updated.  Review of Systems: Review of Systems  Constitutional: Negative.   HENT: Negative.   Eyes: Negative.   Respiratory: Negative.   Cardiovascular: Negative.   Gastrointestinal: Negative.   Genitourinary: Negative.   Musculoskeletal: Negative.   Skin: Negative.   Neurological: Negative.   Endo/Heme/Allergies: Negative.   Psychiatric/Behavioral: Negative.      Physical Exam:  weight is 191 lb (86.6 kg). His oral temperature is 98 F (36.7 C). His blood pressure is 115/77 and his pulse is 97. His respiration is 18 and oxygen saturation is 100%.   Wt Readings from Last 3 Encounters:  02/11/20 191 lb (86.6 kg)  02/03/20 193 lb (87.5 kg)  01/27/20 194 lb 1 oz (88 kg)    Physical Exam Vitals reviewed.  HENT:     Head: Normocephalic and atraumatic.  Eyes:     Pupils: Pupils are equal, round, and reactive to light.  Cardiovascular:     Rate and Rhythm: Normal rate and regular rhythm.     Heart sounds: Normal heart sounds.  Pulmonary:     Effort: Pulmonary effort is normal.     Breath sounds: Normal breath sounds.  Abdominal:     General: Bowel sounds are normal.     Palpations: Abdomen is soft.  Musculoskeletal:        General: No tenderness or deformity. Normal range of motion.     Cervical back: Normal range of motion.  Lymphadenopathy:     Cervical: No cervical adenopathy.  Skin:    General: Skin is warm and dry.     Findings: No erythema or rash.  Neurological:     Mental Status: He is alert and oriented to person, place, and time.  Psychiatric:        Behavior: Behavior normal.        Thought Content: Thought content normal.        Judgment: Judgment normal.      Lab Results  Component Value Date   WBC 2.6 (L)  02/03/2020   HGB 9.3 (L) 02/03/2020   HCT 27.9 (L) 02/03/2020   MCV 109.4 (H) 02/03/2020   PLT 108 (L) 02/03/2020   Lab Results  Component Value Date   FERRITIN 1,046 (H) 12/02/2019   IRON 97 12/02/2019   TIBC 244 12/02/2019   UIBC 146 12/02/2019   IRONPCTSAT 40 12/02/2019   Lab Results  Component Value Date   RBC 2.55 (L) 02/03/2020   No results found for: KPAFRELGTCHN, LAMBDASER, KAPLAMBRATIO No results found for: IGGSERUM, IGA, IGMSERUM No results found for: TOTALPROTELP,  ALBUMINELP, A1GS, Gilford Silvius, MSPIKE, SPEI   Chemistry      Component Value Date/Time   NA 133 (L) 02/03/2020 0815   K 4.1 02/03/2020 0815   CL 103 02/03/2020 0815   CO2 23 02/03/2020 0815   BUN 6 (L) 02/03/2020 0815   CREATININE 0.59 (L) 02/03/2020 0815      Component Value Date/Time   CALCIUM 9.2 02/03/2020 0815   ALKPHOS 229 (H) 02/03/2020 0815   AST 45 (H) 02/03/2020 0815   ALT 43 02/03/2020 0815   BILITOT 0.3 02/03/2020 0815       Impression and Plan:   Jeffrey Harris is a nice 64 year old white male with metastatic pancreatic cancer.  He had been on FOLFIRINOX and had responded nicely.  His CA 19-9 to come down quite well.  However, recently it has been going back up.  We then proved progressive disease with the CT scan.  Again, I believe that Abraxane/Gemzar would be a very good option.  We will go ahead and get him started the next time we see him.  We will have to watch out with his blood counts.  We may have to make a dosage adjustment.  I hate this for Jeffrey Harris.  He really has done everything we have asked him to do.  He has a great attitude.  He has good support.  Hopefully, we will see a response again.  I am glad that he is not having progression in the liver.  This was definitely make things a little bit more difficult with respect to treatment response.  We will keep his appointments as scheduled previously.  Spent about 45 minutes with him today.  This was quite  complicated given that he now has progressive disease.  We had him make a change in his protocol.  I reviewed his scans with him and his lab work.     Volanda Napoleon, MD 6/10/20215:05 PM

## 2020-02-11 NOTE — Progress Notes (Signed)
DISCONTINUE ON PATHWAY REGIMEN - Pancreatic Adenocarcinoma     A cycle is every 14 days:     Oxaliplatin      Leucovorin      Irinotecan      Fluorouracil   **Always confirm dose/schedule in your pharmacy ordering system**  REASON: Disease Progression PRIOR TREATMENT: [id number]: mFOLFIRINOX q14 Days Until Progression or Toxicity TREATMENT RESPONSE: Partial Response (PR)  START ON PATHWAY REGIMEN - Pancreatic Adenocarcinoma     A cycle is every 28 days:     Nab-paclitaxel (protein bound)      Gemcitabine   **Always confirm dose/schedule in your pharmacy ordering system**  Patient Characteristics: Metastatic Disease, First Line, PS = 0,1, BRCA1/2 and PALB2  Mutation Absent/Unknown Therapeutic Status: Metastatic Disease Line of Therapy: First Line ECOG Performance Status: 1 BRCA1/2 Mutation Status: Absent PALB2 Mutation Status: Absent Intent of Therapy: Non-Curative / Palliative Intent, Discussed with Patient

## 2020-02-13 ENCOUNTER — Encounter: Payer: Self-pay | Admitting: *Deleted

## 2020-02-13 ENCOUNTER — Encounter: Payer: Self-pay | Admitting: Hematology & Oncology

## 2020-02-13 LAB — URINE CULTURE: Culture: 10000 — AB

## 2020-02-14 LAB — URINE CULTURE: Culture: 40000 — AB

## 2020-02-15 ENCOUNTER — Other Ambulatory Visit: Payer: Self-pay | Admitting: *Deleted

## 2020-02-15 ENCOUNTER — Encounter: Payer: Self-pay | Admitting: *Deleted

## 2020-02-15 MED ORDER — CIPROFLOXACIN HCL 500 MG PO TABS
500.0000 mg | ORAL_TABLET | Freq: Two times a day (BID) | ORAL | 0 refills | Status: DC
Start: 2020-02-15 — End: 2020-02-24

## 2020-02-17 ENCOUNTER — Encounter: Payer: Self-pay | Admitting: Family

## 2020-02-17 ENCOUNTER — Other Ambulatory Visit: Payer: Self-pay | Admitting: *Deleted

## 2020-02-17 MED ORDER — GABAPENTIN 300 MG PO CAPS
300.0000 mg | ORAL_CAPSULE | Freq: Three times a day (TID) | ORAL | 0 refills | Status: DC
Start: 2020-02-17 — End: 2020-03-23

## 2020-02-17 NOTE — Progress Notes (Signed)
Pharmacist Chemotherapy Monitoring - Initial Assessment    Anticipated start date: 02/24/20  Regimen:  . Are orders appropriate based on the patient's diagnosis, regimen, and cycle? Yes . Does the plan date match the patient's scheduled date? Yes . Is the sequencing of drugs appropriate? Yes . Are the premedications appropriate for the patient's regimen? Yes . Prior Authorization for treatment is: Approved o If applicable, is the correct biosimilar selected based on the patient's insurance? not applicable  Organ Function and Labs: [name] Are dose adjustments needed based on the patient's renal function, hepatic function, or hematologic function? No . Are appropriate labs ordered prior to the start of patient's treatment? Yes . Other organ system assessment, if indicated: N/A . The following baseline labs, if indicated, have been ordered: N/A  Dose Assessment: . Are the drug doses appropriate? Yes . Are the following correct: o Drug concentrations Yes o IV fluid compatible with drug Yes o Administration routes Yes o Timing of therapy Yes . If applicable, does the patient have documented access for treatment and/or plans for port-a-cath placement? yes . If applicable, have lifetime cumulative doses been properly documented and assessed? not applicable Lifetime Dose Tracking  . Oxaliplatin: 1,004.519 mg/m2 (2,150 mg) = 167.42 % of the maximum lifetime dose of 600 mg/m2  o   Toxicity Monitoring/Prevention: . The patient has the following take home antiemetics prescribed: Prochlorperazine . The patient has the following take home medications prescribed: N/A . Medication allergies and previous infusion related reactions, if applicable, have been reviewed and addressed. Yes . The patient's current medication list has been assessed for drug-drug interactions with their chemotherapy regimen. no significant drug-drug interactions were identified on review.  Order Review: . Are the treatment  plan orders signed? Yes . Is the patient scheduled to see a provider prior to their treatment? Yes  I verify that I have reviewed each item in the above checklist and answered each question accordingly.  Yancy Hascall, [name] 02/17/2020 8:44 AM

## 2020-02-24 ENCOUNTER — Inpatient Hospital Stay: Payer: BC Managed Care – PPO

## 2020-02-24 ENCOUNTER — Other Ambulatory Visit: Payer: Self-pay

## 2020-02-24 ENCOUNTER — Encounter: Payer: Self-pay | Admitting: Hematology & Oncology

## 2020-02-24 ENCOUNTER — Telehealth: Payer: Self-pay | Admitting: Hematology & Oncology

## 2020-02-24 ENCOUNTER — Inpatient Hospital Stay (HOSPITAL_BASED_OUTPATIENT_CLINIC_OR_DEPARTMENT_OTHER): Payer: BC Managed Care – PPO | Admitting: Hematology & Oncology

## 2020-02-24 VITALS — BP 98/79 | HR 88 | Temp 97.3°F | Resp 18 | Wt 195.2 lb

## 2020-02-24 DIAGNOSIS — C787 Secondary malignant neoplasm of liver and intrahepatic bile duct: Secondary | ICD-10-CM | POA: Diagnosis not present

## 2020-02-24 DIAGNOSIS — C250 Malignant neoplasm of head of pancreas: Secondary | ICD-10-CM | POA: Diagnosis not present

## 2020-02-24 DIAGNOSIS — C259 Malignant neoplasm of pancreas, unspecified: Secondary | ICD-10-CM

## 2020-02-24 DIAGNOSIS — R1011 Right upper quadrant pain: Secondary | ICD-10-CM | POA: Diagnosis not present

## 2020-02-24 DIAGNOSIS — Z5111 Encounter for antineoplastic chemotherapy: Secondary | ICD-10-CM | POA: Diagnosis not present

## 2020-02-24 DIAGNOSIS — D696 Thrombocytopenia, unspecified: Secondary | ICD-10-CM | POA: Diagnosis not present

## 2020-02-24 DIAGNOSIS — R978 Other abnormal tumor markers: Secondary | ICD-10-CM | POA: Diagnosis not present

## 2020-02-24 LAB — CBC WITH DIFFERENTIAL (CANCER CENTER ONLY)
Abs Immature Granulocytes: 0.02 10*3/uL (ref 0.00–0.07)
Basophils Absolute: 0 10*3/uL (ref 0.0–0.1)
Basophils Relative: 1 %
Eosinophils Absolute: 0 10*3/uL (ref 0.0–0.5)
Eosinophils Relative: 0 %
HCT: 29.3 % — ABNORMAL LOW (ref 39.0–52.0)
Hemoglobin: 9.9 g/dL — ABNORMAL LOW (ref 13.0–17.0)
Immature Granulocytes: 1 %
Lymphocytes Relative: 38 %
Lymphs Abs: 1.2 10*3/uL (ref 0.7–4.0)
MCH: 36.7 pg — ABNORMAL HIGH (ref 26.0–34.0)
MCHC: 33.8 g/dL (ref 30.0–36.0)
MCV: 108.5 fL — ABNORMAL HIGH (ref 80.0–100.0)
Monocytes Absolute: 0.3 10*3/uL (ref 0.1–1.0)
Monocytes Relative: 11 %
Neutro Abs: 1.5 10*3/uL — ABNORMAL LOW (ref 1.7–7.7)
Neutrophils Relative %: 49 %
Platelet Count: 98 10*3/uL — ABNORMAL LOW (ref 150–400)
RBC: 2.7 MIL/uL — ABNORMAL LOW (ref 4.22–5.81)
RDW: 15.3 % (ref 11.5–15.5)
WBC Count: 3 10*3/uL — ABNORMAL LOW (ref 4.0–10.5)
nRBC: 0 % (ref 0.0–0.2)

## 2020-02-24 LAB — CMP (CANCER CENTER ONLY)
ALT: 26 U/L (ref 0–44)
AST: 30 U/L (ref 15–41)
Albumin: 3.7 g/dL (ref 3.5–5.0)
Alkaline Phosphatase: 207 U/L — ABNORMAL HIGH (ref 38–126)
Anion gap: 8 (ref 5–15)
BUN: 6 mg/dL — ABNORMAL LOW (ref 8–23)
CO2: 26 mmol/L (ref 22–32)
Calcium: 9.2 mg/dL (ref 8.9–10.3)
Chloride: 102 mmol/L (ref 98–111)
Creatinine: 0.54 mg/dL — ABNORMAL LOW (ref 0.61–1.24)
GFR, Est AFR Am: >60 mL/min (ref >60)
GFR, Estimated: >60 mL/min (ref >60)
Glucose, Bld: 103 mg/dL — ABNORMAL HIGH (ref 70–99)
Potassium: 3.9 mmol/L (ref 3.5–5.1)
Sodium: 136 mmol/L (ref 135–145)
Total Bilirubin: 0.5 mg/dL (ref 0.3–1.2)
Total Protein: 7 g/dL (ref 6.5–8.1)

## 2020-02-24 LAB — LACTATE DEHYDROGENASE: LDH: 275 U/L — ABNORMAL HIGH (ref 98–192)

## 2020-02-24 MED ORDER — PROCHLORPERAZINE MALEATE 10 MG PO TABS
ORAL_TABLET | ORAL | Status: AC
  Filled 2020-02-24: qty 1

## 2020-02-24 MED ORDER — HEPARIN SOD (PORK) LOCK FLUSH 100 UNIT/ML IV SOLN
500.0000 [IU] | Freq: Once | INTRAVENOUS | Status: AC | PRN
  Administered 2020-02-24: 500 [IU]
  Filled 2020-02-24: qty 5

## 2020-02-24 MED ORDER — PACLITAXEL PROTEIN-BOUND CHEMO INJECTION 100 MG
112.5000 mg/m2 | Freq: Once | INTRAVENOUS | Status: AC
  Administered 2020-02-24: 225 mg via INTRAVENOUS
  Filled 2020-02-24: qty 45

## 2020-02-24 MED ORDER — SODIUM CHLORIDE 0.9 % IV SOLN
Freq: Once | INTRAVENOUS | Status: AC
  Filled 2020-02-24: qty 250

## 2020-02-24 MED ORDER — SODIUM CHLORIDE 0.9% FLUSH
10.0000 mL | INTRAVENOUS | Status: DC | PRN
  Administered 2020-02-24: 10 mL
  Filled 2020-02-24: qty 10

## 2020-02-24 MED ORDER — SODIUM CHLORIDE 0.9 % IV SOLN
900.0000 mg/m2 | Freq: Once | INTRAVENOUS | Status: AC
  Administered 2020-02-24: 1862 mg via INTRAVENOUS
  Filled 2020-02-24: qty 48.97

## 2020-02-24 MED ORDER — SODIUM CHLORIDE 0.9% FLUSH
10.0000 mL | Freq: Once | INTRAVENOUS | Status: AC
  Administered 2020-02-24: 10 mL
  Filled 2020-02-24: qty 10

## 2020-02-24 MED ORDER — PROCHLORPERAZINE MALEATE 10 MG PO TABS
10.0000 mg | ORAL_TABLET | Freq: Once | ORAL | Status: AC
  Administered 2020-02-24: 10 mg via ORAL

## 2020-02-24 NOTE — Patient Instructions (Addendum)
[facility] Discharge Instructions for Patients Receiving Chemotherapy  Today you received the following chemotherapy agents Abraxane/Gemzar To help prevent nausea and vomiting after your treatment, we encourage you to take your nausea medication as prescribed.  If you develop nausea and vomiting that is not controlled by your nausea medication, call the clinic.   BELOW ARE SYMPTOMS THAT SHOULD BE REPORTED IMMEDIATELY:  *FEVER GREATER THAN 100.5 F  *CHILLS WITH OR WITHOUT FEVER  NAUSEA AND VOMITING THAT IS NOT CONTROLLED WITH YOUR NAUSEA MEDICATION  *UNUSUAL SHORTNESS OF BREATH  *UNUSUAL BRUISING OR BLEEDING  TENDERNESS IN MOUTH AND THROAT WITH OR WITHOUT PRESENCE OF ULCERS  *URINARY PROBLEMS  *BOWEL PROBLEMS  UNUSUAL RASH Items with * indicate a potential emergency and should be followed up as soon as possible.  Feel free to call the clinic should you have any questions or concerns. The clinic phone number is [phone].  Please show the [facility] at check-in to the Emergency Department and triage nurse  Nanoparticle Albumin-Bound Paclitaxel injection (Abraxane) What is this medicine? NANOPARTICLE ALBUMIN-BOUND PACLITAXEL (Na no PAHR ti kuhl al BYOO muhn-bound PAK li TAX el) is a chemotherapy drug. It targets fast dividing cells, like cancer cells, and causes these cells to die. This medicine is used to treat advanced breast cancer, lung cancer, and pancreatic cancer. This medicine may be used for other purposes; ask your health care provider or pharmacist if you have questions. COMMON BRAND NAME(S): Abraxane What should I tell my health care provider before I take this medicine? They need to know if you have any of these conditions:  kidney disease  liver disease  low blood counts, like low white cell, platelet, or red cell counts  lung or breathing disease, like asthma  tingling of the fingers or toes, or other nerve disorder  an unusual  or allergic reaction to paclitaxel, albumin, other chemotherapy, other medicines, foods, dyes, or preservatives  pregnant or trying to get pregnant  breast-feeding How should I use this medicine? This drug is given as an infusion into a vein. It is administered in a hospital or clinic by a specially trained health care professional. Talk to your pediatrician regarding the use of this medicine in children. Special care may be needed. Overdosage: If you think you have taken too much of this medicine contact a poison control center or emergency room at once. NOTE: This medicine is only for you. Do not share this medicine with others. What if I miss a dose? It is important not to miss your dose. Call your doctor or health care professional if you are unable to keep an appointment. What may interact with this medicine? This medicine may interact with the following medications:  antiviral medicines for hepatitis, HIV or AIDS  certain antibiotics like erythromycin and clarithromycin  certain medicines for fungal infections like ketoconazole and itraconazole  certain medicines for seizures like carbamazepine, phenobarbital, phenytoin  gemfibrozil  nefazodone  rifampin  St. John's wort This list may not describe all possible interactions. Give your health care provider a list of all the medicines, herbs, non-prescription drugs, or dietary supplements you use. Also tell them if you smoke, drink alcohol, or use illegal drugs. Some items may interact with your medicine. What should I watch for while using this medicine? Your condition will be monitored carefully while you are receiving this medicine. You will need important blood work done while you are taking this medicine. This medicine can cause serious allergic reactions.  If you experience allergic reactions like skin rash, itching or hives, swelling of the face, lips, or tongue, tell your doctor or health care professional right away. In  some cases, you may be given additional medicines to help with side effects. Follow all directions for their use. This drug may make you feel generally unwell. This is not uncommon, as chemotherapy can affect healthy cells as well as cancer cells. Report any side effects. Continue your course of treatment even though you feel ill unless your doctor tells you to stop. Call your doctor or health care professional for advice if you get a fever, chills or sore throat, or other symptoms of a cold or flu. Do not treat yourself. This drug decreases your body's ability to fight infections. Try to avoid being around people who are sick. This medicine may increase your risk to bruise or bleed. Call your doctor or health care professional if you notice any unusual bleeding. Be careful brushing and flossing your teeth or using a toothpick because you may get an infection or bleed more easily. If you have any dental work done, tell your dentist you are receiving this medicine. Avoid taking products that contain aspirin, acetaminophen, ibuprofen, naproxen, or ketoprofen unless instructed by your doctor. These medicines may hide a fever. Do not become pregnant while taking this medicine or for 6 months after stopping it. Women should inform their doctor if they wish to become pregnant or think they might be pregnant. Men should not father a child while taking this medicine or for 3 months after stopping it. There is a potential for serious side effects to an unborn child. Talk to your health care professional or pharmacist for more information. Do not breast-feed an infant while taking this medicine or for 2 weeks after stopping it. This medicine may interfere with the ability to get pregnant or to father a child. You should talk to your doctor or health care professional if you are concerned about your fertility. What side effects may I notice from receiving this medicine? Side effects that you should report to your  doctor or health care professional as soon as possible:  allergic reactions like skin rash, itching or hives, swelling of the face, lips, or tongue  breathing problems  changes in vision  fast, irregular heartbeat  low blood pressure  mouth sores  pain, tingling, numbness in the hands or feet  signs of decreased platelets or bleeding - bruising, pinpoint red spots on the skin, black, tarry stools, blood in the urine  signs of decreased red blood cells - unusually weak or tired, feeling faint or lightheaded, falls  signs of infection - fever or chills, cough, sore throat, pain or difficulty passing urine  signs and symptoms of liver injury like dark yellow or brown urine; general ill feeling or flu-like symptoms; light-colored stools; loss of appetite; nausea; right upper belly pain; unusually weak or tired; yellowing of the eyes or skin  swelling of the ankles, feet, hands  unusually slow heartbeat Side effects that usually do not require medical attention (report to your doctor or health care professional if they continue or are bothersome):  diarrhea  hair loss  loss of appetite  nausea, vomiting  tiredness This list may not describe all possible side effects. Call your doctor for medical advice about side effects. You may report side effects to FDA at 1-800-FDA-1088. Where should I keep my medicine? This drug is given in a hospital or clinic and will not be  stored at home. NOTE: This sheet is a summary. It may not cover all possible information. If you have questions about this medicine, talk to your doctor, pharmacist, or health care provider.  2020 Elsevier/Gold Standard (2017-04-23 13:03:45)  Gemcitabine injection (Gemzar) What is this medicine? GEMCITABINE (jem SYE ta been) is a chemotherapy drug. This medicine is used to treat many types of cancer like breast cancer, lung cancer, pancreatic cancer, and ovarian cancer. This medicine may be used for other  purposes; ask your health care provider or pharmacist if you have questions. COMMON BRAND NAME(S): Gemzar, Infugem What should I tell my health care provider before I take this medicine? They need to know if you have any of these conditions:  blood disorders  infection  kidney disease  liver disease  lung or breathing disease, like asthma  recent or ongoing radiation therapy  an unusual or allergic reaction to gemcitabine, other chemotherapy, other medicines, foods, dyes, or preservatives  pregnant or trying to get pregnant  breast-feeding How should I use this medicine? This drug is given as an infusion into a vein. It is administered in a hospital or clinic by a specially trained health care professional. Talk to your pediatrician regarding the use of this medicine in children. Special care may be needed. Overdosage: If you think you have taken too much of this medicine contact a poison control center or emergency room at once. NOTE: This medicine is only for you. Do not share this medicine with others. What if I miss a dose? It is important not to miss your dose. Call your doctor or health care professional if you are unable to keep an appointment. What may interact with this medicine?  medicines to increase blood counts like filgrastim, pegfilgrastim, sargramostim  some other chemotherapy drugs like cisplatin  vaccines Talk to your doctor or health care professional before taking any of these medicines:  acetaminophen  aspirin  ibuprofen  ketoprofen  naproxen This list may not describe all possible interactions. Give your health care provider a list of all the medicines, herbs, non-prescription drugs, or dietary supplements you use. Also tell them if you smoke, drink alcohol, or use illegal drugs. Some items may interact with your medicine. What should I watch for while using this medicine? Visit your doctor for checks on your progress. This drug may make you feel  generally unwell. This is not uncommon, as chemotherapy can affect healthy cells as well as cancer cells. Report any side effects. Continue your course of treatment even though you feel ill unless your doctor tells you to stop. In some cases, you may be given additional medicines to help with side effects. Follow all directions for their use. Call your doctor or health care professional for advice if you get a fever, chills or sore throat, or other symptoms of a cold or flu. Do not treat yourself. This drug decreases your body's ability to fight infections. Try to avoid being around people who are sick. This medicine may increase your risk to bruise or bleed. Call your doctor or health care professional if you notice any unusual bleeding. Be careful brushing and flossing your teeth or using a toothpick because you may get an infection or bleed more easily. If you have any dental work done, tell your dentist you are receiving this medicine. Avoid taking products that contain aspirin, acetaminophen, ibuprofen, naproxen, or ketoprofen unless instructed by your doctor. These medicines may hide a fever. Do not become pregnant while taking this medicine  or for 6 months after stopping it. Women should inform their doctor if they wish to become pregnant or think they might be pregnant. Men should not father a child while taking this medicine and for 3 months after stopping it. There is a potential for serious side effects to an unborn child. Talk to your health care professional or pharmacist for more information. Do not breast-feed an infant while taking this medicine or for at least 1 week after stopping it. Men should inform their doctors if they wish to father a child. This medicine may lower sperm counts. Talk with your doctor or health care professional if you are concerned about your fertility. What side effects may I notice from receiving this medicine? Side effects that you should report to your doctor or  health care professional as soon as possible:  allergic reactions like skin rash, itching or hives, swelling of the face, lips, or tongue  breathing problems  pain, redness, or irritation at site where injected  signs and symptoms of a dangerous change in heartbeat or heart rhythm like chest pain; dizziness; fast or irregular heartbeat; palpitations; feeling faint or lightheaded, falls; breathing problems  signs of decreased platelets or bleeding - bruising, pinpoint red spots on the skin, black, tarry stools, blood in the urine  signs of decreased red blood cells - unusually weak or tired, feeling faint or lightheaded, falls  signs of infection - fever or chills, cough, sore throat, pain or difficulty passing urine  signs and symptoms of kidney injury like trouble passing urine or change in the amount of urine  signs and symptoms of liver injury like dark yellow or brown urine; general ill feeling or flu-like symptoms; light-colored stools; loss of appetite; nausea; right upper belly pain; unusually weak or tired; yellowing of the eyes or skin  swelling of ankles, feet, hands Side effects that usually do not require medical attention (report to your doctor or health care professional if they continue or are bothersome):  constipation  diarrhea  hair loss  loss of appetite  nausea  rash  vomiting This list may not describe all possible side effects. Call your doctor for medical advice about side effects. You may report side effects to FDA at 1-800-FDA-1088. Where should I keep my medicine? This drug is given in a hospital or clinic and will not be stored at home. NOTE: This sheet is a summary. It may not cover all possible information. If you have questions about this medicine, talk to your doctor, pharmacist, or health care provider.  2020 Elsevier/Gold Standard (2017-11-13 18:06:11)

## 2020-02-24 NOTE — Progress Notes (Signed)
Ok to treat with platelets 98 per Dr.Ennever

## 2020-02-24 NOTE — Telephone Encounter (Signed)
Appointments scheduled calendar        Printed per 6/23 los

## 2020-02-24 NOTE — Progress Notes (Signed)
Hematology and Oncology Follow Up Visit  Jeffrey Harris [id number] [birth year] 64 y.o. 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 He is doing quite well. He feels okay. We were going to make a change with his chemotherapy because of progressive disease.  His last CA 19-I was up to 2536.  He is having a little bit of right upper quadrant abdominal pain. He really does not take much for this.  His appetite is good. His weight is up a little bit. He has had no problems with diarrhea.  There is no cough or shortness of breath. He has had no chest wall pain. He has had no leg swelling. There is been no rashes.  Overall, his performance status is ECOG 1.      Medications:  Allergies as of 02/24/2020   No Known Allergies     Medication List       Accurate as of February 24, 2020  8:44 AM. If you have any questions, ask your nurse or doctor.        STOP taking these medications   ciprofloxacin 500 MG tablet Commonly known as: Cipro Stopped by: Volanda Napoleon, MD   sulfamethoxazole-trimethoprim 800-160 MG tablet Commonly known as: BACTRIM DS Stopped by: Volanda Napoleon, MD     TAKE these medications   diphenoxylate-atropine 2.5-0.025 MG tablet Commonly known as: LOMOTIL Take 1-2 tablets by mouth 4 (four) times daily as needed for diarrhea or loose stools.   gabapentin 300 MG capsule Commonly known as: NEURONTIN Take 1 capsule (300 mg total) by mouth 3 (three) times daily.   hydrocortisone 25 MG suppository Commonly known as: ANUSOL-HC PLACE 1 SUPPOSITORY (25 MG TOTAL) RECTALLY 2 (TWO) TIMES DAILY. FOR 7 DAYS   lactulose 10 GM/15ML solution Commonly known as: CHRONULAC SMARTSIG:40 Milliliter(s) By  Mouth Morning-Evening   linaclotide 145 MCG Caps capsule Commonly known as: Linzess Take 1 capsule (145 mcg total) by mouth daily before breakfast.   lipase/protease/amylase 36000 UNITS Cpep capsule Commonly known as: Creon Take 2 capsules (72,000 Units total) by mouth 3 (three) times daily before meals.   oxyCODONE-acetaminophen 5-325 MG tablet Commonly known as: PERCOCET/ROXICET Take 1 tablet by mouth every 6 (six) hours as needed for severe pain.   pantoprazole 40 MG tablet Commonly known as: PROTONIX TAKE 1 TABLET BY MOUTH TWICE A DAY   potassium chloride SA 20 MEQ tablet Commonly known as: Klor-Con M20 Take 2 tablets (40 mEq total) by mouth daily.   tamsulosin 0.4 MG Caps capsule Commonly known as: FLOMAX Take 0.4 mg by mouth daily.   zolpidem 5 MG tablet Commonly known as: AMBIEN Take 1 tablet (5 mg total) by mouth at bedtime as needed for sleep.       Allergies: No Known Allergies  Past Medical History, Surgical history, Social history, and Family History were reviewed and updated.  Review of Systems: Review of Systems  Constitutional: Negative.   HENT: Negative.   Eyes: Negative.   Respiratory: Negative.   Cardiovascular: Negative.   Gastrointestinal: Negative.   Genitourinary: Negative.   Musculoskeletal: Negative.   Skin: Negative.   Neurological: Negative.   Endo/Heme/Allergies: Negative.   Psychiatric/Behavioral: Negative.      Physical Exam:  weight is 195 lb  4 oz (88.6 kg). His oral temperature is 97.3 F (36.3 C) (abnormal). His blood pressure is 98/79 and his pulse is 88. His respiration is 18 and oxygen saturation is 99%.   Wt Readings from Last 3 Encounters:  02/24/20 195 lb 4 oz (88.6 kg)  02/11/20 191 lb (86.6 kg)  02/03/20 193 lb (87.5 kg)    Physical Exam Vitals reviewed.  HENT:     Head: Normocephalic and atraumatic.  Eyes:     Pupils: Pupils are equal, round, and reactive to light.  Cardiovascular:     Rate and Rhythm:  Normal rate and regular rhythm.     Heart sounds: Normal heart sounds.  Pulmonary:     Effort: Pulmonary effort is normal.     Breath sounds: Normal breath sounds.  Abdominal:     General: Bowel sounds are normal.     Palpations: Abdomen is soft.  Musculoskeletal:        General: No tenderness or deformity. Normal range of motion.     Cervical back: Normal range of motion.  Lymphadenopathy:     Cervical: No cervical adenopathy.  Skin:    General: Skin is warm and dry.     Findings: No erythema or rash.  Neurological:     Mental Status: He is alert and oriented to person, place, and time.  Psychiatric:        Behavior: Behavior normal.        Thought Content: Thought content normal.        Judgment: Judgment normal.      Lab Results  Component Value Date   WBC 3.0 (L) 02/24/2020   HGB 9.9 (L) 02/24/2020   HCT 29.3 (L) 02/24/2020   MCV 108.5 (H) 02/24/2020   PLT 98 (L) 02/24/2020   Lab Results  Component Value Date   FERRITIN 1,046 (H) 12/02/2019   IRON 97 12/02/2019   TIBC 244 12/02/2019   UIBC 146 12/02/2019   IRONPCTSAT 40 12/02/2019   Lab Results  Component Value Date   RBC 2.70 (L) 02/24/2020   No results found for: KPAFRELGTCHN, LAMBDASER, KAPLAMBRATIO No results found for: IGGSERUM, IGA, IGMSERUM No results found for: TOTALPROTELP, ALBUMINELP, A1GS, A2GS, BETS, BETA2SER, GAMS, MSPIKE, SPEI   Chemistry      Component Value Date/Time   NA 136 02/24/2020 0800   K 3.9 02/24/2020 0800   CL 102 02/24/2020 0800   CO2 26 02/24/2020 0800   BUN 6 (L) 02/24/2020 0800   CREATININE 0.54 (L) 02/24/2020 0800      Component Value Date/Time   CALCIUM 9.2 02/24/2020 0800   ALKPHOS 207 (H) 02/24/2020 0800   AST 30 02/24/2020 0800   ALT 26 02/24/2020 0800   BILITOT 0.5 02/24/2020 0800       Impression and Plan:   Jeffrey Harris is a nice 64 year old white male with metastatic pancreatic cancer.  He had been on FOLFIRINOX and had responded nicely.  His CA 19-9 to  come down quite well.  However, recently it has been going back up.  We then proved progressive disease with the CT scan.  Again, I believe that Abraxane/Gemzar would be a very good option.  We will go ahead and get him started today.   We will have to watch out with his blood counts.  We may have to make a dosage adjustment.  I hate this for Mr. Flamenco.  He really has done everything we have asked him to do.  He has a great  attitude.  He has good support.  Hopefully, we will see a response again.  I am glad that he is not having progression in the liver.  This was definitely make things a little bit more difficult with respect to treatment response.   Volanda Napoleon, MD 6/23/20218:44 AM

## 2020-02-24 NOTE — Patient Instructions (Signed)

## 2020-02-25 ENCOUNTER — Other Ambulatory Visit: Payer: Self-pay | Admitting: Hematology & Oncology

## 2020-02-26 ENCOUNTER — Inpatient Hospital Stay: Payer: BC Managed Care – PPO

## 2020-02-26 LAB — CANCER ANTIGEN 19-9: CA 19-9: 2218 U/mL — ABNORMAL HIGH (ref 0–35)

## 2020-03-02 ENCOUNTER — Other Ambulatory Visit: Payer: Self-pay

## 2020-03-02 ENCOUNTER — Other Ambulatory Visit: Payer: Self-pay | Admitting: Family

## 2020-03-02 ENCOUNTER — Inpatient Hospital Stay: Payer: BC Managed Care – PPO

## 2020-03-02 ENCOUNTER — Other Ambulatory Visit: Payer: Self-pay | Admitting: *Deleted

## 2020-03-02 ENCOUNTER — Other Ambulatory Visit: Payer: Self-pay | Admitting: Hematology & Oncology

## 2020-03-02 DIAGNOSIS — D696 Thrombocytopenia, unspecified: Secondary | ICD-10-CM | POA: Diagnosis not present

## 2020-03-02 DIAGNOSIS — C787 Secondary malignant neoplasm of liver and intrahepatic bile duct: Secondary | ICD-10-CM

## 2020-03-02 DIAGNOSIS — R978 Other abnormal tumor markers: Secondary | ICD-10-CM | POA: Diagnosis not present

## 2020-03-02 DIAGNOSIS — C259 Malignant neoplasm of pancreas, unspecified: Secondary | ICD-10-CM

## 2020-03-02 DIAGNOSIS — D649 Anemia, unspecified: Secondary | ICD-10-CM

## 2020-03-02 DIAGNOSIS — C250 Malignant neoplasm of head of pancreas: Secondary | ICD-10-CM | POA: Diagnosis not present

## 2020-03-02 DIAGNOSIS — Z5111 Encounter for antineoplastic chemotherapy: Secondary | ICD-10-CM | POA: Diagnosis not present

## 2020-03-02 DIAGNOSIS — R1011 Right upper quadrant pain: Secondary | ICD-10-CM | POA: Diagnosis not present

## 2020-03-02 LAB — CMP (CANCER CENTER ONLY)
ALT: 22 U/L (ref 0–44)
AST: 34 U/L (ref 15–41)
Albumin: 3.5 g/dL (ref 3.5–5.0)
Alkaline Phosphatase: 189 U/L — ABNORMAL HIGH (ref 38–126)
Anion gap: 7 (ref 5–15)
BUN: 7 mg/dL — ABNORMAL LOW (ref 8–23)
CO2: 24 mmol/L (ref 22–32)
Calcium: 8.8 mg/dL — ABNORMAL LOW (ref 8.9–10.3)
Chloride: 99 mmol/L (ref 98–111)
Creatinine: 0.5 mg/dL — ABNORMAL LOW (ref 0.61–1.24)
GFR, Est AFR Am: >60 mL/min (ref >60)
GFR, Estimated: >60 mL/min (ref >60)
Glucose, Bld: 124 mg/dL — ABNORMAL HIGH (ref 70–99)
Potassium: 3.9 mmol/L (ref 3.5–5.1)
Sodium: 130 mmol/L — ABNORMAL LOW (ref 135–145)
Total Bilirubin: 0.4 mg/dL (ref 0.3–1.2)
Total Protein: 6.9 g/dL (ref 6.5–8.1)

## 2020-03-02 LAB — CBC WITH DIFFERENTIAL (CANCER CENTER ONLY)
Abs Immature Granulocytes: 0.01 10*3/uL (ref 0.00–0.07)
Basophils Absolute: 0 10*3/uL (ref 0.0–0.1)
Basophils Relative: 0 %
Eosinophils Absolute: 0 10*3/uL (ref 0.0–0.5)
Eosinophils Relative: 0 %
HCT: 23.5 % — ABNORMAL LOW (ref 39.0–52.0)
Hemoglobin: 8 g/dL — ABNORMAL LOW (ref 13.0–17.0)
Immature Granulocytes: 0 %
Lymphocytes Relative: 39 %
Lymphs Abs: 0.9 10*3/uL (ref 0.7–4.0)
MCH: 36 pg — ABNORMAL HIGH (ref 26.0–34.0)
MCHC: 34 g/dL (ref 30.0–36.0)
MCV: 105.9 fL — ABNORMAL HIGH (ref 80.0–100.0)
Monocytes Absolute: 0.2 10*3/uL (ref 0.1–1.0)
Monocytes Relative: 7 %
Neutro Abs: 1.2 10*3/uL — ABNORMAL LOW (ref 1.7–7.7)
Neutrophils Relative %: 54 %
Platelet Count: 41 10*3/uL — ABNORMAL LOW (ref 150–400)
RBC: 2.22 MIL/uL — ABNORMAL LOW (ref 4.22–5.81)
RDW: 13.7 % (ref 11.5–15.5)
WBC Count: 2.3 10*3/uL — ABNORMAL LOW (ref 4.0–10.5)
nRBC: 0 % (ref 0.0–0.2)

## 2020-03-02 LAB — ABO/RH: ABO/RH(D): A NEG

## 2020-03-02 LAB — PREPARE RBC (CROSSMATCH)

## 2020-03-02 MED ORDER — HEPARIN SOD (PORK) LOCK FLUSH 100 UNIT/ML IV SOLN
500.0000 [IU] | Freq: Once | INTRAVENOUS | Status: AC
  Administered 2020-03-02: 500 [IU] via INTRAVENOUS
  Filled 2020-03-02: qty 5

## 2020-03-02 MED ORDER — SODIUM CHLORIDE 0.9% FLUSH
10.0000 mL | Freq: Once | INTRAVENOUS | Status: AC
  Administered 2020-03-02: 10 mL via INTRAVENOUS
  Filled 2020-03-02: qty 10

## 2020-03-02 NOTE — Patient Instructions (Signed)
Pancytopenia Pancytopenia is a condition in which a person has an abnormally low amount (deficiency) of the following blood cells:  Red blood cells (RBCs). Having too few RBCs is called anemia.  White blood cells (WBCs). Having too few WBCs is called leukopenia.  Cells that help the blood clot (platelets). Having too few platelets is called thrombocytopenia. Cells that become blood cells (stem cells) are made in the soft tissue inside the bones (bone marrow). All blood cells have a limited lifespan. Blood cells are constantly replaced with new blood cells from the bone marrow. Pancytopenia can be caused by any condition or disease that:  Destroys the ability of bone marrow to make blood cells.  Causes bone marrow to make blood cells that cannot survive after they leave the bone marrow. What are the causes? There are many possible causes of this condition. In some cases, the cause is not known. Common causes of the condition include:  A disease that causes bone marrow to make immature blood cells (megaloblastic anemia).  A blood disorder that makes bone marrow unable to produce enough new RBCs (aplastic anemia or bone marrow failure).  An enlarged spleen (hypersplenism). An enlarged spleen can trap blood cells and destroy them faster than they can be replaced.  Inherited diseases of the blood or bone marrow.  Cancers that affect bone marrow.  Certain medicines, such as: ? Chemotherapy. ? Medicines that reduce the activity of the immune system (immunosuppressant medicines).  Exposure to radiation.  Severe infections. What increases the risk? You are more likely to develop this condition if:  You are 30?62 years old.  You are male.  You have a family history of a blood or bone marrow disease.  You have certain conditions, such as: ? Alcohol use disorder. ? HIV (human immunodeficiency virus) or AIDS (acquired immunodeficiency syndrome). ? Cancer. ? Conditions in which the  body's disease-fighting system attacks normal tissues (autoimmune diseases). What are the signs or symptoms? Symptoms of this condition vary depending on the cause and may include:  Anemia.  Weakness.  Shortness of breath.  Unusual bruising and bleeding.  Frequent infections.  Fatigue.  Fever.  Pale skin.  Bone pain.  Night sweats.  Weight loss.  Headache.  Dizziness.  Feeling unusually cold. How is this diagnosed? This condition may be diagnosed based on:  Your symptoms.  Your medical history.  A physical exam.  Tests. These may include: ? Removal of a sample of bone marrow to be examined under a microscope (biopsy). This is done by inserting a needle into a bone to remove fluid and cells (aspiration). ? A complete blood count (CBC). This is a group of tests that measures characteristics of WBCs, RBCs, and platelets. ? A peripheral blood smear. This test examines your blood under a microscope to provide information about drugs and diseases that affect RBCs, WBCs, and platelets. ? Reticulocyte count. This is a test that measures the amount of new or immature RBCs (reticulocytes) that are made by your bone marrow. ? Imaging studies of your spleen or liver, such as X-rays. ? A test to measure your vitamin B12 level. ? Tests for viruses. How is this treated? Treatment for this condition depends on the cause. Treatment may include:  Immunosuppressant medicines.  Antibiotic medicine.  Vitamin B12. This may be given as a treatment for megaloblastic anemia.  Medicines that help the bone marrow make blood cells (bone marrow stimulating drugs).  A bone marrow transplant.  Receiving donated blood through an IV (  blood transfusion).  A procedure to remove your spleen (splenectomy). This may be done as a treatment for hypersplenism. Follow these instructions at home: Caring for your body      Wash your hands often with soap and water. If soap and water are not  available, use hand sanitizer.  Brush your teeth twice a day, and floss at least once a day. It is recommended that you visit the dentist every 6 months.  Stay up to date on your vaccinations, including a yearly (annual) flu shot. Ask your health care provider which vaccines you should get. These may include a pneumonia vaccine. Medicines  Take over-the-counter and prescription medicines only as told by your health care provider.  If you were prescribed an antibiotic medicine, take it as told by your health care provider. Do not stop taking the antibiotic even if you start to feel better. Lifestyle  Do not participate in contact sports or dangerous activities. Ask your health care provider what activities are safe for you.  During cold and flu season, avoid crowded places and avoid contact with people who are sick. Flu season is typically between the months of October and May. General instructions  Work with your health care provider to manage your condition and educate yourself about your condition.  Follow food safety recommendations as told by your health care provider.  Keep all follow-up visits as told by your health care provider. This is important. Contact a health care provider if you:  Have a fever.  Bruise or bleed easily.  Are dizzy.  Feel unusually weak or tired. Get help right away if you have:  Bleeding that does not stop.  Wheezing or shortness of breath.  Chest pain. These symptoms may represent a serious problem that is an emergency. Do not wait to see if the symptoms will go away. Get medical help right away. Call your local emergency services (911 in the [place] Do not drive yourself to the hospital. Summary  Pancytopenia is a condition in which a person has an abnormally low amount of red blood cells, white blood cells, and platelets.  There are many possible causes of this condition.  Treatment for this condition depends on the cause.  Stay up to  date on your vaccinations.  Do not participate in contact sports or dangerous activities. Ask your health care provider what activities are safe for you. This information is not intended to replace advice given to you by your health care provider. Make sure you discuss any questions you have with your health care provider. Document Revised: 05/26/2018 Document Reviewed: 05/26/2018 Elsevier Patient Education  2020 [employer]   

## 2020-03-02 NOTE — Progress Notes (Signed)
Reviewed pt labs with Dr. Marin Olp (CBC) and pt not to receive treatment today. Pt to receive one unit PRBC on 03/03/2020. Pt to come back next week for treatment. Pt verbalized being frustrated with delay in treatment. This RN acknowledged pt frustration and educated pt on neutropenic precautions and pancytopenia. Pt educated that if he wants to plan family trips he can and this office will work his appointments around his trips. Pt verbalized understanding and had no further questions. Pt aware to return for transfusion tomorrow. Pt aware to call clinic with any questions or concerns.

## 2020-03-03 ENCOUNTER — Telehealth: Payer: Self-pay | Admitting: *Deleted

## 2020-03-03 ENCOUNTER — Inpatient Hospital Stay: Payer: BC Managed Care – PPO | Attending: Hematology & Oncology

## 2020-03-03 ENCOUNTER — Other Ambulatory Visit: Payer: Self-pay | Admitting: Hematology & Oncology

## 2020-03-03 VITALS — BP 114/76 | HR 61 | Temp 97.3°F | Resp 18

## 2020-03-03 DIAGNOSIS — G629 Polyneuropathy, unspecified: Secondary | ICD-10-CM | POA: Diagnosis not present

## 2020-03-03 DIAGNOSIS — C250 Malignant neoplasm of head of pancreas: Secondary | ICD-10-CM | POA: Insufficient documentation

## 2020-03-03 DIAGNOSIS — Z5111 Encounter for antineoplastic chemotherapy: Secondary | ICD-10-CM | POA: Insufficient documentation

## 2020-03-03 DIAGNOSIS — Z95828 Presence of other vascular implants and grafts: Secondary | ICD-10-CM

## 2020-03-03 DIAGNOSIS — G893 Neoplasm related pain (acute) (chronic): Secondary | ICD-10-CM | POA: Diagnosis not present

## 2020-03-03 DIAGNOSIS — C787 Secondary malignant neoplasm of liver and intrahepatic bile duct: Secondary | ICD-10-CM | POA: Insufficient documentation

## 2020-03-03 DIAGNOSIS — R5383 Other fatigue: Secondary | ICD-10-CM | POA: Diagnosis not present

## 2020-03-03 DIAGNOSIS — Z79899 Other long term (current) drug therapy: Secondary | ICD-10-CM | POA: Diagnosis not present

## 2020-03-03 DIAGNOSIS — D649 Anemia, unspecified: Secondary | ICD-10-CM

## 2020-03-03 LAB — CANCER ANTIGEN 19-9: CA 19-9: 1511 U/mL — ABNORMAL HIGH (ref 0–35)

## 2020-03-03 MED ORDER — SODIUM CHLORIDE 0.9% FLUSH
10.0000 mL | INTRAVENOUS | Status: DC | PRN
  Administered 2020-03-03: 10 mL via INTRAVENOUS
  Filled 2020-03-03: qty 10

## 2020-03-03 MED ORDER — DIPHENHYDRAMINE HCL 25 MG PO CAPS
25.0000 mg | ORAL_CAPSULE | Freq: Once | ORAL | Status: AC
  Administered 2020-03-03: 25 mg via ORAL

## 2020-03-03 MED ORDER — ACETAMINOPHEN 325 MG PO TABS
650.0000 mg | ORAL_TABLET | Freq: Once | ORAL | Status: AC
  Administered 2020-03-03: 650 mg via ORAL

## 2020-03-03 MED ORDER — HEPARIN SOD (PORK) LOCK FLUSH 100 UNIT/ML IV SOLN
500.0000 [IU] | Freq: Once | INTRAVENOUS | Status: AC
  Administered 2020-03-03: 500 [IU] via INTRAVENOUS
  Filled 2020-03-03: qty 5

## 2020-03-03 MED ORDER — DIPHENHYDRAMINE HCL 25 MG PO CAPS
ORAL_CAPSULE | ORAL | Status: AC
  Filled 2020-03-03: qty 1

## 2020-03-03 MED ORDER — ACETAMINOPHEN 325 MG PO TABS
ORAL_TABLET | ORAL | Status: AC
  Filled 2020-03-03: qty 2

## 2020-03-03 NOTE — Telephone Encounter (Signed)
As noted below by Dr. Marin Olp, I informed the patient that the tumor marker is down to 1500. He verbalized understanding.

## 2020-03-03 NOTE — Patient Instructions (Signed)

## 2020-03-03 NOTE — Telephone Encounter (Signed)
-----   Message from Volanda Napoleon, MD sent at 03/03/2020 12:20 PM EDT ----- Call - the tumor marker is now down to 1500!!  Great job.  Laurey Arrow

## 2020-03-04 LAB — TYPE AND SCREEN
ABO/RH(D): A NEG
Antibody Screen: NEGATIVE
Unit division: 0

## 2020-03-04 LAB — BPAM RBC
Blood Product Expiration Date: 202107092359
ISSUE DATE / TIME: 202107010757
Unit Type and Rh: 600

## 2020-03-09 ENCOUNTER — Other Ambulatory Visit: Payer: Self-pay

## 2020-03-09 ENCOUNTER — Inpatient Hospital Stay: Payer: BC Managed Care – PPO

## 2020-03-09 ENCOUNTER — Other Ambulatory Visit: Payer: Self-pay | Admitting: *Deleted

## 2020-03-09 DIAGNOSIS — R5383 Other fatigue: Secondary | ICD-10-CM | POA: Diagnosis not present

## 2020-03-09 DIAGNOSIS — C250 Malignant neoplasm of head of pancreas: Secondary | ICD-10-CM | POA: Diagnosis not present

## 2020-03-09 DIAGNOSIS — C787 Secondary malignant neoplasm of liver and intrahepatic bile duct: Secondary | ICD-10-CM | POA: Diagnosis not present

## 2020-03-09 DIAGNOSIS — G629 Polyneuropathy, unspecified: Secondary | ICD-10-CM | POA: Diagnosis not present

## 2020-03-09 DIAGNOSIS — Z79899 Other long term (current) drug therapy: Secondary | ICD-10-CM | POA: Diagnosis not present

## 2020-03-09 DIAGNOSIS — Z5111 Encounter for antineoplastic chemotherapy: Secondary | ICD-10-CM | POA: Diagnosis not present

## 2020-03-09 DIAGNOSIS — G893 Neoplasm related pain (acute) (chronic): Secondary | ICD-10-CM | POA: Diagnosis not present

## 2020-03-09 LAB — CBC WITH DIFFERENTIAL (CANCER CENTER ONLY)
Abs Immature Granulocytes: 0 10*3/uL (ref 0.00–0.07)
Basophils Absolute: 0 10*3/uL (ref 0.0–0.1)
Basophils Relative: 1 %
Eosinophils Absolute: 0 10*3/uL (ref 0.0–0.5)
Eosinophils Relative: 1 %
HCT: 27.9 % — ABNORMAL LOW (ref 39.0–52.0)
Hemoglobin: 9.5 g/dL — ABNORMAL LOW (ref 13.0–17.0)
Immature Granulocytes: 0 %
Lymphocytes Relative: 45 %
Lymphs Abs: 0.8 10*3/uL (ref 0.7–4.0)
MCH: 35.7 pg — ABNORMAL HIGH (ref 26.0–34.0)
MCHC: 34.1 g/dL (ref 30.0–36.0)
MCV: 104.9 fL — ABNORMAL HIGH (ref 80.0–100.0)
Monocytes Absolute: 0.3 10*3/uL (ref 0.1–1.0)
Monocytes Relative: 17 %
Neutro Abs: 0.6 10*3/uL — ABNORMAL LOW (ref 1.7–7.7)
Neutrophils Relative %: 36 %
Platelet Count: 106 10*3/uL — ABNORMAL LOW (ref 150–400)
RBC: 2.66 MIL/uL — ABNORMAL LOW (ref 4.22–5.81)
RDW: 15 % (ref 11.5–15.5)
WBC Count: 1.7 10*3/uL — ABNORMAL LOW (ref 4.0–10.5)
nRBC: 0 % (ref 0.0–0.2)

## 2020-03-09 LAB — CMP (CANCER CENTER ONLY)
ALT: 22 U/L (ref 0–44)
AST: 30 U/L (ref 15–41)
Albumin: 3.7 g/dL (ref 3.5–5.0)
Alkaline Phosphatase: 180 U/L — ABNORMAL HIGH (ref 38–126)
Anion gap: 5 (ref 5–15)
BUN: <5 mg/dL — ABNORMAL LOW (ref 8–23)
CO2: 25 mmol/L (ref 22–32)
Calcium: 9 mg/dL (ref 8.9–10.3)
Chloride: 102 mmol/L (ref 98–111)
Creatinine: 0.49 mg/dL — ABNORMAL LOW (ref 0.61–1.24)
GFR, Est AFR Am: >60 mL/min (ref >60)
GFR, Estimated: >60 mL/min (ref >60)
Glucose, Bld: 145 mg/dL — ABNORMAL HIGH (ref 70–99)
Potassium: 4 mmol/L (ref 3.5–5.1)
Sodium: 132 mmol/L — ABNORMAL LOW (ref 135–145)
Total Bilirubin: 0.3 mg/dL (ref 0.3–1.2)
Total Protein: 6.7 g/dL (ref 6.5–8.1)

## 2020-03-09 MED ORDER — PROCHLORPERAZINE MALEATE 10 MG PO TABS
ORAL_TABLET | ORAL | Status: AC
  Filled 2020-03-09: qty 1

## 2020-03-09 MED ORDER — DIPHENOXYLATE-ATROPINE 2.5-0.025 MG PO TABS: 1.0000 | ORAL_TABLET | Freq: Four times a day (QID) | ORAL | 0 refills | Status: DC | PRN

## 2020-03-09 MED ORDER — PACLITAXEL PROTEIN-BOUND CHEMO INJECTION 100 MG
112.5000 mg/m2 | Freq: Once | INTRAVENOUS | Status: AC
  Administered 2020-03-09: 225 mg via INTRAVENOUS
  Filled 2020-03-09: qty 45

## 2020-03-09 MED ORDER — SODIUM CHLORIDE 0.9 % IV SOLN
Freq: Once | INTRAVENOUS | Status: AC
  Filled 2020-03-09: qty 250

## 2020-03-09 MED ORDER — SODIUM CHLORIDE 0.9 % IV SOLN
900.0000 mg/m2 | Freq: Once | INTRAVENOUS | Status: AC
  Administered 2020-03-09: 1862 mg via INTRAVENOUS
  Filled 2020-03-09: qty 48.97

## 2020-03-09 MED ORDER — SODIUM CHLORIDE 0.9% FLUSH
10.0000 mL | INTRAVENOUS | Status: DC | PRN
  Administered 2020-03-09: 10 mL
  Filled 2020-03-09: qty 10

## 2020-03-09 MED ORDER — PROCHLORPERAZINE MALEATE 10 MG PO TABS
10.0000 mg | ORAL_TABLET | Freq: Once | ORAL | Status: AC
  Administered 2020-03-09: 10 mg via ORAL

## 2020-03-09 MED ORDER — HEPARIN SOD (PORK) LOCK FLUSH 100 UNIT/ML IV SOLN
500.0000 [IU] | Freq: Once | INTRAVENOUS | Status: AC | PRN
  Administered 2020-03-09: 500 [IU]
  Filled 2020-03-09: qty 5

## 2020-03-09 NOTE — Patient Instructions (Signed)
Nanoparticle Albumin-Bound Paclitaxel injection What is this medicine? NANOPARTICLE ALBUMIN-BOUND PACLITAXEL (Na no PAHR ti kuhl al BYOO muhn-bound PAK li TAX el) is a chemotherapy drug. It targets fast dividing cells, like cancer cells, and causes these cells to die. This medicine is used to treat advanced breast cancer, lung cancer, and pancreatic cancer. This medicine may be used for other purposes; ask your health care provider or pharmacist if you have questions. COMMON BRAND NAME(S): Abraxane What should I tell my health care provider before I take this medicine? They need to know if you have any of these conditions:  kidney disease  liver disease  low blood counts, like low white cell, platelet, or red cell counts  lung or breathing disease, like asthma  tingling of the fingers or toes, or other nerve disorder  an unusual or allergic reaction to paclitaxel, albumin, other chemotherapy, other medicines, foods, dyes, or preservatives  pregnant or trying to get pregnant  breast-feeding How should I use this medicine? This drug is given as an infusion into a vein. It is administered in a hospital or clinic by a specially trained health care professional. Talk to your pediatrician regarding the use of this medicine in children. Special care may be needed. Overdosage: If you think you have taken too much of this medicine contact a poison control center or emergency room at once. NOTE: This medicine is only for you. Do not share this medicine with others. What if I miss a dose? It is important not to miss your dose. Call your doctor or health care professional if you are unable to keep an appointment. What may interact with this medicine? This medicine may interact with the following medications:  antiviral medicines for hepatitis, HIV or AIDS  certain antibiotics like erythromycin and clarithromycin  certain medicines for fungal infections like ketoconazole and  itraconazole  certain medicines for seizures like carbamazepine, phenobarbital, phenytoin  gemfibrozil  nefazodone  rifampin  St. John's wort This list may not describe all possible interactions. Give your health care provider a list of all the medicines, herbs, non-prescription drugs, or dietary supplements you use. Also tell them if you smoke, drink alcohol, or use illegal drugs. Some items may interact with your medicine. What should I watch for while using this medicine? Your condition will be monitored carefully while you are receiving this medicine. You will need important blood work done while you are taking this medicine. This medicine can cause serious allergic reactions. If you experience allergic reactions like skin rash, itching or hives, swelling of the face, lips, or tongue, tell your doctor or health care professional right away. In some cases, you may be given additional medicines to help with side effects. Follow all directions for their use. This drug may make you feel generally unwell. This is not uncommon, as chemotherapy can affect healthy cells as well as cancer cells. Report any side effects. Continue your course of treatment even though you feel ill unless your doctor tells you to stop. Call your doctor or health care professional for advice if you get a fever, chills or sore throat, or other symptoms of a cold or flu. Do not treat yourself. This drug decreases your body's ability to fight infections. Try to avoid being around people who are sick. This medicine may increase your risk to bruise or bleed. Call your doctor or health care professional if you notice any unusual bleeding. Be careful brushing and flossing your teeth or using a toothpick because you may   get an infection or bleed more easily. If you have any dental work done, tell your dentist you are receiving this medicine. Avoid taking products that contain aspirin, acetaminophen, ibuprofen, naproxen, or  ketoprofen unless instructed by your doctor. These medicines may hide a fever. Do not become pregnant while taking this medicine or for 6 months after stopping it. Women should inform their doctor if they wish to become pregnant or think they might be pregnant. Men should not father a child while taking this medicine or for 3 months after stopping it. There is a potential for serious side effects to an unborn child. Talk to your health care professional or pharmacist for more information. Do not breast-feed an infant while taking this medicine or for 2 weeks after stopping it. This medicine may interfere with the ability to get pregnant or to father a child. You should talk to your doctor or health care professional if you are concerned about your fertility. What side effects may I notice from receiving this medicine? Side effects that you should report to your doctor or health care professional as soon as possible:  allergic reactions like skin rash, itching or hives, swelling of the face, lips, or tongue  breathing problems  changes in vision  fast, irregular heartbeat  low blood pressure  mouth sores  pain, tingling, numbness in the hands or feet  signs of decreased platelets or bleeding - bruising, pinpoint red spots on the skin, black, tarry stools, blood in the urine  signs of decreased red blood cells - unusually weak or tired, feeling faint or lightheaded, falls  signs of infection - fever or chills, cough, sore throat, pain or difficulty passing urine  signs and symptoms of liver injury like dark yellow or brown urine; general ill feeling or flu-like symptoms; light-colored stools; loss of appetite; nausea; right upper belly pain; unusually weak or tired; yellowing of the eyes or skin  swelling of the ankles, feet, hands  unusually slow heartbeat Side effects that usually do not require medical attention (report to your doctor or health care professional if they continue or  are bothersome):  diarrhea  hair loss  loss of appetite  nausea, vomiting  tiredness This list may not describe all possible side effects. Call your doctor for medical advice about side effects. You may report side effects to FDA at 1-800-FDA-1088. Where should I keep my medicine? This drug is given in a hospital or clinic and will not be stored at home. NOTE: This sheet is a summary. It may not cover all possible information. If you have questions about this medicine, talk to your doctor, pharmacist, or health care provider.  2020 Elsevier/Gold Standard (2017-04-23 13:03:45)  Gemcitabine injection What is this medicine? GEMCITABINE (jem SYE ta been) is a chemotherapy drug. This medicine is used to treat many types of cancer like breast cancer, lung cancer, pancreatic cancer, and ovarian cancer. This medicine may be used for other purposes; ask your health care provider or pharmacist if you have questions. COMMON BRAND NAME(S): Gemzar, Infugem What should I tell my health care provider before I take this medicine? They need to know if you have any of these conditions:  blood disorders  infection  kidney disease  liver disease  lung or breathing disease, like asthma  recent or ongoing radiation therapy  an unusual or allergic reaction to gemcitabine, other chemotherapy, other medicines, foods, dyes, or preservatives  pregnant or trying to get pregnant  breast-feeding How should I use this   medicine? This drug is given as an infusion into a vein. It is administered in a hospital or clinic by a specially trained health care professional. Talk to your pediatrician regarding the use of this medicine in children. Special care may be needed. Overdosage: If you think you have taken too much of this medicine contact a poison control center or emergency room at once. NOTE: This medicine is only for you. Do not share this medicine with others. What if I miss a dose? It is  important not to miss your dose. Call your doctor or health care professional if you are unable to keep an appointment. What may interact with this medicine?  medicines to increase blood counts like filgrastim, pegfilgrastim, sargramostim  some other chemotherapy drugs like cisplatin  vaccines Talk to your doctor or health care professional before taking any of these medicines:  acetaminophen  aspirin  ibuprofen  ketoprofen  naproxen This list may not describe all possible interactions. Give your health care provider a list of all the medicines, herbs, non-prescription drugs, or dietary supplements you use. Also tell them if you smoke, drink alcohol, or use illegal drugs. Some items may interact with your medicine. What should I watch for while using this medicine? Visit your doctor for checks on your progress. This drug may make you feel generally unwell. This is not uncommon, as chemotherapy can affect healthy cells as well as cancer cells. Report any side effects. Continue your course of treatment even though you feel ill unless your doctor tells you to stop. In some cases, you may be given additional medicines to help with side effects. Follow all directions for their use. Call your doctor or health care professional for advice if you get a fever, chills or sore throat, or other symptoms of a cold or flu. Do not treat yourself. This drug decreases your body's ability to fight infections. Try to avoid being around people who are sick. This medicine may increase your risk to bruise or bleed. Call your doctor or health care professional if you notice any unusual bleeding. Be careful brushing and flossing your teeth or using a toothpick because you may get an infection or bleed more easily. If you have any dental work done, tell your dentist you are receiving this medicine. Avoid taking products that contain aspirin, acetaminophen, ibuprofen, naproxen, or ketoprofen unless instructed by  your doctor. These medicines may hide a fever. Do not become pregnant while taking this medicine or for 6 months after stopping it. Women should inform their doctor if they wish to become pregnant or think they might be pregnant. Men should not father a child while taking this medicine and for 3 months after stopping it. There is a potential for serious side effects to an unborn child. Talk to your health care professional or pharmacist for more information. Do not breast-feed an infant while taking this medicine or for at least 1 week after stopping it. Men should inform their doctors if they wish to father a child. This medicine may lower sperm counts. Talk with your doctor or health care professional if you are concerned about your fertility. What side effects may I notice from receiving this medicine? Side effects that you should report to your doctor or health care professional as soon as possible:  allergic reactions like skin rash, itching or hives, swelling of the face, lips, or tongue  breathing problems  pain, redness, or irritation at site where injected  signs and symptoms of a dangerous   change in heartbeat or heart rhythm like chest pain; dizziness; fast or irregular heartbeat; palpitations; feeling faint or lightheaded, falls; breathing problems  signs of decreased platelets or bleeding - bruising, pinpoint red spots on the skin, black, tarry stools, blood in the urine  signs of decreased red blood cells - unusually weak or tired, feeling faint or lightheaded, falls  signs of infection - fever or chills, cough, sore throat, pain or difficulty passing urine  signs and symptoms of kidney injury like trouble passing urine or change in the amount of urine  signs and symptoms of liver injury like dark yellow or brown urine; general ill feeling or flu-like symptoms; light-colored stools; loss of appetite; nausea; right upper belly pain; unusually weak or tired; yellowing of the eyes or  skin  swelling of ankles, feet, hands Side effects that usually do not require medical attention (report to your doctor or health care professional if they continue or are bothersome):  constipation  diarrhea  hair loss  loss of appetite  nausea  rash  vomiting This list may not describe all possible side effects. Call your doctor for medical advice about side effects. You may report side effects to FDA at 1-800-FDA-1088. Where should I keep my medicine? This drug is given in a hospital or clinic and will not be stored at home. NOTE: This sheet is a summary. It may not cover all possible information. If you have questions about this medicine, talk to your doctor, pharmacist, or health care provider.  2020 Elsevier/Gold Standard (2017-11-13 18:06:11)   

## 2020-03-09 NOTE — Progress Notes (Signed)
Per Dr Marin Olp ok to treat with neutropenia. Will all Neupogen or similar. dph

## 2020-03-09 NOTE — Progress Notes (Signed)
Dr. Marin Olp would like to change this patient's treatment schedule to days 1 and 8 every 3 weeks. Orders changed per his instructions.

## 2020-03-09 NOTE — Patient Instructions (Signed)

## 2020-03-10 ENCOUNTER — Encounter: Payer: Self-pay | Admitting: *Deleted

## 2020-03-10 ENCOUNTER — Inpatient Hospital Stay: Payer: BC Managed Care – PPO

## 2020-03-10 ENCOUNTER — Other Ambulatory Visit: Payer: Self-pay | Admitting: Hematology & Oncology

## 2020-03-10 VITALS — BP 106/72 | HR 92 | Temp 98.6°F | Resp 16

## 2020-03-10 DIAGNOSIS — Z79899 Other long term (current) drug therapy: Secondary | ICD-10-CM | POA: Diagnosis not present

## 2020-03-10 DIAGNOSIS — Z5111 Encounter for antineoplastic chemotherapy: Secondary | ICD-10-CM | POA: Diagnosis not present

## 2020-03-10 DIAGNOSIS — C250 Malignant neoplasm of head of pancreas: Secondary | ICD-10-CM | POA: Diagnosis not present

## 2020-03-10 DIAGNOSIS — C787 Secondary malignant neoplasm of liver and intrahepatic bile duct: Secondary | ICD-10-CM | POA: Diagnosis not present

## 2020-03-10 DIAGNOSIS — D702 Other drug-induced agranulocytosis: Secondary | ICD-10-CM | POA: Insufficient documentation

## 2020-03-10 DIAGNOSIS — R5383 Other fatigue: Secondary | ICD-10-CM | POA: Diagnosis not present

## 2020-03-10 DIAGNOSIS — G893 Neoplasm related pain (acute) (chronic): Secondary | ICD-10-CM | POA: Diagnosis not present

## 2020-03-10 DIAGNOSIS — G629 Polyneuropathy, unspecified: Secondary | ICD-10-CM | POA: Diagnosis not present

## 2020-03-10 MED ORDER — FILGRASTIM-SNDZ 480 MCG/0.8ML IJ SOSY
480.0000 ug | PREFILLED_SYRINGE | Freq: Once | INTRAMUSCULAR | Status: AC
  Administered 2020-03-10: 480 ug via SUBCUTANEOUS
  Filled 2020-03-10: qty 0.8

## 2020-03-10 MED ORDER — TBO-FILGRASTIM 480 MCG/0.8ML ~~LOC~~ SOSY: 480.0000 ug | PREFILLED_SYRINGE | Freq: Once | SUBCUTANEOUS | Status: DC

## 2020-03-10 NOTE — Progress Notes (Unsigned)
neupogen

## 2020-03-10 NOTE — Patient Instructions (Signed)

## 2020-03-11 ENCOUNTER — Encounter: Payer: Self-pay | Admitting: Family

## 2020-03-11 ENCOUNTER — Encounter: Payer: Self-pay | Admitting: Hematology & Oncology

## 2020-03-15 ENCOUNTER — Telehealth: Payer: Self-pay | Admitting: Nutrition

## 2020-03-15 ENCOUNTER — Inpatient Hospital Stay: Payer: BC Managed Care – PPO | Admitting: Nutrition

## 2020-03-15 NOTE — Telephone Encounter (Signed)
Nutrition follow up completed by phone. 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 Marin Olp. Reports wt is stable at 195 pounds on June 23. States he weighs at home and monitors trends. He is reporting a good appetite but states taste alterations limit oral intake. Continues oral nutrition supplements to add calories and protein if he is not eating as well. Reviewed labs.  Nutrition diagnosis: Unintended weight loss has stabilized.  Intervention: Educated patient on strategies to enhance flavors of food.  Recommended baking soda and salt water rinses. Encouraged additional seasonings to enhance flavors. Continue oral nutrition supplements as needed. Will mail fact sheet on taste alterations.  Monitoring, Evaluation, Goals: Will monitor weights for weight maintenance   Next Visit: To be scheduled as needed.

## 2020-03-22 ENCOUNTER — Other Ambulatory Visit: Payer: Self-pay | Admitting: Family

## 2020-03-22 DIAGNOSIS — D649 Anemia, unspecified: Secondary | ICD-10-CM

## 2020-03-22 DIAGNOSIS — D508 Other iron deficiency anemias: Secondary | ICD-10-CM

## 2020-03-22 DIAGNOSIS — C787 Secondary malignant neoplasm of liver and intrahepatic bile duct: Secondary | ICD-10-CM

## 2020-03-22 DIAGNOSIS — C259 Malignant neoplasm of pancreas, unspecified: Secondary | ICD-10-CM

## 2020-03-23 ENCOUNTER — Inpatient Hospital Stay: Payer: BC Managed Care – PPO

## 2020-03-23 ENCOUNTER — Other Ambulatory Visit: Payer: Self-pay

## 2020-03-23 ENCOUNTER — Encounter: Payer: Self-pay | Admitting: Family

## 2020-03-23 ENCOUNTER — Inpatient Hospital Stay (HOSPITAL_BASED_OUTPATIENT_CLINIC_OR_DEPARTMENT_OTHER): Payer: BC Managed Care – PPO | Admitting: Family

## 2020-03-23 VITALS — BP 111/66 | HR 78 | Temp 98.0°F | Resp 17 | Ht 71.0 in | Wt 193.1 lb

## 2020-03-23 DIAGNOSIS — G629 Polyneuropathy, unspecified: Secondary | ICD-10-CM

## 2020-03-23 DIAGNOSIS — D649 Anemia, unspecified: Secondary | ICD-10-CM

## 2020-03-23 DIAGNOSIS — C787 Secondary malignant neoplasm of liver and intrahepatic bile duct: Secondary | ICD-10-CM

## 2020-03-23 DIAGNOSIS — R5383 Other fatigue: Secondary | ICD-10-CM | POA: Diagnosis not present

## 2020-03-23 DIAGNOSIS — C259 Malignant neoplasm of pancreas, unspecified: Secondary | ICD-10-CM

## 2020-03-23 DIAGNOSIS — D508 Other iron deficiency anemias: Secondary | ICD-10-CM | POA: Diagnosis not present

## 2020-03-23 DIAGNOSIS — C250 Malignant neoplasm of head of pancreas: Secondary | ICD-10-CM | POA: Diagnosis not present

## 2020-03-23 DIAGNOSIS — Z5111 Encounter for antineoplastic chemotherapy: Secondary | ICD-10-CM | POA: Diagnosis not present

## 2020-03-23 DIAGNOSIS — G893 Neoplasm related pain (acute) (chronic): Secondary | ICD-10-CM | POA: Diagnosis not present

## 2020-03-23 DIAGNOSIS — Z79899 Other long term (current) drug therapy: Secondary | ICD-10-CM | POA: Diagnosis not present

## 2020-03-23 LAB — CMP (CANCER CENTER ONLY)
ALT: 24 U/L (ref 0–44)
AST: 35 U/L (ref 15–41)
Albumin: 3.8 g/dL (ref 3.5–5.0)
Alkaline Phosphatase: 190 U/L — ABNORMAL HIGH (ref 38–126)
Anion gap: 6 (ref 5–15)
BUN: 5 mg/dL — ABNORMAL LOW (ref 8–23)
CO2: 25 mmol/L (ref 22–32)
Calcium: 9.2 mg/dL (ref 8.9–10.3)
Chloride: 98 mmol/L (ref 98–111)
Creatinine: 0.46 mg/dL — ABNORMAL LOW (ref 0.61–1.24)
GFR, Est AFR Am: >60 mL/min (ref >60)
GFR, Estimated: >60 mL/min (ref >60)
Glucose, Bld: 105 mg/dL — ABNORMAL HIGH (ref 70–99)
Potassium: 4.2 mmol/L (ref 3.5–5.1)
Sodium: 129 mmol/L — ABNORMAL LOW (ref 135–145)
Total Bilirubin: 0.4 mg/dL (ref 0.3–1.2)
Total Protein: 6.9 g/dL (ref 6.5–8.1)

## 2020-03-23 LAB — CBC WITH DIFFERENTIAL (CANCER CENTER ONLY)
Abs Immature Granulocytes: 0.03 10*3/uL (ref 0.00–0.07)
Basophils Absolute: 0 10*3/uL (ref 0.0–0.1)
Basophils Relative: 1 %
Eosinophils Absolute: 0 10*3/uL (ref 0.0–0.5)
Eosinophils Relative: 1 %
HCT: 28.3 % — ABNORMAL LOW (ref 39.0–52.0)
Hemoglobin: 9.8 g/dL — ABNORMAL LOW (ref 13.0–17.0)
Immature Granulocytes: 1 %
Lymphocytes Relative: 37 %
Lymphs Abs: 0.9 10*3/uL (ref 0.7–4.0)
MCH: 35.6 pg — ABNORMAL HIGH (ref 26.0–34.0)
MCHC: 34.6 g/dL (ref 30.0–36.0)
MCV: 102.9 fL — ABNORMAL HIGH (ref 80.0–100.0)
Monocytes Absolute: 0.5 10*3/uL (ref 0.1–1.0)
Monocytes Relative: 18 %
Neutro Abs: 1.1 10*3/uL — ABNORMAL LOW (ref 1.7–7.7)
Neutrophils Relative %: 42 %
Platelet Count: 157 10*3/uL (ref 150–400)
RBC: 2.75 MIL/uL — ABNORMAL LOW (ref 4.22–5.81)
RDW: 14.8 % (ref 11.5–15.5)
WBC Count: 2.5 10*3/uL — ABNORMAL LOW (ref 4.0–10.5)
nRBC: 0 % (ref 0.0–0.2)

## 2020-03-23 LAB — RETICULOCYTES
Immature Retic Fract: 14.1 % (ref 2.3–15.9)
RBC.: 2.72 MIL/uL — ABNORMAL LOW (ref 4.22–5.81)
Retic Count, Absolute: 77.8 10*3/uL (ref 19.0–186.0)
Retic Ct Pct: 2.9 % (ref 0.4–3.1)

## 2020-03-23 LAB — LACTATE DEHYDROGENASE: LDH: 220 U/L — ABNORMAL HIGH (ref 98–192)

## 2020-03-23 LAB — SAMPLE TO BLOOD BANK

## 2020-03-23 LAB — IRON AND TIBC
Iron: 59 ug/dL (ref 42–163)
Saturation Ratios: 24 % (ref 20–55)
TIBC: 248 ug/dL (ref 202–409)
UIBC: 188 ug/dL (ref 117–376)

## 2020-03-23 LAB — FERRITIN: Ferritin: 1258 ng/mL — ABNORMAL HIGH (ref 24–336)

## 2020-03-23 MED ORDER — GABAPENTIN 300 MG PO CAPS: 300.0000 mg | ORAL_CAPSULE | Freq: Three times a day (TID) | ORAL | 0 refills | Status: DC

## 2020-03-23 MED ORDER — SODIUM CHLORIDE 0.9% FLUSH
10.0000 mL | INTRAVENOUS | Status: DC | PRN
  Administered 2020-03-23: 10 mL
  Filled 2020-03-23: qty 10

## 2020-03-23 MED ORDER — SODIUM CHLORIDE 0.9 % IV SOLN
900.0000 mg/m2 | Freq: Once | INTRAVENOUS | Status: AC
  Administered 2020-03-23: 1862 mg via INTRAVENOUS
  Filled 2020-03-23: qty 48.97

## 2020-03-23 MED ORDER — PACLITAXEL PROTEIN-BOUND CHEMO INJECTION 100 MG
112.5000 mg/m2 | Freq: Once | INTRAVENOUS | Status: AC
  Administered 2020-03-23: 225 mg via INTRAVENOUS
  Filled 2020-03-23: qty 45

## 2020-03-23 MED ORDER — SODIUM CHLORIDE 0.9 % IV SOLN
Freq: Once | INTRAVENOUS | Status: AC
  Filled 2020-03-23: qty 250

## 2020-03-23 MED ORDER — PROCHLORPERAZINE MALEATE 10 MG PO TABS
10.0000 mg | ORAL_TABLET | Freq: Once | ORAL | Status: AC
  Administered 2020-03-23: 10 mg via ORAL

## 2020-03-23 MED ORDER — HEPARIN SOD (PORK) LOCK FLUSH 100 UNIT/ML IV SOLN
500.0000 [IU] | Freq: Once | INTRAVENOUS | Status: AC | PRN
  Administered 2020-03-23: 500 [IU]
  Filled 2020-03-23: qty 5

## 2020-03-23 MED ORDER — PROCHLORPERAZINE MALEATE 10 MG PO TABS
ORAL_TABLET | ORAL | Status: AC
  Filled 2020-03-23: qty 1

## 2020-03-23 NOTE — Progress Notes (Signed)
CBC/CMET reviewed by provider.Ok to treat despite labs per MD.

## 2020-03-23 NOTE — Patient Instructions (Signed)
[facility] Discharge Instructions for Patients Receiving Chemotherapy  Today you received the following chemotherapy agents Abraxane and Gemzar  To help prevent nausea and vomiting after your treatment, we encourage you to take your nausea medication as prescribed by MD.   If you develop nausea and vomiting that is not controlled by your nausea medication, call the clinic.   BELOW ARE SYMPTOMS THAT SHOULD BE REPORTED IMMEDIATELY:  *FEVER GREATER THAN 100.5 F  *CHILLS WITH OR WITHOUT FEVER  NAUSEA AND VOMITING THAT IS NOT CONTROLLED WITH YOUR NAUSEA MEDICATION  *UNUSUAL SHORTNESS OF BREATH  *UNUSUAL BRUISING OR BLEEDING  TENDERNESS IN MOUTH AND THROAT WITH OR WITHOUT PRESENCE OF ULCERS  *URINARY PROBLEMS  *BOWEL PROBLEMS  UNUSUAL RASH Items with * indicate a potential emergency and should be followed up as soon as possible.  Feel free to call the clinic should you have any questions or concerns. The clinic phone number is [phone].  Please show the CHEMO ALERT CARD at check-in to the Emergency Department and triage nurse.   

## 2020-03-23 NOTE — Patient Instructions (Signed)

## 2020-03-23 NOTE — Progress Notes (Signed)
Hematology and Oncology Follow Up Visit  Jeffrey Harris [id number] 30-Apr-1956 64 y.o. 03/23/2020   Principle Diagnosis:  Metastatic adenocarcinoma of the pancreas - hepatic mets  Past Therapy: FOLFIRINOX -- started on 08/11/2019, s/p cycle 11 - d/c on 02/10/2020 due to progression  Current Therapy: Abraxane/Gemzar -- start on 02/24/2020, s/p cycle 1   Interim History:  Jeffrey Harris is here today for follow-up and treatment. He is doing fairly well but does have some fatigue as will as occasional pain in his right upper quadrant.  CA 19.9 in June was 1,511.  No c/o pain and no organomegaly noted on exam. No fever, chills, n/v, cough, rash, dizziness, SOB, chest pain, palpitations, abdominal pain or changes in bowel or bladder habits at this time.  He states that after treatment he has chills and a low grade fever. This resolved after a day or so.  He responded nicely to the Zarxio. WBC count is now 2.5, ANC 1.1.   He is still having several BM's a day and lots of gas.  No swelling or tenderness in his extremities at this time.  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  No falls or syncopal episodes to report.  He states that he has a good appetite and is staying well hydrated. His weight is stable at 193 lbs.   ECOG Performance Status: 1 - Symptomatic but completely ambulatory  Medications:  Allergies as of 03/23/2020   No Known Allergies     Medication List       Accurate as of March 23, 2020  9:02 AM. If you have any questions, ask your nurse or doctor.        diphenoxylate-atropine 2.5-0.025 MG tablet Commonly known as: LOMOTIL Take 1-2 tablets by mouth 4 (four) times daily as needed for diarrhea or loose stools.   gabapentin 300 MG capsule Commonly known as: NEURONTIN Take 1 capsule (300 mg total) by mouth 3 (three) times daily.   hydrocortisone 25 MG suppository Commonly known as: ANUSOL-HC PLACE 1 SUPPOSITORY (25 MG TOTAL) RECTALLY 2 (TWO) TIMES  DAILY. FOR 7 DAYS   lactulose 10 GM/15ML solution Commonly known as: CHRONULAC SMARTSIG:40 Milliliter(s) By Mouth Morning-Evening   linaclotide 145 MCG Caps capsule Commonly known as: Linzess Take 1 capsule (145 mcg total) by mouth daily before breakfast.   lipase/protease/amylase 36000 UNITS Cpep capsule Commonly known as: Creon Take 2 capsules (72,000 Units total) by mouth 3 (three) times daily before meals.   oxyCODONE-acetaminophen 5-325 MG tablet Commonly known as: PERCOCET/ROXICET Take 1 tablet by mouth every 6 (six) hours as needed for severe pain.   pantoprazole 40 MG tablet Commonly known as: PROTONIX TAKE 1 TABLET BY MOUTH TWICE A DAY   potassium chloride SA 20 MEQ tablet Commonly known as: Klor-Con M20 Take 2 tablets (40 mEq total) by mouth daily.   tamsulosin 0.4 MG Caps capsule Commonly known as: FLOMAX Take 0.4 mg by mouth daily.   zolpidem 5 MG tablet Commonly known as: AMBIEN Take 1 tablet (5 mg total) by mouth at bedtime as needed for sleep.       Allergies: No Known Allergies  Past Medical History, Surgical history, Social history, and Family History were reviewed and updated.  Review of Systems: All other 10 point review of systems is negative.   Physical Exam:  vitals were not taken for this visit.   Wt Readings from Last 3 Encounters:  02/24/20 195 lb 4 oz (88.6 kg)  02/11/20 191 lb (86.6  kg)  02/03/20 193 lb (87.5 kg)    Ocular: Sclerae unicteric, pupils equal, round and reactive to light Ear-nose-throat: Oropharynx clear, dentition fair Lymphatic: No cervical, supraclavicular or axillary adenopathy Lungs no rales or rhonchi, good excursion bilaterally Heart regular rate and rhythm, no murmur appreciated Abd soft, nontender, positive bowel sounds, no liver or spleen tip palpated on exam, no fluid wave  MSK no focal spinal tenderness, no joint edema Neuro: non-focal, well-oriented, appropriate affect Breasts: Deferred   Lab Results    Component Value Date   WBC 2.5 (L) 03/23/2020   HGB 9.8 (L) 03/23/2020   HCT 28.3 (L) 03/23/2020   MCV 102.9 (H) 03/23/2020   PLT 157 03/23/2020   Lab Results  Component Value Date   FERRITIN 1,046 (H) 12/02/2019   IRON 97 12/02/2019   TIBC 244 12/02/2019   UIBC 146 12/02/2019   IRONPCTSAT 40 12/02/2019   Lab Results  Component Value Date   RETICCTPCT 2.9 03/23/2020   RBC 2.72 (L) 03/23/2020   RBC 2.75 (L) 03/23/2020   No results found for: KPAFRELGTCHN, LAMBDASER, KAPLAMBRATIO No results found for: IGGSERUM, IGA, IGMSERUM No results found for: TOTALPROTELP, ALBUMINELP, A1GS, A2GS, BETS, BETA2SER, GAMS, MSPIKE, SPEI   Chemistry      Component Value Date/Time   NA 129 (L) 03/23/2020 0831   K 4.2 03/23/2020 0831   CL 98 03/23/2020 0831   CO2 25 03/23/2020 0831   BUN 5 (L) 03/23/2020 0831   CREATININE 0.46 (L) 03/23/2020 0831      Component Value Date/Time   CALCIUM 9.2 03/23/2020 0831   ALKPHOS 190 (H) 03/23/2020 0831   AST 35 03/23/2020 0831   ALT 24 03/23/2020 0831   BILITOT 0.4 03/23/2020 0831       Impression and Plan: Jeffrey Harris is a nice 64 yo white male with metastatic pancreatic cancer.  We will proceed with cycle 2 today as planned per Dr. Marin Olp.  Neurontin refilled.  We will see him again in another 4 weeks for follow-up and new cycle.  He can contact our office with any questions or concerns. We can certainly see him sooner if needed.   Laverna Peace, NP 7/21/20219:02 AM

## 2020-03-24 ENCOUNTER — Encounter: Payer: Self-pay | Admitting: Family

## 2020-03-24 LAB — CANCER ANTIGEN 19-9: CA 19-9: 2950 U/mL — ABNORMAL HIGH (ref 0–35)

## 2020-03-29 ENCOUNTER — Other Ambulatory Visit: Payer: Self-pay | Admitting: *Deleted

## 2020-03-29 ENCOUNTER — Other Ambulatory Visit: Payer: Self-pay | Admitting: Family

## 2020-03-29 DIAGNOSIS — C787 Secondary malignant neoplasm of liver and intrahepatic bile duct: Secondary | ICD-10-CM

## 2020-03-30 ENCOUNTER — Other Ambulatory Visit: Payer: Self-pay

## 2020-03-30 ENCOUNTER — Inpatient Hospital Stay: Payer: BC Managed Care – PPO

## 2020-03-30 DIAGNOSIS — C787 Secondary malignant neoplasm of liver and intrahepatic bile duct: Secondary | ICD-10-CM | POA: Diagnosis not present

## 2020-03-30 DIAGNOSIS — Z79899 Other long term (current) drug therapy: Secondary | ICD-10-CM | POA: Diagnosis not present

## 2020-03-30 DIAGNOSIS — Z5111 Encounter for antineoplastic chemotherapy: Secondary | ICD-10-CM | POA: Diagnosis not present

## 2020-03-30 DIAGNOSIS — C259 Malignant neoplasm of pancreas, unspecified: Secondary | ICD-10-CM

## 2020-03-30 DIAGNOSIS — C250 Malignant neoplasm of head of pancreas: Secondary | ICD-10-CM | POA: Diagnosis not present

## 2020-03-30 DIAGNOSIS — G629 Polyneuropathy, unspecified: Secondary | ICD-10-CM | POA: Diagnosis not present

## 2020-03-30 DIAGNOSIS — G893 Neoplasm related pain (acute) (chronic): Secondary | ICD-10-CM | POA: Diagnosis not present

## 2020-03-30 DIAGNOSIS — R5383 Other fatigue: Secondary | ICD-10-CM | POA: Diagnosis not present

## 2020-03-30 LAB — CBC WITH DIFFERENTIAL (CANCER CENTER ONLY)
Abs Immature Granulocytes: 0.02 10*3/uL (ref 0.00–0.07)
Basophils Absolute: 0 10*3/uL (ref 0.0–0.1)
Basophils Relative: 1 %
Eosinophils Absolute: 0 10*3/uL (ref 0.0–0.5)
Eosinophils Relative: 0 %
HCT: 25.2 % — ABNORMAL LOW (ref 39.0–52.0)
Hemoglobin: 8.8 g/dL — ABNORMAL LOW (ref 13.0–17.0)
Immature Granulocytes: 1 %
Lymphocytes Relative: 39 %
Lymphs Abs: 0.9 10*3/uL (ref 0.7–4.0)
MCH: 35.8 pg — ABNORMAL HIGH (ref 26.0–34.0)
MCHC: 34.9 g/dL (ref 30.0–36.0)
MCV: 102.4 fL — ABNORMAL HIGH (ref 80.0–100.0)
Monocytes Absolute: 0.2 10*3/uL (ref 0.1–1.0)
Monocytes Relative: 10 %
Neutro Abs: 1.1 10*3/uL — ABNORMAL LOW (ref 1.7–7.7)
Neutrophils Relative %: 49 %
Platelet Count: 103 10*3/uL — ABNORMAL LOW (ref 150–400)
RBC: 2.46 MIL/uL — ABNORMAL LOW (ref 4.22–5.81)
RDW: 14.2 % (ref 11.5–15.5)
WBC Count: 2.3 10*3/uL — ABNORMAL LOW (ref 4.0–10.5)
nRBC: 0 % (ref 0.0–0.2)

## 2020-03-30 LAB — CMP (CANCER CENTER ONLY)
ALT: 33 U/L (ref 0–44)
AST: 48 U/L — ABNORMAL HIGH (ref 15–41)
Albumin: 3.7 g/dL (ref 3.5–5.0)
Alkaline Phosphatase: 199 U/L — ABNORMAL HIGH (ref 38–126)
Anion gap: 7 (ref 5–15)
BUN: 6 mg/dL — ABNORMAL LOW (ref 8–23)
CO2: 26 mmol/L (ref 22–32)
Calcium: 9.5 mg/dL (ref 8.9–10.3)
Chloride: 97 mmol/L — ABNORMAL LOW (ref 98–111)
Creatinine: 0.48 mg/dL — ABNORMAL LOW (ref 0.61–1.24)
GFR, Est AFR Am: >60 mL/min (ref >60)
GFR, Estimated: >60 mL/min (ref >60)
Glucose, Bld: 141 mg/dL — ABNORMAL HIGH (ref 70–99)
Potassium: 3.9 mmol/L (ref 3.5–5.1)
Sodium: 130 mmol/L — ABNORMAL LOW (ref 135–145)
Total Bilirubin: 0.4 mg/dL (ref 0.3–1.2)
Total Protein: 7 g/dL (ref 6.5–8.1)

## 2020-03-30 MED ORDER — SODIUM CHLORIDE 0.9 % IV SOLN
900.0000 mg/m2 | Freq: Once | INTRAVENOUS | Status: AC
  Administered 2020-03-30: 1862 mg via INTRAVENOUS
  Filled 2020-03-30: qty 48.97

## 2020-03-30 MED ORDER — PACLITAXEL PROTEIN-BOUND CHEMO INJECTION 100 MG
112.5000 mg/m2 | Freq: Once | INTRAVENOUS | Status: AC
  Administered 2020-03-30: 225 mg via INTRAVENOUS
  Filled 2020-03-30: qty 45

## 2020-03-30 MED ORDER — SODIUM CHLORIDE 0.9% FLUSH
10.0000 mL | INTRAVENOUS | Status: DC | PRN
  Administered 2020-03-30: 10 mL
  Filled 2020-03-30: qty 10

## 2020-03-30 MED ORDER — SODIUM CHLORIDE 0.9 % IV SOLN
Freq: Once | INTRAVENOUS | Status: DC
  Filled 2020-03-30: qty 250

## 2020-03-30 MED ORDER — HEPARIN SOD (PORK) LOCK FLUSH 100 UNIT/ML IV SOLN
500.0000 [IU] | Freq: Once | INTRAVENOUS | Status: AC | PRN
  Administered 2020-03-30: 500 [IU]
  Filled 2020-03-30: qty 5

## 2020-03-30 MED ORDER — PROCHLORPERAZINE MALEATE 10 MG PO TABS
10.0000 mg | ORAL_TABLET | Freq: Once | ORAL | Status: AC
  Administered 2020-03-30: 10 mg via ORAL

## 2020-03-30 MED ORDER — SODIUM CHLORIDE 0.9 % IV SOLN
Freq: Once | INTRAVENOUS | Status: AC
  Filled 2020-03-30: qty 250

## 2020-03-30 MED ORDER — PROCHLORPERAZINE MALEATE 10 MG PO TABS
ORAL_TABLET | ORAL | Status: AC
  Filled 2020-03-30: qty 1

## 2020-03-30 NOTE — Patient Instructions (Signed)
[facility] Discharge Instructions for Patients Receiving Chemotherapy  Today you received the following chemotherapy agents Abraxane and Gemzar  To help prevent nausea and vomiting after your treatment, we encourage you to take your nausea medication as prescribed by MD.   If you develop nausea and vomiting that is not controlled by your nausea medication, call the clinic.   BELOW ARE SYMPTOMS THAT SHOULD BE REPORTED IMMEDIATELY:  *FEVER GREATER THAN 100.5 F  *CHILLS WITH OR WITHOUT FEVER  NAUSEA AND VOMITING THAT IS NOT CONTROLLED WITH YOUR NAUSEA MEDICATION  *UNUSUAL SHORTNESS OF BREATH  *UNUSUAL BRUISING OR BLEEDING  TENDERNESS IN MOUTH AND THROAT WITH OR WITHOUT PRESENCE OF ULCERS  *URINARY PROBLEMS  *BOWEL PROBLEMS  UNUSUAL RASH Items with * indicate a potential emergency and should be followed up as soon as possible.  Feel free to call the clinic should you have any questions or concerns. The clinic phone number is [phone].  Please show the CHEMO ALERT CARD at check-in to the Emergency Department and triage nurse.   

## 2020-03-30 NOTE — Progress Notes (Signed)
OK to treat with today's blood test results per Dr. Ennever. 

## 2020-04-03 ENCOUNTER — Encounter: Payer: Self-pay | Admitting: Hematology & Oncology

## 2020-04-04 ENCOUNTER — Telehealth: Payer: Self-pay | Admitting: *Deleted

## 2020-04-04 NOTE — Telephone Encounter (Signed)
Returned call to pt discussed taking nausea medication, the side effects of the chemotherapy regiment he is taking.,on. Pt to take zofran now( 9am) if additional n/v pt to take compazine q 6hrs and zofran tonight at 9pm. Pt to continue checking temp, take tylenol as needed. Encouraged fluids and small meals. Pt thanked me for the return call, verbalized understanding and will call back with any other concerns.

## 2020-04-05 ENCOUNTER — Encounter: Payer: Self-pay | Admitting: *Deleted

## 2020-04-06 ENCOUNTER — Ambulatory Visit: Payer: BC Managed Care – PPO

## 2020-04-06 ENCOUNTER — Other Ambulatory Visit: Payer: BC Managed Care – PPO

## 2020-04-07 DIAGNOSIS — Z125 Encounter for screening for malignant neoplasm of prostate: Secondary | ICD-10-CM | POA: Diagnosis not present

## 2020-04-07 DIAGNOSIS — Z13220 Encounter for screening for lipoid disorders: Secondary | ICD-10-CM | POA: Diagnosis not present

## 2020-04-07 DIAGNOSIS — Z0000 Encounter for general adult medical examination without abnormal findings: Secondary | ICD-10-CM | POA: Diagnosis not present

## 2020-04-08 ENCOUNTER — Encounter: Payer: Self-pay | Admitting: Hematology & Oncology

## 2020-04-08 ENCOUNTER — Other Ambulatory Visit: Payer: Self-pay | Admitting: Internal Medicine

## 2020-04-13 ENCOUNTER — Inpatient Hospital Stay: Payer: BC Managed Care – PPO

## 2020-04-13 ENCOUNTER — Inpatient Hospital Stay (HOSPITAL_BASED_OUTPATIENT_CLINIC_OR_DEPARTMENT_OTHER): Payer: BC Managed Care – PPO | Admitting: Hematology & Oncology

## 2020-04-13 ENCOUNTER — Inpatient Hospital Stay: Payer: BC Managed Care – PPO | Attending: Hematology & Oncology

## 2020-04-13 ENCOUNTER — Other Ambulatory Visit: Payer: Self-pay

## 2020-04-13 ENCOUNTER — Encounter: Payer: Self-pay | Admitting: Hematology & Oncology

## 2020-04-13 VITALS — BP 99/73 | HR 77 | Temp 98.2°F | Resp 16 | Ht 71.0 in | Wt 187.0 lb

## 2020-04-13 DIAGNOSIS — D508 Other iron deficiency anemias: Secondary | ICD-10-CM

## 2020-04-13 DIAGNOSIS — Z5111 Encounter for antineoplastic chemotherapy: Secondary | ICD-10-CM | POA: Diagnosis not present

## 2020-04-13 DIAGNOSIS — C259 Malignant neoplasm of pancreas, unspecified: Secondary | ICD-10-CM

## 2020-04-13 DIAGNOSIS — C250 Malignant neoplasm of head of pancreas: Secondary | ICD-10-CM | POA: Insufficient documentation

## 2020-04-13 DIAGNOSIS — C787 Secondary malignant neoplasm of liver and intrahepatic bile duct: Secondary | ICD-10-CM

## 2020-04-13 DIAGNOSIS — G620 Drug-induced polyneuropathy: Secondary | ICD-10-CM | POA: Diagnosis not present

## 2020-04-13 DIAGNOSIS — R978 Other abnormal tumor markers: Secondary | ICD-10-CM | POA: Insufficient documentation

## 2020-04-13 DIAGNOSIS — D701 Agranulocytosis secondary to cancer chemotherapy: Secondary | ICD-10-CM | POA: Diagnosis not present

## 2020-04-13 DIAGNOSIS — Z79899 Other long term (current) drug therapy: Secondary | ICD-10-CM | POA: Insufficient documentation

## 2020-04-13 DIAGNOSIS — D649 Anemia, unspecified: Secondary | ICD-10-CM

## 2020-04-13 LAB — IRON AND TIBC
Iron: 92 ug/dL (ref 42–163)
Saturation Ratios: 40 % (ref 20–55)
TIBC: 231 ug/dL (ref 202–409)
UIBC: 139 ug/dL (ref 117–376)

## 2020-04-13 LAB — CBC WITH DIFFERENTIAL (CANCER CENTER ONLY)
Abs Immature Granulocytes: 0.02 10*3/uL (ref 0.00–0.07)
Basophils Absolute: 0 10*3/uL (ref 0.0–0.1)
Basophils Relative: 1 %
Eosinophils Absolute: 0.1 10*3/uL (ref 0.0–0.5)
Eosinophils Relative: 3 %
HCT: 26.9 % — ABNORMAL LOW (ref 39.0–52.0)
Hemoglobin: 9.1 g/dL — ABNORMAL LOW (ref 13.0–17.0)
Immature Granulocytes: 1 %
Lymphocytes Relative: 37 %
Lymphs Abs: 1 10*3/uL (ref 0.7–4.0)
MCH: 35.7 pg — ABNORMAL HIGH (ref 26.0–34.0)
MCHC: 33.8 g/dL (ref 30.0–36.0)
MCV: 105.5 fL — ABNORMAL HIGH (ref 80.0–100.0)
Monocytes Absolute: 0.5 10*3/uL (ref 0.1–1.0)
Monocytes Relative: 17 %
Neutro Abs: 1.1 10*3/uL — ABNORMAL LOW (ref 1.7–7.7)
Neutrophils Relative %: 41 %
Platelet Count: 225 10*3/uL (ref 150–400)
RBC: 2.55 MIL/uL — ABNORMAL LOW (ref 4.22–5.81)
RDW: 15.6 % — ABNORMAL HIGH (ref 11.5–15.5)
WBC Count: 2.8 10*3/uL — ABNORMAL LOW (ref 4.0–10.5)
nRBC: 0 % (ref 0.0–0.2)

## 2020-04-13 LAB — CMP (CANCER CENTER ONLY)
ALT: 25 U/L (ref 0–44)
AST: 35 U/L (ref 15–41)
Albumin: 3.5 g/dL (ref 3.5–5.0)
Alkaline Phosphatase: 191 U/L — ABNORMAL HIGH (ref 38–126)
Anion gap: 7 (ref 5–15)
BUN: 9 mg/dL (ref 8–23)
CO2: 27 mmol/L (ref 22–32)
Calcium: 8.8 mg/dL — ABNORMAL LOW (ref 8.9–10.3)
Chloride: 100 mmol/L (ref 98–111)
Creatinine: 0.52 mg/dL — ABNORMAL LOW (ref 0.61–1.24)
GFR, Est AFR Am: >60 mL/min (ref >60)
GFR, Estimated: >60 mL/min (ref >60)
Glucose, Bld: 97 mg/dL (ref 70–99)
Potassium: 4 mmol/L (ref 3.5–5.1)
Sodium: 134 mmol/L — ABNORMAL LOW (ref 135–145)
Total Bilirubin: 0.4 mg/dL (ref 0.3–1.2)
Total Protein: 6.5 g/dL (ref 6.5–8.1)

## 2020-04-13 LAB — RETICULOCYTES
Immature Retic Fract: 18.7 % — ABNORMAL HIGH (ref 2.3–15.9)
RBC.: 2.56 MIL/uL — ABNORMAL LOW (ref 4.22–5.81)
Retic Count, Absolute: 133.6 10*3/uL (ref 19.0–186.0)
Retic Ct Pct: 5.2 % — ABNORMAL HIGH (ref 0.4–3.1)

## 2020-04-13 LAB — SAMPLE TO BLOOD BANK

## 2020-04-13 LAB — FERRITIN: Ferritin: 1254 ng/mL — ABNORMAL HIGH (ref 24–336)

## 2020-04-13 LAB — LACTATE DEHYDROGENASE: LDH: 198 U/L — ABNORMAL HIGH (ref 98–192)

## 2020-04-13 MED ORDER — PROCHLORPERAZINE MALEATE 10 MG PO TABS
10.0000 mg | ORAL_TABLET | Freq: Once | ORAL | Status: AC
  Administered 2020-04-13: 10 mg via ORAL

## 2020-04-13 MED ORDER — SODIUM CHLORIDE 0.9 % IV SOLN
Freq: Once | INTRAVENOUS | Status: AC
  Filled 2020-04-13: qty 250

## 2020-04-13 MED ORDER — SODIUM CHLORIDE 0.9 % IV SOLN
900.0000 mg/m2 | Freq: Once | INTRAVENOUS | Status: AC
  Administered 2020-04-13: 1862 mg via INTRAVENOUS
  Filled 2020-04-13: qty 48.97

## 2020-04-13 MED ORDER — SODIUM CHLORIDE 0.9% FLUSH
10.0000 mL | INTRAVENOUS | Status: DC | PRN
  Administered 2020-04-13: 10 mL
  Filled 2020-04-13: qty 10

## 2020-04-13 MED ORDER — PACLITAXEL PROTEIN-BOUND CHEMO INJECTION 100 MG
112.5000 mg/m2 | Freq: Once | INTRAVENOUS | Status: AC
  Administered 2020-04-13: 225 mg via INTRAVENOUS
  Filled 2020-04-13: qty 45

## 2020-04-13 MED ORDER — PROCHLORPERAZINE MALEATE 10 MG PO TABS
ORAL_TABLET | ORAL | Status: AC
  Filled 2020-04-13: qty 1

## 2020-04-13 MED ORDER — HEPARIN SOD (PORK) LOCK FLUSH 100 UNIT/ML IV SOLN
500.0000 [IU] | Freq: Once | INTRAVENOUS | Status: AC | PRN
  Administered 2020-04-13: 500 [IU]
  Filled 2020-04-13: qty 5

## 2020-04-13 NOTE — Progress Notes (Signed)
Hematology and Oncology Follow Up Visit  Jeffrey Harris [id number] 1956-07-25 64 y.o. 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  He has had a little bit of time off to help with blood counts.  His blood counts are better.  He probably does need some colony stimulating factor to try to help with his white cells.  His last CA 19-9 was elevated.  Some of this may have been secondary to him having extra time off.  The CA 9-9 was 3000.  Today, the level went down to 1830.  He does have some neuropathy.  This probably is from the Abraxane.  He is still able to function at a fairly high level.  He has had no problems with bowels or bladder.  He has had some loose stool.  He has had no problems with cough.  He has had no fever.  He has had a decent appetite.  His weight is down just a little bit.  He has had no leg swelling.  There is been no bleeding or bruising.  He has had no headache.  Overall, his performance status is ECOG 1.     Medications:  Allergies as of 04/13/2020   No Known Allergies     Medication List       Accurate as of April 13, 2020  9:54 AM. If you have any questions, ask your nurse or doctor.        allopurinol 300 MG tablet Commonly known as: ZYLOPRIM Take 300 mg by mouth daily.   diphenoxylate-atropine 2.5-0.025 MG tablet Commonly known as: LOMOTIL Take 1-2 tablets by mouth 4 (four) times daily as needed for diarrhea or loose stools.   gabapentin 300 MG capsule Commonly known as: NEURONTIN Take 1 capsule (300 mg total) by mouth 3 (three) times daily.   hydrocortisone 25 MG suppository Commonly known as: ANUSOL-HC PLACE 1 SUPPOSITORY (25 MG  TOTAL) RECTALLY 2 (TWO) TIMES DAILY. FOR 7 DAYS   lactulose 10 GM/15ML solution Commonly known as: CHRONULAC SMARTSIG:40 Milliliter(s) By Mouth Morning-Evening   linaclotide 145 MCG Caps capsule Commonly known as: Linzess Take 1 capsule (145 mcg total) by mouth daily before breakfast.   lipase/protease/amylase 36000 UNITS Cpep capsule Commonly known as: Creon Take 2 capsules (72,000 Units total) by mouth 3 (three) times daily before meals.   oxyCODONE-acetaminophen 5-325 MG tablet Commonly known as: PERCOCET/ROXICET Take 1 tablet by mouth every 6 (six) hours as needed for severe pain.   pantoprazole 40 MG tablet Commonly known as: PROTONIX TAKE 1 TABLET BY MOUTH TWICE A DAY   potassium chloride SA 20 MEQ tablet Commonly known as: Klor-Con M20 Take 2 tablets (40 mEq total) by mouth daily.   tamsulosin 0.4 MG Caps capsule Commonly known as: FLOMAX Take 0.4 mg by mouth daily.   zolpidem 5 MG tablet Commonly known as: AMBIEN Take 1 tablet (5 mg total) by mouth at bedtime as needed for sleep.       Allergies: No Known Allergies  Past Medical History, Surgical history, Social history, and Family History were reviewed and updated.  Review of Systems: Review of Systems  Constitutional: Negative.   HENT: Negative.   Eyes: Negative.   Respiratory: Negative.  Cardiovascular: Negative.   Gastrointestinal: Negative.   Genitourinary: Negative.   Musculoskeletal: Negative.   Skin: Negative.   Neurological: Negative.   Endo/Heme/Allergies: Negative.   Psychiatric/Behavioral: Negative.      Physical Exam:  height is 5\' 11"  (1.803 m) and weight is 187 lb (84.8 kg). His oral temperature is 98.2 F (36.8 C). His blood pressure is 99/73 and his pulse is 77. His respiration is 16 and oxygen saturation is 100%.   Wt Readings from Last 3 Encounters:  04/13/20 187 lb (84.8 kg)  03/23/20 193 lb 1.9 oz (87.6 kg)  02/24/20 195 lb 4 oz (88.6 kg)    Physical Exam Vitals  reviewed.  HENT:     Head: Normocephalic and atraumatic.  Eyes:     Pupils: Pupils are equal, round, and reactive to light.  Cardiovascular:     Rate and Rhythm: Normal rate and regular rhythm.     Heart sounds: Normal heart sounds.  Pulmonary:     Effort: Pulmonary effort is normal.     Breath sounds: Normal breath sounds.  Abdominal:     General: Bowel sounds are normal.     Palpations: Abdomen is soft.  Musculoskeletal:        General: No tenderness or deformity. Normal range of motion.     Cervical back: Normal range of motion.  Lymphadenopathy:     Cervical: No cervical adenopathy.  Skin:    General: Skin is warm and dry.     Findings: No erythema or rash.  Neurological:     Mental Status: He is alert and oriented to person, place, and time.  Psychiatric:        Behavior: Behavior normal.        Thought Content: Thought content normal.        Judgment: Judgment normal.       Lab Results  Component Value Date   WBC 2.8 (L) 04/13/2020   HGB 9.1 (L) 04/13/2020   HCT 26.9 (L) 04/13/2020   MCV 105.5 (H) 04/13/2020   PLT 225 04/13/2020   Lab Results  Component Value Date   FERRITIN 1,258 (H) 03/23/2020   IRON 59 03/23/2020   TIBC 248 03/23/2020   UIBC 188 03/23/2020   IRONPCTSAT 24 03/23/2020   Lab Results  Component Value Date   RETICCTPCT 5.2 (H) 04/13/2020   RBC 2.56 (L) 04/13/2020   RBC 2.55 (L) 04/13/2020   No results found for: KPAFRELGTCHN, LAMBDASER, KAPLAMBRATIO No results found for: IGGSERUM, IGA, IGMSERUM No results found for: TOTALPROTELP, ALBUMINELP, A1GS, A2GS, BETS, BETA2SER, GAMS, MSPIKE, SPEI   Chemistry      Component Value Date/Time   NA 134 (L) 04/13/2020 0912   K 4.0 04/13/2020 0912   CL 100 04/13/2020 0912   CO2 27 04/13/2020 0912   BUN 9 04/13/2020 0912   CREATININE 0.52 (L) 04/13/2020 0912      Component Value Date/Time   CALCIUM 8.8 (L) 04/13/2020 0912   ALKPHOS 191 (H) 04/13/2020 0912   AST 35 04/13/2020 0912   ALT 25  04/13/2020 0912   BILITOT 0.4 04/13/2020 0912       Impression and Plan: Jeffrey Harris is a nice 64 yo white male with metastatic pancreatic cancer.  He is on second line therapy with Gemzar/Abraxane.  His CA 19-9 have been coming down nicely.  After a little bit of a delay in treatment, the level went back up.  Now it is coming back down.  This is his third  cycle of treatment.  We will go ahead and plan for follow-up CT scan after this treatment.  Again he will need some Neulasta or maybe Neupogen after day #8.  I just want to make sure that his quality of life is doing well.  He wants to be active.  He wants to be able to play golf with his buddies.  This is very important for him.  Again, we will see how everything looks with the CT scan.  We will get this in about 3 weeks.  We will give him extra week off just to be able to recover. ee him sooner if needed.   Volanda Napoleon, MD 8/11/20219:54 AM

## 2020-04-13 NOTE — Progress Notes (Signed)
Reviewed labs with Dr. Ennever.  Ok to treat today. 

## 2020-04-13 NOTE — Patient Instructions (Signed)

## 2020-04-13 NOTE — Patient Instructions (Signed)
[facility] Discharge Instructions for Patients Receiving Chemotherapy  Today you received the following chemotherapy agents Abraxane, Gemzar  To help prevent nausea and vomiting after your treatment, we encourage you to take your nausea medication    If you develop nausea and vomiting that is not controlled by your nausea medication, call the clinic.   BELOW ARE SYMPTOMS THAT SHOULD BE REPORTED IMMEDIATELY:  *FEVER GREATER THAN 100.5 F  *CHILLS WITH OR WITHOUT FEVER  NAUSEA AND VOMITING THAT IS NOT CONTROLLED WITH YOUR NAUSEA MEDICATION  *UNUSUAL SHORTNESS OF BREATH  *UNUSUAL BRUISING OR BLEEDING  TENDERNESS IN MOUTH AND THROAT WITH OR WITHOUT PRESENCE OF ULCERS  *URINARY PROBLEMS  *BOWEL PROBLEMS  UNUSUAL RASH Items with * indicate a potential emergency and should be followed up as soon as possible.  Feel free to call the clinic should you have any questions or concerns. The clinic phone number is [phone].  Please show the [place] at check-in to the Emergency Department and triage nurse.

## 2020-04-14 ENCOUNTER — Telehealth: Payer: Self-pay | Admitting: Hematology & Oncology

## 2020-04-14 DIAGNOSIS — G5603 Carpal tunnel syndrome, bilateral upper limbs: Secondary | ICD-10-CM | POA: Diagnosis not present

## 2020-04-14 DIAGNOSIS — M109 Gout, unspecified: Secondary | ICD-10-CM | POA: Diagnosis not present

## 2020-04-14 DIAGNOSIS — Z0000 Encounter for general adult medical examination without abnormal findings: Secondary | ICD-10-CM | POA: Diagnosis not present

## 2020-04-14 DIAGNOSIS — J309 Allergic rhinitis, unspecified: Secondary | ICD-10-CM | POA: Diagnosis not present

## 2020-04-14 LAB — CANCER ANTIGEN 19-9: CA 19-9: 1833 U/mL — ABNORMAL HIGH (ref 0–35)

## 2020-04-14 NOTE — Telephone Encounter (Signed)
Appointments scheduled calendar printed & mailed per 8/11 los

## 2020-04-19 ENCOUNTER — Other Ambulatory Visit: Payer: Self-pay | Admitting: Family

## 2020-04-19 DIAGNOSIS — C787 Secondary malignant neoplasm of liver and intrahepatic bile duct: Secondary | ICD-10-CM

## 2020-04-19 DIAGNOSIS — C259 Malignant neoplasm of pancreas, unspecified: Secondary | ICD-10-CM

## 2020-04-19 DIAGNOSIS — G629 Polyneuropathy, unspecified: Secondary | ICD-10-CM

## 2020-04-20 ENCOUNTER — Other Ambulatory Visit: Payer: BC Managed Care – PPO

## 2020-04-20 ENCOUNTER — Ambulatory Visit: Payer: BC Managed Care – PPO | Admitting: Hematology & Oncology

## 2020-04-20 ENCOUNTER — Encounter: Payer: Self-pay | Admitting: Hematology & Oncology

## 2020-04-20 ENCOUNTER — Other Ambulatory Visit: Payer: Self-pay

## 2020-04-20 ENCOUNTER — Inpatient Hospital Stay: Payer: BC Managed Care – PPO

## 2020-04-20 ENCOUNTER — Ambulatory Visit: Payer: BC Managed Care – PPO

## 2020-04-20 ENCOUNTER — Inpatient Hospital Stay (HOSPITAL_BASED_OUTPATIENT_CLINIC_OR_DEPARTMENT_OTHER): Payer: BC Managed Care – PPO | Admitting: Hematology & Oncology

## 2020-04-20 VITALS — BP 112/67 | HR 91 | Temp 98.6°F | Resp 17 | Wt 185.0 lb

## 2020-04-20 DIAGNOSIS — C787 Secondary malignant neoplasm of liver and intrahepatic bile duct: Secondary | ICD-10-CM

## 2020-04-20 DIAGNOSIS — Z5111 Encounter for antineoplastic chemotherapy: Secondary | ICD-10-CM | POA: Diagnosis not present

## 2020-04-20 DIAGNOSIS — C259 Malignant neoplasm of pancreas, unspecified: Secondary | ICD-10-CM

## 2020-04-20 DIAGNOSIS — G620 Drug-induced polyneuropathy: Secondary | ICD-10-CM | POA: Diagnosis not present

## 2020-04-20 DIAGNOSIS — C250 Malignant neoplasm of head of pancreas: Secondary | ICD-10-CM | POA: Diagnosis not present

## 2020-04-20 DIAGNOSIS — R978 Other abnormal tumor markers: Secondary | ICD-10-CM | POA: Diagnosis not present

## 2020-04-20 DIAGNOSIS — Z79899 Other long term (current) drug therapy: Secondary | ICD-10-CM | POA: Diagnosis not present

## 2020-04-20 DIAGNOSIS — D701 Agranulocytosis secondary to cancer chemotherapy: Secondary | ICD-10-CM | POA: Diagnosis not present

## 2020-04-20 LAB — CMP (CANCER CENTER ONLY)
ALT: 31 U/L (ref 0–44)
AST: 48 U/L — ABNORMAL HIGH (ref 15–41)
Albumin: 3.4 g/dL — ABNORMAL LOW (ref 3.5–5.0)
Alkaline Phosphatase: 219 U/L — ABNORMAL HIGH (ref 38–126)
Anion gap: 7 (ref 5–15)
BUN: 7 mg/dL — ABNORMAL LOW (ref 8–23)
CO2: 27 mmol/L (ref 22–32)
Calcium: 9.1 mg/dL (ref 8.9–10.3)
Chloride: 99 mmol/L (ref 98–111)
Creatinine: 0.58 mg/dL — ABNORMAL LOW (ref 0.61–1.24)
GFR, Est AFR Am: >60 mL/min (ref >60)
GFR, Estimated: >60 mL/min (ref >60)
Glucose, Bld: 114 mg/dL — ABNORMAL HIGH (ref 70–99)
Potassium: 4.2 mmol/L (ref 3.5–5.1)
Sodium: 133 mmol/L — ABNORMAL LOW (ref 135–145)
Total Bilirubin: 0.5 mg/dL (ref 0.3–1.2)
Total Protein: 6.7 g/dL (ref 6.5–8.1)

## 2020-04-20 LAB — CBC WITH DIFFERENTIAL (CANCER CENTER ONLY)
Abs Immature Granulocytes: 0.02 10*3/uL (ref 0.00–0.07)
Basophils Absolute: 0 10*3/uL (ref 0.0–0.1)
Basophils Relative: 1 %
Eosinophils Absolute: 0 10*3/uL (ref 0.0–0.5)
Eosinophils Relative: 0 %
HCT: 24.9 % — ABNORMAL LOW (ref 39.0–52.0)
Hemoglobin: 8.6 g/dL — ABNORMAL LOW (ref 13.0–17.0)
Immature Granulocytes: 1 %
Lymphocytes Relative: 32 %
Lymphs Abs: 0.8 10*3/uL (ref 0.7–4.0)
MCH: 35.5 pg — ABNORMAL HIGH (ref 26.0–34.0)
MCHC: 34.5 g/dL (ref 30.0–36.0)
MCV: 102.9 fL — ABNORMAL HIGH (ref 80.0–100.0)
Monocytes Absolute: 0.2 10*3/uL (ref 0.1–1.0)
Monocytes Relative: 8 %
Neutro Abs: 1.5 10*3/uL — ABNORMAL LOW (ref 1.7–7.7)
Neutrophils Relative %: 58 %
Platelet Count: 109 10*3/uL — ABNORMAL LOW (ref 150–400)
RBC: 2.42 MIL/uL — ABNORMAL LOW (ref 4.22–5.81)
RDW: 14.6 % (ref 11.5–15.5)
WBC Count: 2.6 10*3/uL — ABNORMAL LOW (ref 4.0–10.5)
nRBC: 0 % (ref 0.0–0.2)

## 2020-04-20 MED ORDER — PROCHLORPERAZINE MALEATE 10 MG PO TABS
10.0000 mg | ORAL_TABLET | Freq: Once | ORAL | Status: AC
  Administered 2020-04-20: 10 mg via ORAL

## 2020-04-20 MED ORDER — PROCHLORPERAZINE MALEATE 10 MG PO TABS
ORAL_TABLET | ORAL | Status: AC
  Filled 2020-04-20: qty 1

## 2020-04-20 MED ORDER — SODIUM CHLORIDE 0.9% FLUSH
10.0000 mL | INTRAVENOUS | Status: DC | PRN
  Administered 2020-04-20: 10 mL
  Filled 2020-04-20: qty 10

## 2020-04-20 MED ORDER — PACLITAXEL PROTEIN-BOUND CHEMO INJECTION 100 MG
112.5000 mg/m2 | Freq: Once | INTRAVENOUS | Status: AC
  Administered 2020-04-20: 225 mg via INTRAVENOUS
  Filled 2020-04-20: qty 45

## 2020-04-20 MED ORDER — SODIUM CHLORIDE 0.9 % IV SOLN
900.0000 mg/m2 | Freq: Once | INTRAVENOUS | Status: AC
  Administered 2020-04-20: 1862 mg via INTRAVENOUS
  Filled 2020-04-20: qty 48.97

## 2020-04-20 MED ORDER — SODIUM CHLORIDE 0.9 % IV SOLN
Freq: Once | INTRAVENOUS | Status: AC
  Filled 2020-04-20: qty 250

## 2020-04-20 MED ORDER — HEPARIN SOD (PORK) LOCK FLUSH 100 UNIT/ML IV SOLN
500.0000 [IU] | Freq: Once | INTRAVENOUS | Status: AC | PRN
  Administered 2020-04-20: 500 [IU]
  Filled 2020-04-20: qty 5

## 2020-04-20 NOTE — Patient Instructions (Signed)

## 2020-04-20 NOTE — Patient Instructions (Signed)
[facility] Discharge Instructions for Patients Receiving Chemotherapy  Today you received the following chemotherapy agents Abraxane and Gemzar  To help prevent nausea and vomiting after your treatment, we encourage you to take your nausea medication as prescribed by MD.   If you develop nausea and vomiting that is not controlled by your nausea medication, call the clinic.   BELOW ARE SYMPTOMS THAT SHOULD BE REPORTED IMMEDIATELY:  *FEVER GREATER THAN 100.5 F  *CHILLS WITH OR WITHOUT FEVER  NAUSEA AND VOMITING THAT IS NOT CONTROLLED WITH YOUR NAUSEA MEDICATION  *UNUSUAL SHORTNESS OF BREATH  *UNUSUAL BRUISING OR BLEEDING  TENDERNESS IN MOUTH AND THROAT WITH OR WITHOUT PRESENCE OF ULCERS  *URINARY PROBLEMS  *BOWEL PROBLEMS  UNUSUAL RASH Items with * indicate a potential emergency and should be followed up as soon as possible.  Feel free to call the clinic should you have any questions or concerns. The clinic phone number is [phone].  Please show the [place] at check-in to the Emergency Department and triage nurse.

## 2020-04-20 NOTE — Progress Notes (Signed)
Hematology and Oncology Follow Up Visit  Jeffrey Harris [id number] 1956/05/02 64 y.o. 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  He has had a little bit of time off to help with blood counts.  His blood counts are better.  He probably does need some colony stimulating factor to try to help with his white cells.  His last CA 19-9 was coming back down.  Her last checked a week ago, the CA 19-I was down to 1830.  He does have some neuropathy.  This probably is from the Abraxane.  He is on gabapentin.  He takes 300 mg 3 times a day and then 600 mg at bedtime.  He feels that the neuropathy is holding stable.   He has had no problems with bowels or bladder.  He has had some loose stool.  He has had no problems with cough.  He has had no fever.  He has had a decent appetite.  His weight is down just a little bit.  He has had no leg swelling.  There is been no bleeding or bruising.  He has had no headache.  Overall, his performance status is ECOG 1.     Medications:  Allergies as of 04/20/2020   No Known Allergies     Medication List       Accurate as of April 20, 2020  9:06 AM. If you have any questions, ask your nurse or doctor.        allopurinol 300 MG tablet Commonly known as: ZYLOPRIM Take 300 mg by mouth daily.   diphenoxylate-atropine 2.5-0.025 MG tablet Commonly known as: LOMOTIL Take 1-2 tablets by mouth 4 (four) times daily as needed for diarrhea or loose stools.   gabapentin 300 MG capsule Commonly known as: NEURONTIN TAKE 1 CAPSULE BY MOUTH THREE TIMES A DAY   hydrocortisone 25 MG suppository Commonly known as: ANUSOL-HC PLACE 1 SUPPOSITORY (25 MG TOTAL)  RECTALLY 2 (TWO) TIMES DAILY. FOR 7 DAYS   lactulose 10 GM/15ML solution Commonly known as: CHRONULAC SMARTSIG:40 Milliliter(s) By Mouth Morning-Evening   linaclotide 145 MCG Caps capsule Commonly known as: Linzess Take 1 capsule (145 mcg total) by mouth daily before breakfast.   lipase/protease/amylase 36000 UNITS Cpep capsule Commonly known as: Creon Take 2 capsules (72,000 Units total) by mouth 3 (three) times daily before meals.   oxyCODONE-acetaminophen 5-325 MG tablet Commonly known as: PERCOCET/ROXICET Take 1 tablet by mouth every 6 (six) hours as needed for severe pain.   pantoprazole 40 MG tablet Commonly known as: PROTONIX TAKE 1 TABLET BY MOUTH TWICE A DAY   potassium chloride SA 20 MEQ tablet Commonly known as: Klor-Con M20 Take 2 tablets (40 mEq total) by mouth daily.   tamsulosin 0.4 MG Caps capsule Commonly known as: FLOMAX Take 0.4 mg by mouth daily.   zolpidem 5 MG tablet Commonly known as: AMBIEN Take 1 tablet (5 mg total) by mouth at bedtime as needed for sleep.       Allergies: No Known Allergies  Past Medical History, Surgical history, Social history, and Family History were reviewed and updated.  Review of Systems: Review of Systems  Constitutional: Negative.   HENT: Negative.   Eyes: Negative.  Respiratory: Negative.   Cardiovascular: Negative.   Gastrointestinal: Negative.   Genitourinary: Negative.   Musculoskeletal: Negative.   Skin: Negative.   Neurological: Negative.   Endo/Heme/Allergies: Negative.   Psychiatric/Behavioral: Negative.      Physical Exam:  weight is 185 lb (83.9 kg). His oral temperature is 98.6 F (37 C). His blood pressure is 112/67 and his pulse is 91. His respiration is 17 and oxygen saturation is 100%.   Wt Readings from Last 3 Encounters:  04/20/20 185 lb (83.9 kg)  04/13/20 187 lb (84.8 kg)  03/23/20 193 lb 1.9 oz (87.6 kg)    Physical Exam Vitals reviewed.  HENT:     Head: Normocephalic and  atraumatic.  Eyes:     Pupils: Pupils are equal, round, and reactive to light.  Cardiovascular:     Rate and Rhythm: Normal rate and regular rhythm.     Heart sounds: Normal heart sounds.  Pulmonary:     Effort: Pulmonary effort is normal.     Breath sounds: Normal breath sounds.  Abdominal:     General: Bowel sounds are normal.     Palpations: Abdomen is soft.  Musculoskeletal:        General: No tenderness or deformity. Normal range of motion.     Cervical back: Normal range of motion.  Lymphadenopathy:     Cervical: No cervical adenopathy.  Skin:    General: Skin is warm and dry.     Findings: No erythema or rash.  Neurological:     Mental Status: He is alert and oriented to person, place, and time.  Psychiatric:        Behavior: Behavior normal.        Thought Content: Thought content normal.        Judgment: Judgment normal.       Lab Results  Component Value Date   WBC 2.6 (L) 04/20/2020   HGB 8.6 (L) 04/20/2020   HCT 24.9 (L) 04/20/2020   MCV 102.9 (H) 04/20/2020   PLT 109 (L) 04/20/2020   Lab Results  Component Value Date   FERRITIN 1,254 (H) 04/13/2020   IRON 92 04/13/2020   TIBC 231 04/13/2020   UIBC 139 04/13/2020   IRONPCTSAT 40 04/13/2020   Lab Results  Component Value Date   RETICCTPCT 5.2 (H) 04/13/2020   RBC 2.42 (L) 04/20/2020   No results found for: KPAFRELGTCHN, LAMBDASER, KAPLAMBRATIO No results found for: IGGSERUM, IGA, IGMSERUM No results found for: TOTALPROTELP, ALBUMINELP, A1GS, A2GS, BETS, BETA2SER, GAMS, MSPIKE, SPEI   Chemistry      Component Value Date/Time   NA 134 (L) 04/13/2020 0912   K 4.0 04/13/2020 0912   CL 100 04/13/2020 0912   CO2 27 04/13/2020 0912   BUN 9 04/13/2020 0912   CREATININE 0.52 (L) 04/13/2020 0912      Component Value Date/Time   CALCIUM 8.8 (L) 04/13/2020 0912   ALKPHOS 191 (H) 04/13/2020 0912   AST 35 04/13/2020 0912   ALT 25 04/13/2020 0912   BILITOT 0.4 04/13/2020 0912       Impression and  Plan: Jeffrey Harris is a nice 64 yo white male with metastatic pancreatic cancer.  He is on second line therapy with Gemzar/Abraxane.  His CA 19-9 have been coming down nicely.  After a little bit of a delay in treatment, the level went back up.  Now it is coming back down.  This is his day 8 of the 3rd cycle of treatment.  We will  go ahead and plan for follow-up CT scan after this treatment.  Again he will need some Neulasta or maybe Neupogen after treatment today.  I am worried about his hemoglobin.  He is borderline with a hemoglobin 8.6.  I will try to hold off on transfusing him.  His erythropoietin level is 81.  We could certainly try all ESA on him.  I told him if he runs into problems with being fatigued and weak and short of breath to let us know and we can intervene with either transfusion or ESA injection.  I just want to make sure that his quality of life is doing well.  He wants to be active.  He wants to be able to play golf with his buddies.  This is very important for him.  Again, we will see how everything looks with the CT scan.  We will get this in about 2 weeks.  We will give him extra week off just to be able to recover. ee him sooner if needed.   Volanda Napoleon, MD 8/18/20219:06 AM

## 2020-04-21 ENCOUNTER — Encounter: Payer: Self-pay | Admitting: *Deleted

## 2020-04-21 ENCOUNTER — Telehealth: Payer: Self-pay | Admitting: Hematology & Oncology

## 2020-04-21 ENCOUNTER — Inpatient Hospital Stay: Payer: BC Managed Care – PPO

## 2020-04-21 VITALS — BP 98/61 | HR 82 | Temp 98.2°F | Resp 17

## 2020-04-21 DIAGNOSIS — D701 Agranulocytosis secondary to cancer chemotherapy: Secondary | ICD-10-CM | POA: Diagnosis not present

## 2020-04-21 DIAGNOSIS — C259 Malignant neoplasm of pancreas, unspecified: Secondary | ICD-10-CM

## 2020-04-21 DIAGNOSIS — C787 Secondary malignant neoplasm of liver and intrahepatic bile duct: Secondary | ICD-10-CM

## 2020-04-21 DIAGNOSIS — Z79899 Other long term (current) drug therapy: Secondary | ICD-10-CM | POA: Diagnosis not present

## 2020-04-21 DIAGNOSIS — Z5111 Encounter for antineoplastic chemotherapy: Secondary | ICD-10-CM | POA: Diagnosis not present

## 2020-04-21 DIAGNOSIS — R978 Other abnormal tumor markers: Secondary | ICD-10-CM | POA: Diagnosis not present

## 2020-04-21 DIAGNOSIS — C250 Malignant neoplasm of head of pancreas: Secondary | ICD-10-CM | POA: Diagnosis not present

## 2020-04-21 DIAGNOSIS — G620 Drug-induced polyneuropathy: Secondary | ICD-10-CM | POA: Diagnosis not present

## 2020-04-21 LAB — CANCER ANTIGEN 19-9: CA 19-9: 1277 U/mL — ABNORMAL HIGH (ref 0–35)

## 2020-04-21 MED ORDER — DARBEPOETIN ALFA 300 MCG/0.6ML IJ SOSY
PREFILLED_SYRINGE | INTRAMUSCULAR | Status: AC
  Filled 2020-04-21: qty 0.6

## 2020-04-21 MED ORDER — DENOSUMAB 120 MG/1.7ML ~~LOC~~ SOLN
SUBCUTANEOUS | Status: AC
  Filled 2020-04-21: qty 1.7

## 2020-04-21 MED ORDER — PEGFILGRASTIM-JMDB 6 MG/0.6ML ~~LOC~~ SOSY
6.0000 mg | PREFILLED_SYRINGE | Freq: Once | SUBCUTANEOUS | Status: AC
  Administered 2020-04-21: 6 mg via SUBCUTANEOUS

## 2020-04-21 MED ORDER — CYANOCOBALAMIN 1000 MCG/ML IJ SOLN
INTRAMUSCULAR | Status: AC
  Filled 2020-04-21: qty 1

## 2020-04-21 MED ORDER — PEGFILGRASTIM-JMDB 6 MG/0.6ML ~~LOC~~ SOSY
PREFILLED_SYRINGE | SUBCUTANEOUS | Status: AC
  Filled 2020-04-21: qty 1.2

## 2020-04-21 MED ORDER — EPOETIN ALFA-EPBX 40000 UNIT/ML IJ SOLN
INTRAMUSCULAR | Status: AC
  Filled 2020-04-21: qty 1

## 2020-04-21 NOTE — Telephone Encounter (Signed)
No los 8/18

## 2020-04-21 NOTE — Patient Instructions (Signed)

## 2020-04-23 ENCOUNTER — Other Ambulatory Visit: Payer: Self-pay | Admitting: Hematology & Oncology

## 2020-04-23 DIAGNOSIS — C787 Secondary malignant neoplasm of liver and intrahepatic bile duct: Secondary | ICD-10-CM

## 2020-04-23 DIAGNOSIS — E876 Hypokalemia: Secondary | ICD-10-CM

## 2020-04-25 ENCOUNTER — Encounter: Payer: Self-pay | Admitting: *Deleted

## 2020-04-27 ENCOUNTER — Encounter: Payer: Self-pay | Admitting: *Deleted

## 2020-04-28 ENCOUNTER — Inpatient Hospital Stay: Payer: BC Managed Care – PPO

## 2020-04-28 ENCOUNTER — Other Ambulatory Visit: Payer: Self-pay | Admitting: *Deleted

## 2020-04-28 ENCOUNTER — Other Ambulatory Visit: Payer: Self-pay

## 2020-04-28 ENCOUNTER — Other Ambulatory Visit: Payer: Self-pay | Admitting: Family

## 2020-04-28 VITALS — BP 92/59 | HR 70 | Resp 17

## 2020-04-28 DIAGNOSIS — Z79899 Other long term (current) drug therapy: Secondary | ICD-10-CM | POA: Diagnosis not present

## 2020-04-28 DIAGNOSIS — Z95828 Presence of other vascular implants and grafts: Secondary | ICD-10-CM

## 2020-04-28 DIAGNOSIS — C787 Secondary malignant neoplasm of liver and intrahepatic bile duct: Secondary | ICD-10-CM

## 2020-04-28 DIAGNOSIS — D649 Anemia, unspecified: Secondary | ICD-10-CM

## 2020-04-28 DIAGNOSIS — C250 Malignant neoplasm of head of pancreas: Secondary | ICD-10-CM | POA: Diagnosis not present

## 2020-04-28 DIAGNOSIS — Z5111 Encounter for antineoplastic chemotherapy: Secondary | ICD-10-CM | POA: Diagnosis not present

## 2020-04-28 DIAGNOSIS — D701 Agranulocytosis secondary to cancer chemotherapy: Secondary | ICD-10-CM | POA: Diagnosis not present

## 2020-04-28 DIAGNOSIS — C259 Malignant neoplasm of pancreas, unspecified: Secondary | ICD-10-CM

## 2020-04-28 DIAGNOSIS — R978 Other abnormal tumor markers: Secondary | ICD-10-CM | POA: Diagnosis not present

## 2020-04-28 DIAGNOSIS — G620 Drug-induced polyneuropathy: Secondary | ICD-10-CM | POA: Diagnosis not present

## 2020-04-28 LAB — SAVE SMEAR(SSMR), FOR PROVIDER SLIDE REVIEW

## 2020-04-28 LAB — CBC WITH DIFFERENTIAL (CANCER CENTER ONLY)
Abs Immature Granulocytes: 0.91 10*3/uL — ABNORMAL HIGH (ref 0.00–0.07)
Basophils Absolute: 0.1 10*3/uL (ref 0.0–0.1)
Basophils Relative: 1 %
Eosinophils Absolute: 0.2 10*3/uL (ref 0.0–0.5)
Eosinophils Relative: 3 %
HCT: 23.6 % — ABNORMAL LOW (ref 39.0–52.0)
Hemoglobin: 8.2 g/dL — ABNORMAL LOW (ref 13.0–17.0)
Immature Granulocytes: 10 %
Lymphocytes Relative: 17 %
Lymphs Abs: 1.6 10*3/uL (ref 0.7–4.0)
MCH: 36 pg — ABNORMAL HIGH (ref 26.0–34.0)
MCHC: 34.7 g/dL (ref 30.0–36.0)
MCV: 103.5 fL — ABNORMAL HIGH (ref 80.0–100.0)
Monocytes Absolute: 1.3 10*3/uL — ABNORMAL HIGH (ref 0.1–1.0)
Monocytes Relative: 14 %
Neutro Abs: 5.3 10*3/uL (ref 1.7–7.7)
Neutrophils Relative %: 55 %
Platelet Count: 47 10*3/uL — ABNORMAL LOW (ref 150–400)
RBC: 2.28 MIL/uL — ABNORMAL LOW (ref 4.22–5.81)
RDW: 15 % (ref 11.5–15.5)
WBC Count: 9.4 10*3/uL (ref 4.0–10.5)
nRBC: 3.4 % — ABNORMAL HIGH (ref 0.0–0.2)

## 2020-04-28 LAB — CMP (CANCER CENTER ONLY)
ALT: 33 U/L (ref 0–44)
AST: 47 U/L — ABNORMAL HIGH (ref 15–41)
Albumin: 3.5 g/dL (ref 3.5–5.0)
Alkaline Phosphatase: 249 U/L — ABNORMAL HIGH (ref 38–126)
Anion gap: 7 (ref 5–15)
BUN: 5 mg/dL — ABNORMAL LOW (ref 8–23)
CO2: 28 mmol/L (ref 22–32)
Calcium: 8.9 mg/dL (ref 8.9–10.3)
Chloride: 96 mmol/L — ABNORMAL LOW (ref 98–111)
Creatinine: 0.62 mg/dL (ref 0.61–1.24)
GFR, Est AFR Am: >60 mL/min (ref >60)
GFR, Estimated: >60 mL/min (ref >60)
Glucose, Bld: 155 mg/dL — ABNORMAL HIGH (ref 70–99)
Potassium: 3.4 mmol/L — ABNORMAL LOW (ref 3.5–5.1)
Sodium: 131 mmol/L — ABNORMAL LOW (ref 135–145)
Total Bilirubin: 0.5 mg/dL (ref 0.3–1.2)
Total Protein: 6.4 g/dL — ABNORMAL LOW (ref 6.5–8.1)

## 2020-04-28 LAB — PREPARE RBC (CROSSMATCH)

## 2020-04-28 MED ORDER — HEPARIN SOD (PORK) LOCK FLUSH 100 UNIT/ML IV SOLN
500.0000 [IU] | Freq: Once | INTRAVENOUS | Status: AC
  Administered 2020-04-28: 500 [IU] via INTRAVENOUS
  Filled 2020-04-28: qty 5

## 2020-04-28 MED ORDER — SODIUM CHLORIDE 0.9 % IV SOLN
INTRAVENOUS | Status: DC
  Filled 2020-04-28 (×2): qty 250

## 2020-04-28 MED ORDER — SODIUM CHLORIDE 0.9% FLUSH
10.0000 mL | INTRAVENOUS | Status: DC | PRN
  Administered 2020-04-28: 10 mL via INTRAVENOUS
  Filled 2020-04-28: qty 10

## 2020-04-28 NOTE — Patient Instructions (Signed)

## 2020-04-29 ENCOUNTER — Inpatient Hospital Stay: Payer: BC Managed Care – PPO

## 2020-04-29 DIAGNOSIS — G620 Drug-induced polyneuropathy: Secondary | ICD-10-CM | POA: Diagnosis not present

## 2020-04-29 DIAGNOSIS — Z5111 Encounter for antineoplastic chemotherapy: Secondary | ICD-10-CM | POA: Diagnosis not present

## 2020-04-29 DIAGNOSIS — D649 Anemia, unspecified: Secondary | ICD-10-CM

## 2020-04-29 DIAGNOSIS — C259 Malignant neoplasm of pancreas, unspecified: Secondary | ICD-10-CM

## 2020-04-29 DIAGNOSIS — C250 Malignant neoplasm of head of pancreas: Secondary | ICD-10-CM | POA: Diagnosis not present

## 2020-04-29 DIAGNOSIS — R978 Other abnormal tumor markers: Secondary | ICD-10-CM | POA: Diagnosis not present

## 2020-04-29 DIAGNOSIS — C787 Secondary malignant neoplasm of liver and intrahepatic bile duct: Secondary | ICD-10-CM

## 2020-04-29 DIAGNOSIS — D701 Agranulocytosis secondary to cancer chemotherapy: Secondary | ICD-10-CM | POA: Diagnosis not present

## 2020-04-29 DIAGNOSIS — Z79899 Other long term (current) drug therapy: Secondary | ICD-10-CM | POA: Diagnosis not present

## 2020-04-29 LAB — CANCER ANTIGEN 19-9: CA 19-9: 1156 U/mL — ABNORMAL HIGH (ref 0–35)

## 2020-04-29 MED ORDER — SODIUM CHLORIDE 0.9% IV SOLUTION
250.0000 mL | Freq: Once | INTRAVENOUS | Status: AC
  Administered 2020-04-29: 250 mL via INTRAVENOUS
  Filled 2020-04-29: qty 250

## 2020-04-29 MED ORDER — HEPARIN SOD (PORK) LOCK FLUSH 100 UNIT/ML IV SOLN
250.0000 [IU] | INTRAVENOUS | Status: DC | PRN
  Filled 2020-04-29: qty 5

## 2020-04-29 MED ORDER — SODIUM CHLORIDE 0.9% FLUSH
3.0000 mL | INTRAVENOUS | Status: DC | PRN
  Filled 2020-04-29: qty 10

## 2020-04-29 MED ORDER — DIPHENHYDRAMINE HCL 25 MG PO CAPS
ORAL_CAPSULE | ORAL | Status: AC
  Filled 2020-04-29: qty 2

## 2020-04-29 MED ORDER — HEPARIN SOD (PORK) LOCK FLUSH 100 UNIT/ML IV SOLN
500.0000 [IU] | Freq: Every day | INTRAVENOUS | Status: AC | PRN
  Administered 2020-04-29: 500 [IU]
  Filled 2020-04-29: qty 5

## 2020-04-29 MED ORDER — SODIUM CHLORIDE 0.9% FLUSH
10.0000 mL | INTRAVENOUS | Status: AC | PRN
  Administered 2020-04-29: 10 mL
  Filled 2020-04-29: qty 10

## 2020-04-29 MED ORDER — ACETAMINOPHEN 325 MG PO TABS
ORAL_TABLET | ORAL | Status: AC
  Filled 2020-04-29: qty 2

## 2020-04-29 MED ORDER — ACETAMINOPHEN 325 MG PO TABS
650.0000 mg | ORAL_TABLET | Freq: Once | ORAL | Status: AC
  Administered 2020-04-29: 650 mg via ORAL

## 2020-04-29 MED ORDER — DIPHENHYDRAMINE HCL 25 MG PO CAPS
25.0000 mg | ORAL_CAPSULE | Freq: Once | ORAL | Status: AC
  Administered 2020-04-29: 25 mg via ORAL

## 2020-04-30 LAB — TYPE AND SCREEN
ABO/RH(D): A NEG
Antibody Screen: NEGATIVE
Unit division: 0
Unit division: 0

## 2020-04-30 LAB — BPAM RBC
Blood Product Expiration Date: 202109182359
Blood Product Expiration Date: 202109262359
ISSUE DATE / TIME: 202108270811
ISSUE DATE / TIME: 202108270811
Unit Type and Rh: 600
Unit Type and Rh: 600

## 2020-05-01 ENCOUNTER — Encounter: Payer: Self-pay | Admitting: *Deleted

## 2020-05-02 ENCOUNTER — Encounter: Payer: Self-pay | Admitting: *Deleted

## 2020-05-03 ENCOUNTER — Ambulatory Visit
Admission: RE | Admit: 2020-05-03 | Discharge: 2020-05-03 | Disposition: A | Discharge status: Home / Self Care | Payer: BC Managed Care – PPO | Source: Ambulatory Visit | Attending: Hematology & Oncology | Admitting: Hematology & Oncology

## 2020-05-03 ENCOUNTER — Other Ambulatory Visit: Payer: Self-pay | Admitting: Hematology & Oncology

## 2020-05-03 ENCOUNTER — Encounter: Payer: Self-pay | Admitting: *Deleted

## 2020-05-03 ENCOUNTER — Other Ambulatory Visit: Payer: Self-pay

## 2020-05-03 DIAGNOSIS — J90 Pleural effusion, not elsewhere classified: Secondary | ICD-10-CM | POA: Diagnosis not present

## 2020-05-03 DIAGNOSIS — K8689 Other specified diseases of pancreas: Secondary | ICD-10-CM | POA: Diagnosis not present

## 2020-05-03 DIAGNOSIS — D3500 Benign neoplasm of unspecified adrenal gland: Secondary | ICD-10-CM | POA: Diagnosis not present

## 2020-05-03 DIAGNOSIS — Q630 Accessory kidney: Secondary | ICD-10-CM | POA: Diagnosis not present

## 2020-05-03 DIAGNOSIS — C787 Secondary malignant neoplasm of liver and intrahepatic bile duct: Secondary | ICD-10-CM | POA: Diagnosis not present

## 2020-05-03 DIAGNOSIS — M954 Acquired deformity of chest and rib: Secondary | ICD-10-CM | POA: Diagnosis not present

## 2020-05-03 DIAGNOSIS — C259 Malignant neoplasm of pancreas, unspecified: Secondary | ICD-10-CM | POA: Diagnosis not present

## 2020-05-03 MED ORDER — IOHEXOL 300 MG/ML  SOLN
100.0000 mL | Freq: Once | INTRAMUSCULAR | Status: AC | PRN
  Administered 2020-05-03: 100 mL via INTRAVENOUS

## 2020-05-04 ENCOUNTER — Other Ambulatory Visit: Payer: Self-pay | Admitting: Hematology & Oncology

## 2020-05-04 DIAGNOSIS — C259 Malignant neoplasm of pancreas, unspecified: Secondary | ICD-10-CM

## 2020-05-04 DIAGNOSIS — C787 Secondary malignant neoplasm of liver and intrahepatic bile duct: Secondary | ICD-10-CM

## 2020-05-05 DIAGNOSIS — H04123 Dry eye syndrome of bilateral lacrimal glands: Secondary | ICD-10-CM | POA: Diagnosis not present

## 2020-05-05 DIAGNOSIS — H2513 Age-related nuclear cataract, bilateral: Secondary | ICD-10-CM | POA: Diagnosis not present

## 2020-05-05 DIAGNOSIS — H179 Unspecified corneal scar and opacity: Secondary | ICD-10-CM | POA: Diagnosis not present

## 2020-05-05 DIAGNOSIS — H1840 Unspecified corneal degeneration: Secondary | ICD-10-CM | POA: Diagnosis not present

## 2020-05-06 ENCOUNTER — Ambulatory Visit (HOSPITAL_COMMUNITY)
Admission: RE | Admit: 2020-05-06 | Discharge: 2020-05-06 | Disposition: A | Discharge status: Home / Self Care | Payer: BC Managed Care – PPO | Source: Ambulatory Visit | Attending: Hematology & Oncology | Admitting: Hematology & Oncology

## 2020-05-06 ENCOUNTER — Other Ambulatory Visit: Payer: Self-pay

## 2020-05-06 DIAGNOSIS — R180 Malignant ascites: Secondary | ICD-10-CM | POA: Diagnosis not present

## 2020-05-06 DIAGNOSIS — C801 Malignant (primary) neoplasm, unspecified: Secondary | ICD-10-CM | POA: Diagnosis not present

## 2020-05-06 DIAGNOSIS — C259 Malignant neoplasm of pancreas, unspecified: Secondary | ICD-10-CM | POA: Diagnosis not present

## 2020-05-06 DIAGNOSIS — C787 Secondary malignant neoplasm of liver and intrahepatic bile duct: Secondary | ICD-10-CM | POA: Diagnosis not present

## 2020-05-06 DIAGNOSIS — R188 Other ascites: Secondary | ICD-10-CM | POA: Diagnosis not present

## 2020-05-06 MED ORDER — LIDOCAINE HCL 1 % IJ SOLN
INTRAMUSCULAR | Status: AC
  Filled 2020-05-06: qty 20

## 2020-05-06 NOTE — Procedures (Signed)
Ultrasound-guided diagnostic and therapeutic paracentesis performed yielding 1.5 liters of yellow fluid. No immediate complications. A portion of the fluid was sent to the lab for cytology. EBL< 1 cc.

## 2020-05-10 ENCOUNTER — Inpatient Hospital Stay (HOSPITAL_BASED_OUTPATIENT_CLINIC_OR_DEPARTMENT_OTHER): Payer: BC Managed Care – PPO | Admitting: Hematology & Oncology

## 2020-05-10 ENCOUNTER — Other Ambulatory Visit: Payer: Self-pay

## 2020-05-10 ENCOUNTER — Inpatient Hospital Stay: Payer: BC Managed Care – PPO

## 2020-05-10 ENCOUNTER — Other Ambulatory Visit: Payer: Self-pay | Admitting: *Deleted

## 2020-05-10 ENCOUNTER — Inpatient Hospital Stay: Payer: BC Managed Care – PPO | Attending: Hematology & Oncology

## 2020-05-10 ENCOUNTER — Telehealth: Payer: Self-pay | Admitting: Hematology & Oncology

## 2020-05-10 ENCOUNTER — Encounter: Payer: Self-pay | Admitting: Hematology & Oncology

## 2020-05-10 VITALS — BP 95/74 | HR 100 | Temp 98.1°F | Resp 18 | Ht 71.0 in | Wt 188.1 lb

## 2020-05-10 DIAGNOSIS — D6481 Anemia due to antineoplastic chemotherapy: Secondary | ICD-10-CM | POA: Diagnosis not present

## 2020-05-10 DIAGNOSIS — C250 Malignant neoplasm of head of pancreas: Secondary | ICD-10-CM | POA: Diagnosis not present

## 2020-05-10 DIAGNOSIS — C787 Secondary malignant neoplasm of liver and intrahepatic bile duct: Secondary | ICD-10-CM | POA: Diagnosis not present

## 2020-05-10 DIAGNOSIS — C259 Malignant neoplasm of pancreas, unspecified: Secondary | ICD-10-CM

## 2020-05-10 DIAGNOSIS — K5900 Constipation, unspecified: Secondary | ICD-10-CM | POA: Diagnosis not present

## 2020-05-10 DIAGNOSIS — G620 Drug-induced polyneuropathy: Secondary | ICD-10-CM | POA: Diagnosis not present

## 2020-05-10 DIAGNOSIS — Z79899 Other long term (current) drug therapy: Secondary | ICD-10-CM | POA: Diagnosis not present

## 2020-05-10 DIAGNOSIS — Z5111 Encounter for antineoplastic chemotherapy: Secondary | ICD-10-CM | POA: Diagnosis not present

## 2020-05-10 DIAGNOSIS — T451X5S Adverse effect of antineoplastic and immunosuppressive drugs, sequela: Secondary | ICD-10-CM | POA: Diagnosis not present

## 2020-05-10 DIAGNOSIS — G629 Polyneuropathy, unspecified: Secondary | ICD-10-CM

## 2020-05-10 DIAGNOSIS — R188 Other ascites: Secondary | ICD-10-CM | POA: Diagnosis not present

## 2020-05-10 LAB — CMP (CANCER CENTER ONLY)
ALT: 24 U/L (ref 0–44)
AST: 34 U/L (ref 15–41)
Albumin: 3.1 g/dL — ABNORMAL LOW (ref 3.5–5.0)
Alkaline Phosphatase: 226 U/L — ABNORMAL HIGH (ref 38–126)
Anion gap: 7 (ref 5–15)
BUN: 8 mg/dL (ref 8–23)
CO2: 28 mmol/L (ref 22–32)
Calcium: 8.7 mg/dL — ABNORMAL LOW (ref 8.9–10.3)
Chloride: 100 mmol/L (ref 98–111)
Creatinine: 0.59 mg/dL — ABNORMAL LOW (ref 0.61–1.24)
GFR, Est AFR Am: >60 mL/min (ref >60)
GFR, Estimated: >60 mL/min (ref >60)
Glucose, Bld: 109 mg/dL — ABNORMAL HIGH (ref 70–99)
Potassium: 3.8 mmol/L (ref 3.5–5.1)
Sodium: 135 mmol/L (ref 135–145)
Total Bilirubin: 0.7 mg/dL (ref 0.3–1.2)
Total Protein: 6.1 g/dL — ABNORMAL LOW (ref 6.5–8.1)

## 2020-05-10 LAB — CBC WITH DIFFERENTIAL (CANCER CENTER ONLY)
Abs Immature Granulocytes: 0.04 10*3/uL (ref 0.00–0.07)
Basophils Absolute: 0.1 10*3/uL (ref 0.0–0.1)
Basophils Relative: 1 %
Eosinophils Absolute: 0.1 10*3/uL (ref 0.0–0.5)
Eosinophils Relative: 2 %
HCT: 36.8 % — ABNORMAL LOW (ref 39.0–52.0)
Hemoglobin: 12.7 g/dL — ABNORMAL LOW (ref 13.0–17.0)
Immature Granulocytes: 1 %
Lymphocytes Relative: 19 %
Lymphs Abs: 1.1 10*3/uL (ref 0.7–4.0)
MCH: 34.4 pg — ABNORMAL HIGH (ref 26.0–34.0)
MCHC: 34.5 g/dL (ref 30.0–36.0)
MCV: 99.7 fL (ref 80.0–100.0)
Monocytes Absolute: 0.7 10*3/uL (ref 0.1–1.0)
Monocytes Relative: 13 %
Neutro Abs: 3.6 10*3/uL (ref 1.7–7.7)
Neutrophils Relative %: 64 %
Platelet Count: 192 10*3/uL (ref 150–400)
RBC: 3.69 MIL/uL — ABNORMAL LOW (ref 4.22–5.81)
RDW: 18.8 % — ABNORMAL HIGH (ref 11.5–15.5)
WBC Count: 5.7 10*3/uL (ref 4.0–10.5)
nRBC: 0 % (ref 0.0–0.2)

## 2020-05-10 LAB — CYTOLOGY - NON PAP

## 2020-05-10 MED ORDER — LACTULOSE 10 GM/15ML PO SOLN: ORAL | 4 refills | Status: DC

## 2020-05-10 MED ORDER — PROCHLORPERAZINE MALEATE 10 MG PO TABS
ORAL_TABLET | ORAL | Status: AC
  Filled 2020-05-10: qty 1

## 2020-05-10 MED ORDER — SODIUM CHLORIDE 0.9% FLUSH
10.0000 mL | INTRAVENOUS | Status: DC | PRN
  Administered 2020-05-10: 10 mL
  Filled 2020-05-10: qty 10

## 2020-05-10 MED ORDER — PACLITAXEL PROTEIN-BOUND CHEMO INJECTION 100 MG
112.5000 mg/m2 | Freq: Once | INTRAVENOUS | Status: AC
  Administered 2020-05-10: 225 mg via INTRAVENOUS
  Filled 2020-05-10: qty 45

## 2020-05-10 MED ORDER — LACTULOSE 10 GM/15ML PO SOLN: ORAL | 2 refills | Status: DC

## 2020-05-10 MED ORDER — SODIUM CHLORIDE 0.9 % IV SOLN
1600.0000 mg | Freq: Once | INTRAVENOUS | Status: AC
  Administered 2020-05-10: 1600 mg via INTRAVENOUS
  Filled 2020-05-10: qty 42.08

## 2020-05-10 MED ORDER — GABAPENTIN 300 MG PO CAPS: 300.0000 mg | ORAL_CAPSULE | Freq: Three times a day (TID) | ORAL | 6 refills | Status: DC

## 2020-05-10 MED ORDER — PROCHLORPERAZINE MALEATE 10 MG PO TABS
10.0000 mg | ORAL_TABLET | Freq: Once | ORAL | Status: AC
  Administered 2020-05-10: 10 mg via ORAL

## 2020-05-10 MED ORDER — SODIUM CHLORIDE 0.9 % IV SOLN
Freq: Once | INTRAVENOUS | Status: AC
  Filled 2020-05-10: qty 250

## 2020-05-10 MED ORDER — HEPARIN SOD (PORK) LOCK FLUSH 100 UNIT/ML IV SOLN
500.0000 [IU] | Freq: Once | INTRAVENOUS | Status: AC | PRN
  Administered 2020-05-10: 500 [IU]
  Filled 2020-05-10: qty 5

## 2020-05-10 NOTE — Telephone Encounter (Signed)
Appointments scheduled calendar printed per 9/7 los 

## 2020-05-10 NOTE — Patient Instructions (Signed)
[employer] Discharge Instructions for Patients Receiving Chemotherapy  Today you received the following chemotherapy agents Abraxane, Gemzar  To help prevent nausea and vomiting after your treatment, we encourage you to take your nausea medication    If you develop nausea and vomiting that is not controlled by your nausea medication, call the clinic.   BELOW ARE SYMPTOMS THAT SHOULD BE REPORTED IMMEDIATELY:  *FEVER GREATER THAN 100.5 F  *CHILLS WITH OR WITHOUT FEVER  NAUSEA AND VOMITING THAT IS NOT CONTROLLED WITH YOUR NAUSEA MEDICATION  *UNUSUAL SHORTNESS OF BREATH  *UNUSUAL BRUISING OR BLEEDING  TENDERNESS IN MOUTH AND THROAT WITH OR WITHOUT PRESENCE OF ULCERS  *URINARY PROBLEMS  *BOWEL PROBLEMS  UNUSUAL RASH Items with * indicate a potential emergency and should be followed up as soon as possible.  Feel free to call the clinic should you have any questions or concerns. The clinic phone number is [phone].  Please show the [place] at check-in to the Emergency Department and triage nurse.

## 2020-05-10 NOTE — Progress Notes (Addendum)
Hematology and Oncology Follow Up Visit  Jeffrey Harris [id number] [birth year] 64 y.o. 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 Brayman is here today for follow-up and treatment.  We did do a CT scan on him.  This was done last week.  Everything looked quite stable.  However, there was some ascites that he had which was new.  Also noted was some hepatic steatosis.  We did go ahead and get a paracentesis on him.  This was done on 05/06/2020.  1.5 L of fluid was removed.  We do not have any results back on this fluid.  He has had a little bit of time off to help with blood counts.  His blood counts are better.  He probably does need some colony stimulating factor to try to help with his white cells.  His last CA 19-9 was coming back down.  When we last checked, the CA 19-9 was down to her last checked a week ago, the CA 19-I was down to 1160.  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  He is on gabapentin.  He takes 300 mg 3 times a day and then 600 mg at bedtime.  He feels that the neuropathy is holding stable.   He has had no problems with bowels or bladder.  He has had some loose stool.  He has had no problems with cough.  He has had no fever.  He has had a decent appetite.  His weight is up a little bit now.    He has had no leg swelling.  There is been no bleeding or bruising.  He has had no headache.  Overall, his performance status is ECOG 1.     Medications:  Allergies as of 05/10/2020   No Known Allergies     Medication List       Accurate as of May 10, 2020 10:39 AM. If you have any questions, ask your nurse or doctor.        allopurinol 300  MG tablet Commonly known as: ZYLOPRIM Take 300 mg by mouth daily.   diphenoxylate-atropine 2.5-0.025 MG tablet Commonly known as: LOMOTIL Take 1-2 tablets by mouth 4 (four) times daily as needed for diarrhea or loose stools.   gabapentin 300 MG capsule Commonly known as: NEURONTIN TAKE 1 CAPSULE BY MOUTH THREE TIMES A DAY   hydrocortisone 25 MG suppository Commonly known as: ANUSOL-HC PLACE 1 SUPPOSITORY (25 MG TOTAL) RECTALLY 2 (TWO) TIMES DAILY. FOR 7 DAYS   Klor-Con M20 20 MEQ tablet Generic drug: potassium chloride SA TAKE 2 TABLETS BY MOUTH DAILY   lactulose 10 GM/15ML solution Commonly known as: CHRONULAC SMARTSIG:40 Milliliter(s) By Mouth Morning-Evening   linaclotide 145 MCG Caps capsule Commonly known as: Linzess Take 1 capsule (145 mcg total) by mouth daily before breakfast.   lipase/protease/amylase 36000 UNITS Cpep capsule Commonly known as: Creon Take 2 capsules (72,000 Units total) by mouth 3 (three) times daily before meals.   oxyCODONE-acetaminophen 5-325 MG tablet Commonly known as: PERCOCET/ROXICET Take 1 tablet by mouth every 6 (six) hours as needed for severe pain.   pantoprazole 40 MG tablet Commonly known as: PROTONIX TAKE 1 TABLET BY MOUTH  TWICE A DAY   tamsulosin 0.4 MG Caps capsule Commonly known as: FLOMAX Take 0.4 mg by mouth daily.   zolpidem 5 MG tablet Commonly known as: AMBIEN Take 1 tablet (5 mg total) by mouth at bedtime as needed for sleep.       Allergies: No Known Allergies  Past Medical History, Surgical history, Social history, and Family History were reviewed and updated.  Review of Systems: Review of Systems  Constitutional: Negative.   HENT: Negative.   Eyes: Negative.   Respiratory: Negative.   Cardiovascular: Negative.   Gastrointestinal: Negative.   Genitourinary: Negative.   Musculoskeletal: Negative.   Skin: Negative.   Neurological: Negative.   Endo/Heme/Allergies: Negative.   Psychiatric/Behavioral:  Negative.      Physical Exam:  height is 5\' 11"  (1.803 m) and weight is 188 lb 1.9 oz (85.3 kg). His oral temperature is 98.1 F (36.7 C). His blood pressure is 95/74 and his pulse is 100. His respiration is 18 and oxygen saturation is 100%.   Wt Readings from Last 3 Encounters:  05/10/20 188 lb 1.9 oz (85.3 kg)  04/20/20 185 lb (83.9 kg)  04/13/20 187 lb (84.8 kg)    Physical Exam Vitals reviewed.  HENT:     Head: Normocephalic and atraumatic.  Eyes:     Pupils: Pupils are equal, round, and reactive to light.  Cardiovascular:     Rate and Rhythm: Normal rate and regular rhythm.     Heart sounds: Normal heart sounds.  Pulmonary:     Effort: Pulmonary effort is normal.     Breath sounds: Normal breath sounds.  Abdominal:     General: Bowel sounds are normal.     Palpations: Abdomen is soft.  Musculoskeletal:        General: No tenderness or deformity. Normal range of motion.     Cervical back: Normal range of motion.  Lymphadenopathy:     Cervical: No cervical adenopathy.  Skin:    General: Skin is warm and dry.     Findings: No erythema or rash.  Neurological:     Mental Status: He is alert and oriented to person, place, and time.  Psychiatric:        Behavior: Behavior normal.        Thought Content: Thought content normal.        Judgment: Judgment normal.       Lab Results  Component Value Date   WBC 5.7 05/10/2020   HGB 12.7 (L) 05/10/2020   HCT 36.8 (L) 05/10/2020   MCV 99.7 05/10/2020   PLT 192 05/10/2020   Lab Results  Component Value Date   FERRITIN 1,254 (H) 04/13/2020   IRON 92 04/13/2020   TIBC 231 04/13/2020   UIBC 139 04/13/2020   IRONPCTSAT 40 04/13/2020   Lab Results  Component Value Date   RETICCTPCT 5.2 (H) 04/13/2020   RBC 3.69 (L) 05/10/2020   No results found for: KPAFRELGTCHN, LAMBDASER, KAPLAMBRATIO No results found for: IGGSERUM, IGA, IGMSERUM No results found for: Ronnald Ramp, A1GS, A2GS, Violet Baldy,  MSPIKE, SPEI   Chemistry      Component Value Date/Time   NA 135 05/10/2020 1001   K 3.8 05/10/2020 1001   CL 100 05/10/2020 1001   CO2 28 05/10/2020 1001   BUN 8 05/10/2020 1001   CREATININE 0.59 (L) 05/10/2020 1001      Component Value Date/Time   CALCIUM 8.7 (L) 05/10/2020 1001   ALKPHOS 226 (H) 05/10/2020 1001  AST 34 05/10/2020 1001   ALT 24 05/10/2020 1001   BILITOT 0.7 05/10/2020 1001       Impression and Plan: Mr. Krammes is a nice 64 yo white male with metastatic pancreatic cancer.  He is on second line therapy with Gemzar/Abraxane.  His CA 19-9 have been coming down nicely.    Of note, we did have to transfuse him about a week or so ago.  I felt that this would help with his fatigue and weakness.  The transfusion did help.  I think he got 2 units of blood.   We will proceed with his 4th cycle of chemotherapy.  I did give him extra week off between cycles.   Volanda Napoleon, MD 9/7/202110:39 AM   ADDENDUM: Unfortunately, the pathology on the ascites did come back positive for malignancy.  I think this is evidence of progressive disease.  We are sending the fluid off for molecular studies to see if there might be a targeted mutation that we might be able to take advantage of.  Since there really is a low likelihood of a actionable marker, I think that we probably will have to use third line therapy.  Third line therapy, since his performance status is still good, would be infusional 5-FU with liposomal irinotecan.  I think this would be reasonable.  I spoke with Mr. Laursen on the phone.  He understands the situation.  He agrees to proceed with the next line of therapy.  He still wants to try to do what he can.  We will try to get his treatment set up for next week.  I just hate that he is progressing.  He is doing everything we asked him to do.  He still has been quite active.  He still wants to maintain a good quality of life.  Lattie Haw, MD

## 2020-05-10 NOTE — Addendum Note (Signed)
Addended by: Burney Gauze R on: 05/10/2020 10:59 AM   Modules accepted: Orders

## 2020-05-10 NOTE — Patient Instructions (Signed)
Implanted Port Insertion, Care After °This sheet gives you information about how to care for yourself after your procedure. Your health care provider may also give you more specific instructions. If you have problems or questions, contact your health care provider. °What can I expect after the procedure? °After the procedure, it is common to have: °· Discomfort at the port insertion site. °· Bruising on the skin over the port. This should improve over 3-4 days. °Follow these instructions at home: °Port care °· After your port is placed, you will get a manufacturer's information card. The card has information about your port. Keep this card with you at all times. °· Take care of the port as told by your health care provider. Ask your health care provider if you or a family member can get training for taking care of the port at home. A home health care nurse may also take care of the port. °· Make sure to remember what type of port you have. °Incision care ° °  ° °· Follow instructions from your health care provider about how to take care of your port insertion site. Make sure you: °? Wash your hands with soap and water before and after you change your bandage (dressing). If soap and water are not available, use hand sanitizer. °? Change your dressing as told by your health care provider. °? Leave stitches (sutures), skin glue, or adhesive strips in place. These skin closures may need to stay in place for 2 weeks or longer. If adhesive strip edges start to loosen and curl up, you may trim the loose edges. Do not remove adhesive strips completely unless your health care provider tells you to do that. °· Check your port insertion site every day for signs of infection. Check for: °? Redness, swelling, or pain. °? Fluid or blood. °? Warmth. °? Pus or a bad smell. °Activity °· Return to your normal activities as told by your health care provider. Ask your health care provider what activities are safe for you. °· Do not  lift anything that is heavier than 10 lb (4.5 kg), or the limit that you are told, until your health care provider says that it is safe. °General instructions °· Take over-the-counter and prescription medicines only as told by your health care provider. °· Do not take baths, swim, or use a hot tub until your health care provider approves. Ask your health care provider if you may take showers. You may only be allowed to take sponge baths. °· Do not drive for 24 hours if you were given a sedative during your procedure. °· Wear a medical alert bracelet in case of an emergency. This will tell any health care providers that you have a port. °· Keep all follow-up visits as told by your health care provider. This is important. °Contact a health care provider if: °· You cannot flush your port with saline as directed, or you cannot draw blood from the port. °· You have a fever or chills. °· You have redness, swelling, or pain around your port insertion site. °· You have fluid or blood coming from your port insertion site. °· Your port insertion site feels warm to the touch. °· You have pus or a bad smell coming from the port insertion site. °Get help right away if: °· You have chest pain or shortness of breath. °· You have bleeding from your port that you cannot control. °Summary °· Take care of the port as told by your health   care provider. Keep the manufacturer's information card with you at all times. °· Change your dressing as told by your health care provider. °· Contact a health care provider if you have a fever or chills or if you have redness, swelling, or pain around your port insertion site. °· Keep all follow-up visits as told by your health care provider. °This information is not intended to replace advice given to you by your health care provider. Make sure you discuss any questions you have with your health care provider. °Document Revised: 03/18/2018 Document Reviewed: 03/18/2018 °Elsevier Patient Education ©  2020 [employer] ° °

## 2020-05-11 ENCOUNTER — Inpatient Hospital Stay: Payer: BC Managed Care – PPO

## 2020-05-11 ENCOUNTER — Encounter: Payer: Self-pay | Admitting: *Deleted

## 2020-05-12 ENCOUNTER — Telehealth: Payer: Self-pay

## 2020-05-12 ENCOUNTER — Encounter: Payer: Self-pay | Admitting: *Deleted

## 2020-05-12 NOTE — Telephone Encounter (Signed)
Completed & signed requisition form faxed for [facility] [facility] for peritoneal fluid collected on 05/06/2020. dph

## 2020-05-13 ENCOUNTER — Other Ambulatory Visit: Payer: Self-pay | Admitting: Hematology & Oncology

## 2020-05-13 DIAGNOSIS — C787 Secondary malignant neoplasm of liver and intrahepatic bile duct: Secondary | ICD-10-CM

## 2020-05-13 NOTE — Addendum Note (Signed)
Addended by: Burney Gauze R on: 05/13/2020 02:23 PM   Modules accepted: Orders

## 2020-05-13 NOTE — Progress Notes (Signed)
DISCONTINUE ON PATHWAY REGIMEN - Pancreatic Adenocarcinoma     A cycle is every 28 days:     Nab-paclitaxel (protein bound)      Gemcitabine   **Always confirm dose/schedule in your pharmacy ordering system**  REASON: Disease Progression PRIOR TREATMENT: [id number]: Nab-Paclitaxel (Abraxane) 125 mg/m2 D1, 8, 15 + Gemcitabine 1,000 mg/m2 D1, 8, 15 q28 Days Until Progression or Toxicity TREATMENT RESPONSE: Partial Response (PR)  START OFF PATHWAY REGIMEN - Pancreatic Adenocarcinoma   [id number]:Liposomal Irinotecan 70 mg/m2 + Leucovorin 400 mg/m2 + Fluorouracil 2,400 mg/m2 CIV q14 Days:   A cycle is every 14 days:     Liposomal irinotecan      Leucovorin      Fluorouracil   **Always confirm dose/schedule in your pharmacy ordering system**  Patient Characteristics: Metastatic Disease, Third Line and Beyond, MSS/pMMR or MSI Unknown Therapeutic Status: Metastatic Disease Line of Therapy: Third Engineer, civil (consulting) Status: Unknown Intent of Therapy: Non-Curative / Palliative Intent, Discussed with Patient

## 2020-05-16 ENCOUNTER — Ambulatory Visit (HOSPITAL_COMMUNITY)
Admission: RE | Admit: 2020-05-16 | Discharge: 2020-05-16 | Disposition: A | Discharge status: Home / Self Care | Payer: BC Managed Care – PPO | Source: Ambulatory Visit | Attending: Hematology & Oncology | Admitting: Hematology & Oncology

## 2020-05-16 ENCOUNTER — Other Ambulatory Visit: Payer: Self-pay

## 2020-05-16 ENCOUNTER — Other Ambulatory Visit: Payer: Self-pay | Admitting: *Deleted

## 2020-05-16 ENCOUNTER — Encounter (INDEPENDENT_AMBULATORY_CARE_PROVIDER_SITE_OTHER): Payer: BC Managed Care – PPO | Admitting: Ophthalmology

## 2020-05-16 DIAGNOSIS — C787 Secondary malignant neoplasm of liver and intrahepatic bile duct: Secondary | ICD-10-CM | POA: Insufficient documentation

## 2020-05-16 DIAGNOSIS — C259 Malignant neoplasm of pancreas, unspecified: Secondary | ICD-10-CM | POA: Diagnosis not present

## 2020-05-16 DIAGNOSIS — R180 Malignant ascites: Secondary | ICD-10-CM | POA: Insufficient documentation

## 2020-05-16 DIAGNOSIS — R188 Other ascites: Secondary | ICD-10-CM | POA: Diagnosis not present

## 2020-05-16 HISTORY — PX: IR PARACENTESIS: IMG2679

## 2020-05-16 MED ORDER — LIDOCAINE HCL 1 % IJ SOLN
INTRAMUSCULAR | Status: AC
  Filled 2020-05-16: qty 20

## 2020-05-16 MED ORDER — LIDOCAINE HCL 1 % IJ SOLN
INTRAMUSCULAR | Status: AC | PRN
  Administered 2020-05-16: 10 mL

## 2020-05-16 NOTE — Procedures (Signed)
PROCEDURE SUMMARY:  Successful US guided paracentesis from right abdomen.  Yielded 1L of clear yellow fluid.  No immediate complications.  Pt tolerated well.   EBL < 71mL  Theresa Duty, NP 05/16/2020 3:14 PM

## 2020-05-17 ENCOUNTER — Inpatient Hospital Stay: Payer: BC Managed Care – PPO

## 2020-05-17 ENCOUNTER — Other Ambulatory Visit: Payer: Self-pay | Admitting: Family

## 2020-05-17 ENCOUNTER — Telehealth: Payer: Self-pay | Admitting: *Deleted

## 2020-05-17 ENCOUNTER — Other Ambulatory Visit: Payer: Self-pay | Admitting: *Deleted

## 2020-05-17 ENCOUNTER — Encounter: Payer: Self-pay | Admitting: *Deleted

## 2020-05-17 ENCOUNTER — Ambulatory Visit (HOSPITAL_BASED_OUTPATIENT_CLINIC_OR_DEPARTMENT_OTHER)
Admission: RE | Admit: 2020-05-17 | Discharge: 2020-05-17 | Disposition: A | Discharge status: Home / Self Care | Payer: BC Managed Care – PPO | Source: Ambulatory Visit | Attending: Hematology & Oncology | Admitting: Hematology & Oncology

## 2020-05-17 VITALS — BP 93/59 | HR 60 | Resp 17

## 2020-05-17 VITALS — BP 84/72 | HR 103 | Temp 97.7°F | Resp 20 | Wt 175.0 lb

## 2020-05-17 DIAGNOSIS — T451X5S Adverse effect of antineoplastic and immunosuppressive drugs, sequela: Secondary | ICD-10-CM | POA: Diagnosis not present

## 2020-05-17 DIAGNOSIS — C787 Secondary malignant neoplasm of liver and intrahepatic bile duct: Secondary | ICD-10-CM

## 2020-05-17 DIAGNOSIS — G629 Polyneuropathy, unspecified: Secondary | ICD-10-CM

## 2020-05-17 DIAGNOSIS — D6481 Anemia due to antineoplastic chemotherapy: Secondary | ICD-10-CM | POA: Diagnosis not present

## 2020-05-17 DIAGNOSIS — D649 Anemia, unspecified: Secondary | ICD-10-CM

## 2020-05-17 DIAGNOSIS — R112 Nausea with vomiting, unspecified: Secondary | ICD-10-CM | POA: Diagnosis not present

## 2020-05-17 DIAGNOSIS — Z79899 Other long term (current) drug therapy: Secondary | ICD-10-CM | POA: Diagnosis not present

## 2020-05-17 DIAGNOSIS — C259 Malignant neoplasm of pancreas, unspecified: Secondary | ICD-10-CM

## 2020-05-17 DIAGNOSIS — Z5111 Encounter for antineoplastic chemotherapy: Secondary | ICD-10-CM | POA: Diagnosis not present

## 2020-05-17 DIAGNOSIS — R188 Other ascites: Secondary | ICD-10-CM | POA: Diagnosis not present

## 2020-05-17 DIAGNOSIS — C250 Malignant neoplasm of head of pancreas: Secondary | ICD-10-CM | POA: Diagnosis not present

## 2020-05-17 DIAGNOSIS — G620 Drug-induced polyneuropathy: Secondary | ICD-10-CM | POA: Diagnosis not present

## 2020-05-17 LAB — CBC WITH DIFFERENTIAL (CANCER CENTER ONLY)
Abs Immature Granulocytes: 0.02 10*3/uL (ref 0.00–0.07)
Basophils Absolute: 0 10*3/uL (ref 0.0–0.1)
Basophils Relative: 1 %
Eosinophils Absolute: 0.1 10*3/uL (ref 0.0–0.5)
Eosinophils Relative: 3 %
HCT: 28.5 % — ABNORMAL LOW (ref 39.0–52.0)
Hemoglobin: 9.9 g/dL — ABNORMAL LOW (ref 13.0–17.0)
Immature Granulocytes: 1 %
Lymphocytes Relative: 27 %
Lymphs Abs: 0.7 10*3/uL (ref 0.7–4.0)
MCH: 34.4 pg — ABNORMAL HIGH (ref 26.0–34.0)
MCHC: 34.7 g/dL (ref 30.0–36.0)
MCV: 99 fL (ref 80.0–100.0)
Monocytes Absolute: 0.3 10*3/uL (ref 0.1–1.0)
Monocytes Relative: 10 %
Neutro Abs: 1.6 10*3/uL — ABNORMAL LOW (ref 1.7–7.7)
Neutrophils Relative %: 58 %
Platelet Count: 25 10*3/uL — ABNORMAL LOW (ref 150–400)
RBC: 2.88 MIL/uL — ABNORMAL LOW (ref 4.22–5.81)
RDW: 18 % — ABNORMAL HIGH (ref 11.5–15.5)
WBC Count: 2.6 10*3/uL — ABNORMAL LOW (ref 4.0–10.5)
nRBC: 0 % (ref 0.0–0.2)

## 2020-05-17 LAB — CMP (CANCER CENTER ONLY)
ALT: 27 U/L (ref 0–44)
AST: 48 U/L — ABNORMAL HIGH (ref 15–41)
Albumin: 3.2 g/dL — ABNORMAL LOW (ref 3.5–5.0)
Alkaline Phosphatase: 235 U/L — ABNORMAL HIGH (ref 38–126)
Anion gap: 9 (ref 5–15)
BUN: 10 mg/dL (ref 8–23)
CO2: 35 mmol/L — ABNORMAL HIGH (ref 22–32)
Calcium: 9.2 mg/dL (ref 8.9–10.3)
Chloride: 89 mmol/L — ABNORMAL LOW (ref 98–111)
Creatinine: 0.58 mg/dL — ABNORMAL LOW (ref 0.61–1.24)
GFR, Est AFR Am: >60 mL/min (ref >60)
GFR, Estimated: >60 mL/min (ref >60)
Glucose, Bld: 132 mg/dL — ABNORMAL HIGH (ref 70–99)
Potassium: 3.2 mmol/L — ABNORMAL LOW (ref 3.5–5.1)
Sodium: 133 mmol/L — ABNORMAL LOW (ref 135–145)
Total Bilirubin: 1.2 mg/dL (ref 0.3–1.2)
Total Protein: 6.2 g/dL — ABNORMAL LOW (ref 6.5–8.1)

## 2020-05-17 LAB — PREPARE RBC (CROSSMATCH)

## 2020-05-17 MED ORDER — ONDANSETRON HCL 4 MG/2ML IJ SOLN
INTRAMUSCULAR | Status: AC
  Filled 2020-05-17: qty 4

## 2020-05-17 MED ORDER — SODIUM CHLORIDE 0.9 % IV SOLN
10.0000 mg | Freq: Once | INTRAVENOUS | Status: AC
  Administered 2020-05-17: 10 mg via INTRAVENOUS
  Filled 2020-05-17: qty 10

## 2020-05-17 MED ORDER — SODIUM CHLORIDE 0.9% FLUSH
10.0000 mL | Freq: Once | INTRAVENOUS | Status: AC
  Administered 2020-05-17: 10 mL via INTRAVENOUS
  Filled 2020-05-17: qty 10

## 2020-05-17 MED ORDER — HEPARIN SOD (PORK) LOCK FLUSH 100 UNIT/ML IV SOLN
500.0000 [IU] | Freq: Once | INTRAVENOUS | Status: AC
  Administered 2020-05-17: 500 [IU] via INTRAVENOUS
  Filled 2020-05-17: qty 5

## 2020-05-17 MED ORDER — ONDANSETRON HCL 4 MG/2ML IJ SOLN
8.0000 mg | Freq: Once | INTRAMUSCULAR | Status: AC
  Administered 2020-05-17: 8 mg via INTRAVENOUS

## 2020-05-17 MED ORDER — LORAZEPAM 2 MG/ML IJ SOLN
0.5000 mg | Freq: Once | INTRAMUSCULAR | Status: AC
  Administered 2020-05-17: 0.5 mg via INTRAVENOUS

## 2020-05-17 MED ORDER — LORAZEPAM 2 MG/ML IJ SOLN
INTRAMUSCULAR | Status: AC
  Filled 2020-05-17: qty 1

## 2020-05-17 MED ORDER — SODIUM CHLORIDE 0.9 % IV SOLN
INTRAVENOUS | Status: DC
  Filled 2020-05-17 (×2): qty 250

## 2020-05-17 MED ORDER — SODIUM CHLORIDE 0.9 % IV SOLN
150.0000 mg | Freq: Once | INTRAVENOUS | Status: AC
  Administered 2020-05-17: 150 mg via INTRAVENOUS
  Filled 2020-05-17: qty 5

## 2020-05-17 MED ORDER — ONDANSETRON HCL 8 MG PO TABS: 8.0000 mg | ORAL_TABLET | Freq: Three times a day (TID) | ORAL | 2 refills | Status: DC | PRN

## 2020-05-17 NOTE — Progress Notes (Signed)
Patient in for treatment today, complaining of nausea with vomiting, no diarrhea. Fatigue and weight loss, states he is belching a lot at night. Unable to keep food down and sometimes liquids. States his nausea meds at home are not helping. BP 84/72, informed Dr.Ennever who would like patient to receive 1 liter of fluids, zofran and dex. And go down for abdominal xray. Orders were placed by charge nurse and patient informed. No treatment today as well as platelet count is low.

## 2020-05-17 NOTE — Telephone Encounter (Signed)
Call received from [employer] stating that [place] will not be able to be done on peritoneal fluid d/t "not enough tumor cells to be tested" per [facility] pathology.  Dr. Marin Olp notified.

## 2020-05-17 NOTE — Progress Notes (Unsigned)
Dr. Marin Olp notified of pt.'s complaints of nausea, BP-84/72, weight loss of 13 pounds, platelet count of 25 and abd pain.  Order received to hold pt.'s treatment today, for pt to have abd xray today, 2 hrs of IVF's, IV Zofran and IV Dex today.

## 2020-05-18 ENCOUNTER — Inpatient Hospital Stay: Payer: BC Managed Care – PPO

## 2020-05-18 ENCOUNTER — Other Ambulatory Visit: Payer: Self-pay | Admitting: Family

## 2020-05-18 ENCOUNTER — Other Ambulatory Visit: Payer: Self-pay

## 2020-05-18 DIAGNOSIS — C259 Malignant neoplasm of pancreas, unspecified: Secondary | ICD-10-CM

## 2020-05-18 DIAGNOSIS — Z5111 Encounter for antineoplastic chemotherapy: Secondary | ICD-10-CM | POA: Diagnosis not present

## 2020-05-18 DIAGNOSIS — D649 Anemia, unspecified: Secondary | ICD-10-CM

## 2020-05-18 DIAGNOSIS — C787 Secondary malignant neoplasm of liver and intrahepatic bile duct: Secondary | ICD-10-CM

## 2020-05-18 DIAGNOSIS — Z79899 Other long term (current) drug therapy: Secondary | ICD-10-CM | POA: Diagnosis not present

## 2020-05-18 DIAGNOSIS — D6481 Anemia due to antineoplastic chemotherapy: Secondary | ICD-10-CM | POA: Diagnosis not present

## 2020-05-18 DIAGNOSIS — T451X5S Adverse effect of antineoplastic and immunosuppressive drugs, sequela: Secondary | ICD-10-CM | POA: Diagnosis not present

## 2020-05-18 DIAGNOSIS — G620 Drug-induced polyneuropathy: Secondary | ICD-10-CM | POA: Diagnosis not present

## 2020-05-18 DIAGNOSIS — R188 Other ascites: Secondary | ICD-10-CM | POA: Diagnosis not present

## 2020-05-18 DIAGNOSIS — C250 Malignant neoplasm of head of pancreas: Secondary | ICD-10-CM | POA: Diagnosis not present

## 2020-05-18 LAB — CBC WITH DIFFERENTIAL (CANCER CENTER ONLY)
Abs Immature Granulocytes: 0.03 10*3/uL (ref 0.00–0.07)
Basophils Absolute: 0 10*3/uL (ref 0.0–0.1)
Basophils Relative: 0 %
Eosinophils Absolute: 0 10*3/uL (ref 0.0–0.5)
Eosinophils Relative: 0 %
HCT: 27.3 % — ABNORMAL LOW (ref 39.0–52.0)
Hemoglobin: 9.7 g/dL — ABNORMAL LOW (ref 13.0–17.0)
Immature Granulocytes: 1 %
Lymphocytes Relative: 13 %
Lymphs Abs: 0.5 10*3/uL — ABNORMAL LOW (ref 0.7–4.0)
MCH: 34.9 pg — ABNORMAL HIGH (ref 26.0–34.0)
MCHC: 35.5 g/dL (ref 30.0–36.0)
MCV: 98.2 fL (ref 80.0–100.0)
Monocytes Absolute: 0.3 10*3/uL (ref 0.1–1.0)
Monocytes Relative: 6 %
Neutro Abs: 3.1 10*3/uL (ref 1.7–7.7)
Neutrophils Relative %: 80 %
Platelet Count: 42 10*3/uL — ABNORMAL LOW (ref 150–400)
RBC: 2.78 MIL/uL — ABNORMAL LOW (ref 4.22–5.81)
RDW: 17.7 % — ABNORMAL HIGH (ref 11.5–15.5)
WBC Count: 4 10*3/uL (ref 4.0–10.5)
nRBC: 0 % (ref 0.0–0.2)

## 2020-05-18 MED ORDER — DIPHENHYDRAMINE HCL 25 MG PO CAPS
25.0000 mg | ORAL_CAPSULE | Freq: Once | ORAL | Status: AC
  Administered 2020-05-18: 25 mg via ORAL

## 2020-05-18 MED ORDER — ACETAMINOPHEN 325 MG PO TABS
ORAL_TABLET | ORAL | Status: AC
  Filled 2020-05-18: qty 2

## 2020-05-18 MED ORDER — HEPARIN SOD (PORK) LOCK FLUSH 100 UNIT/ML IV SOLN
500.0000 [IU] | Freq: Once | INTRAVENOUS | Status: AC
  Administered 2020-05-18: 500 [IU] via INTRAVENOUS
  Filled 2020-05-18: qty 5

## 2020-05-18 MED ORDER — DIPHENHYDRAMINE HCL 25 MG PO CAPS
ORAL_CAPSULE | ORAL | Status: AC
  Filled 2020-05-18: qty 1

## 2020-05-18 MED ORDER — SODIUM CHLORIDE 0.9% FLUSH
10.0000 mL | Freq: Once | INTRAVENOUS | Status: AC
  Administered 2020-05-18: 10 mL via INTRAVENOUS
  Filled 2020-05-18: qty 10

## 2020-05-18 MED ORDER — ACETAMINOPHEN 325 MG PO TABS
650.0000 mg | ORAL_TABLET | Freq: Once | ORAL | Status: DC
  Administered 2020-05-18: 650 mg via ORAL

## 2020-05-18 NOTE — Patient Instructions (Signed)

## 2020-05-18 NOTE — Patient Instructions (Signed)

## 2020-05-19 ENCOUNTER — Inpatient Hospital Stay: Payer: BC Managed Care – PPO

## 2020-05-19 LAB — BPAM RBC
Blood Product Expiration Date: 202109282359
Blood Product Expiration Date: 202110102359
ISSUE DATE / TIME: 202109150803
ISSUE DATE / TIME: 202109150803
Unit Type and Rh: 600
Unit Type and Rh: 600

## 2020-05-19 LAB — TYPE AND SCREEN
ABO/RH(D): A NEG
Antibody Screen: NEGATIVE
Unit division: 0
Unit division: 0

## 2020-05-20 ENCOUNTER — Other Ambulatory Visit: Payer: Self-pay | Admitting: Family

## 2020-05-20 ENCOUNTER — Encounter: Payer: Self-pay | Admitting: *Deleted

## 2020-05-20 ENCOUNTER — Telehealth: Payer: Self-pay | Admitting: Hematology & Oncology

## 2020-05-20 DIAGNOSIS — C787 Secondary malignant neoplasm of liver and intrahepatic bile duct: Secondary | ICD-10-CM

## 2020-05-20 DIAGNOSIS — R180 Malignant ascites: Secondary | ICD-10-CM

## 2020-05-20 NOTE — Telephone Encounter (Signed)
I called and advised patient of Paracentesis that has been scheduled for 9/20 @ [facility].  He was ok with both date/time per 9/17 staff message

## 2020-05-23 ENCOUNTER — Other Ambulatory Visit: Payer: Self-pay

## 2020-05-23 ENCOUNTER — Ambulatory Visit (HOSPITAL_COMMUNITY)
Admission: RE | Admit: 2020-05-23 | Discharge: 2020-05-23 | Disposition: A | Discharge status: Home / Self Care | Payer: BC Managed Care – PPO | Source: Ambulatory Visit | Attending: Family | Admitting: Family

## 2020-05-23 DIAGNOSIS — C259 Malignant neoplasm of pancreas, unspecified: Secondary | ICD-10-CM | POA: Diagnosis not present

## 2020-05-23 DIAGNOSIS — C787 Secondary malignant neoplasm of liver and intrahepatic bile duct: Secondary | ICD-10-CM | POA: Diagnosis not present

## 2020-05-23 DIAGNOSIS — R188 Other ascites: Secondary | ICD-10-CM | POA: Diagnosis not present

## 2020-05-23 DIAGNOSIS — R180 Malignant ascites: Secondary | ICD-10-CM | POA: Diagnosis not present

## 2020-05-23 MED ORDER — LIDOCAINE HCL 1 % IJ SOLN
INTRAMUSCULAR | Status: AC
  Filled 2020-05-23: qty 20

## 2020-05-23 NOTE — Procedures (Signed)
Ultrasound-guided diagnostic and therapeutic paracentesis performed yielding 1.5 liters of amber/blood-tinged fluid. No immediate complications. A portion of the fluid was sent to the lab for cytology. EBL none.

## 2020-05-24 LAB — CYTOLOGY - NON PAP

## 2020-05-25 NOTE — Progress Notes (Signed)
Pharmacist Chemotherapy Monitoring - Initial Assessment    Anticipated start date: 06/01/20            Regimen:  . Are orders appropriate based on the patient's diagnosis, regimen, and cycle? Yes . Does the plan date match the patient's scheduled date? Yes . Is the sequencing of drugs appropriate? Yes . Are the premedications appropriate for the patient's regimen? Yes . Prior Authorization for treatment is: Approved o If applicable, is the correct biosimilar selected based on the patient's insurance? not applicable  Organ Function and Labs: [name] Are dose adjustments needed based on the patient's renal function, hepatic function, or hematologic function? No . Are appropriate labs ordered prior to the start of patient's treatment? Yes . Other organ system assessment, if indicated: N/A . The following baseline labs, if indicated, have been ordered: N/A  Dose Assessment: . Are the drug doses appropriate? Yes . Are the following correct: o Drug concentrations Yes o IV fluid compatible with drug Yes o Administration routes Yes o Timing of therapy Yes . If applicable, does the patient have documented access for treatment and/or plans for port-a-cath placement? yes . If applicable, have lifetime cumulative doses been properly documented and assessed? not applicable Lifetime Dose Tracking  . Oxaliplatin: 1,004.519 mg/m2 (2,150 mg) = 167.42 % of the maximum lifetime dose of 600 mg/m2  o   Toxicity Monitoring/Prevention: . The patient has the following take home antiemetics prescribed: Ondansetron, Prochlorperazine and Lorazepam . The patient has the following take home medications prescribed: diarrhea prophylaxis . Medication allergies and previous infusion related reactions, if applicable, have been reviewed and addressed. Yes . The patient's current medication list has been assessed for drug-drug interactions with their chemotherapy regimen. no significant drug-drug interactions were  identified on review.  Order Review: . Are the treatment plan orders signed? Yes . Is the patient scheduled to see a provider prior to their treatment? Yes  I verify that I have reviewed each item in the above checklist and answered each question accordingly.  Vickie Melnik, [name] 05/25/2020 7:53 AM

## 2020-05-29 ENCOUNTER — Inpatient Hospital Stay (HOSPITAL_COMMUNITY)
Admission: EM | Admit: 2020-05-29 | Discharge: 2020-06-10 | DRG: 374 | Disposition: A | Discharge status: Hospice | Payer: BC Managed Care – PPO | Attending: Internal Medicine | Admitting: Internal Medicine

## 2020-05-29 ENCOUNTER — Encounter (HOSPITAL_COMMUNITY): Payer: Self-pay

## 2020-05-29 ENCOUNTER — Other Ambulatory Visit: Payer: Self-pay

## 2020-05-29 DIAGNOSIS — Z66 Do not resuscitate: Secondary | ICD-10-CM | POA: Diagnosis not present

## 2020-05-29 DIAGNOSIS — C787 Secondary malignant neoplasm of liver and intrahepatic bile duct: Secondary | ICD-10-CM | POA: Diagnosis present

## 2020-05-29 DIAGNOSIS — C259 Malignant neoplasm of pancreas, unspecified: Secondary | ICD-10-CM

## 2020-05-29 DIAGNOSIS — K5900 Constipation, unspecified: Secondary | ICD-10-CM

## 2020-05-29 DIAGNOSIS — R64 Cachexia: Secondary | ICD-10-CM | POA: Diagnosis present

## 2020-05-29 DIAGNOSIS — K8689 Other specified diseases of pancreas: Secondary | ICD-10-CM | POA: Diagnosis not present

## 2020-05-29 DIAGNOSIS — C772 Secondary and unspecified malignant neoplasm of intra-abdominal lymph nodes: Secondary | ICD-10-CM | POA: Diagnosis present

## 2020-05-29 DIAGNOSIS — Z9221 Personal history of antineoplastic chemotherapy: Secondary | ICD-10-CM

## 2020-05-29 DIAGNOSIS — E876 Hypokalemia: Secondary | ICD-10-CM | POA: Diagnosis not present

## 2020-05-29 DIAGNOSIS — R6520 Severe sepsis without septic shock: Secondary | ICD-10-CM | POA: Diagnosis not present

## 2020-05-29 DIAGNOSIS — K2100 Gastro-esophageal reflux disease with esophagitis, without bleeding: Secondary | ICD-10-CM | POA: Diagnosis present

## 2020-05-29 DIAGNOSIS — J9690 Respiratory failure, unspecified, unspecified whether with hypoxia or hypercapnia: Secondary | ICD-10-CM

## 2020-05-29 DIAGNOSIS — R112 Nausea with vomiting, unspecified: Secondary | ICD-10-CM | POA: Diagnosis not present

## 2020-05-29 DIAGNOSIS — R531 Weakness: Secondary | ICD-10-CM

## 2020-05-29 DIAGNOSIS — E874 Mixed disorder of acid-base balance: Secondary | ICD-10-CM | POA: Diagnosis present

## 2020-05-29 DIAGNOSIS — J9601 Acute respiratory failure with hypoxia: Secondary | ICD-10-CM | POA: Diagnosis not present

## 2020-05-29 DIAGNOSIS — J9811 Atelectasis: Secondary | ICD-10-CM | POA: Diagnosis not present

## 2020-05-29 DIAGNOSIS — R634 Abnormal weight loss: Secondary | ICD-10-CM | POA: Diagnosis not present

## 2020-05-29 DIAGNOSIS — R0902 Hypoxemia: Secondary | ICD-10-CM | POA: Diagnosis not present

## 2020-05-29 DIAGNOSIS — C784 Secondary malignant neoplasm of small intestine: Secondary | ICD-10-CM | POA: Diagnosis not present

## 2020-05-29 DIAGNOSIS — D702 Other drug-induced agranulocytosis: Secondary | ICD-10-CM

## 2020-05-29 DIAGNOSIS — R9431 Abnormal electrocardiogram [ECG] [EKG]: Secondary | ICD-10-CM | POA: Diagnosis present

## 2020-05-29 DIAGNOSIS — E860 Dehydration: Secondary | ICD-10-CM | POA: Diagnosis not present

## 2020-05-29 DIAGNOSIS — J690 Pneumonitis due to inhalation of food and vomit: Secondary | ICD-10-CM | POA: Diagnosis not present

## 2020-05-29 DIAGNOSIS — R627 Adult failure to thrive: Secondary | ICD-10-CM | POA: Diagnosis present

## 2020-05-29 DIAGNOSIS — K315 Obstruction of duodenum: Secondary | ICD-10-CM | POA: Diagnosis not present

## 2020-05-29 DIAGNOSIS — R0602 Shortness of breath: Secondary | ICD-10-CM | POA: Diagnosis not present

## 2020-05-29 DIAGNOSIS — R571 Hypovolemic shock: Secondary | ICD-10-CM | POA: Diagnosis not present

## 2020-05-29 DIAGNOSIS — K3189 Other diseases of stomach and duodenum: Secondary | ICD-10-CM

## 2020-05-29 DIAGNOSIS — D61818 Other pancytopenia: Secondary | ICD-10-CM | POA: Diagnosis not present

## 2020-05-29 DIAGNOSIS — J181 Lobar pneumonia, unspecified organism: Secondary | ICD-10-CM | POA: Diagnosis not present

## 2020-05-29 DIAGNOSIS — K219 Gastro-esophageal reflux disease without esophagitis: Secondary | ICD-10-CM | POA: Diagnosis not present

## 2020-05-29 DIAGNOSIS — Z20822 Contact with and (suspected) exposure to covid-19: Secondary | ICD-10-CM | POA: Diagnosis not present

## 2020-05-29 DIAGNOSIS — C786 Secondary malignant neoplasm of retroperitoneum and peritoneum: Secondary | ICD-10-CM | POA: Diagnosis present

## 2020-05-29 DIAGNOSIS — Z6823 Body mass index (BMI) 23.0-23.9, adult: Secondary | ICD-10-CM

## 2020-05-29 DIAGNOSIS — E873 Alkalosis: Secondary | ICD-10-CM | POA: Diagnosis not present

## 2020-05-29 DIAGNOSIS — T451X5A Adverse effect of antineoplastic and immunosuppressive drugs, initial encounter: Secondary | ICD-10-CM | POA: Diagnosis present

## 2020-05-29 DIAGNOSIS — I959 Hypotension, unspecified: Secondary | ICD-10-CM | POA: Diagnosis not present

## 2020-05-29 DIAGNOSIS — R110 Nausea: Secondary | ICD-10-CM

## 2020-05-29 DIAGNOSIS — Z515 Encounter for palliative care: Secondary | ICD-10-CM

## 2020-05-29 DIAGNOSIS — E222 Syndrome of inappropriate secretion of antidiuretic hormone: Secondary | ICD-10-CM | POA: Diagnosis present

## 2020-05-29 DIAGNOSIS — E871 Hypo-osmolality and hyponatremia: Secondary | ICD-10-CM | POA: Diagnosis present

## 2020-05-29 DIAGNOSIS — R6521 Severe sepsis with septic shock: Secondary | ICD-10-CM | POA: Diagnosis not present

## 2020-05-29 DIAGNOSIS — K311 Adult hypertrophic pyloric stenosis: Secondary | ICD-10-CM | POA: Diagnosis not present

## 2020-05-29 DIAGNOSIS — E878 Other disorders of electrolyte and fluid balance, not elsewhere classified: Secondary | ICD-10-CM | POA: Diagnosis not present

## 2020-05-29 DIAGNOSIS — R933 Abnormal findings on diagnostic imaging of other parts of digestive tract: Secondary | ICD-10-CM | POA: Diagnosis not present

## 2020-05-29 DIAGNOSIS — Z87891 Personal history of nicotine dependence: Secondary | ICD-10-CM

## 2020-05-29 DIAGNOSIS — Z7189 Other specified counseling: Secondary | ICD-10-CM | POA: Diagnosis not present

## 2020-05-29 DIAGNOSIS — I9581 Postprocedural hypotension: Secondary | ICD-10-CM | POA: Diagnosis not present

## 2020-05-29 DIAGNOSIS — Z833 Family history of diabetes mellitus: Secondary | ICD-10-CM

## 2020-05-29 DIAGNOSIS — Z4659 Encounter for fitting and adjustment of other gastrointestinal appliance and device: Secondary | ICD-10-CM | POA: Diagnosis not present

## 2020-05-29 DIAGNOSIS — J189 Pneumonia, unspecified organism: Secondary | ICD-10-CM | POA: Diagnosis not present

## 2020-05-29 DIAGNOSIS — R748 Abnormal levels of other serum enzymes: Secondary | ICD-10-CM

## 2020-05-29 DIAGNOSIS — R180 Malignant ascites: Secondary | ICD-10-CM | POA: Diagnosis not present

## 2020-05-29 DIAGNOSIS — K828 Other specified diseases of gallbladder: Secondary | ICD-10-CM | POA: Diagnosis not present

## 2020-05-29 DIAGNOSIS — C250 Malignant neoplasm of head of pancreas: Secondary | ICD-10-CM | POA: Diagnosis present

## 2020-05-29 DIAGNOSIS — K529 Noninfective gastroenteritis and colitis, unspecified: Secondary | ICD-10-CM | POA: Diagnosis not present

## 2020-05-29 DIAGNOSIS — R188 Other ascites: Secondary | ICD-10-CM | POA: Diagnosis not present

## 2020-05-29 DIAGNOSIS — E46 Unspecified protein-calorie malnutrition: Secondary | ICD-10-CM | POA: Diagnosis not present

## 2020-05-29 DIAGNOSIS — K56609 Unspecified intestinal obstruction, unspecified as to partial versus complete obstruction: Secondary | ICD-10-CM

## 2020-05-29 DIAGNOSIS — C170 Malignant neoplasm of duodenum: Secondary | ICD-10-CM | POA: Diagnosis not present

## 2020-05-29 DIAGNOSIS — A419 Sepsis, unspecified organism: Secondary | ICD-10-CM | POA: Diagnosis not present

## 2020-05-29 DIAGNOSIS — M109 Gout, unspecified: Secondary | ICD-10-CM | POA: Diagnosis present

## 2020-05-29 DIAGNOSIS — J90 Pleural effusion, not elsewhere classified: Secondary | ICD-10-CM | POA: Diagnosis not present

## 2020-05-29 LAB — LIPASE, BLOOD: Lipase: 29 U/L (ref 11–51)

## 2020-05-29 LAB — COMPREHENSIVE METABOLIC PANEL
ALT: 20 U/L (ref 0–44)
AST: 39 U/L (ref 15–41)
Albumin: 3.3 g/dL — ABNORMAL LOW (ref 3.5–5.0)
Alkaline Phosphatase: 182 U/L — ABNORMAL HIGH (ref 38–126)
BUN: 19 mg/dL (ref 8–23)
CO2: 42 mmol/L — ABNORMAL HIGH (ref 22–32)
Calcium: 8.6 mg/dL — ABNORMAL LOW (ref 8.9–10.3)
Chloride: <65 mmol/L — CL (ref 98–111)
Creatinine, Ser: 1.01 mg/dL (ref 0.61–1.24)
GFR calc Af Amer: >60 mL/min (ref >60)
GFR calc non Af Amer: >60 mL/min (ref >60)
Glucose, Bld: 118 mg/dL — ABNORMAL HIGH (ref 70–99)
Potassium: 2.4 mmol/L — CL (ref 3.5–5.1)
Sodium: 127 mmol/L — ABNORMAL LOW (ref 135–145)
Total Bilirubin: 1.8 mg/dL — ABNORMAL HIGH (ref 0.3–1.2)
Total Protein: 7.3 g/dL (ref 6.5–8.1)

## 2020-05-29 LAB — CBC
HCT: 42.4 % (ref 39.0–52.0)
Hemoglobin: 15.2 g/dL (ref 13.0–17.0)
MCH: 33.6 pg (ref 26.0–34.0)
MCHC: 35.8 g/dL (ref 30.0–36.0)
MCV: 93.8 fL (ref 80.0–100.0)
Platelets: 260 10*3/uL (ref 150–400)
RBC: 4.52 MIL/uL (ref 4.22–5.81)
RDW: 18.8 % — ABNORMAL HIGH (ref 11.5–15.5)
WBC: 9.4 10*3/uL (ref 4.0–10.5)
nRBC: 0 % (ref 0.0–0.2)

## 2020-05-29 LAB — CBG MONITORING, ED: Glucose-Capillary: 137 mg/dL — ABNORMAL HIGH (ref 70–99)

## 2020-05-29 MED ORDER — ONDANSETRON 4 MG PO TBDP
4.0000 mg | ORAL_TABLET | Freq: Once | ORAL | Status: AC | PRN
  Administered 2020-05-29: 4 mg via ORAL
  Filled 2020-05-29: qty 1

## 2020-05-29 NOTE — ED Triage Notes (Signed)
Patient arrived stating since Friday he has been vomiting. [place] patient, states his last treatment was Wednesday. Declines any pain but is dizzy when ambulating.

## 2020-05-29 NOTE — ED Notes (Signed)
Date and time results received: 05/29/20 11:36 PM  (use smartphrase ".now" to insert current time)  Test: K, Chloride Critical Value: 2.4 ; <65  Name of Provider Notified: Dr. Sedonia Small  Orders Received? Or Actions Taken?:

## 2020-05-30 ENCOUNTER — Emergency Department (HOSPITAL_COMMUNITY): Payer: BC Managed Care – PPO

## 2020-05-30 ENCOUNTER — Encounter (HOSPITAL_COMMUNITY): Payer: Self-pay | Admitting: Internal Medicine

## 2020-05-30 ENCOUNTER — Inpatient Hospital Stay (HOSPITAL_COMMUNITY): Payer: BC Managed Care – PPO

## 2020-05-30 ENCOUNTER — Observation Stay (HOSPITAL_COMMUNITY): Payer: BC Managed Care – PPO

## 2020-05-30 DIAGNOSIS — J9601 Acute respiratory failure with hypoxia: Secondary | ICD-10-CM | POA: Diagnosis not present

## 2020-05-30 DIAGNOSIS — C259 Malignant neoplasm of pancreas, unspecified: Secondary | ICD-10-CM | POA: Diagnosis not present

## 2020-05-30 DIAGNOSIS — I9581 Postprocedural hypotension: Secondary | ICD-10-CM | POA: Diagnosis not present

## 2020-05-30 DIAGNOSIS — J690 Pneumonitis due to inhalation of food and vomit: Secondary | ICD-10-CM | POA: Diagnosis not present

## 2020-05-30 DIAGNOSIS — J9811 Atelectasis: Secondary | ICD-10-CM | POA: Diagnosis not present

## 2020-05-30 DIAGNOSIS — K315 Obstruction of duodenum: Secondary | ICD-10-CM | POA: Diagnosis present

## 2020-05-30 DIAGNOSIS — E874 Mixed disorder of acid-base balance: Secondary | ICD-10-CM | POA: Diagnosis present

## 2020-05-30 DIAGNOSIS — R112 Nausea with vomiting, unspecified: Secondary | ICD-10-CM | POA: Diagnosis present

## 2020-05-30 DIAGNOSIS — A419 Sepsis, unspecified organism: Secondary | ICD-10-CM | POA: Diagnosis not present

## 2020-05-30 DIAGNOSIS — E860 Dehydration: Secondary | ICD-10-CM

## 2020-05-30 DIAGNOSIS — C784 Secondary malignant neoplasm of small intestine: Secondary | ICD-10-CM | POA: Diagnosis present

## 2020-05-30 DIAGNOSIS — I959 Hypotension, unspecified: Secondary | ICD-10-CM | POA: Diagnosis present

## 2020-05-30 DIAGNOSIS — C250 Malignant neoplasm of head of pancreas: Secondary | ICD-10-CM | POA: Diagnosis present

## 2020-05-30 DIAGNOSIS — E876 Hypokalemia: Secondary | ICD-10-CM | POA: Diagnosis present

## 2020-05-30 DIAGNOSIS — Z66 Do not resuscitate: Secondary | ICD-10-CM | POA: Diagnosis not present

## 2020-05-30 DIAGNOSIS — R0902 Hypoxemia: Secondary | ICD-10-CM | POA: Diagnosis not present

## 2020-05-30 DIAGNOSIS — R64 Cachexia: Secondary | ICD-10-CM | POA: Diagnosis present

## 2020-05-30 DIAGNOSIS — R180 Malignant ascites: Secondary | ICD-10-CM | POA: Diagnosis present

## 2020-05-30 DIAGNOSIS — C786 Secondary malignant neoplasm of retroperitoneum and peritoneum: Secondary | ICD-10-CM | POA: Diagnosis present

## 2020-05-30 DIAGNOSIS — R627 Adult failure to thrive: Secondary | ICD-10-CM | POA: Diagnosis present

## 2020-05-30 DIAGNOSIS — D61818 Other pancytopenia: Secondary | ICD-10-CM | POA: Diagnosis not present

## 2020-05-30 DIAGNOSIS — E46 Unspecified protein-calorie malnutrition: Secondary | ICD-10-CM | POA: Diagnosis present

## 2020-05-30 DIAGNOSIS — Z20822 Contact with and (suspected) exposure to covid-19: Secondary | ICD-10-CM | POA: Diagnosis present

## 2020-05-30 DIAGNOSIS — C787 Secondary malignant neoplasm of liver and intrahepatic bile duct: Secondary | ICD-10-CM | POA: Diagnosis present

## 2020-05-30 DIAGNOSIS — E871 Hypo-osmolality and hyponatremia: Secondary | ICD-10-CM | POA: Diagnosis present

## 2020-05-30 DIAGNOSIS — Z515 Encounter for palliative care: Secondary | ICD-10-CM | POA: Diagnosis not present

## 2020-05-30 DIAGNOSIS — K311 Adult hypertrophic pyloric stenosis: Secondary | ICD-10-CM | POA: Diagnosis not present

## 2020-05-30 DIAGNOSIS — Z7189 Other specified counseling: Secondary | ICD-10-CM | POA: Diagnosis not present

## 2020-05-30 DIAGNOSIS — E222 Syndrome of inappropriate secretion of antidiuretic hormone: Secondary | ICD-10-CM | POA: Diagnosis present

## 2020-05-30 DIAGNOSIS — T451X5A Adverse effect of antineoplastic and immunosuppressive drugs, initial encounter: Secondary | ICD-10-CM

## 2020-05-30 DIAGNOSIS — R531 Weakness: Secondary | ICD-10-CM | POA: Diagnosis not present

## 2020-05-30 DIAGNOSIS — C772 Secondary and unspecified malignant neoplasm of intra-abdominal lymph nodes: Secondary | ICD-10-CM | POA: Diagnosis present

## 2020-05-30 DIAGNOSIS — R6521 Severe sepsis with septic shock: Secondary | ICD-10-CM | POA: Diagnosis not present

## 2020-05-30 DIAGNOSIS — E873 Alkalosis: Secondary | ICD-10-CM | POA: Diagnosis not present

## 2020-05-30 DIAGNOSIS — R6520 Severe sepsis without septic shock: Secondary | ICD-10-CM | POA: Diagnosis not present

## 2020-05-30 DIAGNOSIS — R571 Hypovolemic shock: Secondary | ICD-10-CM | POA: Diagnosis present

## 2020-05-30 DIAGNOSIS — E878 Other disorders of electrolyte and fluid balance, not elsewhere classified: Secondary | ICD-10-CM | POA: Diagnosis present

## 2020-05-30 DIAGNOSIS — R9431 Abnormal electrocardiogram [ECG] [EKG]: Secondary | ICD-10-CM | POA: Diagnosis present

## 2020-05-30 LAB — URINALYSIS, ROUTINE W REFLEX MICROSCOPIC
Glucose, UA: NEGATIVE mg/dL
Hgb urine dipstick: NEGATIVE
Ketones, ur: 80 mg/dL — AB
Leukocytes,Ua: NEGATIVE
Nitrite: NEGATIVE
Protein, ur: 30 mg/dL — AB
Specific Gravity, Urine: 1.021 (ref 1.005–1.030)
pH: 8 (ref 5.0–8.0)

## 2020-05-30 LAB — CBC
HCT: 34.2 % — ABNORMAL LOW (ref 39.0–52.0)
Hemoglobin: 12 g/dL — ABNORMAL LOW (ref 13.0–17.0)
MCH: 33.9 pg (ref 26.0–34.0)
MCHC: 35.1 g/dL (ref 30.0–36.0)
MCV: 96.6 fL (ref 80.0–100.0)
Platelets: 155 10*3/uL (ref 150–400)
RBC: 3.54 MIL/uL — ABNORMAL LOW (ref 4.22–5.81)
RDW: 18.9 % — ABNORMAL HIGH (ref 11.5–15.5)
WBC: 6.8 10*3/uL (ref 4.0–10.5)
nRBC: 0 % (ref 0.0–0.2)

## 2020-05-30 LAB — BASIC METABOLIC PANEL
Anion gap: 16 — ABNORMAL HIGH (ref 5–15)
Anion gap: 14 (ref 5–15)
BUN: 17 mg/dL (ref 8–23)
BUN: 17 mg/dL (ref 8–23)
CO2: 44 mmol/L — ABNORMAL HIGH (ref 22–32)
CO2: 40 mmol/L — ABNORMAL HIGH (ref 22–32)
Calcium: 7.4 mg/dL — ABNORMAL LOW (ref 8.9–10.3)
Calcium: 7.2 mg/dL — ABNORMAL LOW (ref 8.9–10.3)
Chloride: 66 mmol/L — ABNORMAL LOW (ref 98–111)
Chloride: 72 mmol/L — ABNORMAL LOW (ref 98–111)
Creatinine, Ser: 0.81 mg/dL (ref 0.61–1.24)
Creatinine, Ser: 0.76 mg/dL (ref 0.61–1.24)
GFR calc Af Amer: >60 mL/min (ref >60)
GFR calc Af Amer: >60 mL/min (ref >60)
GFR calc non Af Amer: >60 mL/min (ref >60)
GFR calc non Af Amer: >60 mL/min (ref >60)
Glucose, Bld: 110 mg/dL — ABNORMAL HIGH (ref 70–99)
Glucose, Bld: 89 mg/dL (ref 70–99)
Potassium: 2.6 mmol/L — CL (ref 3.5–5.1)
Potassium: 3.1 mmol/L — ABNORMAL LOW (ref 3.5–5.1)
Sodium: 126 mmol/L — ABNORMAL LOW (ref 135–145)
Sodium: 126 mmol/L — ABNORMAL LOW (ref 135–145)

## 2020-05-30 LAB — RESPIRATORY PANEL BY RT PCR (FLU A&B, COVID)
Influenza A by PCR: NEGATIVE
Influenza B by PCR: NEGATIVE
SARS Coronavirus 2 by RT PCR: NEGATIVE

## 2020-05-30 LAB — SODIUM, URINE, RANDOM: Sodium, Ur: 144 mmol/L

## 2020-05-30 LAB — LACTIC ACID, PLASMA: Lactic Acid, Venous: 0.9 mmol/L (ref 0.5–1.9)

## 2020-05-30 LAB — OSMOLALITY: Osmolality: 264 mOsm/kg — ABNORMAL LOW (ref 275–295)

## 2020-05-30 LAB — OSMOLALITY, URINE: Osmolality, Ur: 682 mOsm/kg (ref 300–900)

## 2020-05-30 LAB — MAGNESIUM: Magnesium: 2.4 mg/dL (ref 1.7–2.4)

## 2020-05-30 LAB — CORTISOL: Cortisol, Plasma: 17 ug/dL

## 2020-05-30 MED ORDER — SODIUM CHLORIDE 0.9 % IV SOLN
INTRAVENOUS | Status: DC
  Filled 2020-05-30 (×4): qty 1000

## 2020-05-30 MED ORDER — POTASSIUM CHLORIDE IN NACL 40-0.9 MEQ/L-% IV SOLN
INTRAVENOUS | Status: DC
  Filled 2020-05-30 (×2): qty 1000

## 2020-05-30 MED ORDER — OXYCODONE-ACETAMINOPHEN 5-325 MG PO TABS: 1.0000 | ORAL_TABLET | Freq: Four times a day (QID) | ORAL | Status: DC | PRN

## 2020-05-30 MED ORDER — ENOXAPARIN SODIUM 40 MG/0.4ML ~~LOC~~ SOLN
40.0000 mg | SUBCUTANEOUS | Status: DC
  Administered 2020-05-30 – 2020-06-08 (×10): 40 mg via SUBCUTANEOUS
  Filled 2020-05-30 (×11): qty 0.4

## 2020-05-30 MED ORDER — POTASSIUM CHLORIDE 10 MEQ/100ML IV SOLN
10.0000 meq | INTRAVENOUS | Status: AC
  Administered 2020-05-30 (×4): 10 meq via INTRAVENOUS
  Filled 2020-05-30 (×4): qty 100

## 2020-05-30 MED ORDER — CHLORHEXIDINE GLUCONATE CLOTH 2 % EX PADS
6.0000 | MEDICATED_PAD | Freq: Every day | CUTANEOUS | Status: DC
  Administered 2020-05-30 – 2020-06-08 (×10): 6 via TOPICAL

## 2020-05-30 MED ORDER — POTASSIUM CHLORIDE CRYS ER 20 MEQ PO TBCR
40.0000 meq | EXTENDED_RELEASE_TABLET | Freq: Once | ORAL | Status: AC
  Administered 2020-05-30: 40 meq via ORAL
  Filled 2020-05-30: qty 2

## 2020-05-30 MED ORDER — GABAPENTIN 300 MG PO CAPS
300.0000 mg | ORAL_CAPSULE | Freq: Three times a day (TID) | ORAL | Status: DC
  Administered 2020-05-30 – 2020-05-31 (×6): 300 mg via ORAL
  Filled 2020-05-30 (×6): qty 1

## 2020-05-30 MED ORDER — LACTATED RINGERS IV SOLN: INTRAVENOUS | Status: DC

## 2020-05-30 MED ORDER — ACETAMINOPHEN 650 MG RE SUPP: 650.0000 mg | Freq: Four times a day (QID) | RECTAL | Status: DC | PRN

## 2020-05-30 MED ORDER — ACETAMINOPHEN 325 MG PO TABS: 650.0000 mg | ORAL_TABLET | Freq: Four times a day (QID) | ORAL | Status: DC | PRN

## 2020-05-30 MED ORDER — PANCRELIPASE (LIP-PROT-AMYL) 36000-114000 UNITS PO CPEP
72000.0000 [IU] | ORAL_CAPSULE | Freq: Three times a day (TID) | ORAL | Status: DC
  Administered 2020-05-30 – 2020-05-31 (×5): 72000 [IU] via ORAL
  Filled 2020-05-30: qty 6
  Filled 2020-05-30 (×2): qty 2
  Filled 2020-05-30: qty 6
  Filled 2020-05-30 (×3): qty 2
  Filled 2020-05-30: qty 6

## 2020-05-30 MED ORDER — SODIUM CHLORIDE 0.9 % IV BOLUS
1000.0000 mL | Freq: Once | INTRAVENOUS | Status: AC
  Administered 2020-05-30: 1000 mL via INTRAVENOUS

## 2020-05-30 MED ORDER — ONDANSETRON HCL 4 MG/2ML IJ SOLN: 4.0000 mg | Freq: Four times a day (QID) | INTRAMUSCULAR | Status: DC | PRN

## 2020-05-30 MED ORDER — TAMSULOSIN HCL 0.4 MG PO CAPS
0.4000 mg | ORAL_CAPSULE | Freq: Every day | ORAL | Status: DC
  Administered 2020-05-30 – 2020-05-31 (×2): 0.4 mg via ORAL
  Filled 2020-05-30 (×2): qty 1

## 2020-05-30 MED ORDER — POTASSIUM CHLORIDE 10 MEQ/100ML IV SOLN
10.0000 meq | INTRAVENOUS | Status: DC
  Administered 2020-05-30 (×2): 10 meq via INTRAVENOUS
  Filled 2020-05-30 (×2): qty 100

## 2020-05-30 MED ORDER — PANTOPRAZOLE SODIUM 40 MG IV SOLR
40.0000 mg | Freq: Two times a day (BID) | INTRAVENOUS | Status: DC
  Administered 2020-05-30 – 2020-06-09 (×22): 40 mg via INTRAVENOUS
  Filled 2020-05-30 (×22): qty 40

## 2020-05-30 MED ORDER — MAGNESIUM SULFATE 2 GM/50ML IV SOLN
2.0000 g | Freq: Once | INTRAVENOUS | Status: AC
  Administered 2020-05-30: 2 g via INTRAVENOUS
  Filled 2020-05-30: qty 50

## 2020-05-30 MED ORDER — PROCHLORPERAZINE EDISYLATE 10 MG/2ML IJ SOLN: 10.0000 mg | Freq: Four times a day (QID) | INTRAMUSCULAR | Status: DC | PRN

## 2020-05-30 MED ORDER — MAGNESIUM OXIDE 400 (241.3 MG) MG PO TABS
800.0000 mg | ORAL_TABLET | Freq: Once | ORAL | Status: AC
  Administered 2020-05-30: 800 mg via ORAL
  Filled 2020-05-30: qty 2

## 2020-05-30 MED ORDER — METRONIDAZOLE IN NACL 5-0.79 MG/ML-% IV SOLN
500.0000 mg | Freq: Three times a day (TID) | INTRAVENOUS | Status: DC
  Administered 2020-05-30 – 2020-05-31 (×2): 500 mg via INTRAVENOUS
  Filled 2020-05-30 (×2): qty 100

## 2020-05-30 NOTE — ED Notes (Signed)
Patient placed on 2LNC due to O2 stats dropping to 78%. After O2 placed O2 increased to 96%.

## 2020-05-30 NOTE — ED Provider Notes (Signed)
[facility] Emergency Department Provider Note MRN:  [id number]  Arrival date & time: 05/30/20     Chief Complaint   Emesis   History of Present Illness   Jeffrey Harris is a 64 y.o. year-old male with a history of pancreatic cancer presenting to the ED with chief complaint of nausea.  Significant nausea and poor p.o. intake for the past 2 or 3 days.  Currently on chemotherapy infusions, receiving Abraxane and Gemzar.  Multiple episodes of vomiting, nonbloody nonbilious.  Denies any abdominal pain.  Decreased bowel movements for the past 3 days, occasional passage of gas.  Denies any chest pain or shortness of breath, no fever, no cough.  Symptoms are constant, moderate to severe, no exacerbating or alleviating factors.  Review of Systems  A complete 10 system review of systems was obtained and all systems are negative except as noted in the HPI and PMH.   Patient's Health History    Past Medical History:  Diagnosis Date  . Goals of care, counseling/discussion 07/29/2019  . Gout   . Hyperbilirubinemia 07/2019  . Pancreatic cancer metastasized to liver [facility]) 07/29/2019    Past Surgical History:  Procedure Laterality Date  . BIOPSY  07/25/2019   Procedure: BIOPSY;  Surgeon: Ronnette Juniper, MD;  [facility] ENDOSCOPY;  Service: Gastroenterology;;  . ESOPHAGOGASTRODUODENOSCOPY  07/25/2019   Procedure: ESOPHAGOGASTRODUODENOSCOPY (EGD);  Surgeon: Ronnette Juniper, MD;  Location: [place];  Service: Gastroenterology;;  . IR BILIARY STENT(S) EXISTING ACCESS INC DILATION CATH EXCHANGE  08/27/2019  . IR CHOLANGIOGRAM EXISTING TUBE  09/01/2019  . IR CONVERT BILIARY DRAIN TO INT EXT BILIARY DRAIN  08/27/2019  . IR IMAGING GUIDED PORT INSERTION  08/06/2019  . IR INT EXT BILIARY DRAIN WITH CHOLANGIOGRAM  07/26/2019  . IR PARACENTESIS  05/16/2020  . IR RADIOLOGIST EVAL & MGMT  08/05/2019  . IR REMOVAL BILIARY DRAIN  09/01/2019  . KNEE ARTHROSCOPY      Family History    Problem Relation Age of Onset  . Diabetes Father     Social History   Socioeconomic History  . Marital status: Married    Spouse name: Not on file  . Number of children: Not on file  . Years of education: Not on file  . Highest education level: Not on file  Occupational History  . Not on file  Tobacco Use  . Smoking status: Never Smoker  . Smokeless tobacco: Former Systems developer    Types: Snuff  Vaping Use  . Vaping Use: Never used  Substance and Sexual Activity  . Alcohol use: No  . Drug use: No  . Sexual activity: Not Currently  Other Topics Concern  . Not on file  Social History Narrative  . Not on file   Social Determinants of Health   Financial Resource Strain:   . Difficulty of Paying Living Expenses: Not on file  Food Insecurity:   . Worried About Charity fundraiser in the Last Year: Not on file  . Ran Out of Food in the Last Year: Not on file  Transportation Needs:   . Lack of Transportation (Medical): Not on file  . Lack of Transportation (Non-Medical): Not on file  Physical Activity:   . Days of Exercise per Week: Not on file  . Minutes of Exercise per Session: Not on file  Stress:   . Feeling of Stress : Not on file  Social Connections:   . Frequency of Communication with Friends and Family: Not on file  .  Frequency of Social Gatherings with Friends and Family: Not on file  . Attends Religious Services: Not on file  . Active Member of Clubs or Organizations: Not on file  . Attends Archivist Meetings: Not on file  . Marital Status: Not on file  Intimate Partner Violence:   . Fear of Current or Ex-Partner: Not on file  . Emotionally Abused: Not on file  . Physically Abused: Not on file  . Sexually Abused: Not on file     Physical Exam   Vitals:   05/29/20 2354 05/30/20 0105  BP: 94/70 (!) 83/66  Pulse: 94 81  Resp: 15 11  Temp:    SpO2: 97% 99%    CONSTITUTIONAL: Chronically ill-appearing, thin, NAD NEURO:  Alert and oriented x 3, no  focal deficits EYES:  eyes equal and reactive ENT/NECK:  no LAD, no JVD CARDIO: Tachycardic rate, well-perfused, normal S1 and S2 PULM:  CTAB no wheezing or rhonchi GI/GU:  normal bowel sounds, non-distended, non-tender MSK/SPINE:  No gross deformities, no edema SKIN:  no rash, atraumatic PSYCH:  Appropriate speech and behavior  *Additional and/or pertinent findings included in MDM below  Diagnostic and Interventional Summary    EKG Interpretation  Date/Time:   05/29/2020 @ 22:42:17 Ventricular Rate:   108 PR Interval:    QRS Duration:  104 QT Interval:   385 QTC Calculation:  517 R Axis:     Text Interpretation:  SR, NSIVCD Confirmed by Dr. Sedonia Small at 1:09am      Labs Reviewed  COMPREHENSIVE METABOLIC PANEL - Abnormal; Notable for the following components:      Result Value   Sodium 127 (*)    Potassium 2.4 (*)    Chloride <65 (*)    CO2 42 (*)    Glucose, Bld 118 (*)    Calcium 8.6 (*)    Albumin 3.3 (*)    Alkaline Phosphatase 182 (*)    Total Bilirubin 1.8 (*)    All other components within normal limits  CBC - Abnormal; Notable for the following components:   RDW 18.8 (*)    All other components within normal limits  CBG MONITORING, ED - Abnormal; Notable for the following components:   Glucose-Capillary 137 (*)    All other components within normal limits  RESPIRATORY PANEL BY RT PCR (FLU A&B, COVID)  LIPASE, BLOOD  URINALYSIS, ROUTINE W REFLEX MICROSCOPIC    DG ABD ACUTE 2+V W 1V CHEST  Final Result      Medications  sodium chloride 0.9 % bolus 1,000 mL (has no administration in time range)  magnesium sulfate IVPB 2 g 50 mL (has no administration in time range)  potassium chloride SA (KLOR-CON) CR tablet 40 mEq (has no administration in time range)  potassium chloride 10 mEq in 100 mL IVPB (has no administration in time range)  magnesium oxide (MAG-OX) tablet 800 mg (has no administration in time range)  ondansetron (ZOFRAN-ODT) disintegrating tablet 4  mg (4 mg Oral Given 05/29/20 2245)     Procedures  /  Critical Care .Critical Care Performed by: Maudie Flakes, MD Authorized by: Maudie Flakes, MD   Critical care provider statement:    Critical care time (minutes):  45   Critical care was necessary to treat or prevent imminent or life-threatening deterioration of the following conditions:  Metabolic crisis (critical hypokalemia and hypochloremia)   Critical care was time spent personally by me on the following activities:  Discussions with consultants, evaluation of  patient's response to treatment, examination of patient, ordering and performing treatments and interventions, ordering and review of laboratory studies, ordering and review of radiographic studies, pulse oximetry, re-evaluation of patient's condition, obtaining history from patient or surrogate and review of old charts    ED Course and Medical Decision Making  I have reviewed the triage vital signs, the nursing notes, and pertinent available records from the EMR.  Listed above are laboratory and imaging tests that I personally ordered, reviewed, and interpreted and then considered in my medical decision making (see below for details).  Considering side effect of chemotherapy versus less likely obstructive GI process.  Abdomen is soft and nontender, no distention.  Labs are highly abnormal, hypokalemia, hypochloremia, metabolic alkalosis all suggesting persistent vomiting.  Providing supplementation, IV fluids, will admit to medicine.       Barth Kirks. Sedonia Small, [place] [email or web address]  Final Clinical Impressions(s) / ED Diagnoses     ICD-10-CM   1. Chemotherapy-induced nausea and vomiting  R11.2    T45.1X5A   2. Nausea  R11.0 DG ABD ACUTE 2+V W 1V CHEST    DG ABD ACUTE 2+V W 1V CHEST  3. Hypokalemia  E87.6   4. Hypochloremia  E87.8   5. Alkalosis  E87.3     ED Discharge Orders    None       Discharge  Instructions Discussed with and Provided to Patient:   Discharge Instructions   None       Maudie Flakes, MD 05/30/20 [phone]

## 2020-05-30 NOTE — Consult Note (Addendum)
[place]  [phone] [phone] [phone]  Reason for Consultation: Metastatic pancreatic adenocarcinoma  HPI: Mr. 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  He presented to the emergency room with generalized weakness, nausea, vomiting.  Symptoms have been present for about 4 weeks.  Patient was found to be significantly dehydrated and to have hyponatremia and hypokalemia.  He is receiving IV fluids and electrolyte replacement.  Chest respiratory and abdominal x-ray performed which showed atelectatic changes in the lungs, no high-grade obstructive bowel gas bladder, no free air, moderate colonic stool burden, possible ascites.  GI consult has been requested and a CT of the abdomen pelvis has been ordered to rule out obstruction.  The patient is a receiving chemotherapy with Gemzar and Abraxane.  Last dose was given on 05/10/2020.  Due to continued disease progression, the plan had been to change his chemotherapy to liposomal Irinotecan/leucovorin/5-FU.  First dose was planned for this week on 06/01/2020.  The patient was seen in the emergency room this morning.  He reports ongoing weakness, nausea, but is not actively vomiting.  He states that he has been able to eat but does not stay down very long.  He is not currently complaining of any abdominal pain or abdominal distention.  Denies diarrhea currently.  He has not seen any melena or hematochezia.  No chest pain or shortness of breath reported.  No bleeding reported.  Medical oncology was asked see the patient for recommendations regarding his metastatic pancreatic adenocarcinoma.   Past Medical History:  Diagnosis Date  . Goals of care, counseling/discussion 07/29/2019  . Gout   . Hyperbilirubinemia 07/2019  . Pancreatic cancer metastasized to liver [facility]) 07/29/2019   :  Past Surgical History:  Procedure Laterality Date  . BIOPSY  07/25/2019   Procedure: BIOPSY;  Surgeon: Ronnette Juniper, MD;  Location: [facility] ENDOSCOPY;  Service: Gastroenterology;;  . ESOPHAGOGASTRODUODENOSCOPY  07/25/2019   Procedure: ESOPHAGOGASTRODUODENOSCOPY (EGD);  Surgeon: Ronnette Juniper, MD;  Location: [place];  Service: Gastroenterology;;  . IR BILIARY STENT(S) EXISTING ACCESS INC DILATION CATH EXCHANGE  08/27/2019  . IR CHOLANGIOGRAM EXISTING TUBE  09/01/2019  . IR CONVERT BILIARY DRAIN TO INT EXT BILIARY DRAIN  08/27/2019  . IR IMAGING GUIDED PORT INSERTION  08/06/2019  . IR INT EXT BILIARY DRAIN WITH CHOLANGIOGRAM  07/26/2019  . IR PARACENTESIS  05/16/2020  . IR RADIOLOGIST EVAL & MGMT  08/05/2019  . IR REMOVAL BILIARY DRAIN  09/01/2019  . KNEE ARTHROSCOPY    :  Current Facility-Administered Medications  Medication Dose Route Frequency Provider Last Rate Last Admin  . acetaminophen (TYLENOL) tablet 650 mg  650 mg Oral Q6H PRN Shalhoub, Sherryll Burger, MD       Or  . acetaminophen (TYLENOL) suppository 650 mg  650 mg Rectal Q6H PRN Shalhoub, Sherryll Burger, MD      . enoxaparin (LOVENOX) injection 40 mg  40 mg Subcutaneous Q24H Shalhoub, Sherryll Burger, MD   40 mg at 05/30/20 0928  . gabapentin (NEURONTIN) capsule 300 mg  300 mg Oral TID Vernelle Emerald, MD   300 mg at 05/30/20 0921  . lipase/protease/amylase (CREON) capsule 72,000 Units  72,000 Units Oral TID AC Shalhoub, Sherryll Burger, MD   72,000 Units at 05/30/20 1301  . oxyCODONE-acetaminophen (PERCOCET/ROXICET) 5-325 MG per tablet 1 tablet  1 tablet Oral Q6H PRN Shalhoub, Sherryll Burger, MD      .  pantoprazole (PROTONIX) injection 40 mg  40 mg Intravenous Q12H Shalhoub, Sherryll Burger, MD   40 mg at 05/30/20 0924  . prochlorperazine (COMPAZINE) injection 10 mg  10 mg Intravenous Q6H PRN Shalhoub, Sherryll Burger, MD      . sodium chloride 0.9 % 1,000 mL with potassium chloride 40 mEq infusion   Intravenous Continuous Rai, Ripudeep K, MD 100 mL/hr at 05/30/20  0920 New Bag at 05/30/20 0920  . tamsulosin (FLOMAX) capsule 0.4 mg  0.4 mg Oral Daily Shalhoub, Sherryll Burger, MD   0.4 mg at 05/30/20 3716   Current Outpatient Medications  Medication Sig Dispense Refill  . diphenoxylate-atropine (LOMOTIL) 2.5-0.025 MG tablet Take 1-2 tablets by mouth 4 (four) times daily as needed for diarrhea or loose stools. 100 tablet 0  . gabapentin (NEURONTIN) 300 MG capsule Take 1 capsule (300 mg total) by mouth 3 (three) times daily. 90 capsule 6  . hydrocortisone (ANUSOL-HC) 25 MG suppository PLACE 1 SUPPOSITORY (25 MG TOTAL) RECTALLY 2 (TWO) TIMES DAILY. FOR 7 DAYS 14 suppository 0  . KLOR-CON M20 20 MEQ tablet TAKE 2 TABLETS BY MOUTH DAILY 180 tablet 1  . lactulose (CHRONULAC) 10 GM/15ML solution SMARTSIG:40 Milliliter(s) By Mouth Morning-Evening 946 mL 4  . lipase/protease/amylase (CREON) 36000 UNITS CPEP capsule Take 2 capsules (72,000 Units total) by mouth 3 (three) times daily before meals. 180 capsule 6  . ondansetron (ZOFRAN) 8 MG tablet Take 1 tablet (8 mg total) by mouth every 8 (eight) hours as needed for nausea or vomiting. 20 tablet 2  . oxyCODONE-acetaminophen (PERCOCET/ROXICET) 5-325 MG tablet Take 1 tablet by mouth every 6 (six) hours as needed for severe pain.    . pantoprazole (PROTONIX) 40 MG tablet TAKE 1 TABLET BY MOUTH TWICE A DAY 180 tablet 1  . tamsulosin (FLOMAX) 0.4 MG CAPS capsule Take 0.4 mg by mouth daily.    [name] zolpidem (AMBIEN) 5 MG tablet Take 1 tablet (5 mg total) by mouth at bedtime as needed for sleep. 30 tablet 1  . allopurinol (ZYLOPRIM) 300 MG tablet Take 300 mg by mouth daily. (Patient not taking: Reported on 05/30/2020)    . linaclotide (LINZESS) 145 MCG CAPS capsule Take 1 capsule (145 mcg total) by mouth daily before breakfast. 30 capsule 4   Facility-Administered Medications Ordered in Other Encounters  Medication Dose Route Frequency Provider Last Rate Last Admin  . sodium chloride flush (NS) 0.9 % injection 10 mL  10 mL  Intravenous PRN Volanda Napoleon, MD   10 mL at 09/07/19 1245     No Known Allergies:  Family History  Problem Relation Age of Onset  . Diabetes Father   :  Social History   Socioeconomic History  . Marital status: Married    Spouse name: Not on file  . Number of children: Not on file  . Years of education: Not on file  . Highest education level: Not on file  Occupational History  . Not on file  Tobacco Use  . Smoking status: Never Smoker  . Smokeless tobacco: Former Systems developer    Types: Snuff  Vaping Use  . Vaping Use: Never used  Substance and Sexual Activity  . Alcohol use: No  . Drug use: No  . Sexual activity: Not Currently  Other Topics Concern  . Not on file  Social History Narrative  . Not on file   Social Determinants of Health   Financial Resource Strain:   . Difficulty of Paying Living Expenses: Not on file  Food Insecurity:   . Worried About Charity fundraiser in the Last Year: Not on file  . Ran Out of Food in the Last Year: Not on file  Transportation Needs:   . Lack of Transportation (Medical): Not on file  . Lack of Transportation (Non-Medical): Not on file  Physical Activity:   . Days of Exercise per Week: Not on file  . Minutes of Exercise per Session: Not on file  Stress:   . Feeling of Stress : Not on file  Social Connections:   . Frequency of Communication with Friends and Family: Not on file  . Frequency of Social Gatherings with Friends and Family: Not on file  . Attends Religious Services: Not on file  . Active Member of Clubs or Organizations: Not on file  . Attends Archivist Meetings: Not on file  . Marital Status: Not on file  Intimate Partner Violence:   . Fear of Current or Ex-Partner: Not on file  . Emotionally Abused: Not on file  . Physically Abused: Not on file  . Sexually Abused: Not on file  :  Review of Systems: A comprehensive 14 point review of systems was negative except as noted in the  HPI.  Exam: Patient Vitals for the past 24 hrs:  BP Temp Temp src Pulse Resp SpO2  05/30/20 1400 95/69 -- -- 63 18 100 %  05/30/20 1238 94/65 -- -- 73 20 100 %  05/30/20 1145 91/66 -- -- 74 20 100 %  05/30/20 1032 105/78 97.9 F (36.6 C) Oral 63 18 100 %  05/30/20 1030 106/78 -- -- 73 15 97 %  05/30/20 1000 90/63 -- -- 67 20 (!) 73 %  05/30/20 0930 (!) 81/61 -- -- 65 18 95 %  05/30/20 0916 111/68 97.9 F (36.6 C) Oral 76 (!) 23 98 %  05/30/20 0830 98/65 -- -- 71 16 91 %  05/30/20 0806 97/70 97.9 F (36.6 C) Oral 74 (!) 34 95 %  05/30/20 0800 97/70 -- -- 73 18 97 %  05/30/20 0730 (!) 87/66 -- -- 78 (!) 29 95 %  05/30/20 0707 -- -- -- 78 14 (!) 88 %  05/30/20 0700 93/70 -- -- 80 18 96 %  05/30/20 0630 (!) 88/71 -- -- 76 13 98 %  05/30/20 0615 92/71 -- -- 77 11 99 %  05/30/20 0600 92/71 -- -- 82 15 100 %  05/30/20 0430 91/67 -- -- 81 19 100 %  05/30/20 0330 96/71 -- -- 65 14 99 %  05/30/20 0328 96/71 -- -- 86 13 99 %  05/30/20 0118 -- -- -- 77 13 98 %  05/30/20 0112 -- -- -- 80 14 (!) 83 %  05/30/20 0105 (!) 83/66 -- -- 81 11 99 %  05/29/20 2354 94/70 -- -- 94 15 97 %  05/29/20 2248 (!) 81/65 97.9 F (36.6 C) Oral (!) 109 (!) 24 99 %    General: Awake and alert, ill-appearing Eyes:  no scleral icterus.   ENT:  There were no oropharyngeal lesions.   Respiratory: lungs were clear bilaterally without wheezing or crackles.   Cardiovascular:  Regular rate and rhythm, S1/S2, without murmur, rub or gallop.  There was no pedal edema.   GI: Positive bowel sounds, soft, minimal ascites noted  Skin exam was without echymosis, petichae.   Neuro exam was nonfocal. Patient was alert and oriented.  Attention was good.   Language was appropriate.  Mood was normal without depression.  Speech was not pressured.  Thought content was not tangential.     Lab Results  Component Value Date   WBC 6.8 05/30/2020   HGB 12.0 (L) 05/30/2020   HCT 34.2 (L) 05/30/2020   PLT 155 05/30/2020    GLUCOSE 89 05/30/2020   ALT 20 05/29/2020   AST 39 05/29/2020   NA 126 (L) 05/30/2020   K 3.1 (L) 05/30/2020   CL 72 (L) 05/30/2020   CREATININE 0.76 05/30/2020   BUN 17 05/30/2020   CO2 40 (H) 05/30/2020    CT CHEST ABDOMEN PELVIS W CONTRAST  Result Date: 05/03/2020 CLINICAL DATA:  Follow-up metastatic pancreatic carcinoma. Undergoing chemotherapy. EXAM: CT CHEST, ABDOMEN, AND PELVIS WITH CONTRAST TECHNIQUE: Multidetector CT imaging of the chest, abdomen and pelvis was performed following the standard protocol during bolus administration of intravenous contrast. CONTRAST:  151mL OMNIPAQUE IOHEXOL 300 MG/ML  SOLN COMPARISON:  02/10/2020 FINDINGS: CT CHEST FINDINGS Cardiovascular: No acute findings. Mediastinum/Lymph Nodes: Mild multinodular goiter again noted. No other masses or pathologically enlarged lymph nodes identified. Lungs/Pleura: No pulmonary infiltrate or masses identified. New tiny bilateral pleural effusions noted. Musculoskeletal: No suspicious bone lesions identified. Old fracture deformity of the left anterior 4th rib is stable in appearance. CT ABDOMEN AND PELVIS FINDINGS Hepatobiliary: No masses identified. Stable diffuse hepatic steatosis. Stent again seen in the distal common bile duct. Pneumobilia again noted. Pancreas: Soft tissue mass in the pancreatic head surrounding the distal common bile duct stent measures 4.1 x 3.5 cm on image 69/2, without significant change when remeasured in same planes on prior study. Spleen:  Within normal limits in size and appearance. Adrenals/Urinary tract: Stable small low-attenuation left adrenal masses, consistent with benign adenomas. 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 No evidence of renal masses or hydronephrosis. No masses or hydronephrosis. Stomach/Bowel: No evidence of obstruction, inflammatory process, or abnormal fluid collections. Vascular/Lymphatic: Mild lymphadenopathy is again seen adjacent to the  pancreatic head and in the porta hepatis, without significant change since previous study. Largest index lymph node adjacent to the pancreatic head measures 1.5 cm on image 65/2, without significant change. Multiple sub-cm lymph nodes in the central small bowel mesentery also show no significant change. No new or increased sites of lymphadenopathy identified. No abdominal aortic aneurysm. Reproductive:  No mass or other significant abnormality identified. Other:  New moderate ascites and diffuse mesenteric edema noted. Musculoskeletal:  No suspicious bone lesions identified. IMPRESSION: No significant change in pancreatic head mass surrounding the distal common bile duct stent. Stable mild lymphadenopathy in peripancreatic region, porta hepatis, and central small bowel mesentery, consistent with metastatic disease. New moderate ascites and diffuse mesenteric edema. Stable hepatic steatosis and benign left adrenal adenomas. Electronically Signed   By: Marlaine Hind M.D.   On: 05/03/2020 13:23   US Paracentesis  Result Date: 05/23/2020 INDICATION: Patient with history of metastatic pancreatic cancer, recurrent ascites. Request made for diagnostic and therapeutic paracentesis. EXAM: ULTRASOUND GUIDED DIAGNOSTIC AND THERAPEUTIC PARACENTESIS MEDICATIONS: 1% lidocaine to skin and subcutaneous tissue COMPLICATIONS: None immediate. PROCEDURE: Informed written consent was obtained from the patient after a discussion of the risks, benefits and alternatives to treatment. A timeout was performed prior to the initiation of the procedure. Initial ultrasound scanning demonstrates a small to moderate amount of ascites within the left lower abdominal quadrant. The left lower abdomen was prepped and draped in the usual sterile fashion. 1% lidocaine was used for local anesthesia. Following this, a 19 gauge, 7-cm, Yueh catheter was introduced.  An ultrasound image was saved for documentation purposes. The paracentesis was performed.  The catheter was removed and a dressing was applied. The patient tolerated the procedure well without immediate post procedural complication. FINDINGS: A total of approximately 1.5 liters of amber/blood-tinged fluid was removed. Samples were sent to the laboratory as requested by the clinical team. IMPRESSION: Successful ultrasound-guided diagnostic and therapeutic paracentesis yielding 1.5 liters of peritoneal fluid. Read by: Rowe Eshaan, PA-C Electronically Signed   By: Jacqulynn Cadet M.D.   On: 05/23/2020 15:13   US Paracentesis  Result Date: 05/06/2020 INDICATION: Patient with history of metastatic pancreatic cancer, ascites. Request made for diagnostic and therapeutic paracentesis. EXAM: ULTRASOUND GUIDED DIAGNOSTIC AND THERAPEUTIC PARACENTESIS MEDICATIONS: 1% lidocaine to skin and subcutaneous tissue COMPLICATIONS: None immediate. PROCEDURE: Informed written consent was obtained from the patient after a discussion of the risks, benefits and alternatives to treatment. A timeout was performed prior to the initiation of the procedure. Initial ultrasound scanning demonstrates a small to moderate amount of ascites within the left lower abdominal quadrant. The left lower abdomen was prepped and draped in the usual sterile fashion. 1% lidocaine was used for local anesthesia. Following this, a 19 gauge, 10-cm, Yueh catheter was introduced. An ultrasound image was saved for documentation purposes. The paracentesis was performed. The catheter was removed and a dressing was applied. The patient tolerated the procedure well without immediate post procedural complication. FINDINGS: A total of approximately 1.5 liters of yellow fluid was removed. Samples were sent to the laboratory as requested by the clinical team. IMPRESSION: Successful ultrasound-guided diagnostic and therapeutic paracentesis yielding 1.5 liters of peritoneal fluid. Read by: Rowe Martese, PA-C Electronically Signed   By: Jerilynn Mages.  Shick M.D.   On:  05/06/2020 11:53   DG CHEST PORT 1 VIEW  Result Date: 05/30/2020 CLINICAL DATA:  Hypoxia. EXAM: PORTABLE CHEST 1 VIEW COMPARISON:  Prior chest radiographs 05/30/2020. Chest CT 05/03/2020. FINDINGS: Right chest infusion port catheter with tip projecting in the region of the superior cavoatrial junction. Heart size within normal limits. Mild ill-defined/linear opacities within the left lung base are more pronounced as compared to the chest radiographs performed earlier the same day and favored to reflect atelectasis. The right lung is clear. No evidence of pleural effusion or pneumothorax. Redemonstrated sclerosis within the anterior left fourth rib which is likely posttraumatic in etiology. IMPRESSION: Mild ill-defined/linear opacities within the left lung base are more pronounced as compared to the chest radiographs performed earlier the same day and favored to reflect atelectasis. Redemonstrated sclerosis within the anterior left fourth rib which is likely posttraumatic in etiology. Continued attention recommended on follow-up. Electronically Signed   By: Kellie Simmering DO   On: 05/30/2020 08:20   DG Abd 2 Views  Result Date: 05/17/2020 CLINICAL DATA:  Metastatic pancreatic cancer.  Nausea and vomiting. EXAM: X-RAY ABDOMEN 2 VIEWS COMPARISON:  02/10/2020 FINDINGS: Right upper quadrant stent, consistent with biliary ductal stent, as seen previously. Bowel gas pattern does not show evidence of ileus, obstruction or free air. No significant bone findings. IMPRESSION: Right upper quadrant stent. No evidence of ileus or obstruction. No free air. Electronically Signed   By: Nelson Chimes M.D.   On: 05/17/2020 12:20   DG ABD ACUTE 2+V W 1V CHEST  Result Date: 05/30/2020 CLINICAL DATA:  Nausea, metastatic pancreatic carcinoma EXAM: ACUTE ABDOMEN SERIES (2 VIEW ABDOMEN AND 1 VIEW CHEST) COMPARISON:  Radiograph 05/17/2020, CT 05/03/2020 FINDINGS: Right IJ approach Port-A-Cath tip terminates at the superior  cavoatrial junction. Telemetry  leads overlie the chest. Few streaky likely atelectatic opacities in the lung bases. No consolidation, features of edema, pneumothorax, or effusion. Pulmonary vascularity is normally distributed. The aorta is calcified. The remaining cardiomediastinal contours are unremarkable. A biliary stent is seen in the upper abdomen. No high-grade obstructive bowel gas pattern. Moderate colonic stool burden. No subdiaphragmatic free air. No suspicious calcifications. Increased attenuation of the abdomen may reflect a degree of ascites. No acute or suspicious osseous lesions. Degenerative changes in the spine, shoulders, hips and pelvis. IMPRESSION: 1. Atelectatic changes in the lungs. 2. No high-grade obstructive bowel gas pattern.  No free air. 3. Moderate colonic stool burden. 4. Possible ascites. 5.  Aortic Atherosclerosis (ICD10-I70.0). Electronically Signed   By: Lovena Le M.D.   On: 05/30/2020 00:33   IR Paracentesis  Result Date: 05/16/2020 INDICATION: Patient with a history of pancreatic cancer and ascites presents today for a therapeutic paracentesis. EXAM: ULTRASOUND GUIDED PARACENTESIS MEDICATIONS: 1% lidocaine 10 mL COMPLICATIONS: None immediate. PROCEDURE: Informed written consent was obtained from the patient after a discussion of the risks, benefits and alternatives to treatment. A timeout was performed prior to the initiation of the procedure. Initial ultrasound scanning demonstrates a large amount of ascites within the right lower abdominal quadrant. The right lower abdomen was prepped and draped in the usual sterile fashion. 1% lidocaine was used for local anesthesia. Following this, a 19 gauge, 7-cm, Yueh catheter was introduced. An ultrasound image was saved for documentation purposes. The paracentesis was performed. The catheter was removed and a dressing was applied. The patient tolerated the procedure well without immediate post procedural complication. FINDINGS: A  total of approximately 1 L of clear yellow fluid was removed. IMPRESSION: Successful ultrasound-guided paracentesis yielding 1 liters of peritoneal fluid. Read by: Soyla Dryer, NP Electronically Signed   By: Corrie Mckusick D.O.   On: 05/16/2020 15:13     CT CHEST ABDOMEN PELVIS W CONTRAST  Result Date: 05/03/2020 CLINICAL DATA:  Follow-up metastatic pancreatic carcinoma. Undergoing chemotherapy. EXAM: CT CHEST, ABDOMEN, AND PELVIS WITH CONTRAST TECHNIQUE: Multidetector CT imaging of the chest, abdomen and pelvis was performed following the standard protocol during bolus administration of intravenous contrast. CONTRAST:  13mL OMNIPAQUE IOHEXOL 300 MG/ML  SOLN COMPARISON:  02/10/2020 FINDINGS: CT CHEST FINDINGS Cardiovascular: No acute findings. Mediastinum/Lymph Nodes: Mild multinodular goiter again noted. No other masses or pathologically enlarged lymph nodes identified. Lungs/Pleura: No pulmonary infiltrate or masses identified. New tiny bilateral pleural effusions noted. Musculoskeletal: No suspicious bone lesions identified. Old fracture deformity of the left anterior 4th rib is stable in appearance. CT ABDOMEN AND PELVIS FINDINGS Hepatobiliary: No masses identified. Stable diffuse hepatic steatosis. Stent again seen in the distal common bile duct. Pneumobilia again noted. Pancreas: Soft tissue mass in the pancreatic head surrounding the distal common bile duct stent measures 4.1 x 3.5 cm on image 69/2, without significant change when remeasured in same planes on prior study. Spleen:  Within normal limits in size and appearance. Adrenals/Urinary tract: Stable small low-attenuation left adrenal masses, consistent with benign adenomas. 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 No evidence of renal masses or hydronephrosis. No masses or hydronephrosis. Stomach/Bowel: No evidence of obstruction, inflammatory process, or abnormal fluid collections. Vascular/Lymphatic: Mild  lymphadenopathy is again seen adjacent to the pancreatic head and in the porta hepatis, without significant change since previous study. Largest index lymph node adjacent to the pancreatic head measures 1.5 cm on image 65/2, without significant change. Multiple sub-cm lymph nodes in the  central small bowel mesentery also show no significant change. No new or increased sites of lymphadenopathy identified. No abdominal aortic aneurysm. Reproductive:  No mass or other significant abnormality identified. Other:  New moderate ascites and diffuse mesenteric edema noted. Musculoskeletal:  No suspicious bone lesions identified. IMPRESSION: No significant change in pancreatic head mass surrounding the distal common bile duct stent. Stable mild lymphadenopathy in peripancreatic region, porta hepatis, and central small bowel mesentery, consistent with metastatic disease. New moderate ascites and diffuse mesenteric edema. Stable hepatic steatosis and benign left adrenal adenomas. Electronically Signed   By: Marlaine Hind M.D.   On: 05/03/2020 13:23   US Paracentesis  Result Date: 05/23/2020 INDICATION: Patient with history of metastatic pancreatic cancer, recurrent ascites. Request made for diagnostic and therapeutic paracentesis. EXAM: ULTRASOUND GUIDED DIAGNOSTIC AND THERAPEUTIC PARACENTESIS MEDICATIONS: 1% lidocaine to skin and subcutaneous tissue COMPLICATIONS: None immediate. PROCEDURE: Informed written consent was obtained from the patient after a discussion of the risks, benefits and alternatives to treatment. A timeout was performed prior to the initiation of the procedure. Initial ultrasound scanning demonstrates a small to moderate amount of ascites within the left lower abdominal quadrant. The left lower abdomen was prepped and draped in the usual sterile fashion. 1% lidocaine was used for local anesthesia. Following this, a 19 gauge, 7-cm, Yueh catheter was introduced. An ultrasound image was saved for  documentation purposes. The paracentesis was performed. The catheter was removed and a dressing was applied. The patient tolerated the procedure well without immediate post procedural complication. FINDINGS: A total of approximately 1.5 liters of amber/blood-tinged fluid was removed. Samples were sent to the laboratory as requested by the clinical team. IMPRESSION: Successful ultrasound-guided diagnostic and therapeutic paracentesis yielding 1.5 liters of peritoneal fluid. Read by: Rowe Keene, PA-C Electronically Signed   By: Jacqulynn Cadet M.D.   On: 05/23/2020 15:13   US Paracentesis  Result Date: 05/06/2020 INDICATION: Patient with history of metastatic pancreatic cancer, ascites. Request made for diagnostic and therapeutic paracentesis. EXAM: ULTRASOUND GUIDED DIAGNOSTIC AND THERAPEUTIC PARACENTESIS MEDICATIONS: 1% lidocaine to skin and subcutaneous tissue COMPLICATIONS: None immediate. PROCEDURE: Informed written consent was obtained from the patient after a discussion of the risks, benefits and alternatives to treatment. A timeout was performed prior to the initiation of the procedure. Initial ultrasound scanning demonstrates a small to moderate amount of ascites within the left lower abdominal quadrant. The left lower abdomen was prepped and draped in the usual sterile fashion. 1% lidocaine was used for local anesthesia. Following this, a 19 gauge, 10-cm, Yueh catheter was introduced. An ultrasound image was saved for documentation purposes. The paracentesis was performed. The catheter was removed and a dressing was applied. The patient tolerated the procedure well without immediate post procedural complication. FINDINGS: A total of approximately 1.5 liters of yellow fluid was removed. Samples were sent to the laboratory as requested by the clinical team. IMPRESSION: Successful ultrasound-guided diagnostic and therapeutic paracentesis yielding 1.5 liters of peritoneal fluid. Read by: Rowe Almin,  PA-C Electronically Signed   By: Jerilynn Mages.  Shick M.D.   On: 05/06/2020 11:53   DG CHEST PORT 1 VIEW  Result Date: 05/30/2020 CLINICAL DATA:  Hypoxia. EXAM: PORTABLE CHEST 1 VIEW COMPARISON:  Prior chest radiographs 05/30/2020. Chest CT 05/03/2020. FINDINGS: Right chest infusion port catheter with tip projecting in the region of the superior cavoatrial junction. Heart size within normal limits. Mild ill-defined/linear opacities within the left lung base are more pronounced as compared to the chest radiographs performed earlier the same day  and favored to reflect atelectasis. The right lung is clear. No evidence of pleural effusion or pneumothorax. Redemonstrated sclerosis within the anterior left fourth rib which is likely posttraumatic in etiology. IMPRESSION: Mild ill-defined/linear opacities within the left lung base are more pronounced as compared to the chest radiographs performed earlier the same day and favored to reflect atelectasis. Redemonstrated sclerosis within the anterior left fourth rib which is likely posttraumatic in etiology. Continued attention recommended on follow-up. Electronically Signed   By: Kellie Simmering DO   On: 05/30/2020 08:20   DG Abd 2 Views  Result Date: 05/17/2020 CLINICAL DATA:  Metastatic pancreatic cancer.  Nausea and vomiting. EXAM: X-RAY ABDOMEN 2 VIEWS COMPARISON:  02/10/2020 FINDINGS: Right upper quadrant stent, consistent with biliary ductal stent, as seen previously. Bowel gas pattern does not show evidence of ileus, obstruction or free air. No significant bone findings. IMPRESSION: Right upper quadrant stent. No evidence of ileus or obstruction. No free air. Electronically Signed   By: Nelson Chimes M.D.   On: 05/17/2020 12:20   DG ABD ACUTE 2+V W 1V CHEST  Result Date: 05/30/2020 CLINICAL DATA:  Nausea, metastatic pancreatic carcinoma EXAM: ACUTE ABDOMEN SERIES (2 VIEW ABDOMEN AND 1 VIEW CHEST) COMPARISON:  Radiograph 05/17/2020, CT 05/03/2020 FINDINGS: Right IJ  approach Port-A-Cath tip terminates at the superior cavoatrial junction. Telemetry leads overlie the chest. Few streaky likely atelectatic opacities in the lung bases. No consolidation, features of edema, pneumothorax, or effusion. Pulmonary vascularity is normally distributed. The aorta is calcified. The remaining cardiomediastinal contours are unremarkable. A biliary stent is seen in the upper abdomen. No high-grade obstructive bowel gas pattern. Moderate colonic stool burden. No subdiaphragmatic free air. No suspicious calcifications. Increased attenuation of the abdomen may reflect a degree of ascites. No acute or suspicious osseous lesions. Degenerative changes in the spine, shoulders, hips and pelvis. IMPRESSION: 1. Atelectatic changes in the lungs. 2. No high-grade obstructive bowel gas pattern.  No free air. 3. Moderate colonic stool burden. 4. Possible ascites. 5.  Aortic Atherosclerosis (ICD10-I70.0). Electronically Signed   By: Lovena Le M.D.   On: 05/30/2020 00:33   IR Paracentesis  Result Date: 05/16/2020 INDICATION: Patient with a history of pancreatic cancer and ascites presents today for a therapeutic paracentesis. EXAM: ULTRASOUND GUIDED PARACENTESIS MEDICATIONS: 1% lidocaine 10 mL COMPLICATIONS: None immediate. PROCEDURE: Informed written consent was obtained from the patient after a discussion of the risks, benefits and alternatives to treatment. A timeout was performed prior to the initiation of the procedure. Initial ultrasound scanning demonstrates a large amount of ascites within the right lower abdominal quadrant. The right lower abdomen was prepped and draped in the usual sterile fashion. 1% lidocaine was used for local anesthesia. Following this, a 19 gauge, 7-cm, Yueh catheter was introduced. An ultrasound image was saved for documentation purposes. The paracentesis was performed. The catheter was removed and a dressing was applied. The patient tolerated the procedure well without  immediate post procedural complication. FINDINGS: A total of approximately 1 L of clear yellow fluid was removed. IMPRESSION: Successful ultrasound-guided paracentesis yielding 1 liters of peritoneal fluid. Read by: Soyla Dryer, NP Electronically Signed   By: Corrie Mckusick D.O.   On: 05/16/2020 15:13   Assessment and Plan:  1.  Metastatic pancreatic adenocarcinoma 2.  Nausea and vomiting 3.  Dehydration secondary to #2 4.  Hypokalemia and hyponatremia secondary to #2 5.  Malignant ascites 6.  Protein calorie malnutrition/weight loss   -Due to disease progression, the patient's chemotherapy is going  to be changed this week starting on 9/29.  We will need to delay the start of his chemotherapy until dehydration, nausea, vomiting, electrolyte abnormalities are improved. -Continue as needed antiemetics. -Agree with obtaining a CT of the abdomen/pelvis to evaluate for obstruction. -Continue aggressive IV fluids and aggressive replacement of electrolytes. -The patient does not currently have enough fluid for paracentesis.  Will monitor this and order paracentesis by IR if ascites worsens. -Recommend dietitian consult when he is adequately taking p.o.  Mikey Bussing, DNP, AGPCNP-BC, AOCNP  ADDENDUM: I saw and examined Mr. Kopec.  He is well-known to me.  He has metastatic pancreatic cancer which by his recent scans has been progressing.  We have switched his treatment over to infusional 5-FU with liposomal irinotecan.  He was supposed start this week.  He has had this nausea and vomiting.  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  He had a CT scan done today which showed diffuse mesenteric inflammation.  Not sure exactly what this entails.  I am unsure if this is causing his problems.  He came in with severe electrolyte abnormalities.  His potassium was 2.4.  Chloride was less than 65.  His creatinine was 1.  Calcium 8.6.  Albumin 3.3.  His white cell count 9.4.   Hemoglobin 15.2.  Platelet count 260,000.  He actually looks pretty good.  He sounds good.  I wonder if and upper GI and small bowel follow-through might be able to help [place] out.  Again, I worry about the possibility of pseudoobstruction from his malignancy.  I will know if he needs IV antibiotics for this mesenteric inflammation.  I will see a downside to put him on Flagyl.  He has not had obvious diarrhea.  He has had no bleeding.  There is been no fever.  Again, I just worry that his cancer is progressing.  I know his tumor marker-CA 19-9-is a little bit better.  I appreciate Dr. Lorie Apley input.  He may need an upper GI.  We will follow along.  I appreciate all the great care that he is getting from all the staff on [address].  Lattie Haw, MD  Jeneen Rinks 1:5

## 2020-05-30 NOTE — Progress Notes (Signed)
PT Cancellation Note  Patient Details Name: Jeffrey Harris MRN: [id number] DOB: [birth year]   Cancelled Treatment:    Reason Eval/Treat Not Completed: Medical issues which prohibited therapy Pt with low BP and would prefer to wait.  Will check back as schedule permits.    Adonias Demore,KATHrine E 05/30/2020, 11:44 AM  Arlyce Dice, DPT Acute Rehabilitation Services Pager: [phone] Office: [phone]

## 2020-05-30 NOTE — Consult Note (Signed)
UNASSIGNED PATIENT Reason for Consult: Severe nausea and vomiting-metastatic pancreatic cancer. Referring Physician: [facility] hospitalist Dr. Loraine Leriche.  Jeffrey Harris is an 64 y.o. male.  HPI: [name] a 65 year old male with medical problems listed below who has been followed by Dr. Burney Gauze for his pancreatic cancer.  Patient developed malignant ascites recently and has had 3 paracentesis in September of this year. He last received Gemzar and Abraxane on 05/10/2020.  He claims for the last 4 weeks he has "not been able to keep anything down" and consequently has developed severe weakness lethargy and lightheadedness. He was found to be profoundly volume depleted emergency room with moderate hyponatremia severe hypokalemia. Patient was aggressively resuscitated and Hospice has been called for evaluation.  Patient claims his symptoms started after he got" new medications" first chemo 4 weeks ago. He denies any dysphagia or odynophagia but has some retrosternal discomfort when he swallows.  He denies having any melena hematochezia.  He is never had a colonoscopy.  Past Medical History:  Diagnosis Date  . Gout   . Hyperbilirubinemia 07/2019  . Pancreatic cancer metastasized to liver [facility]) 07/29/2019   Past Surgical History:  Procedure Laterality Date  . BIOPSY  07/25/2019   Procedure: BIOPSY;  Surgeon: Ronnette Juniper, MD;  Location: [facility] ENDOSCOPY;  Service: Gastroenterology;;  . ESOPHAGOGASTRODUODENOSCOPY  07/25/2019   Procedure: ESOPHAGOGASTRODUODENOSCOPY (EGD);  Surgeon: Ronnette Juniper, MD;  Location: [place];  Service: Gastroenterology;;  . IR BILIARY STENT(S) EXISTING ACCESS INC DILATION CATH EXCHANGE  08/27/2019  . IR CHOLANGIOGRAM EXISTING TUBE  09/01/2019  . IR CONVERT BILIARY DRAIN TO INT EXT BILIARY DRAIN  08/27/2019  . IR IMAGING GUIDED PORT INSERTION  08/06/2019  . IR INT EXT BILIARY DRAIN WITH CHOLANGIOGRAM  07/26/2019  . IR PARACENTESIS  05/16/2020  . IR RADIOLOGIST EVAL &  MGMT  08/05/2019  . IR REMOVAL BILIARY DRAIN  09/01/2019  . KNEE ARTHROSCOPY      Family History  Problem Relation Age of Onset  . Diabetes Father     Social History:  reports that he has never smoked. He quit smokeless tobacco use about 7 years ago.  His smokeless tobacco use included snuff. He reports that he does not drink alcohol and does not use drugs.  Allergies: No Known Allergies  Medications: I have reviewed the patient's current medications.  Results for orders placed or performed during the hospital encounter of 05/29/20 (from the past 48 hour(s))  CBG monitoring, ED     Status: Abnormal   Collection Time: 05/29/20 10:45 PM  Result Value Ref Range   Glucose-Capillary 137 (H) 70 - 99 mg/dL    Comment: Glucose reference range applies only to samples taken after fasting for at least 8 hours.  Lipase, blood     Status: None   Collection Time: 05/29/20 10:47 PM  Result Value Ref Range   Lipase 29 11 - 51 U/L    Comment: Performed at [facility], [place] [address] [place], [place] [address]  Comprehensive metabolic panel     Status: Abnormal   Collection Time: 05/29/20 10:47 PM  Result Value Ref Range   Sodium 127 (L) 135 - 145 mmol/L   Potassium 2.4 (LL) 3.5 - 5.1 mmol/L    Comment: CRITICAL RESULT CALLED TO, READ BACK BY AND VERIFIED WITH: A,HODGES AT 2334 ON 05/29/20 BY A,MOHAMED    Chloride <65 (LL) 98 - 111 mmol/L    Comment: CRITICAL RESULT CALLED TO, READ BACK BY AND  VERIFIED WITH: A,HODGES AT 2334 ON 05/29/20 BY A,MOHAMED    CO2 42 (H) 22 - 32 mmol/L   Glucose, Bld 118 (H) 70 - 99 mg/dL    Comment: Glucose reference range applies only to samples taken after fasting for at least 8 hours.   BUN 19 8 - 23 mg/dL   Creatinine, Ser 1.01 0.61 - 1.24 mg/dL   Calcium 8.6 (L) 8.9 - 10.3 mg/dL   Total Protein 7.3 6.5 - 8.1 g/dL   Albumin 3.3 (L) 3.5 - 5.0 g/dL   AST 39 15 - 41 U/L   ALT 20 0 - 44 U/L   Alkaline Phosphatase 182 (H) 38 - 126 U/L    Total Bilirubin 1.8 (H) 0.3 - 1.2 mg/dL   GFR calc non Af Amer >60 >60 mL/min   GFR calc Af Amer >60 >60 mL/min   Anion gap NOT CALCULATED 5 - 15    Comment: Performed at [facility], [place] [address], [place], [place] [address]  CBC     Status: Abnormal   Collection Time: 05/29/20 10:47 PM  Result Value Ref Range   WBC 9.4 4.0 - 10.5 K/uL   RBC 4.52 4.22 - 5.81 MIL/uL   Hemoglobin 15.2 13.0 - 17.0 g/dL   HCT 42.4 39 - 52 %   MCV 93.8 80.0 - 100.0 fL   MCH 33.6 26.0 - 34.0 pg   MCHC 35.8 30.0 - 36.0 g/dL   RDW 18.8 (H) 11.5 - 15.5 %   Platelets 260 150 - 400 K/uL   nRBC 0.0 0.0 - 0.2 %    Comment: Performed at [facility], [place] [address] [place], [place] [address]  Magnesium     Status: None   Collection Time: 05/29/20 10:47 PM  Result Value Ref Range   Magnesium 2.4 1.7 - 2.4 mg/dL    Comment: Performed at [facility], [place] [address] [place], [place] [address]  Respiratory Panel by RT PCR (Flu A&B, Covid) - Nasopharyngeal Swab     Status: None   Collection Time: 05/30/20  1:40 AM   Specimen: Nasopharyngeal Swab  Result Value Ref Range   SARS Coronavirus 2 by RT PCR NEGATIVE NEGATIVE    Comment: (NOTE) SARS-CoV-2 target nucleic acids are NOT DETECTED.  The SARS-CoV-2 RNA is generally detectable in upper respiratoy specimens during the acute phase of infection. The lowest concentration of SARS-CoV-2 viral copies this assay can detect is 131 copies/mL. A negative result does not preclude SARS-Cov-2 infection and should not be used as the sole basis for treatment or other patient management decisions. A negative result may occur with  improper specimen collection/handling, submission of specimen other than nasopharyngeal swab, presence of viral mutation(s) within the areas targeted by this assay, and inadequate number of viral copies (<131 copies/mL). A negative result must be combined with clinical observations,  patient history, and epidemiological information. The expected result is Negative.  Fact Sheet for Patients:  PinkCheek.be  Fact Sheet for Healthcare Providers:  GravelBags.it  This test is no t yet approved or cleared by the [place] FDA and  has been authorized for detection and/or diagnosis of SARS-CoV-2 by FDA under an Emergency Use Authorization (EUA). This EUA will remain  in effect (meaning this test can be used) for the duration of the COVID-19 declaration under Section 564(b)(1) of the Act, 21 U.S.C. section 360bbb-3(b)(1), unless the authorization is terminated or revoked sooner.     Influenza A by  PCR NEGATIVE NEGATIVE   Influenza B by PCR NEGATIVE NEGATIVE    Comment: (NOTE) The Xpert Xpress SARS-CoV-2/FLU/RSV assay is intended as an aid in  the diagnosis of influenza from Nasopharyngeal swab specimens and  should not be used as a sole basis for treatment. Nasal washings and  aspirates are unacceptable for Xpert Xpress SARS-CoV-2/FLU/RSV  testing.  Fact Sheet for Patients: PinkCheek.be  Fact Sheet for Healthcare Providers: GravelBags.it  This test is not yet approved or cleared by the [place] FDA and  has been authorized for detection and/or diagnosis of SARS-CoV-2 by  FDA under an Emergency Use Authorization (EUA). This EUA will remain  in effect (meaning this test can be used) for the duration of the  Covid-19 declaration under Section 564(b)(1) of the Act, 21  U.S.C. section 360bbb-3(b)(1), unless the authorization is  terminated or revoked. Performed at [facility], [place] [address], [place], [place] [address]   Basic metabolic panel     Status: Abnormal   Collection Time: 05/30/20  6:14 AM  Result Value Ref Range   Sodium 126 (L) 135 - 145 mmol/L   Potassium 2.6 (LL) 3.5 - 5.1 mmol/L    Comment: CRITICAL RESULT  CALLED TO, READ BACK BY AND VERIFIED WITH: DOWD,P @ 0750 ON [id number] BY POTEAT,S    Chloride 66 (L) 98 - 111 mmol/L   CO2 44 (H) 22 - 32 mmol/L   Glucose, Bld 110 (H) 70 - 99 mg/dL    Comment: Glucose reference range applies only to samples taken after fasting for at least 8 hours.   BUN 17 8 - 23 mg/dL   Creatinine, Ser 0.81 0.61 - 1.24 mg/dL   Calcium 7.4 (L) 8.9 - 10.3 mg/dL   GFR calc non Af Amer >60 >60 mL/min   GFR calc Af Amer >60 >60 mL/min   Anion gap 16 (H) 5 - 15    Comment: Performed at [facility], [place] [address], [place], [place] [address]  CBC     Status: Abnormal   Collection Time: 05/30/20  6:14 AM  Result Value Ref Range   WBC 6.8 4.0 - 10.5 K/uL   RBC 3.54 (L) 4.22 - 5.81 MIL/uL   Hemoglobin 12.0 (L) 13.0 - 17.0 g/dL   HCT 34.2 (L) 39 - 52 %   MCV 96.6 80.0 - 100.0 fL   MCH 33.9 26.0 - 34.0 pg   MCHC 35.1 30.0 - 36.0 g/dL   RDW 18.9 (H) 11.5 - 15.5 %   Platelets 155 150 - 400 K/uL   nRBC 0.0 0.0 - 0.2 %    Comment: Performed at [facility], [place] [address], [place],  [address]  Basic metabolic panel     Status: Abnormal   Collection Time: 05/30/20 11:47 AM  Result Value Ref Range   Sodium 126 (L) 135 - 145 mmol/L   Potassium 3.1 (L) 3.5 - 5.1 mmol/L   Chloride 72 (L) 98 - 111 mmol/L   CO2 40 (H) 22 - 32 mmol/L   Glucose, Bld 89 70 - 99 mg/dL    Comment: Glucose reference range applies only to samples taken after fasting for at least 8 hours.   BUN 17 8 - 23 mg/dL   Creatinine, Ser 0.76 0.61 - 1.24 mg/dL   Calcium 7.2 (L) 8.9 - 10.3 mg/dL   GFR calc non Af Amer >60 >60 mL/min   GFR calc Af Amer >60 >60 mL/min   Anion  gap 14 5 - 15    Comment: Performed at [facility], [place] [address], [place], [place] [address]  Osmolality     Status: Abnormal   Collection Time: 05/30/20 11:47 AM  Result Value Ref Range   Osmolality 264 (L) 275 - 295 mOsm/kg    Comment: Performed at [place], [place] [address], [place], [place] [address]  Urinalysis, Routine w reflex microscopic     Status: Abnormal   Collection Time: 05/30/20 11:56 AM  Result Value Ref Range   Color, Urine YELLOW YELLOW   APPearance CLEAR CLEAR   Specific Gravity, Urine 1.021 1.005 - 1.030   pH 8.0 5.0 - 8.0   Glucose, UA NEGATIVE NEGATIVE mg/dL   Hgb urine dipstick NEGATIVE NEGATIVE   Bilirubin Urine SMALL (A) NEGATIVE   Ketones, ur 80 (A) NEGATIVE mg/dL   Protein, ur 30 (A) NEGATIVE mg/dL   Nitrite NEGATIVE NEGATIVE   Leukocytes,Ua NEGATIVE NEGATIVE   RBC / HPF 0-5 0 - 5 RBC/hpf   WBC, UA 0-5 0 - 5 WBC/hpf   Bacteria, UA RARE (A) NONE SEEN   Mucus PRESENT    Hyaline Casts, UA PRESENT     Comment: Performed at [facility], [place] [address], [place], [place] [address]  Osmolality, urine     Status: None   Collection Time: 05/30/20 11:56 AM  Result Value Ref Range   Osmolality, Ur 682 300 - 900 mOsm/kg    Comment: Performed at [place] [address] [place], [place] [address]  Sodium, urine, random     Status: None   Collection Time: 05/30/20 11:56 AM  Result Value Ref Range   Sodium, Ur 144 mmol/L    Comment: Performed at [facility], [place] [address], [place], [place] [address]  Lactic acid, plasma     Status: None   Collection Time: 05/30/20 12:48 PM  Result Value Ref Range   Lactic Acid, Venous 0.9 0.5 - 1.9 mmol/L    Comment: Performed at [facility], [place] [address] [place], [place] [address]    DG CHEST PORT 1 VIEW  Result Date: 05/30/2020 CLINICAL DATA:  Hypoxia. EXAM: PORTABLE CHEST 1 VIEW COMPARISON:  Prior chest radiographs 05/30/2020. Chest CT 05/03/2020. FINDINGS: Right chest infusion port catheter with tip projecting in the region of the superior cavoatrial junction. Heart size within normal limits. Mild ill-defined/linear opacities within the left lung base are more pronounced as compared to the chest radiographs  performed earlier the same day and favored to reflect atelectasis. The right lung is clear. No evidence of pleural effusion or pneumothorax. Redemonstrated sclerosis within the anterior left fourth rib which is likely posttraumatic in etiology. IMPRESSION: Mild ill-defined/linear opacities within the left lung base are more pronounced as compared to the chest radiographs performed earlier the same day and favored to reflect atelectasis. Redemonstrated sclerosis within the anterior left fourth rib which is likely posttraumatic in etiology. Continued attention recommended on follow-up. Electronically Signed   By: Kellie Simmering DO   On: 05/30/2020 08:20   DG ABD ACUTE 2+V W 1V CHEST  Result Date: 05/30/2020 CLINICAL DATA:  Nausea, metastatic pancreatic carcinoma EXAM: ACUTE ABDOMEN SERIES (2 VIEW ABDOMEN AND 1 VIEW CHEST) COMPARISON:  Radiograph 05/17/2020, CT 05/03/2020 FINDINGS: Right IJ approach Port-A-Cath tip terminates at the superior cavoatrial junction. Telemetry leads overlie the chest. Few streaky likely atelectatic opacities in the lung bases. No consolidation, features of edema, pneumothorax,  or effusion. Pulmonary vascularity is normally distributed. The aorta is calcified. The remaining cardiomediastinal contours are unremarkable. A biliary stent is seen in the upper abdomen. No high-grade obstructive bowel gas pattern. Moderate colonic stool burden. No subdiaphragmatic free air. No suspicious calcifications. Increased attenuation of the abdomen may reflect a degree of ascites. No acute or suspicious osseous lesions. Degenerative changes in the spine, shoulders, hips and pelvis. IMPRESSION: 1. Atelectatic changes in the lungs. 2. No high-grade obstructive bowel gas pattern.  No free air. 3. Moderate colonic stool burden. 4. Possible ascites. 5.  Aortic Atherosclerosis (ICD10-I70.0). Electronically Signed   By: Lovena Le M.D.   On: 05/30/2020 00:33   Review of Systems  Constitutional: Positive for  activity change, appetite change, fatigue and unexpected weight change. Negative for chills, diaphoresis and fever.  HENT: Negative.   Eyes: Negative.   Respiratory: Negative.   Cardiovascular: Negative.   Gastrointestinal: Positive for nausea and vomiting. Negative for abdominal distention, abdominal pain, anal bleeding, blood in stool and constipation.  Musculoskeletal: Negative.   Skin: Positive for color change and pallor. Negative for rash.  Neurological: Positive for dizziness, weakness and light-headedness. Negative for tremors, seizures, facial asymmetry, speech difficulty, numbness and headaches.  Hematological: Negative.   Psychiatric/Behavioral: Negative.    Blood pressure 103/73, pulse 66, temperature 97.9 F (36.6 C), temperature source Oral, resp. rate 15, SpO2 100 %. Physical Exam Vitals reviewed.  Constitutional:      Appearance: Normal appearance. He is ill-appearing.  HENT:     Head: Normocephalic and atraumatic.     Nose: Nose normal.     Mouth/Throat:     Mouth: Mucous membranes are dry.  Eyes:     Extraocular Movements: Extraocular movements intact.     Pupils: Pupils are equal, round, and reactive to light.  Pulmonary:     Effort: Pulmonary effort is normal.     Breath sounds: Normal breath sounds.  Abdominal:     General: Bowel sounds are normal. There is no distension.     Palpations: Abdomen is soft.     Tenderness: There is no abdominal tenderness. There is no guarding or rebound.  Musculoskeletal:        General: Normal range of motion.  Skin:    General: Skin is warm and dry.  Neurological:     General: No focal deficit present.     Mental Status: He is alert and oriented to person, place, and time.    Assessment/Plan: Severe nausea and vomiting with profound high dehydration hypokalemia hyponatremia in the setting of metastatic pancreatic carcinoma with diffuse mesenteric fat stranding noted on a CT scan done earlier today along with persistent  enlargement the pancreas pancreatic head and distal CBD stent in place also noted was mild amount of perihepatic and perisplenic fluid slightly decreased when compared to prior exam along with low-attenuation left adrenal masses question adrenal adenomas with mild to moderate amount of free fluid in the pelvis and mild left upper lobe atelectasis; there seems to be no evidence of a gastric outlet obstruction and therefore is not exactly clear why the patient is having the symptoms.  Diffuse mesenteritis has been described by the radiologist.  Agree with IV PPIs for now and if his symptoms do not improve an EGD might be required.  Juanita Craver 05/30/2020, 3:27 PM

## 2020-05-30 NOTE — Progress Notes (Addendum)
[facility]                                                                              Patient Demographics  Jeffrey Harris, is a 64 y.o. male, DOB - 1955/12/06, CHE:[id number]  Admit date - 05/29/2020   Admitting Physician Vernelle Emerald, MD  Outpatient Primary MD for the patient is [name], MD  Outpatient specialists:   LOS - 0  days   Medical records reviewed and are as summarized below:    Chief Complaint  Patient presents with  . 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 Marin Olp, history of gout presented to [facility], ED with generalized weakness, intractable nausea and vomiting.  Patient reported that for the past 4 weeks he has been bouts of nausea and vomiting.  Described as nonbilious, nonbloody vomiting typically after attempting to eat solids.  Patient ported experiencing numerous episodes daily.  Also reported generalized weakness, lightheadedness upon standing up.  Denied any fevers, diarrhea, sick contacts, recent travel.  Recently diagnosed with malignant ascites, undergone paracentesis 3 times since diagnosed on September 3.  Patient last received Gemzar and Abraxane on 9/7 Patient was found to be profoundly volume depleted in ED with moderate hyponatremia, severe hypokalemia, hypochloremia and alkalosis.  Patient was admitted for further work-up   Assessment & Plan    Principal Problem: Severe electrolyte abnormalities hypokalemia, hyponatremia, hypochloremia secondary to gastrointestinal losses with intractable nausea and vomiting -Potassium on admission 2.4, sodium 127, chloride less than 65, CO2 42 -Patient placed on aggressive IV fluid hydration, potassium replacement -Repeat labs showed mildly improving potassium to 2.6, repleted again -Continue IV fluid hydration, follow repeat labs, in  process -Follow serum osmolality, urine osmolality, UNA, lactic acid.  -Calculated serum osmolality 264 -At the time of my examination, no active vomiting.  Abdominal x-ray showed no high-grade obstructive bowel gas pattern, no free air, moderate colonic stool burden -Per wife, he cannot keep anything down for a month, vomiting worsening cannot keep anything down per his wife.  I have also requested GI to see him, discussed with Dr. Collene Mares, will obtain CT abdomen pelvis to rule out any obstruction due to pancreatic CA  Active Problems: Severe dehydration -UA positive for ketones, placed on aggressive IV fluid hydration  Hypotension, likely due to hypovolemic shock from intractable nausea and vomiting/GI losses -At the time of my encounter, SBP was in the 80s, placed on IV fluid bolus, followed by normal saline with potassium replacement -Follow-up cortisol level, albumin is mildly low 3.3.  Follow lactic acid -BP currently improving, will follow closely    Pancreatic cancer metastasized to liver [facility]), with recent diagnosis of malignant ascites status post paracentesis -Oncology has been consulted, will await further recommendations from Dr. Marin Olp -Patient last received chemotherapy with Gemzar and Abraxane on 9/7    Malignant ascites -Last paracentesis on 9/20, for now hold off on paracentesis    Prolonged QT interval -EKG showed prolonged QTC 527, aggressively replete potassium, keep above 4 -Magnesium currently  stable, 2.4 -Avoid QT prolonging meds  Constipation - will place on bowel regimen once tolerating p.o. diet  Failure to thrive Estimated body mass index is 24.41 kg/m as calculated from the following:   Height as of 05/10/20: 5\' 11"  (1.803 m).   Weight as of 05/17/20: 79.4 kg.  Code Status: Full CODE STATUS DVT Prophylaxis:  Lovenox  Family Communication: Discussed all imaging results, lab results, explained to the patient    Disposition Plan:     Status is:  Observation  The patient will require care spanning > 2 midnights and should be moved to inpatient because: IV treatments appropriate due to intensity of illness or inability to take PO  Dispo: The patient is from: Home              Anticipated d/c is to: TBD              Anticipated d/c date is: 2 days              Patient currently is not medically stable to d/c.  Intractable nausea and vomiting with this severe electrolyte abnormalities, failure to thrive      Time Spent in minutes 35 minutes, examination, chart review and management, discussion with patient's wife for update, oncology and GI consultation  Procedures:  None  Consultants:   Oncology, Dr. Marin Olp  Antimicrobials:   Anti-infectives (From admission, onward)   None         Medications  Scheduled Meds: . enoxaparin (LOVENOX) injection  40 mg Subcutaneous Q24H  . gabapentin  300 mg Oral TID  . lipase/protease/amylase  72,000 Units Oral TID AC  . pantoprazole (PROTONIX) IV  40 mg Intravenous Q12H  . tamsulosin  0.4 mg Oral Daily   Continuous Infusions: . sodium chloride 0.9 % 1,000 mL with potassium chloride 40 mEq infusion 100 mL/hr at 05/30/20 0920   PRN Meds:.acetaminophen **OR** acetaminophen, oxyCODONE-acetaminophen, prochlorperazine      Subjective:   Jeffrey Harris was seen and examined today.  Nauseous but not actively vomiting.  No fevers or chills.  BP somewhat low in the 80s at the time of my examination.  No dizziness or lightheadedness.  Patient denies chest pain, shortness of breath, new weakness, numbess, tingling.   Objective:   Vitals:   05/30/20 1000 05/30/20 1030 05/30/20 1032 05/30/20 1145  BP: 90/63 106/78 105/78 91/66  Pulse: 67 73 63 74  Resp: 20 15 18 20   Temp:   97.9 F (36.6 C)   TempSrc:   Oral   SpO2: (!) 73% 97% 100% 100%    Intake/Output Summary (Last 24 hours) at 05/30/2020 1237 Last data filed at 05/30/2020 0240 Gross per 24 hour  Intake 143.33 ml  Output --   Net 143.33 ml     Wt Readings from Last 3 Encounters:  05/17/20 79.4 kg  05/10/20 85.3 kg  04/20/20 83.9 kg     Exam  General: Alert and oriented x 3, NAD, cachectic, ill-appearing  Cardiovascular: S1 S2 auscultated, no murmurs, RRR  Respiratory: Clear to auscultation bilaterally, no wheezing, rales or rhonchi  Gastrointestinal: Soft, nontender, nondistended, + bowel sounds  Ext: no pedal edema bilaterally  Neuro: no new deficits  Musculoskeletal: No digital cyanosis, clubbing  Skin: No rashes  Psych: Normal affect and demeanor, alert and oriented x3    Data Reviewed:  I have personally reviewed following labs and imaging studies  Micro Results Recent Results (from the past 240 hour(s))  Respiratory Panel by RT  PCR (Flu A&B, Covid) - Nasopharyngeal Swab     Status: None   Collection Time: 05/30/20  1:40 AM   Specimen: Nasopharyngeal Swab  Result Value Ref Range Status   SARS Coronavirus 2 by RT PCR NEGATIVE NEGATIVE Final    Comment: (NOTE) SARS-CoV-2 target nucleic acids are NOT DETECTED.  The SARS-CoV-2 RNA is generally detectable in upper respiratoy specimens during the acute phase of infection. The lowest concentration of SARS-CoV-2 viral copies this assay can detect is 131 copies/mL. A negative result does not preclude SARS-Cov-2 infection and should not be used as the sole basis for treatment or other patient management decisions. A negative result may occur with  improper specimen collection/handling, submission of specimen other than nasopharyngeal swab, presence of viral mutation(s) within the areas targeted by this assay, and inadequate number of viral copies (<131 copies/mL). A negative result must be combined with clinical observations, patient history, and epidemiological information. The expected result is Negative.  Fact Sheet for Patients:  PinkCheek.be  Fact Sheet for Healthcare Providers:   GravelBags.it  This test is no t yet approved or cleared by the [place] FDA and  has been authorized for detection and/or diagnosis of SARS-CoV-2 by FDA under an Emergency Use Authorization (EUA). This EUA will remain  in effect (meaning this test can be used) for the duration of the COVID-19 declaration under Section 564(b)(1) of the Act, 21 U.S.C. section 360bbb-3(b)(1), unless the authorization is terminated or revoked sooner.     Influenza A by PCR NEGATIVE NEGATIVE Final   Influenza B by PCR NEGATIVE NEGATIVE Final    Comment: (NOTE) The Xpert Xpress SARS-CoV-2/FLU/RSV assay is intended as an aid in  the diagnosis of influenza from Nasopharyngeal swab specimens and  should not be used as a sole basis for treatment. Nasal washings and  aspirates are unacceptable for Xpert Xpress SARS-CoV-2/FLU/RSV  testing.  Fact Sheet for Patients: PinkCheek.be  Fact Sheet for Healthcare Providers: GravelBags.it  This test is not yet approved or cleared by the [place] FDA and  has been authorized for detection and/or diagnosis of SARS-CoV-2 by  FDA under an Emergency Use Authorization (EUA). This EUA will remain  in effect (meaning this test can be used) for the duration of the  Covid-19 declaration under Section 564(b)(1) of the Act, 21  U.S.C. section 360bbb-3(b)(1), unless the authorization is  terminated or revoked. Performed at [facility], [place] [address], [place], [place] [address]     Radiology Reports CT CHEST ABDOMEN PELVIS W CONTRAST  Result Date: 05/03/2020 CLINICAL DATA:  Follow-up metastatic pancreatic carcinoma. Undergoing chemotherapy. EXAM: CT CHEST, ABDOMEN, AND PELVIS WITH CONTRAST TECHNIQUE: Multidetector CT imaging of the chest, abdomen and pelvis was performed following the standard protocol during bolus administration of intravenous contrast.  CONTRAST:  177mL OMNIPAQUE IOHEXOL 300 MG/ML  SOLN COMPARISON:  02/10/2020 FINDINGS: CT CHEST FINDINGS Cardiovascular: No acute findings. Mediastinum/Lymph Nodes: Mild multinodular goiter again noted. No other masses or pathologically enlarged lymph nodes identified. Lungs/Pleura: No pulmonary infiltrate or masses identified. New tiny bilateral pleural effusions noted. Musculoskeletal: No suspicious bone lesions identified. Old fracture deformity of the left anterior 4th rib is stable in appearance. CT ABDOMEN AND PELVIS FINDINGS Hepatobiliary: No masses identified. Stable diffuse hepatic steatosis. Stent again seen in the distal common bile duct. Pneumobilia again noted. Pancreas: Soft tissue mass in the pancreatic head surrounding the distal common bile duct stent measures 4.1 x 3.5 cm on image 69/2, without significant change when remeasured  in same planes on prior study. Spleen:  Within normal limits in size and appearance. Adrenals/Urinary tract: Stable small low-attenuation left adrenal masses, consistent with benign adenomas. 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 No evidence of renal masses or hydronephrosis. No masses or hydronephrosis. Stomach/Bowel: No evidence of obstruction, inflammatory process, or abnormal fluid collections. Vascular/Lymphatic: Mild lymphadenopathy is again seen adjacent to the pancreatic head and in the porta hepatis, without significant change since previous study. Largest index lymph node adjacent to the pancreatic head measures 1.5 cm on image 65/2, without significant change. Multiple sub-cm lymph nodes in the central small bowel mesentery also show no significant change. No new or increased sites of lymphadenopathy identified. No abdominal aortic aneurysm. Reproductive:  No mass or other significant abnormality identified. Other:  New moderate ascites and diffuse mesenteric edema noted. Musculoskeletal:  No suspicious bone lesions identified.  IMPRESSION: No significant change in pancreatic head mass surrounding the distal common bile duct stent. Stable mild lymphadenopathy in peripancreatic region, porta hepatis, and central small bowel mesentery, consistent with metastatic disease. New moderate ascites and diffuse mesenteric edema. Stable hepatic steatosis and benign left adrenal adenomas. Electronically Signed   By: Marlaine Hind M.D.   On: 05/03/2020 13:23   US Paracentesis  Result Date: 05/23/2020 INDICATION: Patient with history of metastatic pancreatic cancer, recurrent ascites. Request made for diagnostic and therapeutic paracentesis. EXAM: ULTRASOUND GUIDED DIAGNOSTIC AND THERAPEUTIC PARACENTESIS MEDICATIONS: 1% lidocaine to skin and subcutaneous tissue COMPLICATIONS: None immediate. PROCEDURE: Informed written consent was obtained from the patient after a discussion of the risks, benefits and alternatives to treatment. A timeout was performed prior to the initiation of the procedure. Initial ultrasound scanning demonstrates a small to moderate amount of ascites within the left lower abdominal quadrant. The left lower abdomen was prepped and draped in the usual sterile fashion. 1% lidocaine was used for local anesthesia. Following this, a 19 gauge, 7-cm, Yueh catheter was introduced. An ultrasound image was saved for documentation purposes. The paracentesis was performed. The catheter was removed and a dressing was applied. The patient tolerated the procedure well without immediate post procedural complication. FINDINGS: A total of approximately 1.5 liters of amber/blood-tinged fluid was removed. Samples were sent to the laboratory as requested by the clinical team. IMPRESSION: Successful ultrasound-guided diagnostic and therapeutic paracentesis yielding 1.5 liters of peritoneal fluid. Read by: Rowe Latham, PA-C Electronically Signed   By: Jacqulynn Cadet M.D.   On: 05/23/2020 15:13   US Paracentesis  Result Date: 05/06/2020 INDICATION:  Patient with history of metastatic pancreatic cancer, ascites. Request made for diagnostic and therapeutic paracentesis. EXAM: ULTRASOUND GUIDED DIAGNOSTIC AND THERAPEUTIC PARACENTESIS MEDICATIONS: 1% lidocaine to skin and subcutaneous tissue COMPLICATIONS: None immediate. PROCEDURE: Informed written consent was obtained from the patient after a discussion of the risks, benefits and alternatives to treatment. A timeout was performed prior to the initiation of the procedure. Initial ultrasound scanning demonstrates a small to moderate amount of ascites within the left lower abdominal quadrant. The left lower abdomen was prepped and draped in the usual sterile fashion. 1% lidocaine was used for local anesthesia. Following this, a 19 gauge, 10-cm, Yueh catheter was introduced. An ultrasound image was saved for documentation purposes. The paracentesis was performed. The catheter was removed and a dressing was applied. The patient tolerated the procedure well without immediate post procedural complication. FINDINGS: A total of approximately 1.5 liters of yellow fluid was removed. Samples were sent to the laboratory as requested by the clinical team.  IMPRESSION: Successful ultrasound-guided diagnostic and therapeutic paracentesis yielding 1.5 liters of peritoneal fluid. Read by: Rowe Socrates, PA-C Electronically Signed   By: Jerilynn Mages.  Shick M.D.   On: 05/06/2020 11:53   DG CHEST PORT 1 VIEW  Result Date: 05/30/2020 CLINICAL DATA:  Hypoxia. EXAM: PORTABLE CHEST 1 VIEW COMPARISON:  Prior chest radiographs 05/30/2020. Chest CT 05/03/2020. FINDINGS: Right chest infusion port catheter with tip projecting in the region of the superior cavoatrial junction. Heart size within normal limits. Mild ill-defined/linear opacities within the left lung base are more pronounced as compared to the chest radiographs performed earlier the same day and favored to reflect atelectasis. The right lung is clear. No evidence of pleural effusion or  pneumothorax. Redemonstrated sclerosis within the anterior left fourth rib which is likely posttraumatic in etiology. IMPRESSION: Mild ill-defined/linear opacities within the left lung base are more pronounced as compared to the chest radiographs performed earlier the same day and favored to reflect atelectasis. Redemonstrated sclerosis within the anterior left fourth rib which is likely posttraumatic in etiology. Continued attention recommended on follow-up. Electronically Signed   By: Kellie Simmering DO   On: 05/30/2020 08:20   DG Abd 2 Views  Result Date: 05/17/2020 CLINICAL DATA:  Metastatic pancreatic cancer.  Nausea and vomiting. EXAM: X-RAY ABDOMEN 2 VIEWS COMPARISON:  02/10/2020 FINDINGS: Right upper quadrant stent, consistent with biliary ductal stent, as seen previously. Bowel gas pattern does not show evidence of ileus, obstruction or free air. No significant bone findings. IMPRESSION: Right upper quadrant stent. No evidence of ileus or obstruction. No free air. Electronically Signed   By: Nelson Chimes M.D.   On: 05/17/2020 12:20   DG ABD ACUTE 2+V W 1V CHEST  Result Date: 05/30/2020 CLINICAL DATA:  Nausea, metastatic pancreatic carcinoma EXAM: ACUTE ABDOMEN SERIES (2 VIEW ABDOMEN AND 1 VIEW CHEST) COMPARISON:  Radiograph 05/17/2020, CT 05/03/2020 FINDINGS: Right IJ approach Port-A-Cath tip terminates at the superior cavoatrial junction. Telemetry leads overlie the chest. Few streaky likely atelectatic opacities in the lung bases. No consolidation, features of edema, pneumothorax, or effusion. Pulmonary vascularity is normally distributed. The aorta is calcified. The remaining cardiomediastinal contours are unremarkable. A biliary stent is seen in the upper abdomen. No high-grade obstructive bowel gas pattern. Moderate colonic stool burden. No subdiaphragmatic free air. No suspicious calcifications. Increased attenuation of the abdomen may reflect a degree of ascites. No acute or suspicious osseous  lesions. Degenerative changes in the spine, shoulders, hips and pelvis. IMPRESSION: 1. Atelectatic changes in the lungs. 2. No high-grade obstructive bowel gas pattern.  No free air. 3. Moderate colonic stool burden. 4. Possible ascites. 5.  Aortic Atherosclerosis (ICD10-I70.0). Electronically Signed   By: Lovena Le M.D.   On: 05/30/2020 00:33   IR Paracentesis  Result Date: 05/16/2020 INDICATION: Patient with a history of pancreatic cancer and ascites presents today for a therapeutic paracentesis. EXAM: ULTRASOUND GUIDED PARACENTESIS MEDICATIONS: 1% lidocaine 10 mL COMPLICATIONS: None immediate. PROCEDURE: Informed written consent was obtained from the patient after a discussion of the risks, benefits and alternatives to treatment. A timeout was performed prior to the initiation of the procedure. Initial ultrasound scanning demonstrates a large amount of ascites within the right lower abdominal quadrant. The right lower abdomen was prepped and draped in the usual sterile fashion. 1% lidocaine was used for local anesthesia. Following this, a 19 gauge, 7-cm, Yueh catheter was introduced. An ultrasound image was saved for documentation purposes. The paracentesis was performed. The catheter was removed and a dressing was  applied. The patient tolerated the procedure well without immediate post procedural complication. FINDINGS: A total of approximately 1 L of clear yellow fluid was removed. IMPRESSION: Successful ultrasound-guided paracentesis yielding 1 liters of peritoneal fluid. Read by: Soyla Dryer, NP Electronically Signed   By: Corrie Mckusick D.O.   On: 05/16/2020 15:13    Lab Data:  CBC: Recent Labs  Lab 05/29/20 2247 05/30/20 0614  WBC 9.4 6.8  HGB 15.2 12.0*  HCT 42.4 34.2*  MCV 93.8 96.6  PLT 260 161   Basic Metabolic Panel: Recent Labs  Lab 05/29/20 2247 05/30/20 0614  NA 127* 126*  K 2.4* 2.6*  CL <65* 66*  CO2 42* 44*  GLUCOSE 118* 110*  BUN 19 17  CREATININE 1.01 0.81    CALCIUM 8.6* 7.4*  MG 2.4  --    GFR: Estimated Creatinine Clearance: 102 mL/min (by C-G formula based on SCr of 0.81 mg/dL). Liver Function Tests: Recent Labs  Lab 05/29/20 2247  AST 39  ALT 20  ALKPHOS 182*  BILITOT 1.8*  PROT 7.3  ALBUMIN 3.3*   Recent Labs  Lab 05/29/20 2247  LIPASE 29   No results for input(s): AMMONIA in the last 168 hours. Coagulation Profile: No results for input(s): INR, PROTIME in the last 168 hours. Cardiac Enzymes: No results for input(s): CKTOTAL, CKMB, CKMBINDEX, TROPONINI in the last 168 hours. BNP (last 3 results) No results for input(s): PROBNP in the last 8760 hours. HbA1C: No results for input(s): HGBA1C in the last 72 hours. CBG: Recent Labs  Lab 05/29/20 2245  GLUCAP 137*   Lipid Profile: No results for input(s): CHOL, HDL, LDLCALC, TRIG, CHOLHDL, LDLDIRECT in the last 72 hours. Thyroid Function Tests: No results for input(s): TSH, T4TOTAL, FREET4, T3FREE, THYROIDAB in the last 72 hours. Anemia Panel: No results for input(s): VITAMINB12, FOLATE, FERRITIN, TIBC, IRON, RETICCTPCT in the last 72 hours. Urine analysis:    Component Value Date/Time   COLORURINE YELLOW 02/11/2020 1105   APPEARANCEUR CLOUDY (A) 02/11/2020 1105   LABSPEC 1.015 02/11/2020 1105   PHURINE 6.0 02/11/2020 1105   GLUCOSEU NEGATIVE 02/11/2020 1105   HGBUR TRACE (A) 02/11/2020 1105   BILIRUBINUR NEGATIVE 02/11/2020 1105   KETONESUR NEGATIVE 02/11/2020 1105   PROTEINUR NEGATIVE 02/11/2020 1105   NITRITE NEGATIVE 02/11/2020 1105   LEUKOCYTESUR SMALL (A) 02/11/2020 1105     Toluwani Yadav M.D. [facility] 05/30/2020, 12:37 PM   Call night coverage person covering after 7pm

## 2020-05-30 NOTE — ED Notes (Signed)
Placed patient on hospital bed for comfort

## 2020-05-30 NOTE — ED Notes (Signed)
Pt placed on 2L O2 via [place] due to saturations intermittently dropping to mid 80s.

## 2020-05-30 NOTE — H&P (Signed)
History and Physical    Jeffrey Harris QBH:[id number] DOB: [birth year] DOA: 05/29/2020  PCP: [name], MD  Patient coming from: Home   Chief Complaint:  Chief Complaint  Patient presents with  . 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 Marin Olp), gout who presents to [facility] emergency department with generalized weakness nausea and vomiting.  Patient explains that for the past 4 weeks he has been experiencing frequent bouts of nausea and vomiting.  Patient describes this vomitus as nonbilious nonbloody.  Patient states that his symptoms typically come about after attempting to ingest food, typically with solids.  Patient states that he has been experiencing numerous episodes daily in excess of half a dozen times.  Patient states that in the weeks that followed his symptoms have persisted and have become associated with severe generalized weakness and lightheadedness upon rising from a seated position.  Upon further questioning patient denies fevers, diarrhea, sick contacts, recent travel or confirmed contact with COVID-19 infection.  Patient does state that he has recently been diagnosed with malignant ascites and has undergone paracentesis 3 times ever since this was diagnosed on September 3.  Patient last received Gemzar and Abraxane 9/7.   Upon evaluation in the emergency department, clinically patient is been found to be profoundly volume depleted.  Patient is also been identified to have moderate hyponatremia and severe hypokalemia.  Patient has been initiated on potassium chloride replacement as well as aggressive intravenous volume resuscitation.  Hospice group is now been called to assess the patient for admission to the hospital.  Review of Systems:   Review of Systems  Constitutional: Positive for malaise/fatigue.    Gastrointestinal: Positive for vomiting.  Neurological: Positive for weakness.  All other systems reviewed and are negative.   Past Medical History:  Diagnosis Date  . Goals of care, counseling/discussion 07/29/2019  . Gout   . Hyperbilirubinemia 07/2019  . Pancreatic cancer metastasized to liver [facility]) 07/29/2019    Past Surgical History:  Procedure Laterality Date  . BIOPSY  07/25/2019   Procedure: BIOPSY;  Surgeon: Ronnette Juniper, MD;  Location: [facility] ENDOSCOPY;  Service: Gastroenterology;;  . ESOPHAGOGASTRODUODENOSCOPY  07/25/2019   Procedure: ESOPHAGOGASTRODUODENOSCOPY (EGD);  Surgeon: Ronnette Juniper, MD;  Location: [place];  Service: Gastroenterology;;  . IR BILIARY STENT(S) EXISTING ACCESS INC DILATION CATH EXCHANGE  08/27/2019  . IR CHOLANGIOGRAM EXISTING TUBE  09/01/2019  . IR CONVERT BILIARY DRAIN TO INT EXT BILIARY DRAIN  08/27/2019  . IR IMAGING GUIDED PORT INSERTION  08/06/2019  . IR INT EXT BILIARY DRAIN WITH CHOLANGIOGRAM  07/26/2019  . IR PARACENTESIS  05/16/2020  . IR RADIOLOGIST EVAL & MGMT  08/05/2019  . IR REMOVAL BILIARY DRAIN  09/01/2019  . KNEE ARTHROSCOPY       reports that he has never smoked. He quit smokeless tobacco use about 7 years ago.  His smokeless tobacco use included snuff. He reports that he does not drink alcohol and does not use drugs.  No Known Allergies  Family History  Problem Relation Age of Onset  . Diabetes Father      Prior to Admission medications   Medication Sig Start Date End Date Taking? Authorizing Provider  diphenoxylate-atropine (LOMOTIL) 2.5-0.025 MG tablet Take 1-2 tablets by mouth 4 (four) times daily as needed for diarrhea or loose stools. 03/09/20  Yes Volanda Napoleon, MD  gabapentin (  NEURONTIN) 300 MG capsule Take 1 capsule (300 mg total) by mouth 3 (three) times daily. 05/10/20  Yes Ennever, Rudell Cobb, MD  hydrocortisone (ANUSOL-HC) 25 MG suppository PLACE 1 SUPPOSITORY (25 MG TOTAL) RECTALLY 2 (TWO) TIMES DAILY. FOR 7 DAYS  12/16/19  Yes Volanda Napoleon, MD  KLOR-CON M20 20 MEQ tablet TAKE 2 TABLETS BY MOUTH DAILY 04/23/20  Yes Volanda Napoleon, MD  lactulose [facility]) 10 GM/15ML solution SMARTSIG:40 Milliliter(s) By Mouth Morning-Evening 05/10/20  Yes Ennever, Rudell Cobb, MD  lipase/protease/amylase (CREON) 36000 UNITS CPEP capsule Take 2 capsules (72,000 Units total) by mouth 3 (three) times daily before meals. 09/14/19  Yes Ennever, Rudell Cobb, MD  ondansetron (ZOFRAN) 8 MG tablet Take 1 tablet (8 mg total) by mouth every 8 (eight) hours as needed for nausea or vomiting. 05/17/20  Yes Ennever, Rudell Cobb, MD  oxyCODONE-acetaminophen (PERCOCET/ROXICET) 5-325 MG tablet Take 1 tablet by mouth every 6 (six) hours as needed for severe pain.   Yes [provider]  pantoprazole (PROTONIX) 40 MG tablet TAKE 1 TABLET BY MOUTH TWICE A DAY 01/15/20  Yes Ennever, Rudell Cobb, MD  tamsulosin (FLOMAX) 0.4 MG CAPS capsule Take 0.4 mg by mouth daily. 12/24/19  Yes [provider]  zolpidem (AMBIEN) 5 MG tablet Take 1 tablet (5 mg total) by mouth at bedtime as needed for sleep. 08/17/19  Yes Volanda Napoleon, MD  allopurinol (ZYLOPRIM) 300 MG tablet Take 300 mg by mouth daily. Patient not taking: Reported on 05/30/2020 03/28/20   [provider]  linaclotide Rolan Lipa) 145 MCG CAPS capsule Take 1 capsule (145 mcg total) by mouth daily before breakfast. 12/22/19   Volanda Napoleon, MD  prochlorperazine (COMPAZINE) 10 MG tablet Take 1 tablet (10 mg total) by mouth every 6 (six) hours as needed (Nausea or vomiting). 08/07/19 04/13/20  Volanda Napoleon, MD    Physical Exam: Vitals:   05/30/20 0328 05/30/20 0330 05/30/20 0430 05/30/20 0615  BP: 96/71 96/71 91/67  92/71  Pulse: 86 65 81 77  Resp: 13 14 19 11   Temp:      TempSrc:      SpO2: 99% 99% 100% 99%    Constitutional: Lethargic but arousable, oriented x3, cachectic. Skin: no rashes, no lesions, increased skin pallor noted.  Poor skin turgor noted.  Eyes: Pupils are  equally reactive to light.  Notable increased conjunctival pallor without scleral icterus. ENMT: Extremely dry mucous membranes noted.  Posterior pharynx clear of any exudate or lesions.   Neck: normal, supple, no masses, no thyromegaly.  No evidence of jugular venous distension.   Respiratory: clear to auscultation bilaterally, no wheezing, no crackles. Normal respiratory effort. No accessory muscle use.  Cardiovascular: Regular rate and rhythm, no murmurs / rubs / gallops. No extremity edema. 2+ pedal pulses. No carotid bruits.  Chest:   Nontender without crepitus or deformity.   Back:   Nontender without crepitus or deformity. Abdomen: Abdomen is soft and nontender.  No evidence of intra-abdominal masses.  Positive bowel sounds noted in all quadrants.   Musculoskeletal: No joint deformity upper and lower extremities. Good ROM, no contractures. Normal muscle tone.  Neurologic: CN 2-12 grossly intact. Sensation intact.  Patient moving all 4 extremities spontaneously.  Patient is following all commands.  Patient is responsive to verbal stimuli.   Psychiatric: Patient exhibits normal mood with appropriate affect.  Patient seems to possess insight as to their current situation.     Labs on Admission: I have personally reviewed following labs  and imaging studies -   CBC: Recent Labs  Lab 05/29/20 2247  WBC 9.4  HGB 15.2  HCT 42.4  MCV 93.8  PLT 846   Basic Metabolic Panel: Recent Labs  Lab 05/29/20 2247  NA 127*  K 2.4*  CL <65*  CO2 42*  GLUCOSE 118*  BUN 19  CREATININE 1.01  CALCIUM 8.6*  MG 2.4   GFR: Estimated Creatinine Clearance: 81.8 mL/min (by C-G formula based on SCr of 1.01 mg/dL). Liver Function Tests: Recent Labs  Lab 05/29/20 2247  AST 39  ALT 20  ALKPHOS 182*  BILITOT 1.8*  PROT 7.3  ALBUMIN 3.3*   Recent Labs  Lab 05/29/20 2247  LIPASE 29   No results for input(s): AMMONIA in the last 168 hours. Coagulation Profile: No results for input(s): INR,  PROTIME in the last 168 hours. Cardiac Enzymes: No results for input(s): CKTOTAL, CKMB, CKMBINDEX, TROPONINI in the last 168 hours. BNP (last 3 results) No results for input(s): PROBNP in the last 8760 hours. HbA1C: No results for input(s): HGBA1C in the last 72 hours. CBG: Recent Labs  Lab 05/29/20 2245  GLUCAP 137*   Lipid Profile: No results for input(s): CHOL, HDL, LDLCALC, TRIG, CHOLHDL, LDLDIRECT in the last 72 hours. Thyroid Function Tests: No results for input(s): TSH, T4TOTAL, FREET4, T3FREE, THYROIDAB in the last 72 hours. Anemia Panel: No results for input(s): VITAMINB12, FOLATE, FERRITIN, TIBC, IRON, RETICCTPCT in the last 72 hours. Urine analysis:    Component Value Date/Time   COLORURINE YELLOW 02/11/2020 1105   APPEARANCEUR CLOUDY (A) 02/11/2020 1105   LABSPEC 1.015 02/11/2020 1105   PHURINE 6.0 02/11/2020 1105   GLUCOSEU NEGATIVE 02/11/2020 1105   HGBUR TRACE (A) 02/11/2020 1105   BILIRUBINUR NEGATIVE 02/11/2020 1105   KETONESUR NEGATIVE 02/11/2020 1105   PROTEINUR NEGATIVE 02/11/2020 1105   NITRITE NEGATIVE 02/11/2020 1105   LEUKOCYTESUR SMALL (A) 02/11/2020 1105    Radiological Exams on Admission - Personally Reviewed: DG ABD ACUTE 2+V W 1V CHEST  Result Date: 05/30/2020 CLINICAL DATA:  Nausea, metastatic pancreatic carcinoma EXAM: ACUTE ABDOMEN SERIES (2 VIEW ABDOMEN AND 1 VIEW CHEST) COMPARISON:  Radiograph 05/17/2020, CT 05/03/2020 FINDINGS: Right IJ approach Port-A-Cath tip terminates at the superior cavoatrial junction. Telemetry leads overlie the chest. Few streaky likely atelectatic opacities in the lung bases. No consolidation, features of edema, pneumothorax, or effusion. Pulmonary vascularity is normally distributed. The aorta is calcified. The remaining cardiomediastinal contours are unremarkable. A biliary stent is seen in the upper abdomen. No high-grade obstructive bowel gas pattern. Moderate colonic stool burden. No subdiaphragmatic free air. No  suspicious calcifications. Increased attenuation of the abdomen may reflect a degree of ascites. No acute or suspicious osseous lesions. Degenerative changes in the spine, shoulders, hips and pelvis. IMPRESSION: 1. Atelectatic changes in the lungs. 2. No high-grade obstructive bowel gas pattern.  No free air. 3. Moderate colonic stool burden. 4. Possible ascites. 5.  Aortic Atherosclerosis (ICD10-I70.0). Electronically Signed   By: Lovena Le M.D.   On: 05/30/2020 00:33    EKG: Personally reviewed.  Rhythm is sinus tachycardia with heart rate of 108 bpm.  Notable Q waves in the anterior septal leads and inferior leads.  Notable prolonged corrected QT interval of 517. No dynamic ST segment changes appreciated.  Assessment/Plan Active Problems:   Intractable nausea and vomiting   Patient presenting with nearly 4-week history of frequent bouts of nausea and vomiting This is led to extreme difficulty in the patient receiving any nourishment and  tolerating anything but liquids over the span of time.  Patient presents with profound volume depletion and electrolyte abnormalities  Initiating intravenous volume resuscitation, potassium chloride replacement  Due to prolonged QT interval, providing patient with as needed Compazine as opposed to Zofran which has less (but not absent) QT prolonging properties  Abdomen is benign on examination  I am uncertain as to whether or not patient would still be suffering from chemotherapy-induced nausea and vomiting this far after his last dose.  Patient last received chemotherapy on 9/7 as best as I can tell from documentation.  This may warrant a phone call to Dr. Marin Olp in the morning to inquire about this.  Alternatively, patient's frequent nausea and vomiting may have something to do with recent diagnosis of malignant ascites although patient does not seem to have substantial ascites on examination.  Finally, a less likely possibility is that patient is  suffering from a gastric outlet obstruction and a GI consultation could be considered for EGD at the daytime provider's discretion    Dehydration with hyponatremia   Please see assessment and plan above  Monitoring sodium levels with serial chemistries    Pancreatic cancer metastasized to liver [facility])   Patient follows with Dr. Marin Olp  Pancreatic adenocarcinoma diagnosed in November 2020  Last round of chemotherapy 9/7 with Abraxane and Gemzar as best as I can tell from documentation  Most recent complication is development of malignant ascites diagnosed on 9/3    Hypokalemia due to excessive gastrointestinal loss of potassium   Please see assessment and plan above    Malignant ascites   Recent diagnosis on 9/3  Patient has undergone paracentesis 3 times since diagnosis   no clinical evidence of significant ascites on examination.      Prolonged QT interval  Prolonged QTC of 517 on admission EKG  Monitoring patient on telemetry  Abstaining from Zofran as the as needed antiemetic and using Compazine instead for now.    Code Status:  Full code Family Communication: deferred   Status is: Observation  The patient remains OBS appropriate and will d/c before 2 midnights.  Dispo: The patient is from: Home              Anticipated d/c is to: Home              Anticipated d/c date is: 2 days              Patient currently is not medically stable to d/c.        Vernelle Emerald MD [facility] Pager [phone]  If 7PM-7AM, please contact night-coverage [email or web address] Use universal [place] password for that web site. If you do not have the password, please call the hospital operator.  05/30/2020, 6:58 AM

## 2020-05-31 ENCOUNTER — Inpatient Hospital Stay (HOSPITAL_COMMUNITY): Payer: BC Managed Care – PPO

## 2020-05-31 DIAGNOSIS — R112 Nausea with vomiting, unspecified: Secondary | ICD-10-CM | POA: Diagnosis not present

## 2020-05-31 DIAGNOSIS — E873 Alkalosis: Secondary | ICD-10-CM | POA: Diagnosis not present

## 2020-05-31 DIAGNOSIS — E860 Dehydration: Secondary | ICD-10-CM | POA: Diagnosis not present

## 2020-05-31 DIAGNOSIS — E876 Hypokalemia: Secondary | ICD-10-CM | POA: Diagnosis not present

## 2020-05-31 LAB — CBC WITH DIFFERENTIAL/PLATELET
Abs Immature Granulocytes: 0.04 10*3/uL (ref 0.00–0.07)
Basophils Absolute: 0 10*3/uL (ref 0.0–0.1)
Basophils Relative: 0 %
Eosinophils Absolute: 0 10*3/uL (ref 0.0–0.5)
Eosinophils Relative: 0 %
HCT: 35.7 % — ABNORMAL LOW (ref 39.0–52.0)
Hemoglobin: 12.2 g/dL — ABNORMAL LOW (ref 13.0–17.0)
Immature Granulocytes: 1 %
Lymphocytes Relative: 7 %
Lymphs Abs: 0.5 10*3/uL — ABNORMAL LOW (ref 0.7–4.0)
MCH: 33.5 pg (ref 26.0–34.0)
MCHC: 34.2 g/dL (ref 30.0–36.0)
MCV: 98.1 fL (ref 80.0–100.0)
Monocytes Absolute: 0.7 10*3/uL (ref 0.1–1.0)
Monocytes Relative: 10 %
Neutro Abs: 5.8 10*3/uL (ref 1.7–7.7)
Neutrophils Relative %: 82 %
Platelets: 142 10*3/uL — ABNORMAL LOW (ref 150–400)
RBC: 3.64 MIL/uL — ABNORMAL LOW (ref 4.22–5.81)
RDW: 18.6 % — ABNORMAL HIGH (ref 11.5–15.5)
WBC: 7.2 10*3/uL (ref 4.0–10.5)
nRBC: 0 % (ref 0.0–0.2)

## 2020-05-31 LAB — COMPREHENSIVE METABOLIC PANEL
ALT: 20 U/L (ref 0–44)
AST: 34 U/L (ref 15–41)
Albumin: 2.7 g/dL — ABNORMAL LOW (ref 3.5–5.0)
Alkaline Phosphatase: 168 U/L — ABNORMAL HIGH (ref 38–126)
Anion gap: 11 (ref 5–15)
BUN: 12 mg/dL (ref 8–23)
CO2: 34 mmol/L — ABNORMAL HIGH (ref 22–32)
Calcium: 7.3 mg/dL — ABNORMAL LOW (ref 8.9–10.3)
Chloride: 80 mmol/L — ABNORMAL LOW (ref 98–111)
Creatinine, Ser: 0.59 mg/dL — ABNORMAL LOW (ref 0.61–1.24)
GFR calc Af Amer: >60 mL/min (ref >60)
GFR calc non Af Amer: >60 mL/min (ref >60)
Glucose, Bld: 95 mg/dL (ref 70–99)
Potassium: 3.2 mmol/L — ABNORMAL LOW (ref 3.5–5.1)
Sodium: 125 mmol/L — ABNORMAL LOW (ref 135–145)
Total Bilirubin: 1.3 mg/dL — ABNORMAL HIGH (ref 0.3–1.2)
Total Protein: 5.6 g/dL — ABNORMAL LOW (ref 6.5–8.1)

## 2020-05-31 LAB — MAGNESIUM: Magnesium: 2.3 mg/dL (ref 1.7–2.4)

## 2020-05-31 MED ORDER — SODIUM CHLORIDE 0.9% FLUSH
10.0000 mL | Freq: Two times a day (BID) | INTRAVENOUS | Status: DC
  Administered 2020-06-01 – 2020-06-08 (×13): 10 mL
  Administered 2020-06-09: 20 mL
  Administered 2020-06-09: 10 mL

## 2020-05-31 MED ORDER — OLANZAPINE 10 MG PO TABS
10.0000 mg | ORAL_TABLET | Freq: Every day | ORAL | Status: DC
  Administered 2020-05-31: 10 mg via ORAL
  Filled 2020-05-31: qty 2
  Filled 2020-05-31: qty 1

## 2020-05-31 MED ORDER — METOCLOPRAMIDE HCL 5 MG/ML IJ SOLN
20.0000 mg | Freq: Four times a day (QID) | INTRAVENOUS | Status: DC
  Administered 2020-05-31 – 2020-06-03 (×12): 20 mg via INTRAVENOUS
  Filled 2020-05-31 (×16): qty 4

## 2020-05-31 MED ORDER — ALBUMIN HUMAN 25 % IV SOLN
25.0000 g | Freq: Three times a day (TID) | INTRAVENOUS | Status: AC
  Administered 2020-05-31 (×2): 25 g via INTRAVENOUS
  Filled 2020-05-31 (×2): qty 100

## 2020-05-31 MED ORDER — SODIUM CHLORIDE 0.9 % IV SOLN
INTRAVENOUS | Status: DC
  Administered 2020-06-01: 1000 mL via INTRAVENOUS

## 2020-05-31 MED ORDER — POTASSIUM CHLORIDE CRYS ER 20 MEQ PO TBCR
40.0000 meq | EXTENDED_RELEASE_TABLET | Freq: Once | ORAL | Status: AC
  Administered 2020-05-31: 40 meq via ORAL
  Filled 2020-05-31: qty 2

## 2020-05-31 MED ORDER — ALUM & MAG HYDROXIDE-SIMETH 200-200-20 MG/5ML PO SUSP
30.0000 mL | ORAL | Status: DC | PRN
  Administered 2020-05-31: 30 mL via ORAL
  Filled 2020-05-31: qty 30

## 2020-05-31 MED ORDER — SODIUM CHLORIDE 0.9% FLUSH
10.0000 mL | INTRAVENOUS | Status: DC | PRN
  Administered 2020-06-01 – 2020-06-10 (×2): 10 mL

## 2020-05-31 MED ORDER — IOHEXOL 300 MG/ML  SOLN
150.0000 mL | Freq: Once | INTRAMUSCULAR | Status: AC | PRN
  Administered 2020-05-31: 120 mL via ORAL

## 2020-05-31 NOTE — H&P (View-Only) (Signed)
Patient sees Jeffrey Harris, physician assistant at [facility].  Subjective: Has not had any nausea or vomiting since he was at triage yesterday. Reports having a pasty bowel movement yesterday and 1 bowel movement today. Denies abdominal pain.  Objective: Vital signs in last 24 hours: Temp:  [97.7 F (36.5 C)-98 F (36.7 C)] 97.9 F (36.6 C) (09/28 0423) Pulse Rate:  [63-76] 74 (09/28 0423) Resp:  [15-23] 20 (09/28 0423) BP: (81-111)/(61-78) 92/67 (09/28 0423) SpO2:  [73 %-100 %] 90 % (09/28 0423) Weight:  [78 kg] 78 kg (09/28 0232) Weight change:  Last BM Date: 05/30/20  PE: No obvious pallor or icterus GENERAL: Moist mucous membranes, no signs of dehydration ABDOMEN: Soft, nondistended, nontender, normoactive bowel sounds EXTREMITIES: No deformity  Lab Results: Results for orders placed or performed during the hospital encounter of 05/29/20 (from the past 48 hour(s))  CBG monitoring, ED     Status: Abnormal   Collection Time: 05/29/20 10:45 PM  Result Value Ref Range   Glucose-Capillary 137 (H) 70 - 99 mg/dL    Comment: Glucose reference range applies only to samples taken after fasting for at least 8 hours.  Lipase, blood     Status: None   Collection Time: 05/29/20 10:47 PM  Result Value Ref Range   Lipase 29 11 - 51 U/L    Comment: Performed at [facility], [place] [address] [place], [place] [address]  Comprehensive metabolic panel     Status: Abnormal   Collection Time: 05/29/20 10:47 PM  Result Value Ref Range   Sodium 127 (L) 135 - 145 mmol/L   Potassium 2.4 (LL) 3.5 - 5.1 mmol/L    Comment: CRITICAL RESULT CALLED TO, READ BACK BY AND VERIFIED WITH: A,HODGES AT 2334 ON 05/29/20 BY A,MOHAMED    Chloride <65 (LL) 98 - 111 mmol/L    Comment: CRITICAL RESULT CALLED TO, READ BACK BY AND VERIFIED WITH: A,HODGES AT 2334 ON 05/29/20 BY A,MOHAMED    CO2 42 (H) 22 - 32 mmol/L   Glucose, Bld 118 (H) 70 - 99 mg/dL    Comment: Glucose reference range  applies only to samples taken after fasting for at least 8 hours.   BUN 19 8 - 23 mg/dL   Creatinine, Ser 1.01 0.61 - 1.24 mg/dL   Calcium 8.6 (L) 8.9 - 10.3 mg/dL   Total Protein 7.3 6.5 - 8.1 g/dL   Albumin 3.3 (L) 3.5 - 5.0 g/dL   AST 39 15 - 41 U/L   ALT 20 0 - 44 U/L   Alkaline Phosphatase 182 (H) 38 - 126 U/L   Total Bilirubin 1.8 (H) 0.3 - 1.2 mg/dL   GFR calc non Af Amer >60 >60 mL/min   GFR calc Af Amer >60 >60 mL/min   Anion gap NOT CALCULATED 5 - 15    Comment: Performed at [facility], [place] [address] [place], [place] [address]  CBC     Status: Abnormal   Collection Time: 05/29/20 10:47 PM  Result Value Ref Range   WBC 9.4 4.0 - 10.5 K/uL   RBC 4.52 4.22 - 5.81 MIL/uL   Hemoglobin 15.2 13.0 - 17.0 g/dL   HCT 42.4 39 - 52 %   MCV 93.8 80.0 - 100.0 fL   MCH 33.6 26.0 - 34.0 pg   MCHC 35.8 30.0 - 36.0 g/dL   RDW 18.8 (H) 11.5 - 15.5 %   Platelets 260 150 - 400 K/uL   nRBC 0.0 0.0 -  0.2 %    Comment: Performed at [facility], [place] [address] [place], [place] [address]  Magnesium     Status: None   Collection Time: 05/29/20 10:47 PM  Result Value Ref Range   Magnesium 2.4 1.7 - 2.4 mg/dL    Comment: Performed at [facility], [place] [address], [place], [place] [address]  Respiratory Panel by RT PCR (Flu A&B, Covid) - Nasopharyngeal Swab     Status: None   Collection Time: 05/30/20  1:40 AM   Specimen: Nasopharyngeal Swab  Result Value Ref Range   SARS Coronavirus 2 by RT PCR NEGATIVE NEGATIVE    Comment: (NOTE) SARS-CoV-2 target nucleic acids are NOT DETECTED.  The SARS-CoV-2 RNA is generally detectable in upper respiratoy specimens during the acute phase of infection. The lowest concentration of SARS-CoV-2 viral copies this assay can detect is 131 copies/mL. A negative result does not preclude SARS-Cov-2 infection and should not be used as the sole basis for treatment or other patient management decisions.  A negative result may occur with  improper specimen collection/handling, submission of specimen other than nasopharyngeal swab, presence of viral mutation(s) within the areas targeted by this assay, and inadequate number of viral copies (<131 copies/mL). A negative result must be combined with clinical observations, patient history, and epidemiological information. The expected result is Negative.  Fact Sheet for Patients:  PinkCheek.be  Fact Sheet for Healthcare Providers:  GravelBags.it  This test is no t yet approved or cleared by the [place] FDA and  has been authorized for detection and/or diagnosis of SARS-CoV-2 by FDA under an Emergency Use Authorization (EUA). This EUA will remain  in effect (meaning this test can be used) for the duration of the COVID-19 declaration under Section 564(b)(1) of the Act, 21 U.S.C. section 360bbb-3(b)(1), unless the authorization is terminated or revoked sooner.     Influenza A by PCR NEGATIVE NEGATIVE   Influenza B by PCR NEGATIVE NEGATIVE    Comment: (NOTE) The Xpert Xpress SARS-CoV-2/FLU/RSV assay is intended as an aid in  the diagnosis of influenza from Nasopharyngeal swab specimens and  should not be used as a sole basis for treatment. Nasal washings and  aspirates are unacceptable for Xpert Xpress SARS-CoV-2/FLU/RSV  testing.  Fact Sheet for Patients: PinkCheek.be  Fact Sheet for Healthcare Providers: GravelBags.it  This test is not yet approved or cleared by the [place] FDA and  has been authorized for detection and/or diagnosis of SARS-CoV-2 by  FDA under an Emergency Use Authorization (EUA). This EUA will remain  in effect (meaning this test can be used) for the duration of the  Covid-19 declaration under Section 564(b)(1) of the Act, 21  U.S.C. section 360bbb-3(b)(1), unless the authorization is   terminated or revoked. Performed at [facility], [place] [address], [place], [place] [address]   Basic metabolic panel     Status: Abnormal   Collection Time: 05/30/20  6:14 AM  Result Value Ref Range   Sodium 126 (L) 135 - 145 mmol/L   Potassium 2.6 (LL) 3.5 - 5.1 mmol/L    Comment: CRITICAL RESULT CALLED TO, READ BACK BY AND VERIFIED WITH: DOWD,P @ 0750 ON [id number] BY POTEAT,S    Chloride 66 (L) 98 - 111 mmol/L   CO2 44 (H) 22 - 32 mmol/L   Glucose, Bld 110 (H) 70 - 99 mg/dL    Comment: Glucose reference range applies only to samples taken after fasting for at least 8 hours.  BUN 17 8 - 23 mg/dL   Creatinine, Ser 0.81 0.61 - 1.24 mg/dL   Calcium 7.4 (L) 8.9 - 10.3 mg/dL   GFR calc non Af Amer >60 >60 mL/min   GFR calc Af Amer >60 >60 mL/min   Anion gap 16 (H) 5 - 15    Comment: Performed at [facility], [place] [address], [place], [place] [address]  CBC     Status: Abnormal   Collection Time: 05/30/20  6:14 AM  Result Value Ref Range   WBC 6.8 4.0 - 10.5 K/uL   RBC 3.54 (L) 4.22 - 5.81 MIL/uL   Hemoglobin 12.0 (L) 13.0 - 17.0 g/dL   HCT 34.2 (L) 39 - 52 %   MCV 96.6 80.0 - 100.0 fL   MCH 33.9 26.0 - 34.0 pg   MCHC 35.1 30.0 - 36.0 g/dL   RDW 18.9 (H) 11.5 - 15.5 %   Platelets 155 150 - 400 K/uL   nRBC 0.0 0.0 - 0.2 %    Comment: Performed at [facility], [place] [address], [place], [place] [address]  Cortisol     Status: None   Collection Time: 05/30/20 11:47 AM  Result Value Ref Range   Cortisol, Plasma 17.0 ug/dL    Comment: (NOTE) AM    6.7 - 22.6 ug/dL PM   <10.0       ug/dL Performed at [place] [address] [place], [place] [address]   Basic metabolic panel     Status: Abnormal   Collection Time: 05/30/20 11:47 AM  Result Value Ref Range   Sodium 126 (L) 135 - 145 mmol/L   Potassium 3.1 (L) 3.5 - 5.1 mmol/L   Chloride 72 (L) 98 - 111 mmol/L   CO2 40 (H) 22 - 32 mmol/L   Glucose, Bld 89 70 -  99 mg/dL    Comment: Glucose reference range applies only to samples taken after fasting for at least 8 hours.   BUN 17 8 - 23 mg/dL   Creatinine, Ser 0.76 0.61 - 1.24 mg/dL   Calcium 7.2 (L) 8.9 - 10.3 mg/dL   GFR calc non Af Amer >60 >60 mL/min   GFR calc Af Amer >60 >60 mL/min   Anion gap 14 5 - 15    Comment: Performed at [facility], [place] [address], [place], [place] [address]  Osmolality     Status: Abnormal   Collection Time: 05/30/20 11:47 AM  Result Value Ref Range   Osmolality 264 (L) 275 - 295 mOsm/kg    Comment: Performed at [facility], [place] [address], [place], [place] [address]  Urinalysis, Routine w reflex microscopic     Status: Abnormal   Collection Time: 05/30/20 11:56 AM  Result Value Ref Range   Color, Urine YELLOW YELLOW   APPearance CLEAR CLEAR   Specific Gravity, Urine 1.021 1.005 - 1.030   pH 8.0 5.0 - 8.0   Glucose, UA NEGATIVE NEGATIVE mg/dL   Hgb urine dipstick NEGATIVE NEGATIVE   Bilirubin Urine SMALL (A) NEGATIVE   Ketones, ur 80 (A) NEGATIVE mg/dL   Protein, ur 30 (A) NEGATIVE mg/dL   Nitrite NEGATIVE NEGATIVE   Leukocytes,Ua NEGATIVE NEGATIVE   RBC / HPF 0-5 0 - 5 RBC/hpf   WBC, UA 0-5 0 - 5 WBC/hpf   Bacteria, UA RARE (A) NONE SEEN   Mucus PRESENT    Hyaline Casts, UA PRESENT     Comment: Performed  at [facility], [place] [address], [place], [place] [address]  Osmolality, urine     Status: None   Collection Time: 05/30/20 11:56 AM  Result Value Ref Range   Osmolality, Ur 682 300 - 900 mOsm/kg    Comment: Performed at [place] [address], [place], [place] [address]  Sodium, urine, random     Status: None   Collection Time: 05/30/20 11:56 AM  Result Value Ref Range   Sodium, Ur 144 mmol/L    Comment: Performed at [facility], [place] [address], [place], [place] [address]  Lactic acid, plasma     Status: None   Collection Time: 05/30/20 12:48 PM  Result Value Ref Range    Lactic Acid, Venous 0.9 0.5 - 1.9 mmol/L    Comment: Performed at [facility], [place] [address], [place], [place] [address]    Studies/Results: CT ABDOMEN PELVIS WO CONTRAST  Result Date: 05/30/2020 CLINICAL DATA:  Nausea and vomiting. History of pancreatic cancer. EXAM: CT ABDOMEN AND PELVIS WITHOUT CONTRAST TECHNIQUE: Multidetector CT imaging of the abdomen and pelvis was performed following the standard protocol without IV contrast. COMPARISON:  May 03, 2020 FINDINGS: Lower chest: Mild atelectasis and/or infiltrate is seen along the inferior aspect of the left upper lobe. Hepatobiliary: There is a mild amount of perihepatic fluid. This is decreased in severity when compared to the prior exam. A few tiny foci of air are seen within the right lobe of the liver (axial CT images 16 through 19, CT series number 2). This is decreased in severity when compared to the prior study. A tiny focus of air is also seen within the lumen of a mildly distended gallbladder. This is also decreased in severity. No gallstones, gallbladder wall thickening, or biliary dilatation. Pancreas: A common bile duct stent is seen within the head of the pancreas. Persistent enlargement of the pancreatic head is seen. It should be noted that this area, and surrounding lymph nodes, are limited in evaluation in the absence of intravenous contrast. Spleen: A mild amount of perisplenic fluid is seen. Spleen is otherwise normal in appearance. Adrenals/Urinary Tract: The right adrenal gland is normal in appearance. Stable 2.1 cm x 1.5 cm and 1.3 cm x 1.0 cm low-attenuation left adrenal masses are seen. 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 Kidneys are normal in size, without renal calculi, focal lesion, or hydronephrosis. The urinary bladder is partially contracted and otherwise normal in appearance. Stomach/Bowel: Stomach is within normal limits. Appendix appears normal. No evidence of bowel wall thickening, distention, or  inflammatory changes. Vascular/Lymphatic: There is moderate severity calcification of the abdominal aorta and bilateral common iliac arteries, without evidence of aneurysmal dilatation. No enlarged abdominal or pelvic lymph nodes. Reproductive: Prostate is unremarkable. Other: No abdominal wall hernia or abnormality. Mild diffuse nonspecific mesenteric inflammatory fat stranding is seen throughout the abdomen. A mild to moderate amount of pelvic free fluid is seen. This is stable in appearance. Musculoskeletal: No acute or significant osseous findings. IMPRESSION: 1. Persistent enlargement of the pancreatic head with a distal common bile duct stent in place. 2. Interval decrease in severity of tiny foci of air within the right lobe of the liver and gallbladder. 3. Mild amount of perihepatic and perisplenic fluid, decreased in severity when compared to the prior exam. 4. Stable low-attenuation left adrenal masses which may represent adrenal adenomas. 5. Mild to moderate amount of pelvic free fluid, stable. 6. Mild left upper lobe atelectasis and/or  infiltrate. 7. Diffuse mesenteric inflammatory fat stranding secondary to diffuse mesenteritis cannot be excluded. 8. Aortic atherosclerosis. Aortic Atherosclerosis (ICD10-I70.0). Electronically Signed   By: Virgina Norfolk M.D.   On: 05/30/2020 16:07   DG CHEST PORT 1 VIEW  Result Date: 05/30/2020 CLINICAL DATA:  Hypoxia. EXAM: PORTABLE CHEST 1 VIEW COMPARISON:  Prior chest radiographs 05/30/2020. Chest CT 05/03/2020. FINDINGS: Right chest infusion port catheter with tip projecting in the region of the superior cavoatrial junction. Heart size within normal limits. Mild ill-defined/linear opacities within the left lung base are more pronounced as compared to the chest radiographs performed earlier the same day and favored to reflect atelectasis. The right lung is clear. No evidence of pleural effusion or pneumothorax. Redemonstrated sclerosis within the anterior left  fourth rib which is likely posttraumatic in etiology. IMPRESSION: Mild ill-defined/linear opacities within the left lung base are more pronounced as compared to the chest radiographs performed earlier the same day and favored to reflect atelectasis. Redemonstrated sclerosis within the anterior left fourth rib which is likely posttraumatic in etiology. Continued attention recommended on follow-up. Electronically Signed   By: Kellie Simmering DO   On: 05/30/2020 08:20   DG ABD ACUTE 2+V W 1V CHEST  Result Date: 05/30/2020 CLINICAL DATA:  Nausea, metastatic pancreatic carcinoma EXAM: ACUTE ABDOMEN SERIES (2 VIEW ABDOMEN AND 1 VIEW CHEST) COMPARISON:  Radiograph 05/17/2020, CT 05/03/2020 FINDINGS: Right IJ approach Port-A-Cath tip terminates at the superior cavoatrial junction. Telemetry leads overlie the chest. Few streaky likely atelectatic opacities in the lung bases. No consolidation, features of edema, pneumothorax, or effusion. Pulmonary vascularity is normally distributed. The aorta is calcified. The remaining cardiomediastinal contours are unremarkable. A biliary stent is seen in the upper abdomen. No high-grade obstructive bowel gas pattern. Moderate colonic stool burden. No subdiaphragmatic free air. No suspicious calcifications. Increased attenuation of the abdomen may reflect a degree of ascites. No acute or suspicious osseous lesions. Degenerative changes in the spine, shoulders, hips and pelvis. IMPRESSION: 1. Atelectatic changes in the lungs. 2. No high-grade obstructive bowel gas pattern.  No free air. 3. Moderate colonic stool burden. 4. Possible ascites. 5.  Aortic Atherosclerosis (ICD10-I70.0). Electronically Signed   By: Lovena Le M.D.   On: 05/30/2020 00:33    Medications: I have reviewed the patient's current medications.  Assessment: Metastatic pancreatic adenocarcinoma Malignant ascites Last dose of chemotherapy on 05/10/2020 with plans to change chemotherapy on 06/01/2020 CT shows  persistent pancreatic head enlargement and CBD stent in place decreased fluid around liver and spleen and mild to moderate fluid in pelvic area with diffuse mesenteric inflammatory fat stranding secondary to diffuse mesenteritis Abdominal x-ray showed no high-grade obstructive bowel gas pattern with moderate colonic stool burden  Hypokalemia, hyponatremia with normal renal function and alkalosis, bicarb 40  Plan: Supportive management, IV fluid resuscitation, electrolyte replacement Tolerating clear liquid diet, advance as tolerated I would discontinue IV Flagyl without clear indication of infection as it may worsen nausea Agree with PPI twice daily and Compazine 10 mg every 6 hours as needed Patient is also receiving Reglan and states that Zofran did not help as an outpatient  Ronnette Juniper, MD 05/31/2020, 8:37 AM

## 2020-05-31 NOTE — Evaluation (Signed)
Physical Therapy Evaluation Patient Details Name: Jeffrey Harris MRN: [id number] DOB: 05/16/1956 Today's Date: 05/31/2020   History of Present Illness  64 year old male with past medical history of adenocarcinoma of the pancreas with metastasis to the liver complicated by malignant ascites (follows with Dr. Marin Olp), gout who presents to [facility] emergency department with generalized weakness nausea and vomiting.  Clinical Impression  Pt admitted with above diagnosis.  Pt agreeable to OOB, amb to bathroom. Min/guard overall. Declined further activity d/t generalized fatigue and lack of sleep. Will continue to follow in acute setting, anticipate steady progress and doubt he will need f/u.  Pt currently with functional limitations due to the deficits listed below (see PT Problem List). Pt will benefit from skilled PT to increase their independence and safety with mobility to allow discharge to the venue listed below.      Follow Up Recommendations No PT follow up    Equipment Recommendations  None recommended by PT    Recommendations for Other Services       Precautions / Restrictions Precautions Precautions: Fall Restrictions Weight Bearing Restrictions: No      Mobility  Bed Mobility Overal bed mobility: Needs Assistance Bed Mobility: Supine to Sit     Supine to sit: Supervision     General bed mobility comments: for lines and safety  Transfers Overall transfer level: Needs assistance Equipment used: None Transfers: Sit to/from Stand Sit to Stand: Min guard         General transfer comment: cues for overall safety, BP soft however pt denies dizziness  Ambulation/Gait Ambulation/Gait assistance: Min guard Gait Distance (Feet): 20 Feet Assistive device: IV Pole Gait Pattern/deviations: Step-through pattern;Decreased stride length     General Gait Details: mildly unsteady however without overt LOB, min/guard for safety and management of IV pole in  tighter spaces  Stairs            Wheelchair Mobility    Modified Rankin (Stroke Patients Only)       Balance Overall balance assessment: Mild deficits observed, not formally tested                                           Pertinent Vitals/Pain Pain Assessment: No/denies pain    Home Living Family/patient expects to be discharged to:: Private residence Living Arrangements:  (lives with wife who works full time) Available Help at Discharge: Family Type of Home: House       Home Layout: One level Home Equipment: None      Prior Function Level of Independence: Independent               Hand Dominance        Extremity/Trunk Assessment   Upper Extremity Assessment Upper Extremity Assessment: Overall WFL for tasks assessed    Lower Extremity Assessment Lower Extremity Assessment: Overall WFL for tasks assessed       Communication   Communication: No difficulties  Cognition Arousal/Alertness: Awake/alert Behavior During Therapy: WFL for tasks assessed/performed Overall Cognitive Status: Within Functional Limits for tasks assessed                                        General Comments      Exercises     Assessment/Plan    PT Assessment  Patient needs continued PT services  PT Problem List Decreased strength;Decreased mobility;Decreased activity tolerance       PT Treatment Interventions Therapeutic exercise;Gait training;Functional mobility training;Therapeutic activities;Patient/family education    PT Goals (Current goals can be found in the Care Plan section)  Acute Rehab PT Goals Patient Stated Goal: feel better PT Goal Formulation: With patient Time For Goal Achievement: 06/13/20 Potential to Achieve Goals: Good    Frequency Min 3X/week   Barriers to discharge        Co-evaluation               AM-PAC PT "6 Clicks" Mobility  Outcome Measure Help needed turning from your back to  your side while in a flat bed without using bedrails?: None Help needed moving from lying on your back to sitting on the side of a flat bed without using bedrails?: None Help needed moving to and from a bed to a chair (including a wheelchair)?: A Little Help needed standing up from a chair using your arms (e.g., wheelchair or bedside chair)?: A Little Help needed to walk in hospital room?: A Little Help needed climbing 3-5 steps with a railing? : A Little 6 Click Score: 20    End of Session   Activity Tolerance: Other (comment);Patient limited by fatigue (limited by bowel incontinence) Patient left: with call bell/phone within reach;in bed;with bed alarm set Nurse Communication: Mobility status PT Visit Diagnosis: Other abnormalities of gait and mobility (R26.89)    Time: 0347-4259 PT Time Calculation (min) (ACUTE ONLY): 20 min   Charges:   PT Evaluation $PT Eval Low Complexity: [place], PT  Acute Rehab Dept ([place]) [phone] Pager [phone]  05/31/2020   [facility] 05/31/2020, 1:21 PM

## 2020-05-31 NOTE — Progress Notes (Addendum)
[facility]                                                                              Patient Demographics  Jeffrey Harris, is a 64 y.o. male, DOB - 1956/04/11, XTK:[id number]  Admit date - 05/29/2020   Admitting Physician Vernelle Emerald, MD  Outpatient Primary MD for the patient is Jeffrey Pretty, MD  Outpatient specialists:   LOS - 1  days   Medical records reviewed and are as summarized below:    Chief Complaint  Patient presents with  . 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 Marin Olp, history of gout presented to [facility], ED with generalized weakness, intractable nausea and vomiting.  Patient reported that for the past 4 weeks he has been bouts of nausea and vomiting.  Described as nonbilious, nonbloody vomiting typically after attempting to eat solids.  Patient ported experiencing numerous episodes daily.  Also reported generalized weakness, lightheadedness upon standing up.  Denied any fevers, diarrhea, sick contacts, recent travel.  Recently diagnosed with malignant ascites, undergone paracentesis 3 times since diagnosed on September 3.  Patient last received Gemzar and Abraxane on 9/7 Patient was found to be profoundly volume depleted in ED with moderate hyponatremia, severe hypokalemia, hypochloremia and alkalosis.  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 CT abd showed persistent enlargement of pancreatic head with a distal CBD stent in place  - GI consulted,  UGI series showed moderate to marked partial duodenal obstruction at level of pancreatic tumor and stent. - will continue clears for now, may need EGD, will defer to GI  Active Problems: Severe dehydration -UA positive for ketones, patient was placed on aggressive IVF  - No further vomiting, will dc IV fluids  Hypotension, likely due to hypovolemic shock from intractable nausea and vomiting/GI losses -On admission, SBP was in the 80's, was placed on aggressive IV fluid - cortisol 17.0, albumin 2.7, albumin IV x 2    Pancreatic cancer metastasized to liver [facility]), with recent diagnosis of malignant ascites status post paracentesis -Oncology has been consulted, Dr. Marin Olp following -Patient last received chemotherapy with Gemzar and Abraxane on 9/7    Malignant ascites -Last paracentesis on 9/20, for now hold off on paracentesis    Prolonged QT interval -EKG showed prolonged QTC 527, aggressively replete potassium, keep above 4 -Magnesium currently stable, 2.3, replace K  -Avoid QT prolonging meds  Constipation - will place on bowel regimen once tolerating p.o. diet  Failure to thrive Estimated body mass index is 23.98 kg/m as calculated from the  following:   Height as of this encounter: 5\' 11"  (1.803 m).   Weight as of this encounter: 78 kg.  Code Status: Full CODE STATUS DVT Prophylaxis:  Lovenox  Family Communication: Discussed all imaging results, lab results, explained to the patient. I discussed with patient's wife on phone 9/27, unable to make contact today on phone   Disposition Plan:     Status is: Inpatient   The patient will require care spanning > 2 midnights, inpatient because: IV treatments appropriate due to intensity of illness or inability to take PO  Dispo: The patient  is from: Home              Anticipated d/c is to: TBD              Anticipated d/c date is: 2 days              Patient currently is not medically stable to d/c.       Time Spent in minutes 35   Procedures:  None  Consultants:   Oncology, Dr. Marin Olp GI    Antimicrobials:   Anti-infectives (From admission, onward)   Start     Dose/Rate Route Frequency Ordered Stop   05/30/20 1930  metroNIDAZOLE (FLAGYL) IVPB 500 mg  Status:  Discontinued        500 mg 100 mL/hr over 60 Minutes Intravenous Every 8 hours 05/30/20 1859 05/31/20 1103         Medications  Scheduled Meds: . Chlorhexidine Gluconate Cloth  6 each Topical Daily  . enoxaparin (LOVENOX) injection  40 mg Subcutaneous Q24H  . gabapentin  300 mg Oral TID  . lipase/protease/amylase  72,000 Units Oral TID AC  . OLANZapine  10 mg Oral Daily  . pantoprazole (PROTONIX) IV  40 mg Intravenous Q12H  . potassium chloride  40 mEq Oral Once  . sodium chloride flush  10-40 mL Intracatheter Q12H  . tamsulosin  0.4 mg Oral Daily   Continuous Infusions: . metoCLOPramide (REGLAN) injection 20 mg (05/31/20 0932)   PRN Meds:.acetaminophen **OR** acetaminophen, alum & mag hydroxide-simeth, oxyCODONE-acetaminophen, prochlorperazine, sodium chloride flush      Subjective:   [name] was seen and examined today. Feeling somewhat better today, no vomiting today, no fevers. BP stable.  No dizziness or lightheadedness.  Patient denies chest pain, shortness of breath, new weakness, numbess, tingling.   Objective:   Vitals:   05/30/20 2103 05/31/20 0232 05/31/20 0423 05/31/20 1253  BP: 99/67  92/67 99/67  Pulse: 67  74 80  Resp: 19  20 18   Temp: 98 F (36.7 C)  97.9 F (36.6 C) 98.9 F (37.2 C)  TempSrc: Oral  Oral Oral  SpO2: 95%  90% 94%  Weight:  78 kg    Height:  5\' 11"  (1.803 m)      Intake/Output Summary (Last 24 hours) at 05/31/2020 1322 Last data filed at 05/31/2020 0600 Gross per 24 hour  Intake 2256.02  ml  Output 0 ml  Net 2256.02 ml     Wt Readings from Last 3 Encounters:  05/31/20 78 kg  05/17/20 79.4 kg  05/10/20 85.3 kg   Physical Exam  General: Alert and oriented x 3, NAD  Cardiovascular: S1 S2 clear, RRR. No pedal edema b/l  Respiratory: CTAB, no wheezing, rales or rhonchi  Gastrointestinal: Soft, nontender, nondistended, NBS  Ext: no pedal edema bilaterally  Neuro: no new deficits  Musculoskeletal: No cyanosis, clubbing  Skin: No rashes  Psych: Normal affect  and demeanor, alert and oriented x3     Data Reviewed:  I have personally reviewed following labs and imaging studies  Micro Results Recent Results (from the past 240 hour(s))  Respiratory Panel by RT PCR (Flu A&B, Covid) - Nasopharyngeal Swab     Status: None   Collection Time: 05/30/20  1:40 AM   Specimen: Nasopharyngeal Swab  Result Value Ref Range Status   SARS Coronavirus 2 by RT PCR NEGATIVE NEGATIVE Final    Comment: (NOTE) SARS-CoV-2 target nucleic acids are NOT DETECTED.  The SARS-CoV-2 RNA is generally detectable in upper respiratoy specimens during the acute phase of infection. The lowest concentration of SARS-CoV-2 viral copies this assay can detect is 131 copies/mL. A negative result does not preclude SARS-Cov-2 infection and should not be used as the sole basis for treatment or other patient management decisions. A negative result may occur with  improper specimen collection/handling, submission of specimen other than nasopharyngeal swab, presence of viral mutation(s) within the areas targeted by this assay, and inadequate number of viral copies (<131 copies/mL). A negative result must be combined with clinical observations, patient history, and epidemiological information. The expected result is Negative.  Fact Sheet for Patients:  PinkCheek.be  Fact Sheet for Healthcare Providers:  GravelBags.it  This test is no t yet  approved or cleared by the [place] FDA and  has been authorized for detection and/or diagnosis of SARS-CoV-2 by FDA under an Emergency Use Authorization (EUA). This EUA will remain  in effect (meaning this test can be used) for the duration of the COVID-19 declaration under Section 564(b)(1) of the Act, 21 U.S.C. section 360bbb-3(b)(1), unless the authorization is terminated or revoked sooner.     Influenza A by PCR NEGATIVE NEGATIVE Final   Influenza B by PCR NEGATIVE NEGATIVE Final    Comment: (NOTE) The Xpert Xpress SARS-CoV-2/FLU/RSV assay is intended as an aid in  the diagnosis of influenza from Nasopharyngeal swab specimens and  should not be used as a sole basis for treatment. Nasal washings and  aspirates are unacceptable for Xpert Xpress SARS-CoV-2/FLU/RSV  testing.  Fact Sheet for Patients: PinkCheek.be  Fact Sheet for Healthcare Providers: GravelBags.it  This test is not yet approved or cleared by the [place] FDA and  has been authorized for detection and/or diagnosis of SARS-CoV-2 by  FDA under an Emergency Use Authorization (EUA). This EUA will remain  in effect (meaning this test can be used) for the duration of the  Covid-19 declaration under Section 564(b)(1) of the Act, 21  U.S.C. section 360bbb-3(b)(1), unless the authorization is  terminated or revoked. Performed at [facility], [place] [address], [place], [place] [address]     Radiology Reports CT ABDOMEN PELVIS WO CONTRAST  Result Date: 05/30/2020 CLINICAL DATA:  Nausea and vomiting. History of pancreatic cancer. EXAM: CT ABDOMEN AND PELVIS WITHOUT CONTRAST TECHNIQUE: Multidetector CT imaging of the abdomen and pelvis was performed following the standard protocol without IV contrast. COMPARISON:  May 03, 2020 FINDINGS: Lower chest: Mild atelectasis and/or infiltrate is seen along the inferior aspect of the left upper  lobe. Hepatobiliary: There is a mild amount of perihepatic fluid. This is decreased in severity when compared to the prior exam. A few tiny foci of air are seen within the right lobe of the liver (axial CT images 16 through 19, CT series number 2). This is decreased in severity when compared to the prior study. A tiny focus of air is also seen within the  lumen of a mildly distended gallbladder. This is also decreased in severity. No gallstones, gallbladder wall thickening, or biliary dilatation. Pancreas: A common bile duct stent is seen within the head of the pancreas. Persistent enlargement of the pancreatic head is seen. It should be noted that this area, and surrounding lymph nodes, are limited in evaluation in the absence of intravenous contrast. Spleen: A mild amount of perisplenic fluid is seen. Spleen is otherwise normal in appearance. Adrenals/Urinary Tract: The right adrenal gland is normal in appearance. Stable 2.1 cm x 1.5 cm and 1.3 cm x 1.0 cm low-attenuation left adrenal masses are seen. 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 Kidneys are normal in size, without renal calculi, focal lesion, or hydronephrosis. The urinary bladder is partially contracted and otherwise normal in appearance. Stomach/Bowel: Stomach is within normal limits. Appendix appears normal. No evidence of bowel wall thickening, distention, or inflammatory changes. Vascular/Lymphatic: There is moderate severity calcification of the abdominal aorta and bilateral common iliac arteries, without evidence of aneurysmal dilatation. No enlarged abdominal or pelvic lymph nodes. Reproductive: Prostate is unremarkable. Other: No abdominal wall hernia or abnormality. Mild diffuse nonspecific mesenteric inflammatory fat stranding is seen throughout the abdomen. A mild to moderate amount of pelvic free fluid is seen. This is stable in appearance. Musculoskeletal: No acute or significant osseous findings. IMPRESSION: 1. Persistent enlargement of the  pancreatic head with a distal common bile duct stent in place. 2. Interval decrease in severity of tiny foci of air within the right lobe of the liver and gallbladder. 3. Mild amount of perihepatic and perisplenic fluid, decreased in severity when compared to the prior exam. 4. Stable low-attenuation left adrenal masses which may represent adrenal adenomas. 5. Mild to moderate amount of pelvic free fluid, stable. 6. Mild left upper lobe atelectasis and/or infiltrate. 7. Diffuse mesenteric inflammatory fat stranding secondary to diffuse mesenteritis cannot be excluded. 8. Aortic atherosclerosis. Aortic Atherosclerosis (ICD10-I70.0). Electronically Signed   By: Virgina Norfolk M.D.   On: 05/30/2020 16:07   CT CHEST ABDOMEN PELVIS W CONTRAST  Result Date: 05/03/2020 CLINICAL DATA:  Follow-up metastatic pancreatic carcinoma. Undergoing chemotherapy. EXAM: CT CHEST, ABDOMEN, AND PELVIS WITH CONTRAST TECHNIQUE: Multidetector CT imaging of the chest, abdomen and pelvis was performed following the standard protocol during bolus administration of intravenous contrast. CONTRAST:  165mL OMNIPAQUE IOHEXOL 300 MG/ML  SOLN COMPARISON:  02/10/2020 FINDINGS: CT CHEST FINDINGS Cardiovascular: No acute findings. Mediastinum/Lymph Nodes: Mild multinodular goiter again noted. No other masses or pathologically enlarged lymph nodes identified. Lungs/Pleura: No pulmonary infiltrate or masses identified. New tiny bilateral pleural effusions noted. Musculoskeletal: No suspicious bone lesions identified. Old fracture deformity of the left anterior 4th rib is stable in appearance. CT ABDOMEN AND PELVIS FINDINGS Hepatobiliary: No masses identified. Stable diffuse hepatic steatosis. Stent again seen in the distal common bile duct. Pneumobilia again noted. Pancreas: Soft tissue mass in the pancreatic head surrounding the distal common bile duct stent measures 4.1 x 3.5 cm on image 69/2, without significant change when remeasured in same  planes on prior study. Spleen:  Within normal limits in size and appearance. Adrenals/Urinary tract: Stable small low-attenuation left adrenal masses, consistent with benign adenomas. 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 No evidence of renal masses or hydronephrosis. No masses or hydronephrosis. Stomach/Bowel: No evidence of obstruction, inflammatory process, or abnormal fluid collections. Vascular/Lymphatic: Mild lymphadenopathy is again seen adjacent to the pancreatic head and in the porta hepatis, without significant change since previous study. Largest index lymph  node adjacent to the pancreatic head measures 1.5 cm on image 65/2, without significant change. Multiple sub-cm lymph nodes in the central small bowel mesentery also show no significant change. No new or increased sites of lymphadenopathy identified. No abdominal aortic aneurysm. Reproductive:  No mass or other significant abnormality identified. Other:  New moderate ascites and diffuse mesenteric edema noted. Musculoskeletal:  No suspicious bone lesions identified. IMPRESSION: No significant change in pancreatic head mass surrounding the distal common bile duct stent. Stable mild lymphadenopathy in peripancreatic region, porta hepatis, and central small bowel mesentery, consistent with metastatic disease. New moderate ascites and diffuse mesenteric edema. Stable hepatic steatosis and benign left adrenal adenomas. Electronically Signed   By: Marlaine Hind M.D.   On: 05/03/2020 13:23   US Paracentesis  Result Date: 05/23/2020 INDICATION: Patient with history of metastatic pancreatic cancer, recurrent ascites. Request made for diagnostic and therapeutic paracentesis. EXAM: ULTRASOUND GUIDED DIAGNOSTIC AND THERAPEUTIC PARACENTESIS MEDICATIONS: 1% lidocaine to skin and subcutaneous tissue COMPLICATIONS: None immediate. PROCEDURE: Informed written consent was obtained from the patient after a discussion of the risks, benefits  and alternatives to treatment. A timeout was performed prior to the initiation of the procedure. Initial ultrasound scanning demonstrates a small to moderate amount of ascites within the left lower abdominal quadrant. The left lower abdomen was prepped and draped in the usual sterile fashion. 1% lidocaine was used for local anesthesia. Following this, a 19 gauge, 7-cm, Yueh catheter was introduced. An ultrasound image was saved for documentation purposes. The paracentesis was performed. The catheter was removed and a dressing was applied. The patient tolerated the procedure well without immediate post procedural complication. FINDINGS: A total of approximately 1.5 liters of amber/blood-tinged fluid was removed. Samples were sent to the laboratory as requested by the clinical team. IMPRESSION: Successful ultrasound-guided diagnostic and therapeutic paracentesis yielding 1.5 liters of peritoneal fluid. Read by: Rowe Roald, PA-C Electronically Signed   By: Jacqulynn Cadet M.D.   On: 05/23/2020 15:13   US Paracentesis  Result Date: 05/06/2020 INDICATION: Patient with history of metastatic pancreatic cancer, ascites. Request made for diagnostic and therapeutic paracentesis. EXAM: ULTRASOUND GUIDED DIAGNOSTIC AND THERAPEUTIC PARACENTESIS MEDICATIONS: 1% lidocaine to skin and subcutaneous tissue COMPLICATIONS: None immediate. PROCEDURE: Informed written consent was obtained from the patient after a discussion of the risks, benefits and alternatives to treatment. A timeout was performed prior to the initiation of the procedure. Initial ultrasound scanning demonstrates a small to moderate amount of ascites within the left lower abdominal quadrant. The left lower abdomen was prepped and draped in the usual sterile fashion. 1% lidocaine was used for local anesthesia. Following this, a 19 gauge, 10-cm, Yueh catheter was introduced. An ultrasound image was saved for documentation purposes. The paracentesis was performed.  The catheter was removed and a dressing was applied. The patient tolerated the procedure well without immediate post procedural complication. FINDINGS: A total of approximately 1.5 liters of yellow fluid was removed. Samples were sent to the laboratory as requested by the clinical team. IMPRESSION: Successful ultrasound-guided diagnostic and therapeutic paracentesis yielding 1.5 liters of peritoneal fluid. Read by: Rowe Keghan, PA-C Electronically Signed   By: Jerilynn Mages.  Shick M.D.   On: 05/06/2020 11:53   DG CHEST PORT 1 VIEW  Result Date: 05/30/2020 CLINICAL DATA:  Hypoxia. EXAM: PORTABLE CHEST 1 VIEW COMPARISON:  Prior chest radiographs 05/30/2020. Chest CT 05/03/2020. FINDINGS: Right chest infusion port catheter with tip projecting in the region of the superior cavoatrial junction. Heart size within normal limits. Mild  ill-defined/linear opacities within the left lung base are more pronounced as compared to the chest radiographs performed earlier the same day and favored to reflect atelectasis. The right lung is clear. No evidence of pleural effusion or pneumothorax. Redemonstrated sclerosis within the anterior left fourth rib which is likely posttraumatic in etiology. IMPRESSION: Mild ill-defined/linear opacities within the left lung base are more pronounced as compared to the chest radiographs performed earlier the same day and favored to reflect atelectasis. Redemonstrated sclerosis within the anterior left fourth rib which is likely posttraumatic in etiology. Continued attention recommended on follow-up. Electronically Signed   By: Kellie Simmering DO   On: 05/30/2020 08:20   DG Abd 2 Views  Result Date: 05/17/2020 CLINICAL DATA:  Metastatic pancreatic cancer.  Nausea and vomiting. EXAM: X-RAY ABDOMEN 2 VIEWS COMPARISON:  02/10/2020 FINDINGS: Right upper quadrant stent, consistent with biliary ductal stent, as seen previously. Bowel gas pattern does not show evidence of ileus, obstruction or free air. No  significant bone findings. IMPRESSION: Right upper quadrant stent. No evidence of ileus or obstruction. No free air. Electronically Signed   By: Nelson Chimes M.D.   On: 05/17/2020 12:20   DG ABD ACUTE 2+V W 1V CHEST  Result Date: 05/30/2020 CLINICAL DATA:  Nausea, metastatic pancreatic carcinoma EXAM: ACUTE ABDOMEN SERIES (2 VIEW ABDOMEN AND 1 VIEW CHEST) COMPARISON:  Radiograph 05/17/2020, CT 05/03/2020 FINDINGS: Right IJ approach Port-A-Cath tip terminates at the superior cavoatrial junction. Telemetry leads overlie the chest. Few streaky likely atelectatic opacities in the lung bases. No consolidation, features of edema, pneumothorax, or effusion. Pulmonary vascularity is normally distributed. The aorta is calcified. The remaining cardiomediastinal contours are unremarkable. A biliary stent is seen in the upper abdomen. No high-grade obstructive bowel gas pattern. Moderate colonic stool burden. No subdiaphragmatic free air. No suspicious calcifications. Increased attenuation of the abdomen may reflect a degree of ascites. No acute or suspicious osseous lesions. Degenerative changes in the spine, shoulders, hips and pelvis. IMPRESSION: 1. Atelectatic changes in the lungs. 2. No high-grade obstructive bowel gas pattern.  No free air. 3. Moderate colonic stool burden. 4. Possible ascites. 5.  Aortic Atherosclerosis (ICD10-I70.0). Electronically Signed   By: Lovena Le M.D.   On: 05/30/2020 00:33   DG UGI W SINGLE CM (SOL OR THIN BA)  Result Date: 05/31/2020 CLINICAL DATA:  Nausea vomiting, history of pancreatic neoplasm EXAM: WATER SOLUBLE UPPER GI SERIES TECHNIQUE: Single-column upper GI series was performed using water soluble contrast. CONTRAST:  140mL OMNIPAQUE IOHEXOL 300 MG/ML  SOLN COMPARISON:  CT abdomen and pelvis from 05/30/2020 FLUOROSCOPY TIME:  Fluoroscopy Time:  1 minutes 35 seconds Radiation Exposure Index (if provided by the fluoroscopic device): 16.6 mGy Number of Acquired Spot Images: 1  FINDINGS: Scout image demonstrates a biliary stent in place over the RIGHT upper quadrant projecting to the RIGHT of the spine. With the head of the table slightly elevated swallowing of water-soluble contrast was performed in bilateral oblique positioning. The esophagus was found to be patulous, limited assessment given the use of water-soluble contrast. Contrast flowed into a markedly distended stomach and was immediately diluted with the luminal contents. Gastric folds appear thickened for the degree of distension though not well evaluated on water-soluble assessment. Transition from esophagus to stomach was grossly normal aside from the esophageal dilation and the presence of reflux which was demonstrated from the stomach into the esophagus during the exam. The patient was then placed into the RIGHT lateral position and the RAO position. Only a  small amount of contrast could be seen traversing the area of the stent and the area of the pancreatic and duodenal groove. Dilation of the duodenum proximal to this area was noted. Patient was LEFT in this position and progressive distension of the duodenal bulb and proximal duodenum, proximal to the stent was identified with small amounts of contrast passing intermittently across the site of presumed duodenal narrowing. The patient was then placed back into the supine position and gastric dilation and reflux again documented with some persistent contrast first and second portion of the duodenum leading up to the site of partial obstruction. IMPRESSION: 1. Moderate-to-marked partial duodenal obstruction at the level of the pancreatic tumor and stent as described. Only a small amount of contrast passed beyond this area during the examination in the RIGHT lateral position and RAO position. 2. This results in marked gastric distension which is exhibited on the current study and in gastroesophageal reflux. Electronically Signed   By: Zetta Bills M.D.   On: 05/31/2020  09:31   IR Paracentesis  Result Date: 05/16/2020 INDICATION: Patient with a history of pancreatic cancer and ascites presents today for a therapeutic paracentesis. EXAM: ULTRASOUND GUIDED PARACENTESIS MEDICATIONS: 1% lidocaine 10 mL COMPLICATIONS: None immediate. PROCEDURE: Informed written consent was obtained from the patient after a discussion of the risks, benefits and alternatives to treatment. A timeout was performed prior to the initiation of the procedure. Initial ultrasound scanning demonstrates a large amount of ascites within the right lower abdominal quadrant. The right lower abdomen was prepped and draped in the usual sterile fashion. 1% lidocaine was used for local anesthesia. Following this, a 19 gauge, 7-cm, Yueh catheter was introduced. An ultrasound image was saved for documentation purposes. The paracentesis was performed. The catheter was removed and a dressing was applied. The patient tolerated the procedure well without immediate post procedural complication. FINDINGS: A total of approximately 1 L of clear yellow fluid was removed. IMPRESSION: Successful ultrasound-guided paracentesis yielding 1 liters of peritoneal fluid. Read by: Soyla Dryer, NP Electronically Signed   By: Corrie Mckusick D.O.   On: 05/16/2020 15:13    Lab Data:  CBC: Recent Labs  Lab 05/29/20 2247 05/30/20 0614 05/31/20 0930  WBC 9.4 6.8 7.2  NEUTROABS  --   --  5.8  HGB 15.2 12.0* 12.2*  HCT 42.4 34.2* 35.7*  MCV 93.8 96.6 98.1  PLT 260 155 626*   Basic Metabolic Panel: Recent Labs  Lab 05/29/20 2247 05/30/20 0614 05/30/20 1147 05/31/20 0930  NA 127* 126* 126* 125*  K 2.4* 2.6* 3.1* 3.2*  CL <65* 66* 72* 80*  CO2 42* 44* 40* 34*  GLUCOSE 118* 110* 89 95  BUN 19 17 17 12   CREATININE 1.01 0.81 0.76 0.59*  CALCIUM 8.6* 7.4* 7.2* 7.3*  MG 2.4  --   --  2.3   GFR: Estimated Creatinine Clearance: 103.3 mL/min (A) (by C-G formula based on SCr of 0.59 mg/dL (L)). Liver Function  Tests: Recent Labs  Lab 05/29/20 2247 05/31/20 0930  AST 39 34  ALT 20 20  ALKPHOS 182* 168*  BILITOT 1.8* 1.3*  PROT 7.3 5.6*  ALBUMIN 3.3* 2.7*   Recent Labs  Lab 05/29/20 2247  LIPASE 29   No results for input(s): AMMONIA in the last 168 hours. Coagulation Profile: No results for input(s): INR, PROTIME in the last 168 hours. Cardiac Enzymes: No results for input(s): CKTOTAL, CKMB, CKMBINDEX, TROPONINI in the last 168 hours. BNP (last 3 results) No results for  input(s): PROBNP in the last 8760 hours. HbA1C: No results for input(s): HGBA1C in the last 72 hours. CBG: Recent Labs  Lab 05/29/20 2245  GLUCAP 137*   Lipid Profile: No results for input(s): CHOL, HDL, LDLCALC, TRIG, CHOLHDL, LDLDIRECT in the last 72 hours. Thyroid Function Tests: No results for input(s): TSH, T4TOTAL, FREET4, T3FREE, THYROIDAB in the last 72 hours. Anemia Panel: No results for input(s): VITAMINB12, FOLATE, FERRITIN, TIBC, IRON, RETICCTPCT in the last 72 hours. Urine analysis:    Component Value Date/Time   COLORURINE YELLOW 05/30/2020 [place] 05/30/2020 1156   LABSPEC 1.021 05/30/2020 1156   PHURINE 8.0 05/30/2020 1156   GLUCOSEU NEGATIVE 05/30/2020 1156   HGBUR NEGATIVE 05/30/2020 1156   BILIRUBINUR SMALL (A) 05/30/2020 1156   KETONESUR 80 (A) 05/30/2020 1156   PROTEINUR 30 (A) 05/30/2020 1156   NITRITE NEGATIVE 05/30/2020 1156   LEUKOCYTESUR NEGATIVE 05/30/2020 1156     Weda Baumgarner M.D. [facility] 05/31/2020, 1:22 PM   Call night coverage person covering after 7pm

## 2020-05-31 NOTE — Progress Notes (Signed)
Patient sees Deliah Goody, physician assistant at [facility].  Subjective: Has not had any nausea or vomiting since he was at triage yesterday. Reports having a pasty bowel movement yesterday and 1 bowel movement today. Denies abdominal pain.  Objective: Vital signs in last 24 hours: Temp:  [97.7 F (36.5 C)-98 F (36.7 C)] 97.9 F (36.6 C) (09/28 0423) Pulse Rate:  [63-76] 74 (09/28 0423) Resp:  [15-23] 20 (09/28 0423) BP: (81-111)/(61-78) 92/67 (09/28 0423) SpO2:  [73 %-100 %] 90 % (09/28 0423) Weight:  [78 kg] 78 kg (09/28 0232) Weight change:  Last BM Date: 05/30/20  PE: No obvious pallor or icterus GENERAL: Moist mucous membranes, no signs of dehydration ABDOMEN: Soft, nondistended, nontender, normoactive bowel sounds EXTREMITIES: No deformity  Lab Results: Results for orders placed or performed during the hospital encounter of 05/29/20 (from the past 48 hour(s))  CBG monitoring, ED     Status: Abnormal   Collection Time: 05/29/20 10:45 PM  Result Value Ref Range   Glucose-Capillary 137 (H) 70 - 99 mg/dL    Comment: Glucose reference range applies only to samples taken after fasting for at least 8 hours.  Lipase, blood     Status: None   Collection Time: 05/29/20 10:47 PM  Result Value Ref Range   Lipase 29 11 - 51 U/L    Comment: Performed at [facility], [place] [address], [place], [place] [address]  Comprehensive metabolic panel     Status: Abnormal   Collection Time: 05/29/20 10:47 PM  Result Value Ref Range   Sodium 127 (L) 135 - 145 mmol/L   Potassium 2.4 (LL) 3.5 - 5.1 mmol/L    Comment: CRITICAL RESULT CALLED TO, READ BACK BY AND VERIFIED WITH: A,HODGES AT 2334 ON 05/29/20 BY A,MOHAMED    Chloride <65 (LL) 98 - 111 mmol/L    Comment: CRITICAL RESULT CALLED TO, READ BACK BY AND VERIFIED WITH: A,HODGES AT 2334 ON 05/29/20 BY A,MOHAMED    CO2 42 (H) 22 - 32 mmol/L   Glucose, Bld 118 (H) 70 - 99 mg/dL    Comment: Glucose reference range  applies only to samples taken after fasting for at least 8 hours.   BUN 19 8 - 23 mg/dL   Creatinine, Ser 1.01 0.61 - 1.24 mg/dL   Calcium 8.6 (L) 8.9 - 10.3 mg/dL   Total Protein 7.3 6.5 - 8.1 g/dL   Albumin 3.3 (L) 3.5 - 5.0 g/dL   AST 39 15 - 41 U/L   ALT 20 0 - 44 U/L   Alkaline Phosphatase 182 (H) 38 - 126 U/L   Total Bilirubin 1.8 (H) 0.3 - 1.2 mg/dL   GFR calc non Af Amer >60 >60 mL/min   GFR calc Af Amer >60 >60 mL/min   Anion gap NOT CALCULATED 5 - 15    Comment: Performed at [facility], [place] [address] [place], [place] [address]  CBC     Status: Abnormal   Collection Time: 05/29/20 10:47 PM  Result Value Ref Range   WBC 9.4 4.0 - 10.5 K/uL   RBC 4.52 4.22 - 5.81 MIL/uL   Hemoglobin 15.2 13.0 - 17.0 g/dL   HCT 42.4 39 - 52 %   MCV 93.8 80.0 - 100.0 fL   MCH 33.6 26.0 - 34.0 pg   MCHC 35.8 30.0 - 36.0 g/dL   RDW 18.8 (H) 11.5 - 15.5 %   Platelets 260 150 - 400 K/uL   nRBC 0.0 0.0 -  0.2 %    Comment: Performed at [facility], [place] [address], [place], [place] [address]  Magnesium     Status: None   Collection Time: 05/29/20 10:47 PM  Result Value Ref Range   Magnesium 2.4 1.7 - 2.4 mg/dL    Comment: Performed at [facility], [place] [address] [place], [place] [address]  Respiratory Panel by RT PCR (Flu A&B, Covid) - Nasopharyngeal Swab     Status: None   Collection Time: 05/30/20  1:40 AM   Specimen: Nasopharyngeal Swab  Result Value Ref Range   SARS Coronavirus 2 by RT PCR NEGATIVE NEGATIVE    Comment: (NOTE) SARS-CoV-2 target nucleic acids are NOT DETECTED.  The SARS-CoV-2 RNA is generally detectable in upper respiratoy specimens during the acute phase of infection. The lowest concentration of SARS-CoV-2 viral copies this assay can detect is 131 copies/mL. A negative result does not preclude SARS-Cov-2 infection and should not be used as the sole basis for treatment or other patient management decisions.  A negative result may occur with  improper specimen collection/handling, submission of specimen other than nasopharyngeal swab, presence of viral mutation(s) within the areas targeted by this assay, and inadequate number of viral copies (<131 copies/mL). A negative result must be combined with clinical observations, patient history, and epidemiological information. The expected result is Negative.  Fact Sheet for Patients:  PinkCheek.be  Fact Sheet for Healthcare Providers:  GravelBags.it  This test is no t yet approved or cleared by the [place] FDA and  has been authorized for detection and/or diagnosis of SARS-CoV-2 by FDA under an Emergency Use Authorization (EUA). This EUA will remain  in effect (meaning this test can be used) for the duration of the COVID-19 declaration under Section 564(b)(1) of the Act, 21 U.S.C. section 360bbb-3(b)(1), unless the authorization is terminated or revoked sooner.     Influenza A by PCR NEGATIVE NEGATIVE   Influenza B by PCR NEGATIVE NEGATIVE    Comment: (NOTE) The Xpert Xpress SARS-CoV-2/FLU/RSV assay is intended as an aid in  the diagnosis of influenza from Nasopharyngeal swab specimens and  should not be used as a sole basis for treatment. Nasal washings and  aspirates are unacceptable for Xpert Xpress SARS-CoV-2/FLU/RSV  testing.  Fact Sheet for Patients: PinkCheek.be  Fact Sheet for Healthcare Providers: GravelBags.it  This test is not yet approved or cleared by the [place] FDA and  has been authorized for detection and/or diagnosis of SARS-CoV-2 by  FDA under an Emergency Use Authorization (EUA). This EUA will remain  in effect (meaning this test can be used) for the duration of the  Covid-19 declaration under Section 564(b)(1) of the Act, 21  U.S.C. section 360bbb-3(b)(1), unless the authorization is   terminated or revoked. Performed at [facility], [place] [address], [place], [place] [address]   Basic metabolic panel     Status: Abnormal   Collection Time: 05/30/20  6:14 AM  Result Value Ref Range   Sodium 126 (L) 135 - 145 mmol/L   Potassium 2.6 (LL) 3.5 - 5.1 mmol/L    Comment: CRITICAL RESULT CALLED TO, READ BACK BY AND VERIFIED WITH: DOWD,P @ 0750 ON [id number] BY POTEAT,S    Chloride 66 (L) 98 - 111 mmol/L   CO2 44 (H) 22 - 32 mmol/L   Glucose, Bld 110 (H) 70 - 99 mg/dL    Comment: Glucose reference range applies only to samples taken after fasting for at least 8 hours.  BUN 17 8 - 23 mg/dL   Creatinine, Ser 0.81 0.61 - 1.24 mg/dL   Calcium 7.4 (L) 8.9 - 10.3 mg/dL   GFR calc non Af Amer >60 >60 mL/min   GFR calc Af Amer >60 >60 mL/min   Anion gap 16 (H) 5 - 15    Comment: Performed at [facility], [place] [address] [place], [place] [address]  CBC     Status: Abnormal   Collection Time: 05/30/20  6:14 AM  Result Value Ref Range   WBC 6.8 4.0 - 10.5 K/uL   RBC 3.54 (L) 4.22 - 5.81 MIL/uL   Hemoglobin 12.0 (L) 13.0 - 17.0 g/dL   HCT 34.2 (L) 39 - 52 %   MCV 96.6 80.0 - 100.0 fL   MCH 33.9 26.0 - 34.0 pg   MCHC 35.1 30.0 - 36.0 g/dL   RDW 18.9 (H) 11.5 - 15.5 %   Platelets 155 150 - 400 K/uL   nRBC 0.0 0.0 - 0.2 %    Comment: Performed at [facility], [place] [address], [place], [place] [address]  Cortisol     Status: None   Collection Time: 05/30/20 11:47 AM  Result Value Ref Range   Cortisol, Plasma 17.0 ug/dL    Comment: (NOTE) AM    6.7 - 22.6 ug/dL PM   <10.0       ug/dL Performed at [place] [address], [place], [place] [address]   Basic metabolic panel     Status: Abnormal   Collection Time: 05/30/20 11:47 AM  Result Value Ref Range   Sodium 126 (L) 135 - 145 mmol/L   Potassium 3.1 (L) 3.5 - 5.1 mmol/L   Chloride 72 (L) 98 - 111 mmol/L   CO2 40 (H) 22 - 32 mmol/L   Glucose, Bld 89 70 -  99 mg/dL    Comment: Glucose reference range applies only to samples taken after fasting for at least 8 hours.   BUN 17 8 - 23 mg/dL   Creatinine, Ser 0.76 0.61 - 1.24 mg/dL   Calcium 7.2 (L) 8.9 - 10.3 mg/dL   GFR calc non Af Amer >60 >60 mL/min   GFR calc Af Amer >60 >60 mL/min   Anion gap 14 5 - 15    Comment: Performed at [facility], [place] [address] [place], [place] [address]  Osmolality     Status: Abnormal   Collection Time: 05/30/20 11:47 AM  Result Value Ref Range   Osmolality 264 (L) 275 - 295 mOsm/kg    Comment: Performed at [facility], [place] [address], [place], [place] [address]  Urinalysis, Routine w reflex microscopic     Status: Abnormal   Collection Time: 05/30/20 11:56 AM  Result Value Ref Range   Color, Urine YELLOW YELLOW   APPearance CLEAR CLEAR   Specific Gravity, Urine 1.021 1.005 - 1.030   pH 8.0 5.0 - 8.0   Glucose, UA NEGATIVE NEGATIVE mg/dL   Hgb urine dipstick NEGATIVE NEGATIVE   Bilirubin Urine SMALL (A) NEGATIVE   Ketones, ur 80 (A) NEGATIVE mg/dL   Protein, ur 30 (A) NEGATIVE mg/dL   Nitrite NEGATIVE NEGATIVE   Leukocytes,Ua NEGATIVE NEGATIVE   RBC / HPF 0-5 0 - 5 RBC/hpf   WBC, UA 0-5 0 - 5 WBC/hpf   Bacteria, UA RARE (A) NONE SEEN   Mucus PRESENT    Hyaline Casts, UA PRESENT     Comment: Performed  at [facility], [place] [address], [place], [place] [address]  Osmolality, urine     Status: None   Collection Time: 05/30/20 11:56 AM  Result Value Ref Range   Osmolality, Ur 682 300 - 900 mOsm/kg    Comment: Performed at [place] [address] [place], [place] [address]  Sodium, urine, random     Status: None   Collection Time: 05/30/20 11:56 AM  Result Value Ref Range   Sodium, Ur 144 mmol/L    Comment: Performed at [facility], [place] [address] [place], [place] [address]  Lactic acid, plasma     Status: None   Collection Time: 05/30/20 12:48 PM  Result Value Ref Range    Lactic Acid, Venous 0.9 0.5 - 1.9 mmol/L    Comment: Performed at [facility], [place] [address], [place],  [address]    Studies/Results: CT ABDOMEN PELVIS WO CONTRAST  Result Date: 05/30/2020 CLINICAL DATA:  Nausea and vomiting. History of pancreatic cancer. EXAM: CT ABDOMEN AND PELVIS WITHOUT CONTRAST TECHNIQUE: Multidetector CT imaging of the abdomen and pelvis was performed following the standard protocol without IV contrast. COMPARISON:  May 03, 2020 FINDINGS: Lower chest: Mild atelectasis and/or infiltrate is seen along the inferior aspect of the left upper lobe. Hepatobiliary: There is a mild amount of perihepatic fluid. This is decreased in severity when compared to the prior exam. A few tiny foci of air are seen within the right lobe of the liver (axial CT images 16 through 19, CT series number 2). This is decreased in severity when compared to the prior study. A tiny focus of air is also seen within the lumen of a mildly distended gallbladder. This is also decreased in severity. No gallstones, gallbladder wall thickening, or biliary dilatation. Pancreas: A common bile duct stent is seen within the head of the pancreas. Persistent enlargement of the pancreatic head is seen. It should be noted that this area, and surrounding lymph nodes, are limited in evaluation in the absence of intravenous contrast. Spleen: A mild amount of perisplenic fluid is seen. Spleen is otherwise normal in appearance. Adrenals/Urinary Tract: The right adrenal gland is normal in appearance. Stable 2.1 cm x 1.5 cm and 1.3 cm x 1.0 cm low-attenuation left adrenal masses are seen. 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 Kidneys are normal in size, without renal calculi, focal lesion, or hydronephrosis. The urinary bladder is partially contracted and otherwise normal in appearance. Stomach/Bowel: Stomach is within normal limits. Appendix appears normal. No evidence of bowel wall thickening, distention, or  inflammatory changes. Vascular/Lymphatic: There is moderate severity calcification of the abdominal aorta and bilateral common iliac arteries, without evidence of aneurysmal dilatation. No enlarged abdominal or pelvic lymph nodes. Reproductive: Prostate is unremarkable. Other: No abdominal wall hernia or abnormality. Mild diffuse nonspecific mesenteric inflammatory fat stranding is seen throughout the abdomen. A mild to moderate amount of pelvic free fluid is seen. This is stable in appearance. Musculoskeletal: No acute or significant osseous findings. IMPRESSION: 1. Persistent enlargement of the pancreatic head with a distal common bile duct stent in place. 2. Interval decrease in severity of tiny foci of air within the right lobe of the liver and gallbladder. 3. Mild amount of perihepatic and perisplenic fluid, decreased in severity when compared to the prior exam. 4. Stable low-attenuation left adrenal masses which may represent adrenal adenomas. 5. Mild to moderate amount of pelvic free fluid, stable. 6. Mild left upper lobe atelectasis and/or  infiltrate. 7. Diffuse mesenteric inflammatory fat stranding secondary to diffuse mesenteritis cannot be excluded. 8. Aortic atherosclerosis. Aortic Atherosclerosis (ICD10-I70.0). Electronically Signed   By: Virgina Norfolk M.D.   On: 05/30/2020 16:07   DG CHEST PORT 1 VIEW  Result Date: 05/30/2020 CLINICAL DATA:  Hypoxia. EXAM: PORTABLE CHEST 1 VIEW COMPARISON:  Prior chest radiographs 05/30/2020. Chest CT 05/03/2020. FINDINGS: Right chest infusion port catheter with tip projecting in the region of the superior cavoatrial junction. Heart size within normal limits. Mild ill-defined/linear opacities within the left lung base are more pronounced as compared to the chest radiographs performed earlier the same day and favored to reflect atelectasis. The right lung is clear. No evidence of pleural effusion or pneumothorax. Redemonstrated sclerosis within the anterior left  fourth rib which is likely posttraumatic in etiology. IMPRESSION: Mild ill-defined/linear opacities within the left lung base are more pronounced as compared to the chest radiographs performed earlier the same day and favored to reflect atelectasis. Redemonstrated sclerosis within the anterior left fourth rib which is likely posttraumatic in etiology. Continued attention recommended on follow-up. Electronically Signed   By: Kellie Simmering DO   On: 05/30/2020 08:20   DG ABD ACUTE 2+V W 1V CHEST  Result Date: 05/30/2020 CLINICAL DATA:  Nausea, metastatic pancreatic carcinoma EXAM: ACUTE ABDOMEN SERIES (2 VIEW ABDOMEN AND 1 VIEW CHEST) COMPARISON:  Radiograph 05/17/2020, CT 05/03/2020 FINDINGS: Right IJ approach Port-A-Cath tip terminates at the superior cavoatrial junction. Telemetry leads overlie the chest. Few streaky likely atelectatic opacities in the lung bases. No consolidation, features of edema, pneumothorax, or effusion. Pulmonary vascularity is normally distributed. The aorta is calcified. The remaining cardiomediastinal contours are unremarkable. A biliary stent is seen in the upper abdomen. No high-grade obstructive bowel gas pattern. Moderate colonic stool burden. No subdiaphragmatic free air. No suspicious calcifications. Increased attenuation of the abdomen may reflect a degree of ascites. No acute or suspicious osseous lesions. Degenerative changes in the spine, shoulders, hips and pelvis. IMPRESSION: 1. Atelectatic changes in the lungs. 2. No high-grade obstructive bowel gas pattern.  No free air. 3. Moderate colonic stool burden. 4. Possible ascites. 5.  Aortic Atherosclerosis (ICD10-I70.0). Electronically Signed   By: Lovena Le M.D.   On: 05/30/2020 00:33    Medications: I have reviewed the patient's current medications.  Assessment: Metastatic pancreatic adenocarcinoma Malignant ascites Last dose of chemotherapy on 05/10/2020 with plans to change chemotherapy on 06/01/2020 CT shows  persistent pancreatic head enlargement and CBD stent in place decreased fluid around liver and spleen and mild to moderate fluid in pelvic area with diffuse mesenteric inflammatory fat stranding secondary to diffuse mesenteritis Abdominal x-ray showed no high-grade obstructive bowel gas pattern with moderate colonic stool burden  Hypokalemia, hyponatremia with normal renal function and alkalosis, bicarb 40  Plan: Supportive management, IV fluid resuscitation, electrolyte replacement Tolerating clear liquid diet, advance as tolerated I would discontinue IV Flagyl without clear indication of infection as it may worsen nausea Agree with PPI twice daily and Compazine 10 mg every 6 hours as needed Patient is also receiving Reglan and states that Zofran did not help as an outpatient  Ronnette Juniper, MD 05/31/2020, 8:37 AM

## 2020-06-01 ENCOUNTER — Inpatient Hospital Stay (HOSPITAL_COMMUNITY): Payer: BC Managed Care – PPO | Admitting: Anesthesiology

## 2020-06-01 ENCOUNTER — Inpatient Hospital Stay: Payer: BC Managed Care – PPO | Admitting: Hematology & Oncology

## 2020-06-01 ENCOUNTER — Inpatient Hospital Stay: Payer: BC Managed Care – PPO

## 2020-06-01 ENCOUNTER — Encounter (HOSPITAL_COMMUNITY): Payer: Self-pay | Admitting: Internal Medicine

## 2020-06-01 ENCOUNTER — Encounter (HOSPITAL_COMMUNITY)
Admission: EM | Disposition: A | Discharge status: Hospice | Payer: Self-pay | Source: Home / Self Care | Attending: Internal Medicine

## 2020-06-01 ENCOUNTER — Inpatient Hospital Stay (HOSPITAL_COMMUNITY): Payer: BC Managed Care – PPO

## 2020-06-01 DIAGNOSIS — Z20822 Contact with and (suspected) exposure to covid-19: Secondary | ICD-10-CM | POA: Diagnosis not present

## 2020-06-01 DIAGNOSIS — C259 Malignant neoplasm of pancreas, unspecified: Secondary | ICD-10-CM | POA: Diagnosis not present

## 2020-06-01 DIAGNOSIS — E878 Other disorders of electrolyte and fluid balance, not elsewhere classified: Secondary | ICD-10-CM

## 2020-06-01 DIAGNOSIS — I959 Hypotension, unspecified: Secondary | ICD-10-CM

## 2020-06-01 DIAGNOSIS — E860 Dehydration: Secondary | ICD-10-CM | POA: Diagnosis not present

## 2020-06-01 DIAGNOSIS — Z515 Encounter for palliative care: Secondary | ICD-10-CM

## 2020-06-01 DIAGNOSIS — Z7189 Other specified counseling: Secondary | ICD-10-CM

## 2020-06-01 DIAGNOSIS — Z66 Do not resuscitate: Secondary | ICD-10-CM | POA: Diagnosis not present

## 2020-06-01 DIAGNOSIS — R6521 Severe sepsis with septic shock: Secondary | ICD-10-CM

## 2020-06-01 DIAGNOSIS — R531 Weakness: Secondary | ICD-10-CM

## 2020-06-01 DIAGNOSIS — J690 Pneumonitis due to inhalation of food and vomit: Secondary | ICD-10-CM | POA: Diagnosis not present

## 2020-06-01 DIAGNOSIS — R112 Nausea with vomiting, unspecified: Secondary | ICD-10-CM | POA: Diagnosis not present

## 2020-06-01 DIAGNOSIS — E876 Hypokalemia: Secondary | ICD-10-CM | POA: Diagnosis not present

## 2020-06-01 DIAGNOSIS — A419 Sepsis, unspecified organism: Secondary | ICD-10-CM | POA: Diagnosis not present

## 2020-06-01 DIAGNOSIS — C784 Secondary malignant neoplasm of small intestine: Secondary | ICD-10-CM | POA: Diagnosis not present

## 2020-06-01 DIAGNOSIS — R6520 Severe sepsis without septic shock: Secondary | ICD-10-CM

## 2020-06-01 HISTORY — PX: ESOPHAGOGASTRODUODENOSCOPY (EGD) WITH PROPOFOL: SHX5813

## 2020-06-01 LAB — CBC WITH DIFFERENTIAL/PLATELET
Abs Immature Granulocytes: 0.21 10*3/uL — ABNORMAL HIGH (ref 0.00–0.07)
Basophils Absolute: 0.2 10*3/uL — ABNORMAL HIGH (ref 0.0–0.1)
Basophils Relative: 1 %
Eosinophils Absolute: 0.2 10*3/uL (ref 0.0–0.5)
Eosinophils Relative: 1 %
HCT: 40 % (ref 39.0–52.0)
Hemoglobin: 13.8 g/dL (ref 13.0–17.0)
Immature Granulocytes: 1 %
Lymphocytes Relative: 2 %
Lymphs Abs: 0.6 10*3/uL — ABNORMAL LOW (ref 0.7–4.0)
MCH: 34.2 pg — ABNORMAL HIGH (ref 26.0–34.0)
MCHC: 34.5 g/dL (ref 30.0–36.0)
MCV: 99 fL (ref 80.0–100.0)
Monocytes Absolute: 1.2 10*3/uL — ABNORMAL HIGH (ref 0.1–1.0)
Monocytes Relative: 4 %
Neutro Abs: 30.6 10*3/uL — ABNORMAL HIGH (ref 1.7–7.7)
Neutrophils Relative %: 91 %
Platelets: 149 10*3/uL — ABNORMAL LOW (ref 150–400)
RBC: 4.04 MIL/uL — ABNORMAL LOW (ref 4.22–5.81)
RDW: 19.4 % — ABNORMAL HIGH (ref 11.5–15.5)
WBC: 33 10*3/uL — ABNORMAL HIGH (ref 4.0–10.5)
nRBC: 0 % (ref 0.0–0.2)

## 2020-06-01 LAB — COMPREHENSIVE METABOLIC PANEL
ALT: 18 U/L (ref 0–44)
AST: 35 U/L (ref 15–41)
Albumin: 2.5 g/dL — ABNORMAL LOW (ref 3.5–5.0)
Alkaline Phosphatase: 179 U/L — ABNORMAL HIGH (ref 38–126)
Anion gap: 16 — ABNORMAL HIGH (ref 5–15)
BUN: 10 mg/dL (ref 8–23)
CO2: 25 mmol/L (ref 22–32)
Calcium: 7.3 mg/dL — ABNORMAL LOW (ref 8.9–10.3)
Chloride: 81 mmol/L — ABNORMAL LOW (ref 98–111)
Creatinine, Ser: 0.95 mg/dL (ref 0.61–1.24)
GFR calc Af Amer: >60 mL/min (ref >60)
GFR calc non Af Amer: >60 mL/min (ref >60)
Glucose, Bld: 98 mg/dL (ref 70–99)
Potassium: 3.9 mmol/L (ref 3.5–5.1)
Sodium: 122 mmol/L — ABNORMAL LOW (ref 135–145)
Total Bilirubin: 2 mg/dL — ABNORMAL HIGH (ref 0.3–1.2)
Total Protein: 5.5 g/dL — ABNORMAL LOW (ref 6.5–8.1)

## 2020-06-01 LAB — CBC
HCT: 32.9 % — ABNORMAL LOW (ref 39.0–52.0)
Hemoglobin: 11.1 g/dL — ABNORMAL LOW (ref 13.0–17.0)
MCH: 33.1 pg (ref 26.0–34.0)
MCHC: 33.7 g/dL (ref 30.0–36.0)
MCV: 98.2 fL (ref 80.0–100.0)
Platelets: 113 10*3/uL — ABNORMAL LOW (ref 150–400)
RBC: 3.35 MIL/uL — ABNORMAL LOW (ref 4.22–5.81)
RDW: 18.7 % — ABNORMAL HIGH (ref 11.5–15.5)
WBC: 9.9 10*3/uL (ref 4.0–10.5)
nRBC: 0 % (ref 0.0–0.2)

## 2020-06-01 LAB — BASIC METABOLIC PANEL
Anion gap: 11 (ref 5–15)
BUN: 8 mg/dL (ref 8–23)
CO2: 34 mmol/L — ABNORMAL HIGH (ref 22–32)
Calcium: 7.6 mg/dL — ABNORMAL LOW (ref 8.9–10.3)
Chloride: 82 mmol/L — ABNORMAL LOW (ref 98–111)
Creatinine, Ser: 0.61 mg/dL (ref 0.61–1.24)
GFR calc Af Amer: >60 mL/min (ref >60)
GFR calc non Af Amer: >60 mL/min (ref >60)
Glucose, Bld: 98 mg/dL (ref 70–99)
Potassium: 3 mmol/L — ABNORMAL LOW (ref 3.5–5.1)
Sodium: 127 mmol/L — ABNORMAL LOW (ref 135–145)

## 2020-06-01 LAB — EXPECTORATED SPUTUM ASSESSMENT W GRAM STAIN, RFLX TO RESP C

## 2020-06-01 LAB — TYPE AND SCREEN
ABO/RH(D): A NEG
Antibody Screen: NEGATIVE

## 2020-06-01 LAB — CORTISOL: Cortisol, Plasma: 27.7 ug/dL

## 2020-06-01 LAB — APTT: aPTT: 44 seconds — ABNORMAL HIGH (ref 24–36)

## 2020-06-01 LAB — PROTIME-INR
INR: 2.1 — ABNORMAL HIGH (ref 0.8–1.2)
Prothrombin Time: 22.9 seconds — ABNORMAL HIGH (ref 11.4–15.2)

## 2020-06-01 LAB — MAGNESIUM
Magnesium: 2.3 mg/dL (ref 1.7–2.4)
Magnesium: 2 mg/dL (ref 1.7–2.4)

## 2020-06-01 LAB — PROCALCITONIN: Procalcitonin: 2.8 ng/mL

## 2020-06-01 LAB — MRSA PCR SCREENING: MRSA by PCR: NEGATIVE

## 2020-06-01 LAB — PREALBUMIN: Prealbumin: <5 mg/dL — ABNORMAL LOW (ref 18–38)

## 2020-06-01 LAB — LACTIC ACID, PLASMA
Lactic Acid, Venous: 5.6 mmol/L (ref 0.5–1.9)
Lactic Acid, Venous: 3 mmol/L (ref 0.5–1.9)

## 2020-06-01 SURGERY — ESOPHAGOGASTRODUODENOSCOPY (EGD) WITH PROPOFOL
Anesthesia: General

## 2020-06-01 MED ORDER — POTASSIUM CHLORIDE IN NACL 40-0.9 MEQ/L-% IV SOLN
INTRAVENOUS | Status: DC
  Filled 2020-06-01: qty 1000

## 2020-06-01 MED ORDER — PHENYLEPHRINE 40 MCG/ML (10ML) SYRINGE FOR IV PUSH (FOR BLOOD PRESSURE SUPPORT)
PREFILLED_SYRINGE | INTRAVENOUS | Status: DC | PRN
  Administered 2020-06-01: 120 ug via INTRAVENOUS
  Administered 2020-06-01 (×2): 80 ug via INTRAVENOUS

## 2020-06-01 MED ORDER — SODIUM CHLORIDE 0.9 % IV SOLN
250.0000 mL | INTRAVENOUS | Status: DC
  Administered 2020-06-06 – 2020-06-07 (×2): 250 mL via INTRAVENOUS

## 2020-06-01 MED ORDER — SUCCINYLCHOLINE CHLORIDE 20 MG/ML IJ SOLN
INTRAMUSCULAR | Status: DC | PRN
  Administered 2020-06-01: 100 mg via INTRAVENOUS

## 2020-06-01 MED ORDER — POTASSIUM CHLORIDE 10 MEQ/100ML IV SOLN
INTRAVENOUS | Status: AC
  Filled 2020-06-01: qty 100

## 2020-06-01 MED ORDER — SODIUM CHLORIDE 0.9 % IV SOLN
2.0000 g | Freq: Three times a day (TID) | INTRAVENOUS | Status: AC
  Administered 2020-06-01 – 2020-06-08 (×22): 2 g via INTRAVENOUS
  Filled 2020-06-01 (×23): qty 2

## 2020-06-01 MED ORDER — GUAIFENESIN ER 600 MG PO TB12: 1200.0000 mg | ORAL_TABLET | Freq: Two times a day (BID) | ORAL | Status: DC

## 2020-06-01 MED ORDER — PROPOFOL 10 MG/ML IV BOLUS
INTRAVENOUS | Status: DC | PRN
  Administered 2020-06-01: 20 mg via INTRAVENOUS
  Administered 2020-06-01 (×2): 50 mg via INTRAVENOUS

## 2020-06-01 MED ORDER — METOPROLOL TARTRATE 5 MG/5ML IV SOLN
5.0000 mg | INTRAVENOUS | Status: DC | PRN
  Administered 2020-06-01: 5 mg via INTRAVENOUS
  Filled 2020-06-01: qty 5

## 2020-06-01 MED ORDER — DEXAMETHASONE SODIUM PHOSPHATE 10 MG/ML IJ SOLN
INTRAMUSCULAR | Status: DC | PRN
  Administered 2020-06-01: 8 mg via INTRAVENOUS

## 2020-06-01 MED ORDER — BISACODYL 10 MG RE SUPP
10.0000 mg | Freq: Every day | RECTAL | Status: DC
  Filled 2020-06-01 (×2): qty 1

## 2020-06-01 MED ORDER — ALBUMIN HUMAN 25 % IV SOLN
25.0000 g | Freq: Once | INTRAVENOUS | Status: AC
  Administered 2020-06-01: 25 g via INTRAVENOUS
  Filled 2020-06-01: qty 100

## 2020-06-01 MED ORDER — PHENYLEPHRINE HCL-NACL 10-0.9 MG/250ML-% IV SOLN
25.0000 ug/min | INTRAVENOUS | Status: DC
  Administered 2020-06-01: 25 ug/min via INTRAVENOUS
  Administered 2020-06-02: 35 ug/min via INTRAVENOUS
  Administered 2020-06-02: 30 ug/min via INTRAVENOUS
  Administered 2020-06-02: 40 ug/min via INTRAVENOUS
  Filled 2020-06-01 (×4): qty 250

## 2020-06-01 MED ORDER — VANCOMYCIN HCL 1500 MG/300ML IV SOLN
1500.0000 mg | INTRAVENOUS | Status: DC
  Filled 2020-06-01: qty 300

## 2020-06-01 MED ORDER — NOREPINEPHRINE 4 MG/250ML-% IV SOLN
0.0000 ug/min | INTRAVENOUS | Status: DC
  Administered 2020-06-01: 39 ug/min via INTRAVENOUS
  Administered 2020-06-01: 2 ug/min via INTRAVENOUS
  Filled 2020-06-01: qty 250

## 2020-06-01 MED ORDER — ALBUTEROL SULFATE (2.5 MG/3ML) 0.083% IN NEBU
2.5000 mg | INHALATION_SOLUTION | Freq: Once | RESPIRATORY_TRACT | Status: AC
  Administered 2020-06-01: 2.5 mg via RESPIRATORY_TRACT

## 2020-06-01 MED ORDER — VANCOMYCIN HCL 1500 MG/300ML IV SOLN
1500.0000 mg | Freq: Two times a day (BID) | INTRAVENOUS | Status: DC
  Administered 2020-06-02: 1500 mg via INTRAVENOUS
  Filled 2020-06-01 (×2): qty 300

## 2020-06-01 MED ORDER — ALBUTEROL SULFATE (2.5 MG/3ML) 0.083% IN NEBU
INHALATION_SOLUTION | RESPIRATORY_TRACT | Status: AC
  Filled 2020-06-01: qty 3

## 2020-06-01 MED ORDER — NOREPINEPHRINE 16 MG/250ML-% IV SOLN
0.0000 ug/min | INTRAVENOUS | Status: DC
  Administered 2020-06-01 – 2020-06-02 (×2): 40 ug/min via INTRAVENOUS
  Administered 2020-06-02: 22 ug/min via INTRAVENOUS
  Administered 2020-06-02: 40 ug/min via INTRAVENOUS
  Administered 2020-06-03: 4 ug/min via INTRAVENOUS
  Administered 2020-06-06: 5 ug/min via INTRAVENOUS
  Administered 2020-06-06: 6 ug/min via INTRAVENOUS
  Filled 2020-06-01 (×7): qty 250

## 2020-06-01 MED ORDER — PROPOFOL 500 MG/50ML IV EMUL
INTRAVENOUS | Status: DC | PRN
  Administered 2020-06-01: 150 ug/kg/min via INTRAVENOUS

## 2020-06-01 MED ORDER — SODIUM CHLORIDE 0.9 % IV SOLN: INTRAVENOUS | Status: DC

## 2020-06-01 MED ORDER — VANCOMYCIN HCL 1750 MG/350ML IV SOLN
1750.0000 mg | INTRAVENOUS | Status: AC
  Filled 2020-06-01: qty 350

## 2020-06-01 MED ORDER — LIDOCAINE 2% (20 MG/ML) 5 ML SYRINGE
INTRAMUSCULAR | Status: DC | PRN
  Administered 2020-06-01: 120 mg via INTRAVENOUS

## 2020-06-01 MED ORDER — NOREPINEPHRINE 4 MG/250ML-% IV SOLN
INTRAVENOUS | Status: AC
  Filled 2020-06-01: qty 250

## 2020-06-01 MED ORDER — POTASSIUM CHLORIDE 10 MEQ/100ML IV SOLN
10.0000 meq | INTRAVENOUS | Status: AC
  Administered 2020-06-01 (×5): 10 meq via INTRAVENOUS
  Filled 2020-06-01 (×4): qty 100

## 2020-06-01 MED ORDER — SODIUM CHLORIDE 0.9 % IV SOLN
25.0000 mg | Freq: Four times a day (QID) | INTRAVENOUS | Status: DC | PRN
  Administered 2020-06-02: 25 mg via INTRAVENOUS
  Filled 2020-06-01: qty 1

## 2020-06-01 MED ORDER — ONDANSETRON HCL 4 MG/2ML IJ SOLN
INTRAMUSCULAR | Status: DC | PRN
  Administered 2020-06-01: 4 mg via INTRAVENOUS

## 2020-06-01 MED ORDER — IPRATROPIUM BROMIDE 0.02 % IN SOLN
0.5000 mg | Freq: Four times a day (QID) | RESPIRATORY_TRACT | Status: DC
  Administered 2020-06-01 – 2020-06-03 (×9): 0.5 mg via RESPIRATORY_TRACT
  Filled 2020-06-01 (×9): qty 2.5

## 2020-06-01 MED ORDER — LORAZEPAM 2 MG/ML IJ SOLN
0.5000 mg | Freq: Once | INTRAMUSCULAR | Status: AC
  Administered 2020-06-01: 0.5 mg via INTRAVENOUS
  Filled 2020-06-01: qty 1

## 2020-06-01 MED ORDER — SODIUM CHLORIDE 0.9 % IV BOLUS
1000.0000 mL | Freq: Once | INTRAVENOUS | Status: AC
  Administered 2020-06-01: 1000 mL via INTRAVENOUS

## 2020-06-01 MED ORDER — LEVALBUTEROL HCL 0.63 MG/3ML IN NEBU
0.6300 mg | INHALATION_SOLUTION | Freq: Four times a day (QID) | RESPIRATORY_TRACT | Status: DC
  Administered 2020-06-01 – 2020-06-03 (×9): 0.63 mg via RESPIRATORY_TRACT
  Filled 2020-06-01 (×9): qty 3

## 2020-06-01 SURGICAL SUPPLY — 15 items

## 2020-06-01 NOTE — Consult Note (Signed)
Consultation Note Date: 06/01/2020   Patient Name: Jeffrey Harris  DOB: [birth year]  MRN: [id number]  Age / Sex: 64 y.o., male  PCP: [name], MD Referring Physician: Eugenie Filler, MD  Reason for Consultation: Establishing goals of care  HPI/Patient Profile: 64 y.o. male  admitted on 05/29/2020   Clinical Assessment and Goals of Care: Jeffrey Harris is a 63 year old gentleman who lives at home with his wife in [place], [place].  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  Patient follows with Dr. Marin Olp from medical oncology.  Patient is admitted to hospital medicine service for severe electrolyte abnormalities such as hypokalemia hyponatremia hypochloremia deemed secondary to GI losses patient had intractable nausea vomiting and dehydration at the time of admission.  He had CT abdomen showing persistent enlargement of pancreatic head with distal CBD stent in place.  GI services are also following, upper GI series with moderate to marked partial duodenal obstruction at the level of pancreatic tumor and stent was seen.  A palliative consultation has been requested for goals of care discussions.  Jeffrey Harris is awake alert resting in bed.  He was recently changed and repositioned in bed.  I introduced myself and palliative care as follows: Palliative medicine is specialized medical care for people living with serious illness. It focuses on providing relief from the symptoms and stress of a serious illness. The goal is to improve quality of life for both the patient and the family.  Goals of care: Broad aims of medical therapy in relation to the patient's values and preferences. Our aim is to provide medical care aimed at enabling patients to achieve the goals that matter most to them, given the circumstances of their particular  medical situation and their constraints.   Patient denies any nausea vomiting currently.  Patient states he does have some hiccups.  He is n.p.o. awaiting endoscopy this morning.  Please see additional discussion/recommendations as listed below.  Thank you for the consult.  NEXT OF KIN Lives at home with wife in [place], [place].  SUMMARY OF RECOMMENDATIONS   Full code/full scope Continue current mode of care Patient going for endoscopy is n.p.o. right now Monitor hospital course, monitor overall disease trajectory to help guide further goals of care discussions.  Medical oncology, GI following.  Palliative services will also follow along for supportive care.  Code Status/Advance Care Planning:  Full code    Symptom Management:   Medication history noted.  Patient is on Compazine and Reglan.  Monitor as needed medication use.  Palliative Prophylaxis:   Delirium Protocol  Additional Recommendations (Limitations, Scope, Preferences):  Full Scope Treatment  Psycho-social/Spiritual:   Desire for further Chaplaincy support:yes  Additional Recommendations: Caregiving  Support/Resources  Prognosis:   Unable to determine  Discharge Planning: To Be Determined      Primary Diagnoses: Present on Admission: . Intractable nausea and vomiting . Dehydration with hyponatremia . Hypokalemia due to excessive gastrointestinal loss of potassium .  Pancreatic cancer metastasized to liver ([place]) . Malignant ascites . Prolonged QT interval . Hypotension   I have reviewed the medical record, interviewed the patient and family, and examined the patient. The following aspects are pertinent.  Past Medical History:  Diagnosis Date  . Goals of care, counseling/discussion 07/29/2019  . Gout   . Hyperbilirubinemia 07/2019  . Pancreatic cancer metastasized to liver [facility]) 07/29/2019   Social History   Socioeconomic History  . Marital status: Married    Spouse name: Not on  file  . Number of children: Not on file  . Years of education: Not on file  . Highest education level: Not on file  Occupational History  . Not on file  Tobacco Use  . Smoking status: Never Smoker  . Smokeless tobacco: Former Systems developer    Types: Snuff  Vaping Use  . Vaping Use: Never used  Substance and Sexual Activity  . Alcohol use: No  . Drug use: No  . Sexual activity: Not Currently  Other Topics Concern  . Not on file  Social History Narrative  . Not on file   Social Determinants of Health   Financial Resource Strain:   . Difficulty of Paying Living Expenses: Not on file  Food Insecurity:   . Worried About Charity fundraiser in the Last Year: Not on file  . Ran Out of Food in the Last Year: Not on file  Transportation Needs:   . Lack of Transportation (Medical): Not on file  . Lack of Transportation (Non-Medical): Not on file  Physical Activity:   . Days of Exercise per Week: Not on file  . Minutes of Exercise per Session: Not on file  Stress:   . Feeling of Stress : Not on file  Social Connections:   . Frequency of Communication with Friends and Family: Not on file  . Frequency of Social Gatherings with Friends and Family: Not on file  . Attends Religious Services: Not on file  . Active Member of Clubs or Organizations: Not on file  . Attends Archivist Meetings: Not on file  . Marital Status: Not on file   Family History  Problem Relation Age of Onset  . Diabetes Father    Scheduled Meds: . bisacodyl  10 mg Rectal Daily  . Chlorhexidine Gluconate Cloth  6 each Topical Daily  . enoxaparin (LOVENOX) injection  40 mg Subcutaneous Q24H  . gabapentin  300 mg Oral TID  . lipase/protease/amylase  72,000 Units Oral TID AC  . OLANZapine  10 mg Oral Daily  . pantoprazole (PROTONIX) IV  40 mg Intravenous Q12H  . sodium chloride flush  10-40 mL Intracatheter Q12H  . tamsulosin  0.4 mg Oral Daily   Continuous Infusions: . sodium chloride 20 mL/hr at  05/31/20 2300  . metoCLOPramide (REGLAN) injection 20 mg (06/01/20 0917)  . potassium chloride 10 mEq (06/01/20 0914)   PRN Meds:.acetaminophen **OR** acetaminophen, alum & mag hydroxide-simeth, oxyCODONE-acetaminophen, prochlorperazine, sodium chloride flush Medications Prior to Admission:  Prior to Admission medications   Medication Sig Start Date End Date Taking? Authorizing Provider  diphenoxylate-atropine (LOMOTIL) 2.5-0.025 MG tablet Take 1-2 tablets by mouth 4 (four) times daily as needed for diarrhea or loose stools. 03/09/20  Yes Volanda Napoleon, MD  gabapentin (NEURONTIN) 300 MG capsule Take 1 capsule (300 mg total) by mouth 3 (three) times daily. 05/10/20  Yes Ennever, Rudell Cobb, MD  hydrocortisone (ANUSOL-HC) 25 MG suppository PLACE 1 SUPPOSITORY (25 MG TOTAL) RECTALLY 2 (TWO)  TIMES DAILY. FOR 7 DAYS 12/16/19  Yes Volanda Napoleon, MD  KLOR-CON M20 20 MEQ tablet TAKE 2 TABLETS BY MOUTH DAILY 04/23/20  Yes Volanda Napoleon, MD  lactulose [facility]) 10 GM/15ML solution SMARTSIG:40 Milliliter(s) By Mouth Morning-Evening 05/10/20  Yes Ennever, Rudell Cobb, MD  lipase/protease/amylase (CREON) 36000 UNITS CPEP capsule Take 2 capsules (72,000 Units total) by mouth 3 (three) times daily before meals. 09/14/19  Yes Ennever, Rudell Cobb, MD  ondansetron (ZOFRAN) 8 MG tablet Take 1 tablet (8 mg total) by mouth every 8 (eight) hours as needed for nausea or vomiting. 05/17/20  Yes Ennever, Rudell Cobb, MD  oxyCODONE-acetaminophen (PERCOCET/ROXICET) 5-325 MG tablet Take 1 tablet by mouth every 6 (six) hours as needed for severe pain.   Yes [provider]  pantoprazole (PROTONIX) 40 MG tablet TAKE 1 TABLET BY MOUTH TWICE A DAY 01/15/20  Yes Ennever, Rudell Cobb, MD  tamsulosin (FLOMAX) 0.4 MG CAPS capsule Take 0.4 mg by mouth daily. 12/24/19  Yes [provider]  zolpidem (AMBIEN) 5 MG tablet Take 1 tablet (5 mg total) by mouth at bedtime as needed for sleep. 08/17/19  Yes Volanda Napoleon, MD  allopurinol  (ZYLOPRIM) 300 MG tablet Take 300 mg by mouth daily. Patient not taking: Reported on 05/30/2020 03/28/20   [provider]  linaclotide Rolan Lipa) 145 MCG CAPS capsule Take 1 capsule (145 mcg total) by mouth daily before breakfast. 12/22/19   Volanda Napoleon, MD  prochlorperazine (COMPAZINE) 10 MG tablet Take 1 tablet (10 mg total) by mouth every 6 (six) hours as needed (Nausea or vomiting). 08/07/19 04/13/20  Volanda Napoleon, MD   No Known Allergies Review of Systems Complains of mild hiccups Physical Exam Awake alert resting in bed Appears pale Appears with generalized weakness Does not have edema Wishes to rest prior to his procedure, does not engage much at this time. Oriented, mood congruent No edema  Vital Signs: BP 104/74 (BP Location: Right Arm)   Pulse 84   Temp 98.7 F (37.1 C) (Oral)   Resp 18   Ht 5\' 11"  (1.803 m)   Wt 78 kg   SpO2 93%   BMI 23.98 kg/m  Pain Scale: 0-10   Pain Score: 0-No pain   SpO2: SpO2: 93 % O2 Device:SpO2: 93 % O2 Flow Rate: .O2 Flow Rate (L/min): 2 L/min  IO: Intake/output summary:   Intake/Output Summary (Last 24 hours) at 06/01/2020 0955 Last data filed at 05/31/2020 2300 Gross per 24 hour  Intake 1115.64 ml  Output --  Net 1115.64 ml    LBM: Last BM Date: 05/31/20 Baseline Weight: Weight: 78 kg Most recent weight: Weight: 78 kg     Palliative Assessment/Data:   PPS 40%  Time In:  8.30 Time Out:  9.30 Time Total:   60  Greater than 50%  of this time was spent counseling and coordinating care related to the above assessment and plan.  Signed by: Loistine Chance, MD   Please contact Palliative Medicine Team phone at [phone] for questions and concerns.  For individual provider: See Shea Evans

## 2020-06-01 NOTE — Anesthesia Postprocedure Evaluation (Signed)
Anesthesia Post Note  Patient: Jeffrey Harris  Procedure(s) Performed: ESOPHAGOGASTRODUODENOSCOPY (EGD) WITH PROPOFOL (N/A )     Patient location during evaluation: Endoscopy Anesthesia Type: General Level of consciousness: awake and alert, patient cooperative and oriented Pain management: pain level controlled Vital Signs Assessment: post-procedure vital signs reviewed and stable Respiratory status: spontaneous breathing, nonlabored ventilation, respiratory function stable and patient connected to nasal cannula oxygen Cardiovascular status: blood pressure returned to baseline and stable Postop Assessment: no apparent nausea or vomiting Anesthetic complications: no Comments: Pt still requiring [place] O2, responded to Albuterol neb. Lungs remain clear to ausc. Discussed with hospitalist, who agrees with supplemental O2 while patient awaits surgical consult.    No complications documented.  Last Vitals:  Vitals:   06/01/20 1303 06/01/20 1308  BP:    Pulse: 93 93  Resp: 18 16  Temp:    SpO2: 93% 94%    Last Pain:  Vitals:   06/01/20 1238  TempSrc: Axillary  PainSc: 0-No pain                 Oliverio Cho,E. Geovannie Vilar

## 2020-06-01 NOTE — Progress Notes (Signed)
   06/01/20 1640  Assess: MEWS Score  Temp 98.1 F (36.7 C)  BP (!) 57/45  Pulse Rate (!) 119  Resp (!) 38  Level of Consciousness Responds to Voice  SpO2 90 %  O2 Device Nasal Cannula  O2 Flow Rate (L/min) 3 L/min  Assess: MEWS Score  MEWS Temp 0  MEWS Systolic 3  MEWS Pulse 2  MEWS RR 3  MEWS LOC 1  MEWS Score 9  MEWS Score Color Red  Assess: if the MEWS score is Yellow or Red  Were vital signs taken at a resting state? Yes  Focused Assessment Change from prior assessment (see assessment flowsheet)  Early Detection of Sepsis Score *See Row Information* Low  MEWS guidelines implemented *See Row Information* Yes  Treat  MEWS Interventions Administered scheduled meds/treatments (bolus)  Pain Scale 0-10  Pain Score 0  Take Vital Signs  Increase Vital Sign Frequency  Red: Q 1hr X 4 then Q 4hr X 4, if remains red, continue Q 4hrs  Escalate  MEWS: Escalate Red: discuss with charge nurse/RN and provider, consider discussing with RRT  Notify: Charge Nurse/RN  Name of Charge Nurse/RN Notified Asencion Islam, RN  Date Charge Nurse/RN Notified 06/01/20  Time Charge Nurse/RN Notified 1642  Notify: Provider  Provider Name/Title Dr. Grandville Silos  Date Provider Notified 06/01/20  Time Provider Notified 1642  Notification Type Face-to-face  Notification Reason Change in status  Response See new orders  Date of Provider Response 06/01/20  Time of Provider Response 1642  Notify: Rapid Response  Name of Rapid Response RN Notified Christian, RN  Date Rapid Response Notified 06/01/20  Time Rapid Response Notified 0175  Document  Patient Outcome Transferred/level of care increased  Progress note created (see row info) Yes

## 2020-06-01 NOTE — Consult Note (Signed)
NAME:  Jeffrey Harris, MRN:  [id number], DOB:  1955-11-10, LOS: 2 ADMISSION DATE:  05/29/2020, CONSULTATION DATE:  06/01/2020 REFERRING MD:  Dr Grandville Silos, CHIEF COMPLAINT:  shock   Brief History   Asked to see patient for shock, unresponsive to fluids Had endoscopy today showing a large obstructing duodenal mass  History of present illness   Patient with metastatic pancreatic cancer Carcinomatosis abdominis Found to have an obstructing pancreatic mass  Diagnosed with pancreatic cancer-diagnosed November 2020, has had 1 course of chemotherapy-last chemo 05/10/2020, scheduled for next course with Dr. Drema Pry vomiting, has cough, shortness of breath Past Medical History   Past Medical History:  Diagnosis Date   Goals of care, counseling/discussion 07/29/2019   Gout    Hyperbilirubinemia 07/2019   Pancreatic cancer metastasized to liver ([place]) 07/29/2019   Significant Hospital Events   Hypotension, altered mental status S/p EGD 06/01/2020  Consults:  PCCM GI Surgery  Procedures:  EGD 06/01/2020  Significant Diagnostic Tests:  Chest x-ray with extensive consolidation left lung field  Micro Data:  None Blood culture 06/01/2020 Sputum culture 06/01/2020 Antimicrobials:  None  Interim history/subjective:  Altered mentation, hypotension, tachypnea  Objective   Blood pressure (!) 57/45, pulse (!) 119, temperature 98.1 F (36.7 C), temperature source Oral, resp. rate (!) 38, height 5\' 11"  (1.803 m), weight 78 kg, SpO2 90 %.        Intake/Output Summary (Last 24 hours) at 06/01/2020 1730 Last data filed at 06/01/2020 1400 Gross per 24 hour  Intake 2249.26 ml  Output 0 ml  Net 2249.26 ml   Filed Weights   05/31/20 0232 06/01/20 1128  Weight: 78 kg 78 kg    Examination: General: Middle-aged, pale, does not appear in extremis HENT: Moist oral mucosa Lungs: Bilateral rhonchi Cardiovascular: S1-S2 appreciated Abdomen: Soft, nontender Extremities: No  clubbing, no edema Neuro: Awake and alert, drifts off to sleep easily GU:   Resolved Hospital Problem list     Assessment & Plan:  Septic shock Aspiration pneumonia -Blood pressure support with Levophed -Give 100 cc of 25% albumin -Continue fluid resuscitation -Started on cefepime and vancomycin -Follow-up on cultures  Extensive left lung pneumonia -Previous chest x-ray did reveal some infiltrative process in the left lung -Significant progression of infiltrate -We will benefit from CT chest to further evaluate findings -Continue current antibiotics  Lactate of 5.4 -Fluid resuscitation -On normal saline at 125 cc an hour  Patient should be n.p.o.  Metastatic pancreatic cancer Obstructing duodenal mass -Recently on Abraxane and Gemzar-last chemo session 05/10/2020 -Patient of Dr. Marin Olp -Does have malignant ascites with 3 recent paracentesis  CT abdomen and pelvis is ordered Add CT scan of the chest to further evaluate extensive infiltrative process  Palliative care involved in care -Consulted for goals of care discussions  Patient is full code -Discussed with spouse   Best practice:  Diet: N.p.o. Pain/Anxiety/Delirium protocol (if indicated): Per primary VAP protocol (if indicated): Not indicated DVT prophylaxis: Lovenox GI prophylaxis: Protonix Glucose control:  Mobility: Bedrest Code Status: Full code Family Communication: Discussed with spouse Disposition: ICU  Labs   CBC: Recent Labs  Lab 05/29/20 2247 05/30/20 0614 05/31/20 0930 06/01/20 0351  WBC 9.4 6.8 7.2 9.9  NEUTROABS  --   --  5.8  --   HGB 15.2 12.0* 12.2* 11.1*  HCT 42.4 34.2* 35.7* 32.9*  MCV 93.8 96.6 98.1 98.2  PLT 260 155 142* 113*    Basic Metabolic Panel: Recent Labs  Lab 05/29/20 2247  05/30/20 0614 05/30/20 1147 05/31/20 0930 06/01/20 0351  NA 127* 126* 126* 125* 127*  K 2.4* 2.6* 3.1* 3.2* 3.0*  CL <65* 66* 72* 80* 82*  CO2 42* 44* 40* 34* 34*  GLUCOSE 118* 110*  89 95 98  BUN 19 17 17 12 8   CREATININE 1.01 0.81 0.76 0.59* 0.61  CALCIUM 8.6* 7.4* 7.2* 7.3* 7.6*  MG 2.4  --   --  2.3 2.3   GFR: Estimated Creatinine Clearance: 103.3 mL/min (by C-G formula based on SCr of 0.61 mg/dL). Recent Labs  Lab 05/29/20 2247 05/30/20 0614 05/30/20 1248 05/31/20 0930 06/01/20 0351  WBC 9.4 6.8  --  7.2 9.9  LATICACIDVEN  --   --  0.9  --   --     Liver Function Tests: Recent Labs  Lab 05/29/20 2247 05/31/20 0930  AST 39 34  ALT 20 20  ALKPHOS 182* 168*  BILITOT 1.8* 1.3*  PROT 7.3 5.6*  ALBUMIN 3.3* 2.7*   Recent Labs  Lab 05/29/20 2247  LIPASE 29   No results for input(s): AMMONIA in the last 168 hours.  ABG No results found for: PHART, PCO2ART, PO2ART, HCO3, TCO2, ACIDBASEDEF, O2SAT   Coagulation Profile: Recent Labs  Lab 06/01/20 1652  INR 2.1*    Cardiac Enzymes: No results for input(s): CKTOTAL, CKMB, CKMBINDEX, TROPONINI in the last 168 hours.  HbA1C: No results found for: HGBA1C  CBG: Recent Labs  Lab 05/29/20 2245  GLUCAP 137*    Review of Systems:   Denies any pain or discomfort  Past Medical History  He,  has a past medical history of Goals of care, counseling/discussion (07/29/2019), Gout, Hyperbilirubinemia (07/2019), and Pancreatic cancer metastasized to liver ([place]) (07/29/2019).   Surgical History    Past Surgical History:  Procedure Laterality Date   BIOPSY  07/25/2019   Procedure: BIOPSY;  Surgeon: Ronnette Juniper, MD;  [facility] ENDOSCOPY;  Service: Gastroenterology;;   ESOPHAGOGASTRODUODENOSCOPY  07/25/2019   Procedure: ESOPHAGOGASTRODUODENOSCOPY (EGD);  Surgeon: Ronnette Juniper, MD;  Location: [place];  Service: Gastroenterology;;   IR BILIARY STENT(S) EXISTING ACCESS INC DILATION CATH EXCHANGE  08/27/2019   IR CHOLANGIOGRAM EXISTING TUBE  09/01/2019   IR CONVERT BILIARY DRAIN TO INT EXT BILIARY DRAIN  08/27/2019   IR IMAGING GUIDED PORT INSERTION  08/06/2019   IR INT EXT BILIARY DRAIN  WITH CHOLANGIOGRAM  07/26/2019   IR PARACENTESIS  05/16/2020   IR RADIOLOGIST EVAL & MGMT  08/05/2019   IR REMOVAL BILIARY DRAIN  09/01/2019   KNEE ARTHROSCOPY       Social History   reports that he has never smoked. He quit smokeless tobacco use about 7 years ago.  His smokeless tobacco use included snuff. He reports that he does not drink alcohol and does not use drugs.   Family History   His family history includes Diabetes in his father.   Allergies No Known Allergies   Home Medications  Prior to Admission medications   Medication Sig Start Date End Date Taking? Authorizing Provider  diphenoxylate-atropine (LOMOTIL) 2.5-0.025 MG tablet Take 1-2 tablets by mouth 4 (four) times daily as needed for diarrhea or loose stools. 03/09/20  Yes Volanda Napoleon, MD  gabapentin (NEURONTIN) 300 MG capsule Take 1 capsule (300 mg total) by mouth 3 (three) times daily. 05/10/20  Yes Ennever, Rudell Cobb, MD  hydrocortisone (ANUSOL-HC) 25 MG suppository PLACE 1 SUPPOSITORY (25 MG TOTAL) RECTALLY 2 (TWO) TIMES DAILY. FOR 7 DAYS 12/16/19  Yes Volanda Napoleon,  MD  KLOR-CON M20 20 MEQ tablet TAKE 2 TABLETS BY MOUTH DAILY 04/23/20  Yes Volanda Napoleon, MD  lactulose [facility]) 10 GM/15ML solution SMARTSIG:40 Milliliter(s) By Mouth Morning-Evening 05/10/20  Yes Ennever, Rudell Cobb, MD  lipase/protease/amylase (CREON) 36000 UNITS CPEP capsule Take 2 capsules (72,000 Units total) by mouth 3 (three) times daily before meals. 09/14/19  Yes Ennever, Rudell Cobb, MD  ondansetron (ZOFRAN) 8 MG tablet Take 1 tablet (8 mg total) by mouth every 8 (eight) hours as needed for nausea or vomiting. 05/17/20  Yes Ennever, Rudell Cobb, MD  oxyCODONE-acetaminophen (PERCOCET/ROXICET) 5-325 MG tablet Take 1 tablet by mouth every 6 (six) hours as needed for severe pain.   Yes [provider]  pantoprazole (PROTONIX) 40 MG tablet TAKE 1 TABLET BY MOUTH TWICE A DAY 01/15/20  Yes Ennever, Rudell Cobb, MD  tamsulosin (FLOMAX) 0.4 MG CAPS capsule  Take 0.4 mg by mouth daily. 12/24/19  Yes [provider]  zolpidem (AMBIEN) 5 MG tablet Take 1 tablet (5 mg total) by mouth at bedtime as needed for sleep. 08/17/19  Yes Volanda Napoleon, MD  allopurinol (ZYLOPRIM) 300 MG tablet Take 300 mg by mouth daily. Patient not taking: Reported on 05/30/2020 03/28/20   [provider]  linaclotide Rolan Lipa) 145 MCG CAPS capsule Take 1 capsule (145 mcg total) by mouth daily before breakfast. 12/22/19   Volanda Napoleon, MD  prochlorperazine (COMPAZINE) 10 MG tablet Take 1 tablet (10 mg total) by mouth every 6 (six) hours as needed (Nausea or vomiting). 08/07/19 04/13/20  Volanda Napoleon, MD    The patient is critically ill with multiple organ systems failure and requires high complexity decision making for assessment and support, frequent evaluation and titration of therapies, application of advanced monitoring technologies and extensive interpretation of multiple databases. Critical Care Time devoted to patient care services described in this note independent of APP/resident time (if applicable)  is 30 minutes.   Sherrilyn Rist MD [facility] Personal pager: [phone] If unanswered, please page CCM On-call: [phone]

## 2020-06-01 NOTE — Anesthesia Preprocedure Evaluation (Addendum)
Anesthesia Evaluation  Patient identified by MRN, date of birth, ID band Patient awake    Reviewed: Allergy & Precautions, NPO status , Patient's Chart, lab work & pertinent test results  History of Anesthesia Complications Negative for: history of anesthetic complications  Airway Mallampati: II  TM Distance: >3 FB Neck ROM: Full    Dental  (+) Dental Advisory Given   Pulmonary  05/30/2020 SARS coronavirus NEG   breath sounds clear to auscultation       Cardiovascular negative cardio ROS   Rhythm:Regular Rate:Normal     Neuro/Psych negative neurological ROS     GI/Hepatic GERD  Medicated and Controlled,Pancreatic cancer with liver mets malignant ascites No N/V since hospitalized   Endo/Other  negative endocrine ROS  Renal/GU      Musculoskeletal   Abdominal   Peds  Hematology Hb 11.1, plt 113k   Anesthesia Other Findings   Reproductive/Obstetrics                            Anesthesia Physical Anesthesia Plan  ASA: III  Anesthesia Plan: MAC   Post-op Pain Management:    Induction:   PONV Risk Score and Plan: 1 and Treatment may vary due to age or medical condition  Airway Management Planned: Natural Airway and Nasal Cannula  Additional Equipment: None  Intra-op Plan:   Post-operative Plan:   Informed Consent: I have reviewed the patients History and Physical, chart, labs and discussed the procedure including the risks, benefits and alternatives for the proposed anesthesia with the patient or authorized representative who has indicated his/her understanding and acceptance.     Dental advisory given  Plan Discussed with: CRNA and Surgeon  Anesthesia Plan Comments:        Anesthesia Quick Evaluation

## 2020-06-01 NOTE — Progress Notes (Signed)
Pt transferred to SDU per Dr. Grandville Silos. Report given to Nydia Bouton, RN and Darrick Meigs, Therapist, sports at bedside. Verbalized understanding. Pt's wife at bedside at time of transfer. Updated on plan of care and verbalized understanding.

## 2020-06-01 NOTE — Interval H&P Note (Signed)
History and Physical Interval Note: 63/male with metastatic pancreatic cancer with an abnormal UGI series.  06/01/2020 11:39 AM  Mariam Dollar  has presented today for EGD, with the diagnosis of abnormal UGI series, partial duodenal obstruction from pancreatic cancer.  The various methods of treatment have been discussed with the patient and family. After consideration of risks, benefits and other options for treatment, the patient has consented to  Procedure(s): ESOPHAGOGASTRODUODENOSCOPY (EGD) WITH PROPOFOL (N/A) as a surgical intervention.  The patient's history has been reviewed, patient examined, no change in status, stable for surgery.  I have reviewed the patient's chart and labs.  Questions were answered to the patient's satisfaction.     Ronnette Juniper

## 2020-06-01 NOTE — Anesthesia Procedure Notes (Signed)
Procedure Name: Intubation Date/Time: 06/01/2020 12:01 PM Performed by: Deliah Boston, CRNA Pre-anesthesia Checklist: Patient identified, Emergency Drugs available, Suction available and Patient being monitored Patient Re-evaluated:Patient Re-evaluated prior to induction Oxygen Delivery Method: Circle system utilized Preoxygenation: Pre-oxygenation with 100% oxygen Induction Type: IV induction, Rapid sequence and Cricoid Pressure applied Laryngoscope Size: Mac and 4 Grade View: Grade I Tube type: Oral Tube size: 7.0 mm Number of attempts: 1 Airway Equipment and Method: Stylet and Oral airway Placement Confirmation: ETT inserted through vocal cords under direct vision,  positive ETCO2 and breath sounds checked- equal and bilateral Secured at: 22 cm Tube secured with: Tape Dental Injury: Teeth and Oropharynx as per pre-operative assessment

## 2020-06-01 NOTE — Progress Notes (Addendum)
Pharmacy Antibiotic Note  Jeffrey Harris is a 64 y.o. male admitted on 05/29/2020 with intractable nausea and vomiting.  PMH significant for pancreatic cancer with mets to liver, and malignant ascites.  Pharmacy has been consulted for Vancomycin &Cefepime dosing for possible pneumonia/productive cough & sepsis.  Plan: Cefepime 2gm IV q8h Vancomycin 1750mg  IV x 1 followed by 1500mg  IV q12h Follow culture results/sensitivities Monitor Vancomycin trough levels as needed  Height: 5\' 11"  (180.3 cm) Weight: 78 kg (171 lb 15.3 oz) IBW/kg (Calculated) : 75.3  Temp (24hrs), Avg:98.3 F (36.8 C), Min:97.6 F (36.4 C), Max:98.8 F (37.1 C)  Recent Labs  Lab 05/29/20 2247 05/30/20 0614 05/30/20 1147 05/30/20 1248 05/31/20 0930 06/01/20 0351  WBC 9.4 6.8  --   --  7.2 9.9  CREATININE 1.01 0.81 0.76  --  0.59* 0.61  LATICACIDVEN  --   --   --  0.9  --   --     Estimated Creatinine Clearance: 103.3 mL/min (by C-G formula based on SCr of 0.61 mg/dL).    No Known Allergies  Antimicrobials this admission: 9/29 Cefepime >>   9/29 Vancomycin >>  Dose adjustments this admission:     Microbiology results:  9/29 Sputum:       Thank you for allowing pharmacy to be a part of this patient's care.  Everette Rank, PharmD 06/01/2020 2:58 PM

## 2020-06-01 NOTE — Progress Notes (Signed)
   06/01/20 1441  Assess: MEWS Score  Temp 97.6 F (36.4 C)  BP (!) 137/122  ECG Heart Rate (!) 126  Resp (!) 25  Level of Consciousness Alert  SpO2 93 %  O2 Device Nasal Cannula  O2 Flow Rate (L/min) 2 L/min  Assess: MEWS Score  MEWS Temp 0  MEWS Systolic 0  MEWS Pulse 2  MEWS RR 1  MEWS LOC 0  MEWS Score 3  MEWS Score Color Yellow  Assess: if the MEWS score is Yellow or Red  Were vital signs taken at a resting state? Yes  Focused Assessment Change from prior assessment (see assessment flowsheet)  Early Detection of Sepsis Score *See Row Information* Low  MEWS guidelines implemented *See Row Information* Yes  Treat  MEWS Interventions Escalated (See documentation below)  Pain Scale 0-10  Pain Score 0  Take Vital Signs  Increase Vital Sign Frequency  Yellow: Q 2hr X 2 then Q 4hr X 2, if remains yellow, continue Q 4hrs  Escalate  MEWS: Escalate Yellow: discuss with charge nurse/RN and consider discussing with provider and RRT  Notify: Charge Nurse/RN  Name of Charge Nurse/RN Notified Asencion Islam, RN  Date Charge Nurse/RN Notified 06/01/20  Time Charge Nurse/RN Notified 1448  Notify: Provider  Provider Name/Title Dr. Grandville Silos  Date Provider Notified 06/01/20  Time Provider Notified 1448  Notification Type Face-to-face  Notification Reason Change in status  Response See new orders  Date of Provider Response 06/01/20  Time of Provider Response 1448

## 2020-06-01 NOTE — Progress Notes (Addendum)
PROGRESS NOTE    Jeffrey Harris  SAY:[id number] DOB: 12-06-1955 DOA: 05/29/2020 PCP: [name], MD    Chief Complaint  Patient presents with  . 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 Marin Olp, history of gout presented to [facility], ED with generalized weakness, intractable nausea and vomiting.  Patient reported that for the past 4 weeks he has been bouts of nausea and vomiting.  Described as nonbilious, nonbloody vomiting typically after attempting to eat solids.  Patient ported experiencing numerous episodes daily.  Also reported generalized weakness, lightheadedness upon standing up.  Denied any fevers, diarrhea, sick contacts, recent travel.  Recently diagnosed with malignant ascites, undergone paracentesis 3 times since diagnosed on September 3.  Patient last received Gemzar and Abraxane on 9/7 Patient was found to be profoundly volume depleted in ED with moderate hyponatremia, severe hypokalemia, hypochloremia and alkalosis.  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  Postprocedure patient seems to be doing fine however patient was transferred to the floor and noted to be tachycardic initially and subsequently hypotensive,  significant pallor, cough.  Concern for septic shock versus acute bleed versus possible perforation as patient underwent recent procedure with a large mass noted.  We will transfer the patient to the ICU.  Panculture, check a lactic acid, procalcitonin, check urine strep pneumococcus antigen, urine Legionella antigen, UA with cultures and sensitivities, blood cultures x2, CBC with differential, c-Met, chest x-ray was done and is currently pending.  CT abdomen and pelvis to rule out perforation.  1 L normal saline bolus.  Place on Neo-Synephrine drip.  Place on IV vancomycin, IV cefepime, Mucinex, Xopenex and Atrovent nebs.[name]  PCCM consulted who have graciously agreed to help manage the patient.  General surgery updated.  2 metastatic pancreatic cancer to the liver with recent diagnosis of malignant ascites status post paracentesis/obstructing mass Patient being followed by oncology, Dr. Marin Olp last received chemotherapy with Gemzar and Abraxane 05/10/2020.  Patient seen by gastroenterology due to concerns for nausea and vomiting, abdominal x-ray done showed no high-grade obstructive bowel gas pattern, no free air, moderate colonic stool burden with CT abdomen and pelvis done showing persistent enlargement of pancreatic head with distal CBD stent in place.  Upper GI series showed moderate to marked partial duodenal obstruction at the level of the pancreatic tumor and stent.  Patient underwent upper endoscopy earlier on today which revealed a large obstructing duodenal mass.  Gastroenterology recommended general surgery consultation for placement of feeding jejunostomy tube.  General surgery consulted and are following.  Oncology following.  Palliative care following.  3.  Severe electrolyte  abnormalities/hypokalemia/hyponatremia/hypochloremia secondary to GI losses with intractable nausea and vomiting On admission patient noted to have a potassium of 2.4, sodium of 127, chloride less than 65, CO2 of 42.  Patient  placed on  aggressive IV fluid hydration, potassium supplementation.  Serum osmolality at 264, urine osmolality of 682, urine sodium of 144 concern for possible SIADH component due to malignancy.  Abdominal films done with no high-grade obstructive bowel gas pattern, no free air, moderate colonic stool burden.  CT abdomen showed persistent enlargement of pancreatic head with a distal CBD stent in place.  GI was consulted upper GI series showed marked partial duodenal obstruction at the level of pancreatic tumor stent.  Patient underwent upper endoscopy this morning that showed a large obstructing duodenal mass.  Patient currently n.p.o.  General surgery consulted to assess for placement of feeding jejunostomy tube.  Replete electrolytes.  Follow.  4.  Severe dehydration Resume IV fluids as patient now noted to be hypotensive.  5.  Malignant ascites Last paracentesis 05/23/2020.  We will hold off on paracentesis at this time.  6.  Prolonged QT interval EKG showed a prolonged QTC of 527 on admission.  Patient noted to have severe electrolyte abnormalities.  Repeat EKG.  Keep potassium >4, magnesium >2.  Avoid QT prolongation medications.  Follow.  7.  Constipation Placed on daily Dulcolax suppositories.  8.  Failure to thrive Secondary to metastatic pancreatic cancer.  Palliative care following.  Patient currently n.p.o. with large obstructing pancreatic mass.  General surgery consulted for placement of jejunostomy tube.  Follow.   DVT prophylaxis: scdS. Code Status: Full Family Communication: Updated patient and wife at bedside. Disposition:   Status is: Inpatient    Dispo: The patient is from: Home              Anticipated d/c is to: To be determined              Anticipated d/c date is: To be determined.              Patient currently with concerns for possible septic shock, tachycardic, metastatic pancreatic cancer with obstruction. Being transferred to the ICU. Not stable for  discharge.       Consultants:   General surgery  Palliative care: Dr. Rowe Pavy Jun 01, 2020  Gastroenterology: Dr. Collene Mares May 30, 2020  Oncology: Dr. Marin Olp May 30, 2020  Procedures:  Upper endoscopy Jun 01, 2020  CT abdomen and pelvis May 30, 2020  Chest x-ray May 30, 2020, May 31, 2020, Jun 01, 2020    Antimicrobials:   IV cefepime Jun 01, 2020  IV vancomycin Jun 01, 2020   Subjective: Patient was seen early on in the endoscopy suite at which point in time seems stable on 2 l nasal cannula with some complaints of shortness of breath at that time however speaking in full sentences. Did state he had some self-induced emesis overnight. Patient seen as was called by RN as patient noted to be hypotensive, tachycardic. Patient with complaints of shortness of breath states unchanged. Patient denies any chest pain. No abdominal pain. Patient noted to be on bedpan.  Objective: Vitals:   06/01/20 1308 06/01/20 1441 06/01/20 1535 06/01/20 1640  BP:  (!) 137/122 (!) 144/106 (!) 57/45  Pulse: 93  (!) 108 (!) 119  Resp: 16 (!) 25 (!) 31 (!) 38  Temp:  97.6 F (36.4 C) 99.9 F (37.7 C) 98.1 F (36.7 C)  TempSrc:  Oral Axillary Oral  SpO2: 94% 93% 92% 90%  Weight:      Height:        Intake/Output Summary (Last 24 hours) at 06/01/2020 1711 Last data filed at 06/01/2020 1400 Gross per 24 hour  Intake 2249.26 ml  Output 0 ml  Net 2249.26 ml   Filed Weights   05/31/20 0232 06/01/20 1128  Weight: 78 kg 78 kg    Examination:  General exam: Pallor. Respiratory system: Poor air movement. Some scattered coarse breath sounds anterior lung fields. Decreased breath sounds in the bases. No wheezing.  Cardiovascular system: Tachycardia. No JVD, no murmurs rubs or gallops. No lower extremity edema. Gastrointestinal system: Abdomen is nondistended, soft and nontender. No organomegaly or masses felt. Normal bowel sounds heard. Central nervous system: Alert and oriented. No focal  neurological deficits. Extremities: Symmetric 5 x 5 power. Skin: No rashes, lesions or ulcers Psychiatry: Judgement and insight appear normal. Mood & affect appropriate.     Data Reviewed: I have personally reviewed following labs and imaging studies  CBC: Recent Labs  Lab 05/29/20 2247 05/30/20 0614 05/31/20 0930 06/01/20 0351  WBC 9.4 6.8 7.2 9.9  NEUTROABS  --   --  5.8  --   HGB 15.2 12.0* 12.2* 11.1*  HCT 42.4 34.2* 35.7* 32.9*  MCV 93.8 96.6 98.1 98.2  PLT 260 155 142* 113*    Basic Metabolic Panel: Recent Labs  Lab 05/29/20 2247 05/30/20 0614 05/30/20 1147 05/31/20 0930 06/01/20 0351  NA 127* 126* 126* 125* 127*  K 2.4* 2.6* 3.1* 3.2* 3.0*  CL <65* 66* 72* 80* 82*  CO2 42* 44* 40* 34* 34*  GLUCOSE 118* 110* 89 95 98  BUN _0 CREATININE 1.01 0.81 0.76 0.59* 0.61  CALCIUM 8.6* 7.4* 7.2* 7.3* 7.6*  MG 2.4  --   --  2.3 2.3    GFR: Estimated Creatinine Clearance: 103.3 mL/min (by C-G formula based on SCr of 0.61 mg/dL).  Liver Function Tests: Recent Labs  Lab 05/29/20 2247 05/31/20 0930  AST 39 34  ALT 20 20  ALKPHOS 182* 168*  BILITOT 1.8* 1.3*  PROT 7.3 5.6*  ALBUMIN 3.3* 2.7*    CBG: Recent Labs  Lab 05/29/20 2245  GLUCAP 137*     Recent Results (from the past 240 hour(s))  Respiratory Panel by RT PCR (Flu A&B, Covid) - Nasopharyngeal Swab     Status: None   Collection Time: 05/30/20  1:40 AM   Specimen: Nasopharyngeal Swab  Result Value Ref Range Status   SARS Coronavirus 2 by RT PCR NEGATIVE NEGATIVE Final    Comment: (NOTE) SARS-CoV-2 target nucleic acids are NOT DETECTED.  The SARS-CoV-2 RNA is generally detectable in upper respiratoy specimens during the acute phase of infection. The lowest concentration of SARS-CoV-2 viral copies this assay can detect is 131 copies/mL. A negative result does not preclude SARS-Cov-2 infection and should not be used as the sole basis for treatment or other patient management  decisions. A negative result may occur with  improper specimen collection/handling, submission of specimen other than nasopharyngeal swab, presence of viral mutation(s) within the areas targeted by this assay, and inadequate number of viral copies (<131 copies/mL). A negative result must be combined with clinical observations, patient history, and epidemiological information. The expected result is Negative.  Fact Sheet for Patients:  PinkCheek.be  Fact Sheet for Healthcare Providers:  GravelBags.it  This test is no t yet approved or cleared by the [place] FDA and  has been authorized for detection and/or diagnosis of SARS-CoV-2 by FDA under an Emergency Use Authorization (EUA). This EUA will remain  in effect (meaning this test can be used) for the duration of the COVID-19 declaration under Section 564(b)(1)  of the Act, 21 U.S.C. section 360bbb-3(b)(1), unless the authorization is terminated or revoked sooner.     Influenza A by PCR NEGATIVE NEGATIVE Final   Influenza B by PCR NEGATIVE NEGATIVE Final    Comment: (NOTE) The Xpert Xpress SARS-CoV-2/FLU/RSV assay is intended as an aid in  the diagnosis of influenza from Nasopharyngeal swab specimens and  should not be used as a sole basis for treatment. Nasal washings and  aspirates are unacceptable for Xpert Xpress SARS-CoV-2/FLU/RSV  testing.  Fact Sheet for Patients: PinkCheek.be  Fact Sheet for Healthcare Providers: GravelBags.it  This test is not yet approved or cleared by the [place] FDA and  has been authorized for detection and/or diagnosis of SARS-CoV-2 by  FDA under an Emergency Use Authorization (EUA). This EUA will remain  in effect (meaning this test can be used) for the duration of the  Covid-19 declaration under Section 564(b)(1) of the Act, 21  U.S.C. section 360bbb-3(b)(1), unless the  authorization is  terminated or revoked. Performed at [facility], [place] [address], [place], [place] [address]          Radiology Studies: DG CHEST PORT 1 VIEW  Result Date: 06/01/2020 CLINICAL DATA:  Shortness of breath status post EGD EXAM: PORTABLE CHEST 1 VIEW COMPARISON:  05/30/2020, CT 05/03/2020 FINDINGS: Right-sided central venous port tip over the SVC. The right lung is grossly clear. Interim development of fairly extensive consolidation and ground-glass opacity throughout the left thorax. No pleural effusion. Stable cardiomediastinal silhouette. No pneumothorax. IMPRESSION: Development of fairly extensive consolidation and ground-glass opacity throughout the left thorax, findings could be secondary to asymmetric edema, pulmonary hemorrhage, or diffuse pneumonia. Correlation with CT could be considered. Electronically Signed   By: Donavan Foil M.D.   On: 06/01/2020 16:57   DG UGI W SINGLE CM (SOL OR THIN BA)  Result Date: 05/31/2020 CLINICAL DATA:  Nausea vomiting, history of pancreatic neoplasm EXAM: WATER SOLUBLE UPPER GI SERIES TECHNIQUE: Single-column upper GI series was performed using water soluble contrast. CONTRAST:  158m OMNIPAQUE IOHEXOL 300 MG/ML  SOLN COMPARISON:  CT abdomen and pelvis from 05/30/2020 FLUOROSCOPY TIME:  Fluoroscopy Time:  1 minutes 35 seconds Radiation Exposure Index (if provided by the fluoroscopic device): 16.6 mGy Number of Acquired Spot Images: 1 FINDINGS: Scout image demonstrates a biliary stent in place over the RIGHT upper quadrant projecting to the RIGHT of the spine. With the head of the table slightly elevated swallowing of water-soluble contrast was performed in bilateral oblique positioning. The esophagus was found to be patulous, limited assessment given the use of water-soluble contrast. Contrast flowed into a markedly distended stomach and was immediately diluted with the luminal contents. Gastric folds appear thickened for  the degree of distension though not well evaluated on water-soluble assessment. Transition from esophagus to stomach was grossly normal aside from the esophageal dilation and the presence of reflux which was demonstrated from the stomach into the esophagus during the exam. The patient was then placed into the RIGHT lateral position and the RAO position. Only a small amount of contrast could be seen traversing the area of the stent and the area of the pancreatic and duodenal groove. Dilation of the duodenum proximal to this area was noted. Patient was LEFT in this position and progressive distension of the duodenal bulb and proximal duodenum, proximal to the stent was identified with small amounts of contrast passing intermittently across the site of presumed duodenal narrowing. The patient was then placed back into the supine  position and gastric dilation and reflux again documented with some persistent contrast first and second portion of the duodenum leading up to the site of partial obstruction. IMPRESSION: 1. Moderate-to-marked partial duodenal obstruction at the level of the pancreatic tumor and stent as described. Only a small amount of contrast passed beyond this area during the examination in the RIGHT lateral position and RAO position. 2. This results in marked gastric distension which is exhibited on the current study and in gastroesophageal reflux. Electronically Signed   By: Zetta Bills M.D.   On: 05/31/2020 09:31        Scheduled Meds: . bisacodyl  10 mg Rectal Daily  . Chlorhexidine Gluconate Cloth  6 each Topical Daily  . enoxaparin (LOVENOX) injection  40 mg Subcutaneous Q24H  . gabapentin  300 mg Oral TID  . lipase/protease/amylase  72,000 Units Oral TID AC  . OLANZapine  10 mg Oral Daily  . pantoprazole (PROTONIX) IV  40 mg Intravenous Q12H  . sodium chloride flush  10-40 mL Intracatheter Q12H  . tamsulosin  0.4 mg Oral Daily   Continuous Infusions: . sodium chloride    .  sodium chloride    . 0.9 % NaCl with KCl 40 mEq / L 100 mL/hr at 06/01/20 1424  . ceFEPime (MAXIPIME) IV 2 g (06/01/20 1529)  . chlorproMAZINE (THORAZINE) IV    . metoCLOPramide (REGLAN) injection 20 mg (06/01/20 1439)  . phenylephrine (NEO-SYNEPHRINE) Adult infusion    . [COMPLETED] potassium chloride 10 mEq (06/01/20 1300)  . [START ON 06/02/2020] vancomycin    . vancomycin       LOS: 2 days    Time spent: 60 min    Irine Seal, MD [facility]   To contact the attending provider between 7A-7P or the covering provider during after hours 7P-7A, please log into the web site [email or web address] and access using universal [place] password for that web site. If you do not have the password, please call the hospital operator.  06/01/2020, 5:11 PM

## 2020-06-01 NOTE — Op Note (Signed)
[facility] Patient Name: Jeffrey Harris Procedure Date: 06/01/2020 MRN: [id number] Attending MD: Ronnette Juniper , MD Date of Birth: Sep 27, 1955 CSN: [id number] Age: 64 Admit Type: Inpatient Procedure:                Upper GI endoscopy Indications:              Abnormal UGI series, Nausea with vomiting,                            metastatic pancreatic cancer Providers:                Ronnette Juniper, MD, Carmie End, RN, Erenest Rasher,                            RN, William Dalton, Technician Referring MD:             [facility] Medicines:                Monitored Anesthesia Care Complications:            No immediate complications. Estimated blood loss:                            Minimal. Estimated Blood Loss:     Estimated blood loss was minimal. Procedure:                Pre-Anesthesia Assessment:                           - Prior to the procedure, a History and Physical                            was performed, and patient medications and                            allergies were reviewed. The patient's tolerance of                            previous anesthesia was also reviewed. The risks                            and benefits of the procedure and the sedation                            options and risks were discussed with the patient.                            All questions were answered, and informed consent                            was obtained. Prior Anticoagulants: The patient has                            taken no previous anticoagulant or antiplatelet                            agents.  ASA Grade Assessment: III - A patient with                            severe systemic disease. After reviewing the risks                            and benefits, the patient was deemed in                            satisfactory condition to undergo the procedure.                           After obtaining informed consent, the endoscope was                             passed under direct vision. Throughout the                            procedure, the patient's blood pressure, pulse, and                            oxygen saturations were monitored continuously. The                            GIF-H190 ([id number]) Olympus gastroscope was                            introduced through the mouth, and advanced to the                            duodenal bulb. The upper GI endoscopy was                            accomplished without difficulty. The patient                            tolerated the procedure well.                           There was a large amount of fluid noted in the                            stomach and patient was noted to be vomiting                            gastric contents. At this point, the scope was                            withdrawn and the patient was intubated for the                            procedure. Scope In: Scope Out: Findings:      Fluid was found in the middle third of the esophagus and in the lower  third of the esophagus. There was evidence of stasis related esophagitis.      Retained fluid was found in the cardia, in the gastric fundus and in the       gastric body. Over 1100 ml of fluid was suctioned through the scope.      After patient was intubated, we used a biovac to suction another 600 ml       of liquid and semiliquid retained fluid.      Diffuse mildly erythematous mucosa without bleeding was found in the       cardia, in the gastric fundus, in the gastric body, in the gastric       antrum, in the prepyloric region of the stomach and at the pylorus.      A large submucosal mass with no bleeding was found in the first portion       of the duodenum.      Despite several attempts the scope could not be advanced beyond the area       of the mass.      The duodenal lumen could not be visualized due to compression from the       large mass. Impression:               - Fluid in the middle third of the  esophagus and in                            the lower third of the esophagus.                           - Retained gastric fluid.                           - Erythematous mucosa in the cardia, gastric                            fundus, gastric body, antrum, prepyloric region of                            the stomach and pylorus.                           - Malignant duodenal mass.                           - No specimens collected. Moderate Sedation:      Patient did not receive moderate sedation for this procedure, but       instead received monitored anesthesia care. Recommendation:           - NPO.                           - Refer to a surgeon today consideration for                            feeding jejunostomy.                           - Consider palliative consult. 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 All rights reserved. The codes documented in this report are preliminary and upon coder review may  be revised to meet current compliance requirements. Ronnette Juniper, MD 06/01/2020 12:32:31 PM This report has been signed electronically. Number of Addenda: 0

## 2020-06-01 NOTE — Consult Note (Signed)
[facility] Consult Note  Jeffrey Harris 1956-01-30  [id number].    Requesting MD: Therisa Doyne Chief Complaint/Reason for Consult: Obstructing duodenal mass  HPI:  Patient is a 64 year old male who presented to [facility] with 4 week history of frequent nausea and vomiting. This occurs after attempting to eat solid food and has been occurring daily. Emesis is NBNB. Associated weakness and lightheadedness on standing. 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 Patient diagnosed with pancreatic cancer in November of 2020 and started on chemotherapy December of 2020. Last chemo treatment was 05/10/2020. Patient reports he is vomiting every time he has any PO intake and also reports cough and SOB. He is still having BMs every couple of days. He denies abdominal pain. He has not had any past abdominal surgery. He was otherwise healthy prior to cancer diagnosis. Patient denies blood thinning medications. He lives at home with his wife and remains very independent.   Patient underwent upper endoscopy today which revealed large obstructing duodenal mass. General surgery consulted for consideration of placement of feeding jejunostomy.   ROS: Review of Systems  Constitutional: Positive for malaise/fatigue. Negative for chills and fever.  Respiratory: Positive for cough, shortness of breath and wheezing.   Cardiovascular: Negative for chest pain and palpitations.  Gastrointestinal: Positive for nausea and vomiting. Negative for abdominal pain.  Neurological: Positive for dizziness and weakness.  All other systems reviewed and are negative.   Family History  Problem Relation Age of Onset  . Diabetes Father     Past Medical History:  Diagnosis Date  . Goals of care, counseling/discussion 07/29/2019  . Gout   . Hyperbilirubinemia 07/2019  . Pancreatic cancer metastasized to liver [facility]) 07/29/2019    Past Surgical History:  Procedure Laterality Date  . BIOPSY   07/25/2019   Procedure: BIOPSY;  Surgeon: Ronnette Juniper, MD;  Location: [facility] ENDOSCOPY;  Service: Gastroenterology;;  . ESOPHAGOGASTRODUODENOSCOPY  07/25/2019   Procedure: ESOPHAGOGASTRODUODENOSCOPY (EGD);  Surgeon: Ronnette Juniper, MD;  Location: [place];  Service: Gastroenterology;;  . IR BILIARY STENT(S) EXISTING ACCESS INC DILATION CATH EXCHANGE  08/27/2019  . IR CHOLANGIOGRAM EXISTING TUBE  09/01/2019  . IR CONVERT BILIARY DRAIN TO INT EXT BILIARY DRAIN  08/27/2019  . IR IMAGING GUIDED PORT INSERTION  08/06/2019  . IR INT EXT BILIARY DRAIN WITH CHOLANGIOGRAM  07/26/2019  . IR PARACENTESIS  05/16/2020  . IR RADIOLOGIST EVAL & MGMT  08/05/2019  . IR REMOVAL BILIARY DRAIN  09/01/2019  . KNEE ARTHROSCOPY      Social History:  reports that he has never smoked. He quit smokeless tobacco use about 7 years ago.  His smokeless tobacco use included snuff. He reports that he does not drink alcohol and does not use drugs.  Allergies: No Known Allergies  Medications Prior to Admission  Medication Sig Dispense Refill  . diphenoxylate-atropine (LOMOTIL) 2.5-0.025 MG tablet Take 1-2 tablets by mouth 4 (four) times daily as needed for diarrhea or loose stools. 100 tablet 0  . gabapentin (NEURONTIN) 300 MG capsule Take 1 capsule (300 mg total) by mouth 3 (three) times daily. 90 capsule 6  . hydrocortisone (ANUSOL-HC) 25 MG suppository PLACE 1 SUPPOSITORY (25 MG TOTAL) RECTALLY 2 (TWO) TIMES DAILY. FOR 7 DAYS 14 suppository 0  . KLOR-CON M20 20 MEQ tablet TAKE 2 TABLETS BY MOUTH DAILY 180 tablet 1  . lactulose (CHRONULAC) 10 GM/15ML solution SMARTSIG:40 Milliliter(s) By Mouth Morning-Evening 946 mL 4  . lipase/protease/amylase (CREON) 36000 UNITS CPEP capsule  Take 2 capsules (72,000 Units total) by mouth 3 (three) times daily before meals. 180 capsule 6  . ondansetron (ZOFRAN) 8 MG tablet Take 1 tablet (8 mg total) by mouth every 8 (eight) hours as needed for nausea or vomiting. 20 tablet 2  .  oxyCODONE-acetaminophen (PERCOCET/ROXICET) 5-325 MG tablet Take 1 tablet by mouth every 6 (six) hours as needed for severe pain.    . pantoprazole (PROTONIX) 40 MG tablet TAKE 1 TABLET BY MOUTH TWICE A DAY 180 tablet 1  . tamsulosin (FLOMAX) 0.4 MG CAPS capsule Take 0.4 mg by mouth daily.    [name] zolpidem (AMBIEN) 5 MG tablet Take 1 tablet (5 mg total) by mouth at bedtime as needed for sleep. 30 tablet 1  . allopurinol (ZYLOPRIM) 300 MG tablet Take 300 mg by mouth daily. (Patient not taking: Reported on 05/30/2020)    . linaclotide (LINZESS) 145 MCG CAPS capsule Take 1 capsule (145 mcg total) by mouth daily before breakfast. 30 capsule 4    Blood pressure (!) 99/59, pulse 93, temperature 98.2 F (36.8 C), temperature source Axillary, resp. rate 16, height 5\' 11"  (1.803 m), weight 78 kg, SpO2 94 %. Physical Exam:  General: pleasant, WD, ill appearing male who is laying in bed in NAD HEENT:   Sclera are anicteric.  PERRL.  Ears and nose without any masses or lesions.  Mouth is pink and moist Heart: sinus tachycardia in the 120s.  Normal s1,s2. No obvious murmurs, gallops, or rubs noted.  Palpable radial and pedal pulses bilaterally Lungs: junky sounding cough, rales in lung bases bilaterally. Patient on nasal cannula  Abd: soft, NT, ND, +BS, no palpable abdominal mass MS: all 4 extremities are symmetrical with no cyanosis, clubbing, or edema. Skin: warm and dry with no masses, lesions, or rashes Neuro: Cranial nerves 2-12 grossly intact, sensation grossly intact throughout Psych: A&Ox3 with an appropriate affect.   Results for orders placed or performed during the hospital encounter of 05/29/20 (from the past 48 hour(s))  Magnesium     Status: None   Collection Time: 05/31/20  9:30 AM  Result Value Ref Range   Magnesium 2.3 1.7 - 2.4 mg/dL    Comment: Performed at [facility], [place] [address], [place], [place] [address]  CBC WITH DIFFERENTIAL     Status: Abnormal   Collection  Time: 05/31/20  9:30 AM  Result Value Ref Range   WBC 7.2 4.0 - 10.5 K/uL   RBC 3.64 (L) 4.22 - 5.81 MIL/uL   Hemoglobin 12.2 (L) 13.0 - 17.0 g/dL   HCT 35.7 (L) 39 - 52 %   MCV 98.1 80.0 - 100.0 fL   MCH 33.5 26.0 - 34.0 pg   MCHC 34.2 30.0 - 36.0 g/dL   RDW 18.6 (H) 11.5 - 15.5 %   Platelets 142 (L) 150 - 400 K/uL   nRBC 0.0 0.0 - 0.2 %   Neutrophils Relative % 82 %   Neutro Abs 5.8 1.7 - 7.7 K/uL   Lymphocytes Relative 7 %   Lymphs Abs 0.5 (L) 0.7 - 4.0 K/uL   Monocytes Relative 10 %   Monocytes Absolute 0.7 0 - 1 K/uL   Eosinophils Relative 0 %   Eosinophils Absolute 0.0 0 - 0 K/uL   Basophils Relative 0 %   Basophils Absolute 0.0 0 - 0 K/uL   Immature Granulocytes 1 %   Abs Immature Granulocytes 0.04 0.00 - 0.07 K/uL    Comment: Performed at [facility], [place] [address], [place], [place] [address]  Comprehensive metabolic panel     Status: Abnormal   Collection Time: 05/31/20  9:30 AM  Result Value Ref Range   Sodium 125 (L) 135 - 145 mmol/L   Potassium 3.2 (L) 3.5 - 5.1 mmol/L   Chloride 80 (L) 98 - 111 mmol/L   CO2 34 (H) 22 - 32 mmol/L   Glucose, Bld 95 70 - 99 mg/dL    Comment: Glucose reference range applies only to samples taken after fasting for at least 8 hours.   BUN 12 8 - 23 mg/dL   Creatinine, Ser 0.59 (L) 0.61 - 1.24 mg/dL   Calcium 7.3 (L) 8.9 - 10.3 mg/dL   Total Protein 5.6 (L) 6.5 - 8.1 g/dL   Albumin 2.7 (L) 3.5 - 5.0 g/dL   AST 34 15 - 41 U/L   ALT 20 0 - 44 U/L   Alkaline Phosphatase 168 (H) 38 - 126 U/L   Total Bilirubin 1.3 (H) 0.3 - 1.2 mg/dL   GFR calc non Af Amer >60 >60 mL/min   GFR calc Af Amer >60 >60 mL/min   Anion gap 11 5 - 15    Comment: Performed at [facility], [place] [address], [place], [place] [address]  CBC     Status: Abnormal   Collection Time: 06/01/20  3:51 AM  Result Value Ref Range   WBC 9.9 4.0 - 10.5 K/uL   RBC 3.35 (L) 4.22 - 5.81 MIL/uL   Hemoglobin 11.1 (L) 13.0 - 17.0 g/dL    HCT 32.9 (L) 39 - 52 %   MCV 98.2 80.0 - 100.0 fL   MCH 33.1 26.0 - 34.0 pg   MCHC 33.7 30.0 - 36.0 g/dL   RDW 18.7 (H) 11.5 - 15.5 %   Platelets 113 (L) 150 - 400 K/uL    Comment: REPEATED TO VERIFY PLATELET COUNT CONFIRMED BY SMEAR SPECIMEN CHECKED FOR CLOTS Immature Platelet Fraction may be clinically indicated, consider ordering this additional test [id number]    nRBC 0.0 0.0 - 0.2 %    Comment: Performed at [facility], [place] [address], [place], [place] [address]  Basic metabolic panel     Status: Abnormal   Collection Time: 06/01/20  3:51 AM  Result Value Ref Range   Sodium 127 (L) 135 - 145 mmol/L   Potassium 3.0 (L) 3.5 - 5.1 mmol/L   Chloride 82 (L) 98 - 111 mmol/L   CO2 34 (H) 22 - 32 mmol/L   Glucose, Bld 98 70 - 99 mg/dL    Comment: Glucose reference range applies only to samples taken after fasting for at least 8 hours.   BUN 8 8 - 23 mg/dL   Creatinine, Ser 0.61 0.61 - 1.24 mg/dL   Calcium 7.6 (L) 8.9 - 10.3 mg/dL   GFR calc non Af Amer >60 >60 mL/min   GFR calc Af Amer >60 >60 mL/min   Anion gap 11 5 - 15    Comment: Performed at [facility], [place] [address] [place], [place] [address]  Magnesium     Status: None   Collection Time: 06/01/20  3:51 AM  Result Value Ref Range   Magnesium 2.3 1.7 - 2.4 mg/dL    Comment: Performed at [facility], [place] [address], [place], [place] [address]   CT ABDOMEN PELVIS WO CONTRAST  Result Date: 05/30/2020 CLINICAL DATA:  Nausea and vomiting. History of pancreatic cancer. EXAM:  CT ABDOMEN AND PELVIS WITHOUT CONTRAST TECHNIQUE: Multidetector CT imaging of the abdomen and pelvis was performed following the standard protocol without IV contrast. COMPARISON:  May 03, 2020 FINDINGS: Lower chest: Mild atelectasis and/or infiltrate is seen along the inferior aspect of the left upper lobe. Hepatobiliary: There is a mild amount of perihepatic fluid. This is decreased in severity  when compared to the prior exam. A few tiny foci of air are seen within the right lobe of the liver (axial CT images 16 through 19, CT series number 2). This is decreased in severity when compared to the prior study. A tiny focus of air is also seen within the lumen of a mildly distended gallbladder. This is also decreased in severity. No gallstones, gallbladder wall thickening, or biliary dilatation. Pancreas: A common bile duct stent is seen within the head of the pancreas. Persistent enlargement of the pancreatic head is seen. It should be noted that this area, and surrounding lymph nodes, are limited in evaluation in the absence of intravenous contrast. Spleen: A mild amount of perisplenic fluid is seen. Spleen is otherwise normal in appearance. Adrenals/Urinary Tract: The right adrenal gland is normal in appearance. Stable 2.1 cm x 1.5 cm and 1.3 cm x 1.0 cm low-attenuation left adrenal masses are seen. 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 Kidneys are normal in size, without renal calculi, focal lesion, or hydronephrosis. The urinary bladder is partially contracted and otherwise normal in appearance. Stomach/Bowel: Stomach is within normal limits. Appendix appears normal. No evidence of bowel wall thickening, distention, or inflammatory changes. Vascular/Lymphatic: There is moderate severity calcification of the abdominal aorta and bilateral common iliac arteries, without evidence of aneurysmal dilatation. No enlarged abdominal or pelvic lymph nodes. Reproductive: Prostate is unremarkable. Other: No abdominal wall hernia or abnormality. Mild diffuse nonspecific mesenteric inflammatory fat stranding is seen throughout the abdomen. A mild to moderate amount of pelvic free fluid is seen. This is stable in appearance. Musculoskeletal: No acute or significant osseous findings. IMPRESSION: 1. Persistent enlargement of the pancreatic head with a distal common bile duct stent in place. 2. Interval decrease in severity of  tiny foci of air within the right lobe of the liver and gallbladder. 3. Mild amount of perihepatic and perisplenic fluid, decreased in severity when compared to the prior exam. 4. Stable low-attenuation left adrenal masses which may represent adrenal adenomas. 5. Mild to moderate amount of pelvic free fluid, stable. 6. Mild left upper lobe atelectasis and/or infiltrate. 7. Diffuse mesenteric inflammatory fat stranding secondary to diffuse mesenteritis cannot be excluded. 8. Aortic atherosclerosis. Aortic Atherosclerosis (ICD10-I70.0). Electronically Signed   By: Virgina Norfolk M.D.   On: 05/30/2020 16:07   DG UGI W SINGLE CM (SOL OR THIN BA)  Result Date: 05/31/2020 CLINICAL DATA:  Nausea vomiting, history of pancreatic neoplasm EXAM: WATER SOLUBLE UPPER GI SERIES TECHNIQUE: Single-column upper GI series was performed using water soluble contrast. CONTRAST:  113mL OMNIPAQUE IOHEXOL 300 MG/ML  SOLN COMPARISON:  CT abdomen and pelvis from 05/30/2020 FLUOROSCOPY TIME:  Fluoroscopy Time:  1 minutes 35 seconds Radiation Exposure Index (if provided by the fluoroscopic device): 16.6 mGy Number of Acquired Spot Images: 1 FINDINGS: Scout image demonstrates a biliary stent in place over the RIGHT upper quadrant projecting to the RIGHT of the spine. With the head of the table slightly elevated swallowing of water-soluble contrast was performed in bilateral oblique positioning. The esophagus was found to be patulous, limited assessment given the use of water-soluble contrast. Contrast flowed into a  markedly distended stomach and was immediately diluted with the luminal contents. Gastric folds appear thickened for the degree of distension though not well evaluated on water-soluble assessment. Transition from esophagus to stomach was grossly normal aside from the esophageal dilation and the presence of reflux which was demonstrated from the stomach into the esophagus during the exam. The patient was then placed into the  RIGHT lateral position and the RAO position. Only a small amount of contrast could be seen traversing the area of the stent and the area of the pancreatic and duodenal groove. Dilation of the duodenum proximal to this area was noted. Patient was LEFT in this position and progressive distension of the duodenal bulb and proximal duodenum, proximal to the stent was identified with small amounts of contrast passing intermittently across the site of presumed duodenal narrowing. The patient was then placed back into the supine position and gastric dilation and reflux again documented with some persistent contrast first and second portion of the duodenum leading up to the site of partial obstruction. IMPRESSION: 1. Moderate-to-marked partial duodenal obstruction at the level of the pancreatic tumor and stent as described. Only a small amount of contrast passed beyond this area during the examination in the RIGHT lateral position and RAO position. 2. This results in marked gastric distension which is exhibited on the current study and in gastroesophageal reflux. Electronically Signed   By: Zetta Bills M.D.   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 Would also consider placement of gastrostomy for decompression proximally - either in combination with jejunostomy placement or as a sole procedure if patient elects to pursue comfort measures instead. If patient elected for palliative gastrostomy only, would recommend IR consult for this. - will readdress with patient and wife tomorrow when patient is more alert and wife is also able to participate in conversation  Norm Parcel, [facility] 06/01/2020, 1:41 PM Please see Amion for pager number during day hours 7:00am-4:30pm

## 2020-06-01 NOTE — Progress Notes (Signed)
eLink Physician-Brief Progress Note Patient Name: Jeffrey Harris DOB: [birth year] MRN: [id number]   Date of Service  06/01/2020  HPI/Events of Note  There is difficulty reliably measuring patient's blood pressure non-invasively.  eICU Interventions  Arterial line ordered.        Frederik Pear 06/01/2020, 9:41 PM

## 2020-06-01 NOTE — Brief Op Note (Signed)
05/29/2020 - 06/01/2020  12:39 PM  PATIENT:  Jeffrey Harris  64 y.o. male  PRE-OPERATIVE DIAGNOSIS:  abnormal UGI series, partial duodenal obstruction from pancreatic cancer  POST-OPERATIVE DIAGNOSIS:  severe esophagitis, pancreatic cancer metastisis  PROCEDURE:  Procedure(s): ESOPHAGOGASTRODUODENOSCOPY (EGD) WITH PROPOFOL (N/A)  SURGEON:  Surgeon(s) and Role:    Ronnette Juniper, MD - Primary  PHYSICIAN ASSISTANT:   ASSISTANTS: Nolon Rod   ANESTHESIA:   MAC  EBL:  None  BLOOD ADMINISTERED:none  DRAINS: none   LOCAL MEDICATIONS USED:  NONE  SPECIMEN:  No Specimen  DISPOSITION OF SPECIMEN:  N/A  COUNTS:  YES  TOURNIQUET:  * No tourniquets in log *  DICTATION: .Dragon Dictation  PLAN OF CARE: Admit to inpatient   PATIENT DISPOSITION:  PACU - hemodynamically stable.   Delay start of Pharmacological VTE agent (>24hrs) due to surgical blood loss or risk of bleeding: not applicable

## 2020-06-01 NOTE — Transfer of Care (Signed)
Immediate Anesthesia Transfer of Care Note  Patient: Jeffrey Harris  Procedure(s) Performed: Procedure(s): ESOPHAGOGASTRODUODENOSCOPY (EGD) WITH PROPOFOL (N/A)  Patient Location: PACU  Anesthesia Type:General  Level of Consciousness: Patient easily awoken, sedated, comfortable, cooperative, following commands, responds to stimulation.   Airway & Oxygen Therapy: Patient spontaneously breathing, ventilating well, oxygen via simple oxygen mask.  Post-op Assessment: Report given to ENDO RN, vital signs reviewed and stable, moving all extremities.   Post vital signs: Reviewed and stable.  Complications: No apparent anesthesia complications  Last Vitals:  Vitals Value Taken Time  BP 102/62 06/01/20 1240  Temp 36.8 C 06/01/20 1238  Pulse 93 06/01/20 1244  Resp 18 06/01/20 1244  SpO2 94 % 06/01/20 1244  Vitals shown include unvalidated device data.  Last Pain:  Vitals:   06/01/20 1238  TempSrc: Axillary  PainSc: 0-No pain         Complications: No complications documented.

## 2020-06-02 ENCOUNTER — Encounter (HOSPITAL_COMMUNITY): Payer: Self-pay | Admitting: Internal Medicine

## 2020-06-02 ENCOUNTER — Inpatient Hospital Stay (HOSPITAL_COMMUNITY): Payer: BC Managed Care – PPO

## 2020-06-02 DIAGNOSIS — Z7189 Other specified counseling: Secondary | ICD-10-CM | POA: Diagnosis not present

## 2020-06-02 DIAGNOSIS — R531 Weakness: Secondary | ICD-10-CM | POA: Diagnosis not present

## 2020-06-02 DIAGNOSIS — J9601 Acute respiratory failure with hypoxia: Secondary | ICD-10-CM

## 2020-06-02 DIAGNOSIS — J690 Pneumonitis due to inhalation of food and vomit: Secondary | ICD-10-CM

## 2020-06-02 DIAGNOSIS — Z515 Encounter for palliative care: Secondary | ICD-10-CM

## 2020-06-02 DIAGNOSIS — K5900 Constipation, unspecified: Secondary | ICD-10-CM

## 2020-06-02 DIAGNOSIS — E860 Dehydration: Secondary | ICD-10-CM | POA: Diagnosis not present

## 2020-06-02 LAB — COMPREHENSIVE METABOLIC PANEL
ALT: 18 U/L (ref 0–44)
AST: 33 U/L (ref 15–41)
Albumin: 2.6 g/dL — ABNORMAL LOW (ref 3.5–5.0)
Alkaline Phosphatase: 125 U/L (ref 38–126)
Anion gap: 17 — ABNORMAL HIGH (ref 5–15)
BUN: 15 mg/dL (ref 8–23)
CO2: 20 mmol/L — ABNORMAL LOW (ref 22–32)
Calcium: 7 mg/dL — ABNORMAL LOW (ref 8.9–10.3)
Chloride: 91 mmol/L — ABNORMAL LOW (ref 98–111)
Creatinine, Ser: 1 mg/dL (ref 0.61–1.24)
GFR calc Af Amer: >60 mL/min (ref >60)
GFR calc non Af Amer: >60 mL/min (ref >60)
Glucose, Bld: 184 mg/dL — ABNORMAL HIGH (ref 70–99)
Potassium: 3.9 mmol/L (ref 3.5–5.1)
Sodium: 128 mmol/L — ABNORMAL LOW (ref 135–145)
Total Bilirubin: 2 mg/dL — ABNORMAL HIGH (ref 0.3–1.2)
Total Protein: 5.1 g/dL — ABNORMAL LOW (ref 6.5–8.1)

## 2020-06-02 LAB — URINALYSIS, COMPLETE (UACMP) WITH MICROSCOPIC
Bacteria, UA: NONE SEEN
Bilirubin Urine: NEGATIVE
Glucose, UA: NEGATIVE mg/dL
Hgb urine dipstick: NEGATIVE
Ketones, ur: 5 mg/dL — AB
Leukocytes,Ua: NEGATIVE
Nitrite: NEGATIVE
Protein, ur: NEGATIVE mg/dL
Specific Gravity, Urine: 1.014 (ref 1.005–1.030)
pH: 6 (ref 5.0–8.0)

## 2020-06-02 LAB — CBC WITH DIFFERENTIAL/PLATELET
Abs Immature Granulocytes: 0.29 10*3/uL — ABNORMAL HIGH (ref 0.00–0.07)
Basophils Absolute: 0.1 10*3/uL (ref 0.0–0.1)
Basophils Relative: 1 %
Eosinophils Absolute: 0.3 10*3/uL (ref 0.0–0.5)
Eosinophils Relative: 1 %
HCT: 34.4 % — ABNORMAL LOW (ref 39.0–52.0)
Hemoglobin: 12 g/dL — ABNORMAL LOW (ref 13.0–17.0)
Immature Granulocytes: 1 %
Lymphocytes Relative: 2 %
Lymphs Abs: 0.4 10*3/uL — ABNORMAL LOW (ref 0.7–4.0)
MCH: 34.2 pg — ABNORMAL HIGH (ref 26.0–34.0)
MCHC: 34.9 g/dL (ref 30.0–36.0)
MCV: 98 fL (ref 80.0–100.0)
Monocytes Absolute: 0.8 10*3/uL (ref 0.1–1.0)
Monocytes Relative: 3 %
Neutro Abs: 24 10*3/uL — ABNORMAL HIGH (ref 1.7–7.7)
Neutrophils Relative %: 92 %
Platelets: 101 10*3/uL — ABNORMAL LOW (ref 150–400)
RBC: 3.51 MIL/uL — ABNORMAL LOW (ref 4.22–5.81)
RDW: 19.5 % — ABNORMAL HIGH (ref 11.5–15.5)
WBC: 25.9 10*3/uL — ABNORMAL HIGH (ref 4.0–10.5)
nRBC: 0 % (ref 0.0–0.2)

## 2020-06-02 LAB — GLUCOSE, CAPILLARY
Glucose-Capillary: 199 mg/dL — ABNORMAL HIGH (ref 70–99)
Glucose-Capillary: 199 mg/dL — ABNORMAL HIGH (ref 70–99)
Glucose-Capillary: 210 mg/dL — ABNORMAL HIGH (ref 70–99)
Glucose-Capillary: 213 mg/dL — ABNORMAL HIGH (ref 70–99)

## 2020-06-02 LAB — MAGNESIUM: Magnesium: 2.2 mg/dL (ref 1.7–2.4)

## 2020-06-02 LAB — STREP PNEUMONIAE URINARY ANTIGEN: Strep Pneumo Urinary Antigen: NEGATIVE

## 2020-06-02 MED ORDER — ORAL CARE MOUTH RINSE
15.0000 mL | Freq: Two times a day (BID) | OROMUCOSAL | Status: DC
  Administered 2020-06-02 – 2020-06-09 (×14): 15 mL via OROMUCOSAL

## 2020-06-02 MED ORDER — HYDROCORTISONE NA SUCCINATE PF 100 MG IJ SOLR
50.0000 mg | Freq: Four times a day (QID) | INTRAMUSCULAR | Status: DC
  Administered 2020-06-02 – 2020-06-03 (×4): 50 mg via INTRAVENOUS
  Filled 2020-06-02 (×4): qty 2

## 2020-06-02 MED ORDER — SODIUM CHLORIDE 3 % IN NEBU
4.0000 mL | INHALATION_SOLUTION | Freq: Every day | RESPIRATORY_TRACT | Status: AC
  Administered 2020-06-02 – 2020-06-04 (×3): 4 mL via RESPIRATORY_TRACT
  Filled 2020-06-02 (×3): qty 4

## 2020-06-02 NOTE — Progress Notes (Signed)
When I walked in this morning patient was asleep on a ventimask.  He is on 2 pressors for his BP.  Levo and neo.  Patient with significant aspiration PNA that he is being treated for now.  I did not wake him and his wife was not present.  If he requires anything surgical he will need to improve from a respiratory standpoint prior to being placed on a vent for surgery.  Also if no desire for feeding access, but just decompression, could discuss with IR about a g-tube placement by them to avoid surgery that may be difficult for him to tolerate at this time.  Agree with palliative care involvement given his overall prognosis seems poor at this point.  We will follow.  Henreitta Cea 9:41 AM 06/02/2020

## 2020-06-02 NOTE — Progress Notes (Signed)
PROGRESS NOTE    Jeffrey Harris  WNI:[id number] DOB: 01/09/1956 DOA: 05/29/2020 PCP: [name], MD    Chief Complaint  Patient presents with  . 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 Marin Olp, history of gout presented to [facility], ED with generalized weakness, intractable nausea and vomiting.  Patient reported that for the past 4 weeks he has been bouts of nausea and vomiting.  Described as nonbilious, nonbloody vomiting typically after attempting to eat solids.  Patient ported experiencing numerous episodes daily.  Also reported generalized weakness, lightheadedness upon standing up.  Denied any fevers, diarrhea, sick contacts, recent travel.  Recently diagnosed with malignant ascites, undergone paracentesis 3 times since diagnosed on September 3.  Patient last received Gemzar and Abraxane on 9/7 Patient was found to be profoundly volume depleted in ED with moderate hyponatremia, severe hypokalemia, hypochloremia and alkalosis.  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  Postprocedure patient seemed to be doing fine however patient was transferred to the floor and noted to be tachycardic initially and subsequently hypotensive, significant pallor, cough.  Concern for septic shock versus acute bleed versus possible perforation as patient underwent recent procedure with a large mass noted.  Patient transferred to the ICU, currently on Levophed and Neo-Synephrine drips with improvement with hypotension.  Patient also on IV Solu-Cortef.  Chest x-ray done concerning for left-sided pneumonia.  Patient noted to have a elevated lactic acid level of 5.6 which has trended down to 3.0, elevated procalcitonin of 2.80, blood cultures ordered and pending, urine strep pneumococcus antigen negative.  Urine Legionella antigen pending.  Urinalysis nitrite negative, leukocytes negative.  CT abdomen and pelvis negative for any perforation.  CT chest which was done with dense consolidation and airspace disease throughout the left lung and in the right lung base, small pleural effusions, fluid-filled esophagus possibly indicating reflux or dysmotility, mass in the head of the pancreas Patel interval enlargement, porta hepatis, celiac axis and retroperitoneal lymph nodes enlarged likely metastatic, diffuse free fluid throughout the abdomen and pelvis likely ascites, bilateral adrenal gland nodules likely metastatic, enlarged prostate gland, no evidence of bowel obstruction or perforation.  Continue empiric IV cefepime.  IV vancomycin discontinued.  Continue Mucinex, Xopenex, Atrovent nebs.  PCCM following and I appreciate input and recommendations.  2 acute respiratory failure with hypoxia secondary to aspiration pneumonia Patient noted to be severe sepsis with septic shock with shortness of breath post endoscopy 06/01/2020.  Chest x-ray CT chest done with a left-sided pneumonia and right basilar pneumonia.  Patient with increasing O2 requirements currently on 15 L high flow nasal  cannula and nonrebreather.  Urine strep pneumococcus antigen negative.  Urine Legionella antigen pending.  Sputum Gram stain and cultures pending.  Continue empiric IV cefepime, Xopenex and Atrovent nebs.  PCCM consulted and are following.  3 metastatic pancreatic cancer to the liver with recent diagnosis of malignant ascites status post paracentesis/obstructing mass Patient being followed by oncology, Dr. Marin Olp last received chemotherapy with Gemzar and Abraxane 05/10/2020.  Patient seen by gastroenterology due to concerns for nausea and vomiting, abdominal x-ray done showed no high-grade obstructive bowel gas pattern, no free air, moderate colonic stool burden with CT abdomen and pelvis done showing persistent enlargement of pancreatic head with distal CBD stent in place.  Upper GI series showed moderate to marked partial duodenal obstruction at the level of the pancreatic tumor and stent.  Patient underwent upper endoscopy 06/01/2020, which revealed a large obstructing duodenal mass.  Gastroenterology recommended general surgery consultation for placement of feeding jejunostomy tube.  General surgery consulted and are following.  Oncology following.  Palliative care following.  4.  Severe electrolyte  abnormalities/hypokalemia/hyponatremia/hypochloremia secondary to GI losses with intractable nausea and vomiting On admission patient noted to have a potassium of 2.4, sodium of 127, chloride less than 65, CO2 of 42.  Patient placed on aggressive IV fluid hydration, potassium supplementation.  Serum osmolality at 264, urine osmolality of 682, urine sodium of 144 concern for possible SIADH component due to malignancy.  Abdominal films done with no high-grade obstructive bowel gas pattern, no free air, moderate colonic stool burden.  CT abdomen showed persistent enlargement of pancreatic head with a distal CBD stent in place.  GI was consulted upper GI series showed marked partial duodenal obstruction at the  level of pancreatic tumor stent.  Patient underwent upper endoscopy this morning that showed a large obstructing duodenal mass.  Patient currently n.p.o.  General surgery consulted to assess for placement of feeding jejunostomy tube.  Replete electrolytes.  Follow.  5.  Severe dehydration Patient on IV fluids.   6.  Malignant ascites Last paracentesis 05/23/2020.  We will hold off on paracentesis at this time.  7.  Prolonged QT interval EKG showed a prolonged QTC of 527 on admission.  Patient noted to have severe electrolyte abnormalities.  Repeat EKG with resolution of QT prolongation.  Keep potassium >4, magnesium >2.  Avoid QT prolongation medications.  Follow.  8.  Constipation Continue daily Dulcolax suppositories.  It is noted patient refused suppository today.  9.  Hyponatremia Secondary to hypovolemic hyponatremia.  Improving with hydration.  Follow.  10.  Failure to thrive Secondary to metastatic pancreatic cancer.  Palliative care following.  Patient currently n.p.o. with large obstructing pancreatic mass.  General surgery consulted for placement of jejunostomy tube.  Tube placement on hold due to current respiratory status.  Follow.   DVT prophylaxis: SCDs Code Status: Full Family Communication: Updated patient and wife at bedside.  Disposition:   Status is: Inpatient    Dispo: The patient is from: Home              Anticipated d/c is to: To be determined              Anticipated d/c date is: To be determined.              Patient with septic shock secondary to aspiration pneumonia, on 2 pressors, on IV antibiotics, metastatic pancreatic cancer with obstruction.  Not stable for discharge.       Consultants:   General surgery: Dr. Hassell Done 06/01/2020  Palliative care:  Dr. Rowe Pavy Jun 01, 2020  Gastroenterology: Dr. Collene Mares May 30, 2020  Oncology: Dr. Marin Olp May 30, 2020  PCCM: Dr Ander Slade 06/01/2020  Procedures:  Upper endoscopy Jun 01, 2020  CT abdomen and  pelvis May 30, 2020  Chest x-ray May 30, 2020, May 31, 2020, Jun 01, 2020  CT abdomen and pelvis 06/01/2020  CT chest 06/01/2020  Antimicrobials:   IV cefepime Jun 01, 2020>>>>  IV vancomycin Jun 01, 2020>>>>>   Subjective: Patient alert this morning.  Pallor has improved.  Denies any significant chest pain.  States he feels shortness of breath has improved.  On 15 L high flow nasal cannula and nonrebreather.  Speaking in full sentences.  Patient with a cough.  Patient on 2 pressors.  Wife at bedside  Objective: Vitals:   06/02/20 1500 06/02/20 1530 06/02/20 1600 06/02/20 1630  BP:      Pulse: 94 79 87 96  Resp: (!) 29 (!) 28 (!) 21 (!) 24  Temp:    97.7 F (36.5 C)  TempSrc:    Oral  SpO2: 94% 96% 97% 93%  Weight:      Height:        Intake/Output Summary (Last 24 hours) at 06/02/2020 1711 Last data filed at 06/02/2020 1654 Gross per 24 hour  Intake 5796.35 ml  Output 1625 ml  Net 4171.35 ml   Filed Weights   05/31/20 0232 06/01/20 1128  Weight: 78 kg 78 kg    Examination:  General exam: Alert.  Pallor improved.[name] Respiratory system: Diffuse coarse breath sounds in the left lung and some coarse breath sounds in the right base.  No wheezing.  Fair air movement.  On nonrebreather.  On 15 L high flow nasal cannula. Cardiovascular system: Tachycardia.  No JVD, no murmurs rubs or gallops.  No lower extremity edema.  Gastrointestinal system: Abdomen is soft, nontender, nondistended, positive bowel sounds.  No rebound.  No guarding.  Central nervous system: Alert and oriented. No focal neurological deficits. Extremities: Symmetric 5 x 5 power. Skin: No rashes, lesions or ulcers Psychiatry: Judgement and insight appear normal. Mood & affect appropriate.     Data Reviewed: I have personally reviewed following labs and imaging studies  CBC: Recent Labs  Lab 05/30/20 0614 05/31/20 0930 06/01/20 0351 06/01/20 2142 06/02/20 0453  WBC 6.8 7.2 9.9 33.0* 25.9*    NEUTROABS  --  5.8  --  30.6* 24.0*  HGB 12.0* 12.2* 11.1* 13.8 12.0*  HCT 34.2* 35.7* 32.9* 40.0 34.4*  MCV 96.6 98.1 98.2 99.0 98.0  PLT 155 142* 113* 149* 101*    Basic Metabolic Panel: Recent Labs  Lab 05/29/20 2247 05/30/20 0614 05/30/20 1147 05/31/20 0930 06/01/20 0351 06/01/20 1652 06/02/20 0453  NA 127*   < > 126* 125* 127* 122* 128*  K 2.4*   < > 3.1* 3.2* 3.0* 3.9 3.9  CL <65*   < > 72* 80* 82* 81* 91*  CO2 42*   < > 40* 34* 34* 25 20*  GLUCOSE 118*   < > 89 95 98 98 184*  BUN 19   < > 17 12 8 10 15   CREATININE 1.01   < > 0.76 0.59* 0.61 0.95 1.00  CALCIUM 8.6*   < > 7.2* 7.3* 7.6* 7.3* 7.0*  MG 2.4  --   --  2.3 2.3 2.0 2.2   < > = values in this interval not displayed.    GFR: Estimated Creatinine Clearance: 82.6 mL/min (by C-G formula based  on SCr of 1 mg/dL).  Liver Function Tests: Recent Labs  Lab 05/29/20 2247 05/31/20 0930 06/01/20 1652 06/02/20 0453  AST 39 34 35 33  ALT 20 20 18 18   ALKPHOS 182* 168* 179* 125  BILITOT 1.8* 1.3* 2.0* 2.0*  PROT 7.3 5.6* 5.5* 5.1*  ALBUMIN 3.3* 2.7* 2.5* 2.6*    CBG: Recent Labs  Lab 05/29/20 2245 06/02/20 1210 06/02/20 1645  GLUCAP 137* 199* 213*     Recent Results (from the past 240 hour(s))  Respiratory Panel by RT PCR (Flu A&B, Covid) - Nasopharyngeal Swab     Status: None   Collection Time: 05/30/20  1:40 AM   Specimen: Nasopharyngeal Swab  Result Value Ref Range Status   SARS Coronavirus 2 by RT PCR NEGATIVE NEGATIVE Final    Comment: (NOTE) SARS-CoV-2 target nucleic acids are NOT DETECTED.  The SARS-CoV-2 RNA is generally detectable in upper respiratoy specimens during the acute phase of infection. The lowest concentration of SARS-CoV-2 viral copies this assay can detect is 131 copies/mL. A negative result does not preclude SARS-Cov-2 infection and should not be used as the sole basis for treatment or other patient management decisions. A negative result may occur with  improper  specimen collection/handling, submission of specimen other than nasopharyngeal swab, presence of viral mutation(s) within the areas targeted by this assay, and inadequate number of viral copies (<131 copies/mL). A negative result must be combined with clinical observations, patient history, and epidemiological information. The expected result is Negative.  Fact Sheet for Patients:  PinkCheek.be  Fact Sheet for Healthcare Providers:  GravelBags.it  This test is no t yet approved or cleared by the [place] FDA and  has been authorized for detection and/or diagnosis of SARS-CoV-2 by FDA under an Emergency Use Authorization (EUA). This EUA will remain  in effect (meaning this test can be used) for the duration of the COVID-19 declaration under Section 564(b)(1) of the Act, 21 U.S.C. section 360bbb-3(b)(1), unless the authorization is terminated or revoked sooner.     Influenza A by PCR NEGATIVE NEGATIVE Final   Influenza B by PCR NEGATIVE NEGATIVE Final    Comment: (NOTE) The Xpert Xpress SARS-CoV-2/FLU/RSV assay is intended as an aid in  the diagnosis of influenza from Nasopharyngeal swab specimens and  should not be used as a sole basis for treatment. Nasal washings and  aspirates are unacceptable for Xpert Xpress SARS-CoV-2/FLU/RSV  testing.  Fact Sheet for Patients: PinkCheek.be  Fact Sheet for Healthcare Providers: GravelBags.it  This test is not yet approved or cleared by the [place] FDA and  has been authorized for detection and/or diagnosis of SARS-CoV-2 by  FDA under an Emergency Use Authorization (EUA). This EUA will remain  in effect (meaning this test can be used) for the duration of the  Covid-19 declaration under Section 564(b)(1) of the Act, 21  U.S.C. section 360bbb-3(b)(1), unless the authorization is  terminated or revoked. Performed at  [facility], [place] [address], [place], [place] [address]   Culture, sputum-assessment     Status: None   Collection Time: 06/01/20  3:34 PM   Specimen: Sputum  Result Value Ref Range Status   Specimen Description SPU  Final   Special Requests NONE  Final   Sputum evaluation   Final    THIS SPECIMEN IS ACCEPTABLE FOR SPUTUM CULTURE Performed at [facility], [place] [address] [place], [place] [address]    Report Status 06/01/2020 FINAL  Final  Culture, respiratory  Status: None (Preliminary result)   Collection Time: 06/01/20  3:34 PM   Specimen: Sputum  Result Value Ref Range Status   Specimen Description   Final    SPU Performed at [place] [address], [place], [place] [address]    Special Requests   Final    NONE Reflexed from [id number] Performed at [facility], [place] [address], [place], [place] [address]    Gram Stain   Final    FEW WBC PRESENT,BOTH PMN AND MONONUCLEAR RARE GRAM NEGATIVE RODS MODERATE GRAM POSITIVE COCCI RARE YEAST    Culture   Final    CULTURE REINCUBATED FOR BETTER GROWTH Performed at [facility], [place] [address], [place], [place] [address]    Report Status PENDING  Incomplete  Culture, blood (Routine X 2) w Reflex to ID Panel     Status: None (Preliminary result)   Collection Time: 06/01/20  4:38 PM   Specimen: BLOOD  Result Value Ref Range Status   Specimen Description   Final    BLOOD LEFT ANTECUBITAL Performed at [facility] [address] [place], [place] [address]    Special Requests   Final    BOTTLES DRAWN AEROBIC ONLY Blood Culture adequate volume Performed at [facility] [address] [place], [place] [address]    Culture   Final    NO GROWTH < 24 HOURS Performed at [facility] [address] [place], [place] [address]    Report Status PENDING  Incomplete  Culture, blood (Routine X 2) w  Reflex to ID Panel     Status: None (Preliminary result)   Collection Time: 06/01/20  4:38 PM   Specimen: BLOOD LEFT HAND  Result Value Ref Range Status   Specimen Description   Final    BLOOD LEFT HAND Performed at [place] [address], [place], [place] [address]    Special Requests   Final    BOTTLES DRAWN AEROBIC ONLY Blood Culture adequate volume Performed at [facility] [address] [place], [place] [address]    Culture   Final    NO GROWTH < 24 HOURS Performed at [facility] [address], [place], [place] [address]    Report Status PENDING  Incomplete  MRSA PCR Screening     Status: None   Collection Time: 06/01/20  5:24 PM   Specimen: Nasal Mucosa; Nasopharyngeal  Result Value Ref Range Status   MRSA by PCR NEGATIVE NEGATIVE Final    Comment:        The GeneXpert MRSA Assay (FDA approved for NASAL specimens only), is one component of a comprehensive MRSA colonization surveillance program. It is not intended to diagnose MRSA infection nor to guide or monitor treatment for MRSA infections. Performed at [facility], [place] [address], [place], [place] [address]          Radiology Studies: CT ABDOMEN PELVIS WO CONTRAST  Result Date: 06/02/2020 CLINICAL DATA:  History of pancreatic cancer with liver metastasis and ascites. Vomiting and weakness. Multiple recent paracentesis. Patient is deteriorating. To evaluate for perforation. EXAM: CT CHEST, ABDOMEN AND PELVIS WITHOUT CONTRAST TECHNIQUE: Multidetector CT imaging of the chest, abdomen and pelvis was performed following the standard protocol without IV contrast. COMPARISON:  CT abdomen and pelvis 05/30/2020.  CT chest 05/03/2020 FINDINGS: CT CHEST FINDINGS Cardiovascular: Normal heart size. No pericardial effusions. Normal caliber thoracic aorta. Scattered  aortic and coronary artery calcifications. Right central venous catheter with tip in the  low SVC. Mediastinum/Nodes: Esophagus is decompressed but fluid-filled, possibly indicating reflux or dysmotility. Pretracheal lymph node measures about 1.5 cm diameter. A paraesophageal lymph node at the abdominal hiatus measures about 1.9 cm diameter. Lymph nodes are similar in size to previous studies. Heterogeneous appearance of the thyroid gland with scattered calcifications. No significant nodularity. Lungs/Pleura: There is dense consolidation and airspace disease throughout the left lung and in the right lung base. Air bronchograms are present. Small pleural effusions. Changes could represent pneumonia or possibly aspiration. No pneumothorax. Musculoskeletal: Degenerative changes in the spine. No destructive bone lesions. CT ABDOMEN PELVIS FINDINGS Hepatobiliary: The gallbladder is distended with thickened wall. A biliary stent is present without bile duct dilatation. Pancreas: Mass in the head of the pancreas without interval enlargement. Porta hepatis and celiac axis lymph nodes are enlarged, likely metastatic. No pancreatic ductal dilatation. Spleen: Normal in size without focal abnormality. Adrenals/Urinary Tract: 1.7 cm diameter left adrenal gland nodule. Additional 1.3 cm left adrenal gland nodule. No change in appearance. Low-attenuation suggests fat containing adenomas. Pelvic location of the right kidney. Kidneys appear otherwise symmetrical. No hydronephrosis. No stones. Bladder is unremarkable. Stomach/Bowel: Stomach, small bowel, and colon are not abnormally distended. Contrast material fills the colon. No free air or free contrast to suggest bowel perforation. Vascular/Lymphatic: Calcification of the aorta. No aneurysm. Retroperitoneal lymph nodes in the upper abdomen are mildly enlarged, similar prior study, likely metastatic. Reproductive: Prostate gland is enlarged. Other: Diffuse free fluid throughout the abdomen and pelvis, likely ascites. No free air. Edema in the subcutaneous fat.  Musculoskeletal: No destructive bone lesions. IMPRESSION: 1. Dense consolidation and airspace disease throughout the left lung and in the right lung base. Small pleural effusions. Changes likely to represent pneumonia or possibly aspiration. 2. Fluid-filled esophagus, possibly indicating reflux or dysmotility. 3. Mass in the head of the pancreas without interval enlargement. 4. Porta hepatis, celiac axis, and retroperitoneal lymph nodes are enlarged, likely metastatic. 5. Diffuse free fluid throughout the abdomen and pelvis, likely ascites. 6. Bilateral adrenal gland nodules, likely metastatic. 7. Pelvic location of the right kidney. 8. Enlarged prostate gland. 9. Aortic atherosclerosis. 10. No evidence of bowel obstruction or perforation. Aortic Atherosclerosis (ICD10-I70.0). Electronically Signed   By: Lucienne Capers M.D.   On: 06/02/2020 02:45   CT CHEST WO CONTRAST  Result Date: 06/02/2020 CLINICAL DATA:  History of pancreatic cancer with liver metastasis and ascites. Vomiting and weakness. Multiple recent paracentesis. Patient is deteriorating. To evaluate for perforation. EXAM: CT CHEST, ABDOMEN AND PELVIS WITHOUT CONTRAST TECHNIQUE: Multidetector CT imaging of the chest, abdomen and pelvis was performed following the standard protocol without IV contrast. COMPARISON:  CT abdomen and pelvis 05/30/2020.  CT chest 05/03/2020 FINDINGS: CT CHEST FINDINGS Cardiovascular: Normal heart size. No pericardial effusions. Normal caliber thoracic aorta. Scattered aortic and coronary artery calcifications. Right central venous catheter with tip in the low SVC. Mediastinum/Nodes: Esophagus is decompressed but fluid-filled, possibly indicating reflux or dysmotility. Pretracheal lymph node measures about 1.5 cm diameter. A paraesophageal lymph node at the abdominal hiatus measures about 1.9 cm diameter. Lymph nodes are similar in size to previous studies. Heterogeneous appearance of the thyroid gland with scattered  calcifications. No significant nodularity. Lungs/Pleura: There is dense consolidation and airspace disease throughout the left lung and in the right lung base. Air bronchograms are present. Small pleural effusions. Changes could represent pneumonia or possibly aspiration. No pneumothorax. Musculoskeletal: Degenerative changes in the  spine. No destructive bone lesions. CT ABDOMEN PELVIS FINDINGS Hepatobiliary: The gallbladder is distended with thickened wall. A biliary stent is present without bile duct dilatation. Pancreas: Mass in the head of the pancreas without interval enlargement. Porta hepatis and celiac axis lymph nodes are enlarged, likely metastatic. No pancreatic ductal dilatation. Spleen: Normal in size without focal abnormality. Adrenals/Urinary Tract: 1.7 cm diameter left adrenal gland nodule. Additional 1.3 cm left adrenal gland nodule. No change in appearance. Low-attenuation suggests fat containing adenomas. Pelvic location of the right kidney. Kidneys appear otherwise symmetrical. No hydronephrosis. No stones. Bladder is unremarkable. Stomach/Bowel: Stomach, small bowel, and colon are not abnormally distended. Contrast material fills the colon. No free air or free contrast to suggest bowel perforation. Vascular/Lymphatic: Calcification of the aorta. No aneurysm. Retroperitoneal lymph nodes in the upper abdomen are mildly enlarged, similar prior study, likely metastatic. Reproductive: Prostate gland is enlarged. Other: Diffuse free fluid throughout the abdomen and pelvis, likely ascites. No free air. Edema in the subcutaneous fat. Musculoskeletal: No destructive bone lesions. IMPRESSION: 1. Dense consolidation and airspace disease throughout the left lung and in the right lung base. Small pleural effusions. Changes likely to represent pneumonia or possibly aspiration. 2. Fluid-filled esophagus, possibly indicating reflux or dysmotility. 3. Mass in the head of the pancreas without interval  enlargement. 4. Porta hepatis, celiac axis, and retroperitoneal lymph nodes are enlarged, likely metastatic. 5. Diffuse free fluid throughout the abdomen and pelvis, likely ascites. 6. Bilateral adrenal gland nodules, likely metastatic. 7. Pelvic location of the right kidney. 8. Enlarged prostate gland. 9. Aortic atherosclerosis. 10. No evidence of bowel obstruction or perforation. Aortic Atherosclerosis (ICD10-I70.0). Electronically Signed   By: Lucienne Capers M.D.   On: 06/02/2020 02:45   DG CHEST PORT 1 VIEW  Result Date: 06/01/2020 CLINICAL DATA:  Shortness of breath status post EGD EXAM: PORTABLE CHEST 1 VIEW COMPARISON:  05/30/2020, CT 05/03/2020 FINDINGS: Right-sided central venous port tip over the SVC. The right lung is grossly clear. Interim development of fairly extensive consolidation and ground-glass opacity throughout the left thorax. No pleural effusion. Stable cardiomediastinal silhouette. No pneumothorax. IMPRESSION: Development of fairly extensive consolidation and ground-glass opacity throughout the left thorax, findings could be secondary to asymmetric edema, pulmonary hemorrhage, or diffuse pneumonia. Correlation with CT could be considered. Electronically Signed   By: Donavan Foil M.D.   On: 06/01/2020 16:57        Scheduled Meds: . bisacodyl  10 mg Rectal Daily  . Chlorhexidine Gluconate Cloth  6 each Topical Daily  . enoxaparin (LOVENOX) injection  40 mg Subcutaneous Q24H  . hydrocortisone sod succinate (SOLU-CORTEF) inj  50 mg Intravenous Q6H  . ipratropium  0.5 mg Nebulization Q6H  . levalbuterol  0.63 mg Nebulization Q6H  . mouth rinse  15 mL Mouth Rinse BID  . pantoprazole (PROTONIX) IV  40 mg Intravenous Q12H  . sodium chloride flush  10-40 mL Intracatheter Q12H  . sodium chloride HYPERTONIC  4 mL Nebulization Daily   Continuous Infusions: . sodium chloride Stopped (06/02/20 1631)  . sodium chloride    . ceFEPime (MAXIPIME) IV Stopped (06/02/20 0844)  .  chlorproMAZINE (THORAZINE) IV Stopped (06/02/20 0309)  . metoCLOPramide (REGLAN) injection Stopped (06/02/20 1646)  . norepinephrine (LEVOPHED) Adult infusion 32 mcg/min (06/02/20 1651)  . vancomycin Stopped (06/01/20 1948)     LOS: 3 days    Time spent: 40 min    Irine Seal, MD [facility]   To contact the attending provider between 7A-7P or the  covering provider during after hours 7P-7A, please log into the web site [email or web address] and access using universal [place] password for that web site. If you do not have the password, please call the hospital operator.  06/02/2020, 5:11 PM

## 2020-06-02 NOTE — Progress Notes (Signed)
RN bladder scanned patient, 350-458 mL in bladder. In and out x1 preformed. 400 mL total output removed. U/A and cultures sent per order.

## 2020-06-02 NOTE — Progress Notes (Signed)
Subjective: Events of yesterday noted, discussed with patient's hospitalist Dr. Grandville Silos. Patient is currently in ICU on 2 IV pressors, high flow oxygen and nonrebreather. He denies any abdominal pain, complains of dry mouth and wants to drink fluids.  Objective: Vital signs in last 24 hours: Temp:  [97.6 F (36.4 C)-99.9 F (37.7 C)] 98.1 F (36.7 C) (09/30 0800) Pulse Rate:  [72-119] 81 (09/30 0820) Resp:  [16-47] 20 (09/30 0820) BP: (52-144)/(35-122) 104/64 (09/30 0100) SpO2:  [80 %-98 %] 96 % (09/30 0848) Arterial Line BP: (84-132)/(51-68) 111/61 (09/30 0820) Weight change:  Last BM Date: 05/31/20  PE: Able to speak in full sentences, not using accessory muscles of respiration GENERAL: No pallor, no icterus ABDOMEN: Soft, nondistended, nontender, normoactive bowel sounds EXTREMITIES: No deformity, no edema  Lab Results: Results for orders placed or performed during the hospital encounter of 05/29/20 (from the past 48 hour(s))  CBC     Status: Abnormal   Collection Time: 06/01/20  3:51 AM  Result Value Ref Range   WBC 9.9 4.0 - 10.5 K/uL   RBC 3.35 (L) 4.22 - 5.81 MIL/uL   Hemoglobin 11.1 (L) 13.0 - 17.0 g/dL   HCT 32.9 (L) 39 - 52 %   MCV 98.2 80.0 - 100.0 fL   MCH 33.1 26.0 - 34.0 pg   MCHC 33.7 30.0 - 36.0 g/dL   RDW 18.7 (H) 11.5 - 15.5 %   Platelets 113 (L) 150 - 400 K/uL    Comment: REPEATED TO VERIFY PLATELET COUNT CONFIRMED BY SMEAR SPECIMEN CHECKED FOR CLOTS Immature Platelet Fraction may be clinically indicated, consider ordering this additional test [id number]    nRBC 0.0 0.0 - 0.2 %    Comment: Performed at [facility], [place] [address], [place], [place] [address]  Basic metabolic panel     Status: Abnormal   Collection Time: 06/01/20  3:51 AM  Result Value Ref Range   Sodium 127 (L) 135 - 145 mmol/L   Potassium 3.0 (L) 3.5 - 5.1 mmol/L   Chloride 82 (L) 98 - 111 mmol/L   CO2 34 (H) 22 - 32 mmol/L   Glucose, Bld 98 70 - 99 mg/dL     Comment: Glucose reference range applies only to samples taken after fasting for at least 8 hours.   BUN 8 8 - 23 mg/dL   Creatinine, Ser 0.61 0.61 - 1.24 mg/dL   Calcium 7.6 (L) 8.9 - 10.3 mg/dL   GFR calc non Af Amer >60 >60 mL/min   GFR calc Af Amer >60 >60 mL/min   Anion gap 11 5 - 15    Comment: Performed at [facility], [place] [address], [place], [place] [address]  Magnesium     Status: None   Collection Time: 06/01/20  3:51 AM  Result Value Ref Range   Magnesium 2.3 1.7 - 2.4 mg/dL    Comment: Performed at [facility], [place] [address] [place], [place] [address]  Prealbumin     Status: Abnormal   Collection Time: 06/01/20  2:49 PM  Result Value Ref Range   Prealbumin <5 (L) 18 - 38 mg/dL    Comment: Performed at [facility], [place] [address] [place], [place] [address]  Culture, sputum-assessment     Status: None   Collection Time: 06/01/20  3:34 PM   Specimen: Sputum  Result Value Ref Range   Specimen Description SPU    Special Requests NONE    Sputum evaluation  THIS SPECIMEN IS ACCEPTABLE FOR SPUTUM CULTURE Performed at [facility], [place] [address] [place], [place] [address]    Report Status 06/01/2020 FINAL   Culture, respiratory     Status: None (Preliminary result)   Collection Time: 06/01/20  3:34 PM   Specimen: Sputum  Result Value Ref Range   Specimen Description      SPU Performed at [place] [address], [place], [place] [address]    Special Requests      NONE Reflexed from [id number] Performed at [facility], [place] [address], [place], [place] [address]    Gram Stain      FEW WBC PRESENT,BOTH PMN AND MONONUCLEAR RARE GRAM NEGATIVE RODS MODERATE GRAM POSITIVE COCCI RARE YEAST    Culture      CULTURE REINCUBATED FOR BETTER GROWTH Performed at [facility], [place] [address] [place], [place] [address]    Report Status PENDING    Culture, blood (Routine X 2) w Reflex to ID Panel     Status: None (Preliminary result)   Collection Time: 06/01/20  4:38 PM   Specimen: BLOOD  Result Value Ref Range   Specimen Description      BLOOD LEFT ANTECUBITAL Performed at [facility], [place] [address] [place], [place] [address]    Special Requests      BOTTLES DRAWN AEROBIC ONLY Blood Culture adequate volume Performed at [place] [address] [place], [place] [address]    Culture      NO GROWTH < 24 HOURS Performed at [place] [address] [place], [place] [address]    Report Status PENDING   Culture, blood (Routine X 2) w Reflex to ID Panel     Status: None (Preliminary result)   Collection Time: 06/01/20  4:38 PM   Specimen: BLOOD LEFT HAND  Result Value Ref Range   Specimen Description      BLOOD LEFT HAND Performed at [place] [address], [place], [place] [address]    Special Requests      BOTTLES DRAWN AEROBIC ONLY Blood Culture adequate volume Performed at [place] [address] [place], [place] [address]    Culture      NO GROWTH < 24 HOURS Performed at [place] [address], [place], [place] [address]    Report Status PENDING   Magnesium     Status: None   Collection Time: 06/01/20  4:52 PM  Result Value Ref Range   Magnesium 2.0 1.7 - 2.4 mg/dL    Comment: Performed at [facility], [place] [address] [place], [place] [address]  Comprehensive metabolic panel     Status: Abnormal   Collection Time: 06/01/20  4:52 PM  Result Value Ref Range   Sodium 122 (L) 135 - 145 mmol/L   Potassium 3.9 3.5 - 5.1 mmol/L    Comment: DELTA CHECK NOTED NO VISIBLE HEMOLYSIS    Chloride 81 (L) 98 - 111 mmol/L   CO2 25 22 - 32 mmol/L   Glucose, Bld 98 70 - 99 mg/dL    Comment: Glucose reference range applies only to samples taken after fasting for at least 8 hours.   BUN 10 8 -  23 mg/dL   Creatinine, Ser 0.95 0.61 - 1.24 mg/dL   Calcium 7.3 (L) 8.9 - 10.3 mg/dL   Total Protein 5.5 (L) 6.5 - 8.1 g/dL  Albumin 2.5 (L) 3.5 - 5.0 g/dL   AST 35 15 - 41 U/L   ALT 18 0 - 44 U/L   Alkaline Phosphatase 179 (H) 38 - 126 U/L   Total Bilirubin 2.0 (H) 0.3 - 1.2 mg/dL   GFR calc non Af Amer >60 >60 mL/min   GFR calc Af Amer >60 >60 mL/min   Anion gap 16 (H) 5 - 15    Comment: Performed at [facility], [place] [address] [place], [place] [address]  Lactic acid, plasma     Status: Abnormal   Collection Time: 06/01/20  4:52 PM  Result Value Ref Range   Lactic Acid, Venous 5.6 (HH) 0.5 - 1.9 mmol/L    Comment: CRITICAL RESULT CALLED TO, READ BACK BY AND VERIFIED WITH: BLACKBURN,B. RN @1733  06/01/20 BILLINGSLEY,L Performed at [facility], [place] [address], [place], [place] [address]   Protime-INR     Status: Abnormal   Collection Time: 06/01/20  4:52 PM  Result Value Ref Range   Prothrombin Time 22.9 (H) 11.4 - 15.2 seconds   INR 2.1 (H) 0.8 - 1.2    Comment: (NOTE) INR goal varies based on device and disease states. Performed at [facility], [place] [address] [place], [place] [address]   APTT     Status: Abnormal   Collection Time: 06/01/20  4:52 PM  Result Value Ref Range   aPTT 44 (H) 24 - 36 seconds    Comment:        IF BASELINE aPTT IS ELEVATED, SUGGEST PATIENT RISK ASSESSMENT BE USED TO DETERMINE APPROPRIATE ANTICOAGULANT THERAPY. Performed at [facility], [place] [address] [place], [place] [address]   Procalcitonin     Status: None   Collection Time: 06/01/20  4:52 PM  Result Value Ref Range   Procalcitonin 2.80 ng/mL    Comment:        Interpretation: PCT > 2 ng/mL: Systemic infection (sepsis) is likely, unless other causes are known. (NOTE)       Sepsis PCT Algorithm           Lower Respiratory Tract                                      Infection PCT Algorithm     ----------------------------     ----------------------------         PCT < 0.25 ng/mL                PCT < 0.10 ng/mL          Strongly encourage             Strongly discourage   discontinuation of antibiotics    initiation of antibiotics    ----------------------------     -----------------------------       PCT 0.25 - 0.50 ng/mL            PCT 0.10 - 0.25 ng/mL               OR       >80% decrease in PCT            Discourage initiation of  antibiotics      Encourage discontinuation           of antibiotics    ----------------------------     -----------------------------         PCT >= 0.50 ng/mL              PCT 0.26 - 0.50 ng/mL               AND       <80% decrease in PCT              Encourage initiation of                                             antibiotics       Encourage continuation           of antibiotics    ----------------------------     -----------------------------        PCT >= 0.50 ng/mL                  PCT > 0.50 ng/mL               AND         increase in PCT                  Strongly encourage                                      initiation of antibiotics    Strongly encourage escalation           of antibiotics                                     -----------------------------                                           PCT <= 0.25 ng/mL                                                 OR                                        > 80% decrease in PCT                                      Discontinue / Do not initiate                                             antibiotics  Performed at [place] [address] [place], [place] [address]   Cortisol     Status: None   Collection Time: 06/01/20  4:52 PM  Result Value Ref Range   Cortisol, Plasma 27.7 ug/dL    Comment: (NOTE) AM    6.7 - 22.6 ug/dL PM   <10.0       ug/dL Performed at [place] [address], [place],  [place] [address]   Type and screen [place]     Status: None   Collection Time: 06/01/20  4:52 PM  Result Value Ref Range   ABO/RH(D) A NEG    Antibody Screen NEG    Sample Expiration      06/04/2020,2359 Performed at [facility], [place] [address] [place], [place] [address]   MRSA PCR Screening     Status: None   Collection Time: 06/01/20  5:24 PM   Specimen: Nasal Mucosa; Nasopharyngeal  Result Value Ref Range   MRSA by PCR NEGATIVE NEGATIVE    Comment:        The GeneXpert MRSA Assay (FDA approved for NASAL specimens only), is one component of a comprehensive MRSA colonization surveillance program. It is not intended to diagnose MRSA infection nor to guide or monitor treatment for MRSA infections. Performed at [facility], [place] [address], [place], [place] [address]   Lactic acid, plasma     Status: Abnormal   Collection Time: 06/01/20  9:42 PM  Result Value Ref Range   Lactic Acid, Venous 3.0 (HH) 0.5 - 1.9 mmol/L    Comment: CRITICAL VALUE NOTED.  VALUE IS CONSISTENT WITH PREVIOUSLY REPORTED AND CALLED VALUE. Performed at [facility], [place] [address], [place], [place] [address]   CBC with Differential/Platelet     Status: Abnormal   Collection Time: 06/01/20  9:42 PM  Result Value Ref Range   WBC 33.0 (H) 4.0 - 10.5 K/uL   RBC 4.04 (L) 4.22 - 5.81 MIL/uL   Hemoglobin 13.8 13.0 - 17.0 g/dL   HCT 40.0 39 - 52 %   MCV 99.0 80.0 - 100.0 fL   MCH 34.2 (H) 26.0 - 34.0 pg   MCHC 34.5 30.0 - 36.0 g/dL   RDW 19.4 (H) 11.5 - 15.5 %   Platelets 149 (L) 150 - 400 K/uL   nRBC 0.0 0.0 - 0.2 %   Neutrophils Relative % 91 %   Neutro Abs 30.6 (H) 1.7 - 7.7 K/uL   Lymphocytes Relative 2 %   Lymphs Abs 0.6 (L) 0.7 - 4.0 K/uL   Monocytes Relative 4 %   Monocytes Absolute 1.2 (H) 0 - 1 K/uL   Eosinophils Relative 1 %   Eosinophils Absolute 0.2 0 - 0 K/uL   Basophils Relative 1 %   Basophils Absolute 0.2  (H) 0 - 0 K/uL   WBC Morphology MILD LEFT SHIFT (1-5% METAS, OCC MYELO, OCC BANDS)     Comment: VACUOLATED NEUTROPHILS   Immature Granulocytes 1 %   Abs Immature Granulocytes 0.21 (H) 0.00 - 0.07 K/uL    Comment: Performed at [facility], [place] [address], [place], [place] [address]  Strep pneumoniae urinary antigen     Status: None   Collection Time: 06/02/20 12:00 AM  Result Value Ref Range   Strep Pneumo Urinary Antigen NEGATIVE NEGATIVE    Comment:        Infection due to S. pneumoniae cannot be absolutely ruled out since the antigen present may be below the detection limit of the test. Performed at [facility], [place] [address] [place], [place] [address]  Urinalysis, Complete w Microscopic     Status: Abnormal   Collection Time: 06/02/20 12:23 AM  Result Value Ref Range   Color, Urine YELLOW YELLOW   APPearance CLEAR CLEAR   Specific Gravity, Urine 1.014 1.005 - 1.030   pH 6.0 5.0 - 8.0   Glucose, UA NEGATIVE NEGATIVE mg/dL   Hgb urine dipstick NEGATIVE NEGATIVE   Bilirubin Urine NEGATIVE NEGATIVE   Ketones, ur 5 (A) NEGATIVE mg/dL   Protein, ur NEGATIVE NEGATIVE mg/dL   Nitrite NEGATIVE NEGATIVE   Leukocytes,Ua NEGATIVE NEGATIVE   RBC / HPF 0-5 0 - 5 RBC/hpf   WBC, UA 0-5 0 - 5 WBC/hpf   Bacteria, UA NONE SEEN NONE SEEN   Mucus PRESENT    Hyaline Casts, UA PRESENT     Comment: Performed at [facility], [place] [address], [place], [place] [address]  Comprehensive metabolic panel     Status: Abnormal   Collection Time: 06/02/20  4:53 AM  Result Value Ref Range   Sodium 128 (L) 135 - 145 mmol/L   Potassium 3.9 3.5 - 5.1 mmol/L   Chloride 91 (L) 98 - 111 mmol/L   CO2 20 (L) 22 - 32 mmol/L   Glucose, Bld 184 (H) 70 - 99 mg/dL    Comment: Glucose reference range applies only to samples taken after fasting for at least 8 hours.   BUN 15 8 - 23 mg/dL   Creatinine, Ser 1.00 0.61 - 1.24 mg/dL   Calcium 7.0 (L) 8.9 - 10.3 mg/dL    Total Protein 5.1 (L) 6.5 - 8.1 g/dL   Albumin 2.6 (L) 3.5 - 5.0 g/dL   AST 33 15 - 41 U/L   ALT 18 0 - 44 U/L   Alkaline Phosphatase 125 38 - 126 U/L   Total Bilirubin 2.0 (H) 0.3 - 1.2 mg/dL   GFR calc non Af Amer >60 >60 mL/min   GFR calc Af Amer >60 >60 mL/min   Anion gap 17 (H) 5 - 15    Comment: Performed at [facility], [place] [address] [place], [place] [address]  CBC with Differential/Platelet     Status: Abnormal   Collection Time: 06/02/20  4:53 AM  Result Value Ref Range   WBC 25.9 (H) 4.0 - 10.5 K/uL   RBC 3.51 (L) 4.22 - 5.81 MIL/uL   Hemoglobin 12.0 (L) 13.0 - 17.0 g/dL   HCT 34.4 (L) 39 - 52 %   MCV 98.0 80.0 - 100.0 fL   MCH 34.2 (H) 26.0 - 34.0 pg   MCHC 34.9 30.0 - 36.0 g/dL   RDW 19.5 (H) 11.5 - 15.5 %   Platelets 101 (L) 150 - 400 K/uL    Comment: REPEATED TO VERIFY Immature Platelet Fraction may be clinically indicated, consider ordering this additional test [id number]    nRBC 0.0 0.0 - 0.2 %   Neutrophils Relative % 92 %   Neutro Abs 24.0 (H) 1.7 - 7.7 K/uL   Lymphocytes Relative 2 %   Lymphs Abs 0.4 (L) 0.7 - 4.0 K/uL   Monocytes Relative 3 %   Monocytes Absolute 0.8 0 - 1 K/uL   Eosinophils Relative 1 %   Eosinophils Absolute 0.3 0 - 0 K/uL   Basophils Relative 1 %   Basophils Absolute 0.1 0 - 0 K/uL   WBC Morphology MILD LEFT SHIFT (1-5% METAS, OCC MYELO, OCC BANDS)    Immature Granulocytes 1 %   Abs Immature Granulocytes 0.29 (H) 0.00 -  0.07 K/uL    Comment: Performed at [facility], [place] [address] [place], [place] [address]  Magnesium     Status: None   Collection Time: 06/02/20  4:53 AM  Result Value Ref Range   Magnesium 2.2 1.7 - 2.4 mg/dL    Comment: Performed at [facility], [place] [address], [place], [place] [address]  Glucose, capillary     Status: Abnormal   Collection Time: 06/02/20 12:10 PM  Result Value Ref Range   Glucose-Capillary 199 (H) 70 - 99 mg/dL    Comment:  Glucose reference range applies only to samples taken after fasting for at least 8 hours.    Studies/Results: CT ABDOMEN PELVIS WO CONTRAST  Result Date: 06/02/2020 CLINICAL DATA:  History of pancreatic cancer with liver metastasis and ascites. Vomiting and weakness. Multiple recent paracentesis. Patient is deteriorating. To evaluate for perforation. EXAM: CT CHEST, ABDOMEN AND PELVIS WITHOUT CONTRAST TECHNIQUE: Multidetector CT imaging of the chest, abdomen and pelvis was performed following the standard protocol without IV contrast. COMPARISON:  CT abdomen and pelvis 05/30/2020.  CT chest 05/03/2020 FINDINGS: CT CHEST FINDINGS Cardiovascular: Normal heart size. No pericardial effusions. Normal caliber thoracic aorta. Scattered aortic and coronary artery calcifications. Right central venous catheter with tip in the low SVC. Mediastinum/Nodes: Esophagus is decompressed but fluid-filled, possibly indicating reflux or dysmotility. Pretracheal lymph node measures about 1.5 cm diameter. A paraesophageal lymph node at the abdominal hiatus measures about 1.9 cm diameter. Lymph nodes are similar in size to previous studies. Heterogeneous appearance of the thyroid gland with scattered calcifications. No significant nodularity. Lungs/Pleura: There is dense consolidation and airspace disease throughout the left lung and in the right lung base. Air bronchograms are present. Small pleural effusions. Changes could represent pneumonia or possibly aspiration. No pneumothorax. Musculoskeletal: Degenerative changes in the spine. No destructive bone lesions. CT ABDOMEN PELVIS FINDINGS Hepatobiliary: The gallbladder is distended with thickened wall. A biliary stent is present without bile duct dilatation. Pancreas: Mass in the head of the pancreas without interval enlargement. Porta hepatis and celiac axis lymph nodes are enlarged, likely metastatic. No pancreatic ductal dilatation. Spleen: Normal in size without focal  abnormality. Adrenals/Urinary Tract: 1.7 cm diameter left adrenal gland nodule. Additional 1.3 cm left adrenal gland nodule. No change in appearance. Low-attenuation suggests fat containing adenomas. Pelvic location of the right kidney. Kidneys appear otherwise symmetrical. No hydronephrosis. No stones. Bladder is unremarkable. Stomach/Bowel: Stomach, small bowel, and colon are not abnormally distended. Contrast material fills the colon. No free air or free contrast to suggest bowel perforation. Vascular/Lymphatic: Calcification of the aorta. No aneurysm. Retroperitoneal lymph nodes in the upper abdomen are mildly enlarged, similar prior study, likely metastatic. Reproductive: Prostate gland is enlarged. Other: Diffuse free fluid throughout the abdomen and pelvis, likely ascites. No free air. Edema in the subcutaneous fat. Musculoskeletal: No destructive bone lesions. IMPRESSION: 1. Dense consolidation and airspace disease throughout the left lung and in the right lung base. Small pleural effusions. Changes likely to represent pneumonia or possibly aspiration. 2. Fluid-filled esophagus, possibly indicating reflux or dysmotility. 3. Mass in the head of the pancreas without interval enlargement. 4. Porta hepatis, celiac axis, and retroperitoneal lymph nodes are enlarged, likely metastatic. 5. Diffuse free fluid throughout the abdomen and pelvis, likely ascites. 6. Bilateral adrenal gland nodules, likely metastatic. 7. Pelvic location of the right kidney. 8. Enlarged prostate gland. 9. Aortic atherosclerosis. 10. No evidence of bowel obstruction or perforation. Aortic Atherosclerosis (ICD10-I70.0). Electronically Signed   By:  Lucienne Capers M.D.   On: 06/02/2020 02:45   CT CHEST WO CONTRAST  Result Date: 06/02/2020 CLINICAL DATA:  History of pancreatic cancer with liver metastasis and ascites. Vomiting and weakness. Multiple recent paracentesis. Patient is deteriorating. To evaluate for perforation. EXAM: CT  CHEST, ABDOMEN AND PELVIS WITHOUT CONTRAST TECHNIQUE: Multidetector CT imaging of the chest, abdomen and pelvis was performed following the standard protocol without IV contrast. COMPARISON:  CT abdomen and pelvis 05/30/2020.  CT chest 05/03/2020 FINDINGS: CT CHEST FINDINGS Cardiovascular: Normal heart size. No pericardial effusions. Normal caliber thoracic aorta. Scattered aortic and coronary artery calcifications. Right central venous catheter with tip in the low SVC. Mediastinum/Nodes: Esophagus is decompressed but fluid-filled, possibly indicating reflux or dysmotility. Pretracheal lymph node measures about 1.5 cm diameter. A paraesophageal lymph node at the abdominal hiatus measures about 1.9 cm diameter. Lymph nodes are similar in size to previous studies. Heterogeneous appearance of the thyroid gland with scattered calcifications. No significant nodularity. Lungs/Pleura: There is dense consolidation and airspace disease throughout the left lung and in the right lung base. Air bronchograms are present. Small pleural effusions. Changes could represent pneumonia or possibly aspiration. No pneumothorax. Musculoskeletal: Degenerative changes in the spine. No destructive bone lesions. CT ABDOMEN PELVIS FINDINGS Hepatobiliary: The gallbladder is distended with thickened wall. A biliary stent is present without bile duct dilatation. Pancreas: Mass in the head of the pancreas without interval enlargement. Porta hepatis and celiac axis lymph nodes are enlarged, likely metastatic. No pancreatic ductal dilatation. Spleen: Normal in size without focal abnormality. Adrenals/Urinary Tract: 1.7 cm diameter left adrenal gland nodule. Additional 1.3 cm left adrenal gland nodule. No change in appearance. Low-attenuation suggests fat containing adenomas. Pelvic location of the right kidney. Kidneys appear otherwise symmetrical. No hydronephrosis. No stones. Bladder is unremarkable. Stomach/Bowel: Stomach, small bowel, and colon  are not abnormally distended. Contrast material fills the colon. No free air or free contrast to suggest bowel perforation. Vascular/Lymphatic: Calcification of the aorta. No aneurysm. Retroperitoneal lymph nodes in the upper abdomen are mildly enlarged, similar prior study, likely metastatic. Reproductive: Prostate gland is enlarged. Other: Diffuse free fluid throughout the abdomen and pelvis, likely ascites. No free air. Edema in the subcutaneous fat. Musculoskeletal: No destructive bone lesions. IMPRESSION: 1. Dense consolidation and airspace disease throughout the left lung and in the right lung base. Small pleural effusions. Changes likely to represent pneumonia or possibly aspiration. 2. Fluid-filled esophagus, possibly indicating reflux or dysmotility. 3. Mass in the head of the pancreas without interval enlargement. 4. Porta hepatis, celiac axis, and retroperitoneal lymph nodes are enlarged, likely metastatic. 5. Diffuse free fluid throughout the abdomen and pelvis, likely ascites. 6. Bilateral adrenal gland nodules, likely metastatic. 7. Pelvic location of the right kidney. 8. Enlarged prostate gland. 9. Aortic atherosclerosis. 10. No evidence of bowel obstruction or perforation. Aortic Atherosclerosis (ICD10-I70.0). Electronically Signed   By: Lucienne Capers M.D.   On: 06/02/2020 02:45   DG CHEST PORT 1 VIEW  Result Date: 06/01/2020 CLINICAL DATA:  Shortness of breath status post EGD EXAM: PORTABLE CHEST 1 VIEW COMPARISON:  05/30/2020, CT 05/03/2020 FINDINGS: Right-sided central venous port tip over the SVC. The right lung is grossly clear. Interim development of fairly extensive consolidation and ground-glass opacity throughout the left thorax. No pleural effusion. Stable cardiomediastinal silhouette. No pneumothorax. IMPRESSION: Development of fairly extensive consolidation and ground-glass opacity throughout the left thorax, findings could be secondary to asymmetric edema, pulmonary hemorrhage, or  diffuse pneumonia. Correlation with CT could be considered. Electronically  Signed   By: Donavan Foil M.D.   On: 06/01/2020 16:57    Medications: I have reviewed the patient's current medications.  Assessment: Metastatic pancreatic cancer and malignant ascites Complete duodenal obstruction noted on endoscopy yesterday Discussed imaging with radiologist, there is no lumen for [place] to consider placement of duodenal stent. Discussed with surgical team regarding placement of feeding jejunostomy and possible gastrostomy for venting  Patient became hypotensive, tachycardic, hypoxic, likely related to aspiration Over 1.7 L of fluid suctioned from the gastric cavity and esophagus on EGD yesterday  Labs revealed marked leukocytosis, mild thrombocytopenia, hyponatremia, acidosis, low total protein and albumin of 5.1 and 2.6 respectively CT showed dense consolidation and airspace disease throughout left lung and right lung base with small pleural effusion, changes related to pneumonia/aspiration with fluid-filled esophagus No evidence of perforation Hemoglobin remained stable at 12  Plan: Patient has a very poor prognosis. Discussed this with the patient, his wife at bedside, patient's hospitalist Dr. Grandville Silos. At this point, patient and the primary team/oncologist need to decide whether to pursue aggressive management with a feeding jejunostomy and a venting gastrostomy placement versus palliative care/comfort measures. Please recall GI if needed.  Ronnette Juniper, MD 06/02/2020, 12:54 PM

## 2020-06-02 NOTE — TOC Initial Note (Signed)
Transition of Care [facility]) - Initial/Assessment Note    Patient Details  Name: Jeffrey Harris MRN: [id number] Date of Birth: [birth year]  Transition of Care [facility]) CM/SW Contact:    Leeroy Cha, RN Phone Number: 06/02/2020, 7:04 AM  Clinical Narrative:                 Transferred to icu on [id number] pm due to shock , had endo showing a large obstructing mass in the duodenum.  A. Lines inserted due to hypotension, iov pressors started, Iv maxipime , iv thorazine started, on 6l [place] Two/min, iv ns with 40KCL at 100cc/hr,wbc =25.9, na 128, anion gap -17, bld glucose 184. Lives with wife. Plan follow for progression at this time Hopeful return to home once stabilized and decisions for duodenal mass explored.  Expected Discharge Plan: [place] Barriers to Discharge: Barriers Unresolved (comment) (continued shock and treatment)   Patient Goals and CMS Choice Patient states their goals for this hospitalization and ongoing recovery are:: unable to state CMS Medicare.gov Compare Post Acute Care list provided to:: Patient    Expected Discharge Plan and Services Expected Discharge Plan: [place]   Discharge Planning Services: CM Consult   Living arrangements for the past 2 months: Single Family Home                                      Prior Living Arrangements/Services Living arrangements for the past 2 months: Single Family Home Lives with:: Spouse Patient language and need for interpreter reviewed:: Yes Do you feel safe going back to the place where you live?:  (unable to state at this time)      Need for Family Participation in Patient Care: Yes (Comment) Care giver support system in place?: Yes (comment)   Criminal Activity/Legal Involvement Pertinent to Current Situation/Hospitalization: No - Comment as needed  Activities of Daily Living Home Assistive Devices/Equipment: Eyeglasses ADL Screening (condition at time of  admission) Patient's cognitive ability adequate to safely complete daily activities?: Yes Is the patient deaf or have difficulty hearing?: No Does the patient have difficulty seeing, even when wearing glasses/contacts?: No Does the patient have difficulty concentrating, remembering, or making decisions?: No Patient able to express need for assistance with ADLs?: Yes Does the patient have difficulty dressing or bathing?: No Independently performs ADLs?: Yes (appropriate for developmental age) Does the patient have difficulty walking or climbing stairs?: Yes Weakness of Legs: Both Weakness of Arms/Hands: None  Permission Sought/Granted                  Emotional Assessment Appearance:: Appears stated age Attitude/Demeanor/Rapport: Unable to Assess Affect (typically observed): Unable to Assess Orientation: : Fluctuating Orientation (Suspected and/or reported Sundowners) Alcohol / Substance Use: Not Applicable Psych Involvement: No (comment)  Admission diagnosis:  Alkalosis [E87.3] Hypokalemia [E87.6] Hypochloremia [E87.8] Nausea [R11.0] Hypoxia [R09.02] Chemotherapy-induced nausea and vomiting [R11.2, T45.1X5A] Intractable nausea and vomiting [R11.2] Hypotension [I95.9] Patient Active Problem List   Diagnosis Date Noted  . Severe sepsis ([place]) 06/01/2020  . Hypochloremia   . 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 Severe sepsis with septic shock ([place])   . Intractable nausea and vomiting 05/30/2020  . Dehydration with hyponatremia 05/30/2020  . Hypokalemia due to excessive gastrointestinal loss of potassium 05/30/2020  . Malignant ascites 05/30/2020  . Prolonged QT interval 05/30/2020  . Hypotension 05/30/2020  .  Drug-induced neutropenia ([place]) 03/10/2020  . Genetic testing 01/01/2020  . Goals of care, counseling/discussion 07/29/2019  . Pancreatic cancer metastasized to liver ([place]) 07/29/2019  . Hyponatremia   . Elevated LFTs   .  Jaundice   . Obstructive jaundice   . Hyperbilirubinemia 07/23/2019  . Elevated liver enzymes 07/23/2019  . History of gout 07/23/2019  . Obesity (BMI 30-39.9) 07/23/2019   PCP:  [name], MD Pharmacy:   [id number] - [place], [address] [place] [address] Phone: [phone] Fax: [phone]     Social Determinants of Health (SDOH) Interventions    Readmission Risk Interventions No flowsheet data found.

## 2020-06-02 NOTE — Progress Notes (Signed)
PT Cancellation Note  Patient Details Name: Jeffrey Harris MRN: [id number] DOB: March 04, 1956   Cancelled Treatment:    Reason Eval/Treat Not Completed: Medical issues which prohibited therapy, on pressers. Will check back another day.   Claretha Cooper 06/02/2020, 8:22 AM  [place] Pager [phone] Office [phone]

## 2020-06-02 NOTE — Progress Notes (Addendum)
NAME:  Jeffrey Harris, MRN:  [id number], DOB:  15-Jul-1956, LOS: 3 ADMISSION DATE:  05/29/2020, CONSULTATION DATE:  06/01/2020 REFERRING MD:  Dr Grandville Silos, CHIEF COMPLAINT:  shock   Brief History   Asked to see patient for shock, unresponsive to fluids Had endoscopy today showing a large obstructing duodenal mass  Patient with metastatic pancreatic cancer Carcinomatosis abdominis Found to have an obstructing pancreatic mass  Diagnosed with pancreatic cancer-diagnosed November 2020, has had 1 course of chemotherapy-last chemo 05/10/2020, scheduled for next course with Dr. Marin Olp  Past Medical History   Past Medical History:  Diagnosis Date  . Goals of care, counseling/discussion 07/29/2019  . Gout   . Hyperbilirubinemia 07/2019  . Pancreatic cancer metastasized to liver ([place]) 07/29/2019   Significant Hospital Events   Hypotension, altered mental status S/p EGD 06/01/2020->PCCM asked to see when as found altered w/ hypotension 9/30 still on pressors. Still on high FIO2. Added stress dose steroids Consults:  PCCM GI Surgery  Procedures:  EGD 06/01/2020 CT chest: 1. Dense consolidation and airspace disease throughout the left lung and in the right lung base. Small pleural effusions. Changes likely to represent pneumonia or possibly aspiration.2. Fluid-filled esophagus, possibly indicating reflux or dysmotility. 3. Mass in the head of the pancreas without interval enlargement. 4. Porta hepatis, celiac axis, and retroperitoneal lymph nodes are enlarged, likely metastatic.5. Diffuse free fluid throughout the abdomen and pelvis, likely ascites.6. Bilateral adrenal gland nodules, likely metastatic.7. Pelvic location of the right kidney.8. Enlarged prostate gland. 9. Aortic atherosclerosis.10. No evidence of bowel obstruction or perforation. CT abd/pelvis: 1. Dense consolidation and airspace disease throughout the left lung and in the right lung base. Small pleural effusions. Changes  likely to represent pneumonia or possibly aspiration. 2. Fluid-filled esophagus, possibly indicating reflux or dysmotility. 3. Mass in the head of the pancreas without interval enlargement. 4. Porta hepatis, celiac axis, and retroperitoneal lymph nodes are enlarged, likely metastatic.5. Diffuse free fluid throughout the abdomen and pelvis, likely ascites.6. Bilateral adrenal gland nodules, likely metastatic.7. Pelvic location of the right kidney.8. Enlarged prostate gland. 9. Aortic atherosclerosis.10. No evidence of bowel obstruction or perforation.  Significant Diagnostic Tests:  Chest x-ray with extensive consolidation left lung field  Micro Data:  None Blood culture 06/01/2020 Sputum culture 06/01/2020 Antimicrobials:  None  Interim history/subjective:   Seems withdrawn, frustrated, wants to drink but knows he cannot.  Denies shortness of breath or chest discomfort but does have a cough when he turns from side to side Objective   Blood pressure 104/64, pulse 81, temperature 98.1 F (36.7 C), temperature source Axillary, resp. rate 20, height 5\' 11"  (1.803 m), weight 78 kg, SpO2 96 %.        Intake/Output Summary (Last 24 hours) at 06/02/2020 0827 Last data filed at 06/02/2020 0355 Gross per 24 hour  Intake 5995.42 ml  Output 0 ml  Net 5995.42 ml   Filed Weights   05/31/20 0232 06/01/20 1128  Weight: 78 kg 78 kg    Examination: General: This is a chronically ill-appearing 64 year old white male he is currently on both high flow supplemental oxygen via nonrebreather mask and high flow nasal device HEENT normocephalic atraumatic mucous membranes are moist no JVD Pulmonary: Decreased bilaterally, no accessory use currently 100% nonrebreather and 15 L via salter Cardiac: Regular rate and rhythm Abdomen: Nontender Extremities: Warm and dry with brisk capillary refill Neuro: Awake oriented no focal deficits appreciated  Resolved Hospital Problem list     Assessment &  Plan:  Septic  shock in setting of aspiration pneumonia ->LA cleared ->leukocytosis improving ->pressor requirements: Plan Day 2 cefepime and vanc  Keep euvolemic at this point  Keep NPO  Keep euvolemic at this point continuing maintenance IV fluids Continue to wean vasoactive drips, will discontinue phenylephrine, and think we can continue to wean norepinephrine for mean arterial pressure greater than 65.  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 Marin Olp -Does have malignant ascites with 3 recent paracentesis Plan NPO Will need extensive goals of care as looks like not surgical candidate in which case would need J tube On going d/w palliative and onc  Fluid and electrolyte imbalance: hyponatremia (chronic), anion gap acidosis (clearing lactic acidosis) Plan Continuing maintenance IV fluids with normal saline Repeat lactic acid today A.m. chemistry  Hyperglycemia Plan Check every 4 blood sugars, goal 140-180, if consistently greater we will need to add sliding scale insulin  Thrombocytopenia Plan Trend cbc  Best practice:  Diet: N.p.o. Pain/Anxiety/Delirium protocol (if indicated): Per primary VAP protocol (if indicated): Not indicated DVT prophylaxis: Lovenox GI prophylaxis: Protonix Glucose control:  Mobility: Bedrest Code Status: Full code Family Communication: Discussed with spouse Disposition: ICU, continue to wean pressors.  Unfortunately little for here after treating pneumonia.  Seems like only option is going to be alternative route of feeding.  Will defer that discussion to oncology and  palliative care  My critical care time 32 minutes [name] ACNP-BC [place] Pager # [phone] OR # [phone] if no answer

## 2020-06-02 NOTE — Procedures (Signed)
Arterial Catheter Insertion Procedure Note  Jeffrey Harris  [id number]  08-Sep-1955  Date:06/02/20  Time:2:26 AM    Provider Performing: Winferd Humphrey    Procedure: Insertion of Arterial Line [phone]) without US guidance  Indication(s) Blood pressure monitoring and/or need for frequent ABGs  Consent Risks of the procedure as well as the alternatives and risks of each were explained to the patient and/or caregiver.  Consent for the procedure was obtained and is signed in the bedside chart  Anesthesia None   Time Out Verified patient identification, verified procedure, site/side was marked, verified correct patient position, special equipment/implants available, medications/allergies/relevant history reviewed, required imaging and test results available.   Sterile Technique Maximal sterile technique including full sterile barrier drape, hand hygiene, sterile gown, sterile gloves, mask, hair covering, sterile ultrasound probe cover (if used).   Procedure Description Area of catheter insertion was cleaned with chlorhexidine and draped in sterile fashion. With real-time ultrasound guidance an arterial catheter was placed into the left radial artery.  Appropriate arterial tracings confirmed on monitor.     Complications/Tolerance None; patient tolerated the procedure well.   EBL Minimal   Specimen(s) None

## 2020-06-02 NOTE — Progress Notes (Signed)
The events of yesterday are noted.  Unfortunately, looks like he may have had some aspiration with the upper endoscopy.  It looks like he has an obstruction in the duodenum.  I have to believe this is from his malignancy.  He is now extubated.  He does have the facemask oxygen on.  He is on pressor agents.  This is a real tough problem that we have now.  He has obstruction of the upper duodenum.  It does not sound like this is something that can be surgically corrected (i.e. surgical bypass).  I would hate that he has have a tubing to him.  I am not sure that he would want this.  I will have to talk to him about this.  Currently, he is sedated.  This is a good thing for him.  His labs shows white count to be 26.  Hemoglobin 12.  Platelet count 101,000.  His BUN is 15 creatinine 1.0.  Calcium 7 with an albumin of 2.6.  Sodium 128 and potassium 3.9.  Again, I just hate the fact that he has this obstruction.  This would certainly explain his symptoms.  I am surprised because his CA 19-9 had been coming down.  We will have to see how he recovers from this.  It is clearly possible that further chemotherapy may not be able to be given if his performance status is not that good.  Again I will have to talk to him about whether or not he would like to have a tube placed for feeding in the jejunum.  We would be nice if this tumor could be resected and his intestines connected again so he could have regular food.  I very much appreciate everybody's help with Jeffrey Harris.  I know that he had the endoscopy.  He apparently had a lot of retained fluid in his stomach and upper duodenum.  We will continue to pray for him.  I know he will get outstanding care from all the staff in the ICU.  Jeffrey Haw, MD  Psalm [phone]

## 2020-06-02 NOTE — Progress Notes (Signed)
Daily Progress Note   Patient Name: Jeffrey Harris       Date: 06/02/2020 DOB: July 09, 1956  Age: 64 y.o. MRN#: [id number] Attending Physician: Jeffrey Filler, MD Primary Care Physician: Jeffrey Pretty, MD Admit Date: 05/29/2020  Reason for Consultation/Follow-up: Establishing goals of care  Subjective: Patient is awake alert.  His wife is at the bedside.  Patient is on high flow 15 L as well as nonrebreather mask.  Bedside RN will attempt to wean his oxygen today.  He is awake and alert.  Patient states he feels well rested for now, states he woke up at 9 AM today.  He states that he had a busy day yesterday.  Chart reviewed, discussed with patient and wife, see below.  Length of Stay: 3  Current Medications: Scheduled Meds:  . bisacodyl  10 mg Rectal Daily  . Chlorhexidine Gluconate Cloth  6 each Topical Daily  . enoxaparin (LOVENOX) injection  40 mg Subcutaneous Q24H  . hydrocortisone sod succinate (SOLU-CORTEF) inj  50 mg Intravenous Q6H  . ipratropium  0.5 mg Nebulization Q6H  . levalbuterol  0.63 mg Nebulization Q6H  . mouth rinse  15 mL Mouth Rinse BID  . pantoprazole (PROTONIX) IV  40 mg Intravenous Q12H  . sodium chloride flush  10-40 mL Intracatheter Q12H  . sodium chloride HYPERTONIC  4 mL Nebulization Daily    Continuous Infusions: . sodium chloride Stopped (06/02/20 0530)  . sodium chloride    . ceFEPime (MAXIPIME) IV Stopped (06/02/20 0844)  . chlorproMAZINE (THORAZINE) IV Stopped (06/02/20 0309)  . metoCLOPramide (REGLAN) injection Stopped (06/02/20 0504)  . norepinephrine (LEVOPHED) Adult infusion 40 mcg/min (06/02/20 0822)  . vancomycin Stopped (06/02/20 0714)  . vancomycin Stopped (06/01/20 1948)    PRN Meds: acetaminophen **OR** acetaminophen, alum & mag  hydroxide-simeth, chlorproMAZINE (THORAZINE) IV, prochlorperazine, sodium chloride flush  Physical Exam         Awake alert resting in bed Patient has high O2 requirements both high flow supplemental oxygen as well as nonrebreather mask is present Diminished breath sounds Regular rate and rhythm Awake alert oriented Nonfocal  Vital Signs: BP 104/64   Pulse 81   Temp 98.1 F (36.7 C) (Axillary)   Resp 20   Ht 5\' 11"  (1.803 m)   Wt 78 kg   SpO2 96%  BMI 23.98 kg/m  SpO2: SpO2: 96 % O2 Device: O2 Device: Nasal Cannula, NRB O2 Flow Rate: O2 Flow Rate (L/min): 6 L/min (sats 87%)  Intake/output summary:   Intake/Output Summary (Last 24 hours) at 06/02/2020 1034 Last data filed at 06/02/2020 6967 Gross per 24 hour  Intake 5605.1 ml  Output 0 ml  Net 5605.1 ml   LBM: Last BM Date: 05/31/20 Baseline Weight: Weight: 78 kg Most recent weight: Weight: 78 kg       Palliative Assessment/Data:    Palliative performance scale 50%  Patient Active Problem List   Diagnosis Date Noted  . Severe sepsis ([place]) 06/01/2020  . Hypochloremia   . Pancreatic cancer metastasized to intra-abdominal lymph node ([place])   . Severe sepsis with septic shock ([place])   . Intractable nausea and vomiting 05/30/2020  . Dehydration with hyponatremia 05/30/2020  . Hypokalemia due to excessive gastrointestinal loss of potassium 05/30/2020  . Malignant ascites 05/30/2020  . Prolonged QT interval 05/30/2020  . Hypotension 05/30/2020  . Drug-induced neutropenia ([place]) 03/10/2020  . Genetic testing 01/01/2020  . Goals of care, counseling/discussion 07/29/2019  . Pancreatic cancer metastasized to liver ([place]) 07/29/2019  . Hyponatremia   . Elevated LFTs   . Jaundice   . Obstructive jaundice   . Hyperbilirubinemia 07/23/2019  . Elevated liver enzymes 07/23/2019  . History of gout 07/23/2019  . Obesity (BMI 30-39.9) 07/23/2019    Palliative Care Assessment & Plan   Patient Profile:     Assessment: 64 year old gentleman with life limiting illness of metastatic pancreatic cancer, obstructing duodenal mass Acute hypoxic respiratory failure aspiration pneumonia   Recommendations/Plan:  Briefly reviewed with wife and patient about events that took place yesterday.  Briefly reviewed about EGD findings.  Discussed about surgical team opining about G-tube and J-tube.  Discussed about his high O2 needs as of now.  Offered assistance and supportive care.  Patient states that he might have been sleeping at the time of Dr. Antonieta Harris visit early this morning.  Palliative available for additional support, ongoing goals of care discussions.  Anticipate further discussions between patient wife and medical oncology.  Goals of Care and Additional Recommendations:  Limitations on Scope of Treatment: Full Scope Treatment  Code Status:    Code Status Orders  (From admission, onward)         Start     Ordered   05/30/20 0638  Full code  Continuous        05/30/20 0644        Code Status History    Date Active Date Inactive Code Status Order ID Comments User Context   07/23/2019 1501 07/29/2019 2021 Full Code [id number]  Jeffrey Morton, MD ED   Advance Care Planning Activity       Prognosis:   Unable to determine Guarded.  Discharge Planning:  To Be Determined Monitor hospital course and overall disease trajectory of illness to help guide disposition planning.   Care plan was discussed with  Patient wife RN.  Also corresponded via secure chat with Dr Jeffrey Harris.   Thank you for allowing the Palliative Medicine Team to assist in the care of this patient.   Time In: 9 Time Out: 9.25 Total Time 25 Prolonged Time Billed  no       Greater than 50%  of this time was spent counseling and coordinating care related to the above assessment and plan.  Jeffrey Chance, MD  Please contact Palliative Medicine Team phone at [phone] for questions and  concerns.

## 2020-06-03 ENCOUNTER — Encounter: Payer: Self-pay | Admitting: Hematology & Oncology

## 2020-06-03 ENCOUNTER — Ambulatory Visit
Admit: 2020-06-03 | Discharge: 2020-06-03 | Disposition: A | Discharge status: Home / Self Care | Payer: BC Managed Care – PPO | Attending: Radiation Oncology | Admitting: Radiation Oncology

## 2020-06-03 ENCOUNTER — Inpatient Hospital Stay (HOSPITAL_COMMUNITY): Payer: BC Managed Care – PPO

## 2020-06-03 ENCOUNTER — Ambulatory Visit
Admit: 2020-06-03 | Discharge: 2020-06-03 | Disposition: A | Discharge status: Home / Self Care | Payer: BC Managed Care – PPO | Admitting: Radiation Oncology

## 2020-06-03 DIAGNOSIS — C787 Secondary malignant neoplasm of liver and intrahepatic bile duct: Secondary | ICD-10-CM

## 2020-06-03 DIAGNOSIS — E860 Dehydration: Secondary | ICD-10-CM | POA: Diagnosis not present

## 2020-06-03 DIAGNOSIS — J9601 Acute respiratory failure with hypoxia: Secondary | ICD-10-CM | POA: Diagnosis not present

## 2020-06-03 DIAGNOSIS — J690 Pneumonitis due to inhalation of food and vomit: Secondary | ICD-10-CM | POA: Diagnosis not present

## 2020-06-03 LAB — GLUCOSE, CAPILLARY
Glucose-Capillary: 120 mg/dL — ABNORMAL HIGH (ref 70–99)
Glucose-Capillary: 120 mg/dL — ABNORMAL HIGH (ref 70–99)
Glucose-Capillary: 127 mg/dL — ABNORMAL HIGH (ref 70–99)
Glucose-Capillary: 133 mg/dL — ABNORMAL HIGH (ref 70–99)
Glucose-Capillary: 147 mg/dL — ABNORMAL HIGH (ref 70–99)
Glucose-Capillary: 191 mg/dL — ABNORMAL HIGH (ref 70–99)

## 2020-06-03 LAB — URINE CULTURE: Culture: NO GROWTH

## 2020-06-03 LAB — CULTURE, RESPIRATORY W GRAM STAIN: Culture: NORMAL

## 2020-06-03 LAB — CBC WITH DIFFERENTIAL/PLATELET
Abs Immature Granulocytes: 0.29 10*3/uL — ABNORMAL HIGH (ref 0.00–0.07)
Basophils Absolute: 0.1 10*3/uL (ref 0.0–0.1)
Basophils Relative: 0 %
Eosinophils Absolute: 0.1 10*3/uL (ref 0.0–0.5)
Eosinophils Relative: 1 %
HCT: 30.4 % — ABNORMAL LOW (ref 39.0–52.0)
Hemoglobin: 10.6 g/dL — ABNORMAL LOW (ref 13.0–17.0)
Immature Granulocytes: 2 %
Lymphocytes Relative: 2 %
Lymphs Abs: 0.4 10*3/uL — ABNORMAL LOW (ref 0.7–4.0)
MCH: 33.9 pg (ref 26.0–34.0)
MCHC: 34.9 g/dL (ref 30.0–36.0)
MCV: 97.1 fL (ref 80.0–100.0)
Monocytes Absolute: 0.7 10*3/uL (ref 0.1–1.0)
Monocytes Relative: 3 %
Neutro Abs: 18.1 10*3/uL — ABNORMAL HIGH (ref 1.7–7.7)
Neutrophils Relative %: 92 %
Platelets: 90 10*3/uL — ABNORMAL LOW (ref 150–400)
RBC: 3.13 MIL/uL — ABNORMAL LOW (ref 4.22–5.81)
RDW: 19.6 % — ABNORMAL HIGH (ref 11.5–15.5)
WBC: 19.7 10*3/uL — ABNORMAL HIGH (ref 4.0–10.5)
nRBC: 0 % (ref 0.0–0.2)

## 2020-06-03 LAB — LACTIC ACID, PLASMA: Lactic Acid, Venous: 1.6 mmol/L (ref 0.5–1.9)

## 2020-06-03 LAB — COMPREHENSIVE METABOLIC PANEL
ALT: 23 U/L (ref 0–44)
AST: 35 U/L (ref 15–41)
Albumin: 2.4 g/dL — ABNORMAL LOW (ref 3.5–5.0)
Alkaline Phosphatase: 113 U/L (ref 38–126)
Anion gap: 14 (ref 5–15)
BUN: 13 mg/dL (ref 8–23)
CO2: 21 mmol/L — ABNORMAL LOW (ref 22–32)
Calcium: 7.1 mg/dL — ABNORMAL LOW (ref 8.9–10.3)
Chloride: 98 mmol/L (ref 98–111)
Creatinine, Ser: 0.58 mg/dL — ABNORMAL LOW (ref 0.61–1.24)
GFR calc Af Amer: >60 mL/min (ref >60)
GFR calc non Af Amer: >60 mL/min (ref >60)
Glucose, Bld: 187 mg/dL — ABNORMAL HIGH (ref 70–99)
Potassium: 3.3 mmol/L — ABNORMAL LOW (ref 3.5–5.1)
Sodium: 133 mmol/L — ABNORMAL LOW (ref 135–145)
Total Bilirubin: 1.9 mg/dL — ABNORMAL HIGH (ref 0.3–1.2)
Total Protein: 5.1 g/dL — ABNORMAL LOW (ref 6.5–8.1)

## 2020-06-03 LAB — PREALBUMIN: Prealbumin: <5 mg/dL — ABNORMAL LOW (ref 18–38)

## 2020-06-03 LAB — PROCALCITONIN: Procalcitonin: 23.32 ng/mL

## 2020-06-03 LAB — HEMOGLOBIN A1C
Hgb A1c MFr Bld: 5.3 % (ref 4.8–5.6)
Mean Plasma Glucose: 105.41 mg/dL

## 2020-06-03 MED ORDER — POTASSIUM CHLORIDE 10 MEQ/100ML IV SOLN
10.0000 meq | INTRAVENOUS | Status: AC
  Administered 2020-06-03: 10 meq via INTRAVENOUS
  Filled 2020-06-03: qty 100

## 2020-06-03 MED ORDER — HYDROCORTISONE NA SUCCINATE PF 100 MG IJ SOLR: 50.0000 mg | Freq: Three times a day (TID) | INTRAMUSCULAR | Status: DC

## 2020-06-03 MED ORDER — POTASSIUM CHLORIDE 10 MEQ/100ML IV SOLN
10.0000 meq | INTRAVENOUS | Status: AC
  Administered 2020-06-03 (×2): 10 meq via INTRAVENOUS
  Filled 2020-06-03 (×2): qty 100

## 2020-06-03 MED ORDER — HYDROCORTISONE NA SUCCINATE PF 100 MG IJ SOLR
50.0000 mg | Freq: Three times a day (TID) | INTRAMUSCULAR | Status: AC
  Administered 2020-06-03 (×2): 50 mg via INTRAVENOUS
  Filled 2020-06-03 (×2): qty 2

## 2020-06-03 MED ORDER — MELATONIN 3 MG PO TABS: 3.0000 mg | ORAL_TABLET | Freq: Every evening | ORAL | Status: DC | PRN

## 2020-06-03 MED ORDER — INSULIN ASPART 100 UNIT/ML ~~LOC~~ SOLN
0.0000 [IU] | SUBCUTANEOUS | Status: DC
  Administered 2020-06-03 – 2020-06-07 (×13): 1 [IU] via SUBCUTANEOUS
  Administered 2020-06-07 – 2020-06-08 (×2): 2 [IU] via SUBCUTANEOUS
  Administered 2020-06-08: 3 [IU] via SUBCUTANEOUS
  Administered 2020-06-08 (×2): 2 [IU] via SUBCUTANEOUS
  Administered 2020-06-08: 5 [IU] via SUBCUTANEOUS
  Administered 2020-06-08: 3 [IU] via SUBCUTANEOUS
  Administered 2020-06-09: 5 [IU] via SUBCUTANEOUS
  Administered 2020-06-09 (×3): 3 [IU] via SUBCUTANEOUS
  Administered 2020-06-09: 5 [IU] via SUBCUTANEOUS

## 2020-06-03 MED ORDER — DIPHENHYDRAMINE HCL 50 MG/ML IJ SOLN
12.5000 mg | Freq: Once | INTRAMUSCULAR | Status: AC
  Administered 2020-06-03: 12.5 mg via INTRAVENOUS
  Filled 2020-06-03: qty 1

## 2020-06-03 MED ORDER — POTASSIUM CHLORIDE 10 MEQ/100ML IV SOLN
10.0000 meq | INTRAVENOUS | Status: AC
  Administered 2020-06-03 (×2): 10 meq via INTRAVENOUS
  Filled 2020-06-03: qty 100

## 2020-06-03 NOTE — Progress Notes (Addendum)
Attempted X2 to place NGT. Unable to get tube in. Saverio Danker aware and ordering radiology to place.  Attempted by Dareen Piano RN on the floor as well as MD in radiology without success.

## 2020-06-03 NOTE — Progress Notes (Signed)
NAME:  Jeffrey Harris, MRN:  [id number], DOB:  07-08-1956, LOS: 4 ADMISSION DATE:  05/29/2020, CONSULTATION DATE:  06/01/2020 REFERRING MD:  Dr Grandville Silos, CHIEF COMPLAINT:  shock   Brief History   Asked to see patient for shock, unresponsive to fluids Had endoscopy today showing a large obstructing duodenal mass  Patient with metastatic pancreatic cancer Carcinomatosis abdominis Found to have an obstructing pancreatic mass  Diagnosed with pancreatic cancer-diagnosed November 2020, has had 1 course of chemotherapy-last chemo 05/10/2020, scheduled for next course with Dr. Marin Olp  Past Medical History   Past Medical History:  Diagnosis Date   Goals of care, counseling/discussion 07/29/2019   Gout    Hyperbilirubinemia 07/2019   Pancreatic cancer metastasized to liver ([place]) 07/29/2019   Significant Hospital Events   Hypotension, altered mental status S/p EGD 06/01/2020->PCCM asked to see when as found altered w/ hypotension 9/30 still on pressors. Still on high FIO2. Added stress dose steroids 10/1 oxygen and pressor requirements better Consults:  PCCM GI Surgery  Procedures:  EGD 06/01/2020 CT chest: 1. Dense consolidation and airspace disease throughout the left lung and in the right lung base. Small pleural effusions. Changes likely to represent pneumonia or possibly aspiration.2. Fluid-filled esophagus, possibly indicating reflux or dysmotility. 3. Mass in the head of the pancreas without interval enlargement. 4. Porta hepatis, celiac axis, and retroperitoneal lymph nodes are enlarged, likely metastatic.5. Diffuse free fluid throughout the abdomen and pelvis, likely ascites.6. Bilateral adrenal gland nodules, likely metastatic.7. Pelvic location of the right kidney.8. Enlarged prostate gland. 9. Aortic atherosclerosis.10. No evidence of bowel obstruction or perforation. CT abd/pelvis: 1. Dense consolidation and airspace disease throughout the left lung and in the right lung  base. Small pleural effusions. Changes likely to represent pneumonia or possibly aspiration. 2. Fluid-filled esophagus, possibly indicating reflux or dysmotility. 3. Mass in the head of the pancreas without interval enlargement. 4. Porta hepatis, celiac axis, and retroperitoneal lymph nodes are enlarged, likely metastatic.5. Diffuse free fluid throughout the abdomen and pelvis, likely ascites.6. Bilateral adrenal gland nodules, likely metastatic.7. Pelvic location of the right kidney.8. Enlarged prostate gland. 9. Aortic atherosclerosis.10. No evidence of bowel obstruction or perforation.  Significant Diagnostic Tests:  Chest x-ray with extensive consolidation left lung field  Micro Data:  None Blood culture 06/01/2020 Sputum culture 06/01/2020 Antimicrobials:  Cefepime 9/29>>> vanc x 1 day (9/28) Interim history/subjective:  Weaning pressors Weaning oxygen  Wants to get up Objective   Blood pressure (Abnormal) 160/85, pulse 87, temperature (Abnormal) 97.4 F (36.3 C), temperature source Oral, resp. rate (Abnormal) 25, height 5\' 11"  (1.803 m), weight 78 kg, SpO2 92 %.        Intake/Output Summary (Last 24 hours) at 06/03/2020 0818 Last data filed at 06/03/2020 0805 Gross per 24 hour  Intake 3321.14 ml  Output 1675 ml  Net 1646.14 ml   Filed Weights   05/31/20 0232 06/01/20 1128  Weight: 78 kg 78 kg    Examination:  General this is a 64 year old white male resting in bed. No distress HENT NCAT no JVD MMM pulm bilateral basilar rales. No accessory use. Now on 6 liters high-flow salter Card RRR no MRG.  abd not tender + bowel sounds Neuro intact but withdrawn  Ext warm and dry w/ brisk CR   Resolved Hospital Problem list    Lactic acidosis   Assessment & Plan:  Septic shock in setting of aspiration pneumonia ->LA cleared ->leukocytosis improving ->pressor requirements:better Plan Day 3 of 7 cefepime Keep euvolemic  Wean norepi to off Day 2 of 5 stress dose  steroids Trend PCT  Acute hypoxic respiratory failure in the setting of aspiration PNA  Has dense consolidation involving the left Lung and mild airspace disease right base Plan NPO Titrate FIO2 IS and HT saline neb Pulse ox   Metastatic pancreatic cancer w/ Obstructing duodenal mass -Recently on Abraxane and Gemzar-last chemo session 05/10/2020 -Patient of Dr. 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 Pain/Anxiety/Delirium protocol (if indicated): Per primary VAP protocol (if indicated): Not indicated DVT prophylaxis: Lovenox GI prophylaxis: Protonix Glucose control:  Mobility: Bedrest Code Status: Full code Family Communication: Discussed with spouse Disposition: ICU, weaning pressors. Oxygen better   My cct 32 minutes  [name] ACNP-BC [place] Pager # [phone] OR # [phone] if no answer

## 2020-06-03 NOTE — Progress Notes (Signed)
Radiation Oncology         [phone] ________________________________  Initial inpatient Consultation  Name: Jeffrey Harris MRN: [id number]  Date of Service: 05/29/2020 DOB: 22-May-1956  SE:GBTDV, Thayer Jew, MD  No ref. provider found   REFERRING PHYSICIAN:  Burney Gauze, MD  DIAGNOSIS: 64 y/o male with obstructing duodenal mass secondary to metastatic pancreatic cancer.    ICD-10-CM   1. Chemotherapy-induced nausea and vomiting  R11.2    T45.1X5A   2. Nausea  R11.0 DG ABD ACUTE 2+V W 1V CHEST    DG ABD ACUTE 2+V W 1V CHEST  3. Hypokalemia  E87.6   4. Hypochloremia  E87.8   5. Alkalosis  E87.3   6. Hypoxia  R09.02 DG CHEST PORT 1 VIEW    DG CHEST PORT 1 VIEW    DG CHEST PORT 1 VIEW    DG CHEST PORT 1 VIEW  7. Pancreatic cancer metastasized to intra-abdominal lymph node (HCC)  C25.9 DG UGI W SINGLE CM (SOL OR THIN BA)   C77.2 DG UGI W SINGLE CM (SOL OR THIN BA)  8. Malignant ascites  R18.0 DISCONTINUED: metroNIDAZOLE (FLAGYL) IVPB 500 mg  9. Intractable nausea and vomiting  R11.2 DISCONTINUED: metoCLOPramide (REGLAN) 20 mg in dextrose 5 % 50 mL IVPB    DISCONTINUED: OLANZapine (ZYPREXA) tablet 10 mg  10. Hypokalemia due to excessive gastrointestinal loss of potassium  E87.6   11. Pancreatic cancer metastasized to liver (HCC)  C25.9 hydrocortisone sodium succinate (SOLU-CORTEF) 100 MG injection 50 mg   C78.7 DISCONTINUED: hydrocortisone sodium succinate (SOLU-CORTEF) 100 MG injection 50 mg  12. Duodenal obstruction  K31.5 DG Abd Portable 1V    DG Abd Portable 1V  13. Gastric distention  K31.89 CANCELED: DG INTRO LONG GI TUBE    CANCELED: DG INTRO LONG GI TUBE  14. Encounter for feeding tube placement  Z46.59 DG Naso G Tube Plc W/Fl W/Rad    DG Naso G Tube Plc W/Fl W/Rad    CANCELED: DG INTRO LONG GI TUBE    CANCELED: DG INTRO LONG GI TUBE    HISTORY OF PRESENT ILLNESS: Jeffrey Harris is a 64 y.o. male seen at the request of Dr. Marin Olp.  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  He recently presented to the emergency department on 05/29/2020 with generalized weakness, lightheadedness upon standing and intractable nausea and vomiting which he reports has been present and progressively worsening over the past 4 weeks.  On admission, the patient was found to be significantly hypovolemic with hyponatremia and hypokalemia.  The patient had been on systemic chemotherapy with Gemzar and Arbaxane, under the care and direction of Dr. Marin Olp, with his most recent treatment given on 05/10/2020.  He developed malignant ascites with rapid reaccumulation of fluid requiring 3 paracentesis procedures since 05/06/2020.  With a confirmation of malignant ascites indicating disease progression, the plan was to change his systemic therapy to liposomal Irinotecan/leucovorin/5-FU with his first dose scheduled this week on 06/01/2020.  In the interim, he presented to the emergency department and has been admitted for further work up and fluid resuscitation.  CT A/P performed on admission showed persistent enlargement of the pancreatic head with a distal common bile duct stent in place.  There was interval decrease in the severity of air within the right lobe of the liver and gallbladder and a mild amount of perihepatic and perisplenic fluid, decreased in severity when compared to prior exam from 05/03/2020.  However, there was diffuse mesenteric inflammatory fat stranding noted.  He had an upper GI series performed on 05/31/2020 showing moderate to marked partial duodenal obstruction at the level of the pancreatic tumor with stent in place.  Only a small amount of contrast past beyond this area, resulting in marked gastric distention.  GI was consulted and the recommendation was to proceed with EGD which was performed on 06/01/2020 and demonstrated a large, obstructing submucosal mass at the first part of the duodenum.  Unfortunately, the patient  developed acute septic shock shortly thereafter, felt secondary to aspiration pneumonia requiring transfer to the ICU.[name]  The duodenal mass was not felt to be amicable to surgery so we have been asked to consult for consideration of palliative radiation to shrink the mass and allow him to eat normally again, in an effort to improve and maintain his quality of life.  Dr. Marin Olp is also considering the use of low-dose systemic therapy concurrent with the radiation to increase the efficacy of treatment.  PREVIOUS RADIATION THERAPY: No  PAST MEDICAL HISTORY:  Past Medical History:  Diagnosis Date  . Goals of care, counseling/discussion 07/29/2019  . Gout   . Hyperbilirubinemia 07/2019  . Pancreatic cancer metastasized to liver [facility]) 07/29/2019      PAST SURGICAL HISTORY: Past Surgical History:  Procedure Laterality Date  . BIOPSY  07/25/2019   Procedure: BIOPSY;  Surgeon: Ronnette Juniper, MD;  Location: [facility] ENDOSCOPY;  Service: Gastroenterology;;  . ESOPHAGOGASTRODUODENOSCOPY  07/25/2019   Procedure: ESOPHAGOGASTRODUODENOSCOPY (EGD);  Surgeon: Ronnette Juniper, MD;  Location: [facility] ENDOSCOPY;  Service: Gastroenterology;;  . ESOPHAGOGASTRODUODENOSCOPY (EGD) WITH PROPOFOL N/A 06/01/2020   Procedure: ESOPHAGOGASTRODUODENOSCOPY (EGD) WITH PROPOFOL;  Surgeon: Ronnette Juniper, MD;  Location: [facility] ENDOSCOPY;  Service: Gastroenterology;  Laterality: N/A;  . IR BILIARY STENT(S) EXISTING ACCESS INC DILATION CATH EXCHANGE  08/27/2019  . IR CHOLANGIOGRAM EXISTING TUBE  09/01/2019  . IR CONVERT BILIARY DRAIN TO INT EXT BILIARY DRAIN  08/27/2019  . IR IMAGING GUIDED PORT INSERTION  08/06/2019  . IR INT EXT BILIARY DRAIN WITH CHOLANGIOGRAM  07/26/2019  . IR PARACENTESIS  05/16/2020  . IR RADIOLOGIST EVAL & MGMT  08/05/2019  . IR REMOVAL BILIARY DRAIN  09/01/2019  . KNEE ARTHROSCOPY      FAMILY HISTORY:  Family History  Problem Relation Age of Onset  . Diabetes Father     SOCIAL HISTORY:  Social History    Socioeconomic History  . Marital status: Married    Spouse name: Not on file  . Number of children: Not on file  . Years of education: Not on file  . Highest education level: Not on file  Occupational History  . Not on file  Tobacco Use  . Smoking status: Never Smoker  . Smokeless tobacco: Former Systems developer    Types: Snuff  Vaping Use  . Vaping Use: Never used  Substance and Sexual Activity  . Alcohol use: No  . Drug use: No  . Sexual activity: Not Currently  Other Topics Concern  . Not on file  Social History Narrative  . Not on file   Social Determinants of Health   Financial Resource Strain:   . Difficulty of Paying Living Expenses: Not on file  Food Insecurity:   . Worried About Charity fundraiser in the Last Year: Not on file  . Ran Out of Food in the Last Year: Not on file  Transportation Needs:   . Lack of Transportation (Medical): Not on file  . Lack of  Transportation (Non-Medical): Not on file  Physical Activity:   . Days of Exercise per Week: Not on file  . Minutes of Exercise per Session: Not on file  Stress:   . Feeling of Stress : Not on file  Social Connections:   . Frequency of Communication with Friends and Family: Not on file  . Frequency of Social Gatherings with Friends and Family: Not on file  . Attends Religious Services: Not on file  . Active Member of Clubs or Organizations: Not on file  . Attends Archivist Meetings: Not on file  . Marital Status: Not on file  Intimate Partner Violence:   . Fear of Current or Ex-Partner: Not on file  . Emotionally Abused: Not on file  . Physically Abused: Not on file  . Sexually Abused: Not on file    ALLERGIES: Patient has no known allergies.  MEDICATIONS:  Current Facility-Administered Medications  Medication Dose Route Frequency Provider Last Rate Last Admin  . 0.9 %  sodium chloride infusion   Intravenous Continuous Eugenie Filler, MD   Stopped at 06/03/20 1626  . 0.9 %  sodium  chloride infusion  250 mL Intravenous Continuous Eugenie Filler, MD      . acetaminophen (TYLENOL) tablet 650 mg  650 mg Oral Q6H PRN Ronnette Juniper, MD       Or  . acetaminophen (TYLENOL) suppository 650 mg  650 mg Rectal Q6H PRN Ronnette Juniper, MD      . alum & mag hydroxide-simeth (MAALOX/MYLANTA) 200-200-20 MG/5ML suspension 30 mL  30 mL Oral Q4H PRN Ronnette Juniper, MD   30 mL at 05/31/20 0249  . bisacodyl (DULCOLAX) suppository 10 mg  10 mg Rectal Daily Ronnette Juniper, MD      . ceFEPIme (MAXIPIME) 2 g in sodium chloride 0.9 % 100 mL IVPB  2 g Intravenous Q8H Olalere, Adewale A, MD 200 mL/hr at 06/03/20 1626 2 g at 06/03/20 1626  . Chlorhexidine Gluconate Cloth 2 % PADS 6 each  6 each Topical Daily Ronnette Juniper, MD   6 each at 06/03/20 1348  . chlorproMAZINE (THORAZINE) 25 mg in sodium chloride 0.9 % 25 mL IVPB  25 mg Intravenous Q6H PRN Eugenie Filler, MD   Stopped at 06/02/20 0309  . enoxaparin (LOVENOX) injection 40 mg  40 mg Subcutaneous Q24H Ronnette Juniper, MD   40 mg at 06/03/20 0927  . hydrocortisone sodium succinate (SOLU-CORTEF) 100 MG injection 50 mg  50 mg Intravenous Q8H Olalere, Adewale A, MD   50 mg at 06/03/20 1329  . insulin aspart (novoLOG) injection 0-9 Units  0-9 Units Subcutaneous Q4H [name], NP   1 Units at 06/03/20 1348  . ipratropium (ATROVENT) nebulizer solution 0.5 mg  0.5 mg Nebulization Q6H Eugenie Filler, MD   0.5 mg at 06/03/20 1411  . levalbuterol (XOPENEX) nebulizer solution 0.63 mg  0.63 mg Nebulization Q6H Eugenie Filler, MD   0.63 mg at 06/03/20 1412  . MEDLINE mouth rinse  15 mL Mouth Rinse BID Eugenie Filler, MD   15 mL at 06/02/20 2138  . norepinephrine (LEVOPHED) 16 mg in 21mL premix infusion  0-40 mcg/min Intravenous Titrated Olalere, Adewale A, MD 8.44 mL/hr at 06/03/20 1626 9 mcg/min at 06/03/20 1626  . pantoprazole (PROTONIX) injection 40 mg  40 mg Intravenous Q12H Ronnette Juniper, MD   40 mg at 06/03/20 0927  . prochlorperazine (COMPAZINE)  injection 10 mg  10 mg Intravenous Q6H PRN Ronnette Juniper, MD      .  sodium chloride flush (NS) 0.9 % injection 10-40 mL  10-40 mL Intracatheter Q12H Ronnette Juniper, MD   10 mL at 06/02/20 2139  . sodium chloride flush (NS) 0.9 % injection 10-40 mL  10-40 mL Intracatheter PRN Ronnette Juniper, MD   10 mL at 06/01/20 0353  . sodium chloride HYPERTONIC 3 % nebulizer solution 4 mL  4 mL Nebulization Daily [name], NP   4 mL at 06/03/20 9798   Facility-Administered Medications Ordered in Other Encounters  Medication Dose Route Frequency Provider Last Rate Last Admin  . sodium chloride flush (NS) 0.9 % injection 10 mL  10 mL Intravenous PRN Volanda Napoleon, MD   10 mL at 09/07/19 1245    REVIEW OF SYSTEMS:  On review of systems, the patient reports that he is doing fair overall. He denies any chest pain, shortness of breath, cough, fevers, chills, or night sweats. He has had substantial unintended weight loss due to persistent N/V and more recently developed associated generalized weakness and lightheadedness upon standing. He denies any bowel or bladder disturbances, and denies abdominal pain. He denies any new musculoskeletal or joint aches or pains. A complete review of systems is obtained and is otherwise negative.    PHYSICAL EXAM:  Wt Readings from Last 3 Encounters:  06/01/20 171 lb 15.3 oz (78 kg)  05/17/20 175 lb (79.4 kg)  05/10/20 188 lb 1.9 oz (85.3 kg)   Temp Readings from Last 3 Encounters:  06/03/20 (!) 97.5 F (36.4 C) (Oral)  05/18/20 97.6 F (36.4 C) (Oral)  05/17/20 97.7 F (36.5 C) (Oral)   BP Readings from Last 3 Encounters:  06/03/20 116/64  05/23/20 99/73  05/18/20 105/72   Pulse Readings from Last 3 Encounters:  06/03/20 67  05/18/20 76  05/17/20 60   Pain Assessment Pain Score: 0-No pain/10  In general this is an ill appearing Caucasian male in no acute distress. He is alert and oriented x4 and appropriate throughout the examination. HEENT reveals that the  patient is normocephalic, atraumatic. EOMs are intact. PERRLA. Skin is intact without any evidence of gross lesions. Cardiopulmonary assessment is negative for acute distress and he exhibits normal effort. The abdomen is soft, non tender, non distended. Lower extremities are negative for pretibial pitting edema, deep calf tenderness, cyanosis or clubbing.   KPS = 30  100 - Normal; no complaints; no evidence of disease. 90   - Able to carry on normal activity; minor signs or symptoms of disease. 80   - Normal activity with effort; some signs or symptoms of disease. 61   - Cares for self; unable to carry on normal activity or to do active work. 60   - Requires occasional assistance, but is able to care for most of his personal needs. 50   - Requires considerable assistance and frequent medical care. 33   - Disabled; requires special care and assistance. 84   - Severely disabled; hospital admission is indicated although death not imminent. 35   - Very sick; hospital admission necessary; active supportive treatment necessary. 10   - Moribund; fatal processes progressing rapidly. 0     - Dead  Karnofsky DA, Abelmann WH, Craver LS and Burchenal [facility] [phone]) The use of the nitrogen mustards in the palliative treatment of carcinoma: with particular reference to bronchogenic carcinoma Cancer 1 634-56  LABORATORY DATA:  Lab Results  Component Value Date   WBC 19.7 (H) 06/03/2020   HGB 10.6 (L) 06/03/2020   HCT 30.4 (  L) 06/03/2020   MCV 97.1 06/03/2020   PLT 90 (L) 06/03/2020   Lab Results  Component Value Date   NA 133 (L) 06/03/2020   K 3.3 (L) 06/03/2020   CL 98 06/03/2020   CO2 21 (L) 06/03/2020   Lab Results  Component Value Date   ALT 23 06/03/2020   AST 35 06/03/2020   ALKPHOS 113 06/03/2020   BILITOT 1.9 (H) 06/03/2020     RADIOGRAPHY: CT ABDOMEN PELVIS WO CONTRAST  Result Date: 06/02/2020 CLINICAL DATA:  History of pancreatic cancer with liver metastasis and ascites. Vomiting  and weakness. Multiple recent paracentesis. Patient is deteriorating. To evaluate for perforation. EXAM: CT CHEST, ABDOMEN AND PELVIS WITHOUT CONTRAST TECHNIQUE: Multidetector CT imaging of the chest, abdomen and pelvis was performed following the standard protocol without IV contrast. COMPARISON:  CT abdomen and pelvis 05/30/2020.  CT chest 05/03/2020 FINDINGS: CT CHEST FINDINGS Cardiovascular: Normal heart size. No pericardial effusions. Normal caliber thoracic aorta. Scattered aortic and coronary artery calcifications. Right central venous catheter with tip in the low SVC. Mediastinum/Nodes: Esophagus is decompressed but fluid-filled, possibly indicating reflux or dysmotility. Pretracheal lymph node measures about 1.5 cm diameter. A paraesophageal lymph node at the abdominal hiatus measures about 1.9 cm diameter. Lymph nodes are similar in size to previous studies. Heterogeneous appearance of the thyroid gland with scattered calcifications. No significant nodularity. Lungs/Pleura: There is dense consolidation and airspace disease throughout the left lung and in the right lung base. Air bronchograms are present. Small pleural effusions. Changes could represent pneumonia or possibly aspiration. No pneumothorax. Musculoskeletal: Degenerative changes in the spine. No destructive bone lesions. CT ABDOMEN PELVIS FINDINGS Hepatobiliary: The gallbladder is distended with thickened wall. A biliary stent is present without bile duct dilatation. Pancreas: Mass in the head of the pancreas without interval enlargement. Porta hepatis and celiac axis lymph nodes are enlarged, likely metastatic. No pancreatic ductal dilatation. Spleen: Normal in size without focal abnormality. Adrenals/Urinary Tract: 1.7 cm diameter left adrenal gland nodule. Additional 1.3 cm left adrenal gland nodule. No change in appearance. Low-attenuation suggests fat containing adenomas. Pelvic location of the right kidney. Kidneys appear otherwise  symmetrical. No hydronephrosis. No stones. Bladder is unremarkable. Stomach/Bowel: Stomach, small bowel, and colon are not abnormally distended. Contrast material fills the colon. No free air or free contrast to suggest bowel perforation. Vascular/Lymphatic: Calcification of the aorta. No aneurysm. Retroperitoneal lymph nodes in the upper abdomen are mildly enlarged, similar prior study, likely metastatic. Reproductive: Prostate gland is enlarged. Other: Diffuse free fluid throughout the abdomen and pelvis, likely ascites. No free air. Edema in the subcutaneous fat. Musculoskeletal: No destructive bone lesions. IMPRESSION: 1. Dense consolidation and airspace disease throughout the left lung and in the right lung base. Small pleural effusions. Changes likely to represent pneumonia or possibly aspiration. 2. Fluid-filled esophagus, possibly indicating reflux or dysmotility. 3. Mass in the head of the pancreas without interval enlargement. 4. Porta hepatis, celiac axis, and retroperitoneal lymph nodes are enlarged, likely metastatic. 5. Diffuse free fluid throughout the abdomen and pelvis, likely ascites. 6. Bilateral adrenal gland nodules, likely metastatic. 7. Pelvic location of the right kidney. 8. Enlarged prostate gland. 9. Aortic atherosclerosis. 10. No evidence of bowel obstruction or perforation. Aortic Atherosclerosis (ICD10-I70.0). Electronically Signed   By: Lucienne Capers M.D.   On: 06/02/2020 02:45   CT ABDOMEN PELVIS WO CONTRAST  Result Date: 05/30/2020 CLINICAL DATA:  Nausea and vomiting. History of pancreatic cancer. EXAM: CT ABDOMEN AND PELVIS WITHOUT CONTRAST TECHNIQUE: Multidetector CT  imaging of the abdomen and pelvis was performed following the standard protocol without IV contrast. COMPARISON:  May 03, 2020 FINDINGS: Lower chest: Mild atelectasis and/or infiltrate is seen along the inferior aspect of the left upper lobe. Hepatobiliary: There is a mild amount of perihepatic fluid. This is  decreased in severity when compared to the prior exam. A few tiny foci of air are seen within the right lobe of the liver (axial CT images 16 through 19, CT series number 2). This is decreased in severity when compared to the prior study. A tiny focus of air is also seen within the lumen of a mildly distended gallbladder. This is also decreased in severity. No gallstones, gallbladder wall thickening, or biliary dilatation. Pancreas: A common bile duct stent is seen within the head of the pancreas. Persistent enlargement of the pancreatic head is seen. It should be noted that this area, and surrounding lymph nodes, are limited in evaluation in the absence of intravenous contrast. Spleen: A mild amount of perisplenic fluid is seen. Spleen is otherwise normal in appearance. Adrenals/Urinary Tract: The right adrenal gland is normal in appearance. Stable 2.1 cm x 1.5 cm and 1.3 cm x 1.0 cm low-attenuation left adrenal masses are seen. 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 Kidneys are normal in size, without renal calculi, focal lesion, or hydronephrosis. The urinary bladder is partially contracted and otherwise normal in appearance. Stomach/Bowel: Stomach is within normal limits. Appendix appears normal. No evidence of bowel wall thickening, distention, or inflammatory changes. Vascular/Lymphatic: There is moderate severity calcification of the abdominal aorta and bilateral common iliac arteries, without evidence of aneurysmal dilatation. No enlarged abdominal or pelvic lymph nodes. Reproductive: Prostate is unremarkable. Other: No abdominal wall hernia or abnormality. Mild diffuse nonspecific mesenteric inflammatory fat stranding is seen throughout the abdomen. A mild to moderate amount of pelvic free fluid is seen. This is stable in appearance. Musculoskeletal: No acute or significant osseous findings. IMPRESSION: 1. Persistent enlargement of the pancreatic head with a distal common bile duct stent in place. 2. Interval  decrease in severity of tiny foci of air within the right lobe of the liver and gallbladder. 3. Mild amount of perihepatic and perisplenic fluid, decreased in severity when compared to the prior exam. 4. Stable low-attenuation left adrenal masses which may represent adrenal adenomas. 5. Mild to moderate amount of pelvic free fluid, stable. 6. Mild left upper lobe atelectasis and/or infiltrate. 7. Diffuse mesenteric inflammatory fat stranding secondary to diffuse mesenteritis cannot be excluded. 8. Aortic atherosclerosis. Aortic Atherosclerosis (ICD10-I70.0). Electronically Signed   By: Virgina Norfolk M.D.   On: 05/30/2020 16:07   CT CHEST WO CONTRAST  Result Date: 06/02/2020 CLINICAL DATA:  History of pancreatic cancer with liver metastasis and ascites. Vomiting and weakness. Multiple recent paracentesis. Patient is deteriorating. To evaluate for perforation. EXAM: CT CHEST, ABDOMEN AND PELVIS WITHOUT CONTRAST TECHNIQUE: Multidetector CT imaging of the chest, abdomen and pelvis was performed following the standard protocol without IV contrast. COMPARISON:  CT abdomen and pelvis 05/30/2020.  CT chest 05/03/2020 FINDINGS: CT CHEST FINDINGS Cardiovascular: Normal heart size. No pericardial effusions. Normal caliber thoracic aorta. Scattered aortic and coronary artery calcifications. Right central venous catheter with tip in the low SVC. Mediastinum/Nodes: Esophagus is decompressed but fluid-filled, possibly indicating reflux or dysmotility. Pretracheal lymph node measures about 1.5 cm diameter. A paraesophageal lymph node at the abdominal hiatus measures about 1.9 cm diameter. Lymph nodes are similar in size to previous studies. Heterogeneous appearance of the thyroid gland with  scattered calcifications. No significant nodularity. Lungs/Pleura: There is dense consolidation and airspace disease throughout the left lung and in the right lung base. Air bronchograms are present. Small pleural effusions. Changes  could represent pneumonia or possibly aspiration. No pneumothorax. Musculoskeletal: Degenerative changes in the spine. No destructive bone lesions. CT ABDOMEN PELVIS FINDINGS Hepatobiliary: The gallbladder is distended with thickened wall. A biliary stent is present without bile duct dilatation. Pancreas: Mass in the head of the pancreas without interval enlargement. Porta hepatis and celiac axis lymph nodes are enlarged, likely metastatic. No pancreatic ductal dilatation. Spleen: Normal in size without focal abnormality. Adrenals/Urinary Tract: 1.7 cm diameter left adrenal gland nodule. Additional 1.3 cm left adrenal gland nodule. No change in appearance. Low-attenuation suggests fat containing adenomas. Pelvic location of the right kidney. Kidneys appear otherwise symmetrical. No hydronephrosis. No stones. Bladder is unremarkable. Stomach/Bowel: Stomach, small bowel, and colon are not abnormally distended. Contrast material fills the colon. No free air or free contrast to suggest bowel perforation. Vascular/Lymphatic: Calcification of the aorta. No aneurysm. Retroperitoneal lymph nodes in the upper abdomen are mildly enlarged, similar prior study, likely metastatic. Reproductive: Prostate gland is enlarged. Other: Diffuse free fluid throughout the abdomen and pelvis, likely ascites. No free air. Edema in the subcutaneous fat. Musculoskeletal: No destructive bone lesions. IMPRESSION: 1. Dense consolidation and airspace disease throughout the left lung and in the right lung base. Small pleural effusions. Changes likely to represent pneumonia or possibly aspiration. 2. Fluid-filled esophagus, possibly indicating reflux or dysmotility. 3. Mass in the head of the pancreas without interval enlargement. 4. Porta hepatis, celiac axis, and retroperitoneal lymph nodes are enlarged, likely metastatic. 5. Diffuse free fluid throughout the abdomen and pelvis, likely ascites. 6. Bilateral adrenal gland nodules, likely  metastatic. 7. Pelvic location of the right kidney. 8. Enlarged prostate gland. 9. Aortic atherosclerosis. 10. No evidence of bowel obstruction or perforation. Aortic Atherosclerosis (ICD10-I70.0). Electronically Signed   By: Lucienne Capers M.D.   On: 06/02/2020 02:45   US Paracentesis  Result Date: 05/23/2020 INDICATION: Patient with history of metastatic pancreatic cancer, recurrent ascites. Request made for diagnostic and therapeutic paracentesis. EXAM: ULTRASOUND GUIDED DIAGNOSTIC AND THERAPEUTIC PARACENTESIS MEDICATIONS: 1% lidocaine to skin and subcutaneous tissue COMPLICATIONS: None immediate. PROCEDURE: Informed written consent was obtained from the patient after a discussion of the risks, benefits and alternatives to treatment. A timeout was performed prior to the initiation of the procedure. Initial ultrasound scanning demonstrates a small to moderate amount of ascites within the left lower abdominal quadrant. The left lower abdomen was prepped and draped in the usual sterile fashion. 1% lidocaine was used for local anesthesia. Following this, a 19 gauge, 7-cm, Yueh catheter was introduced. An ultrasound image was saved for documentation purposes. The paracentesis was performed. The catheter was removed and a dressing was applied. The patient tolerated the procedure well without immediate post procedural complication. FINDINGS: A total of approximately 1.5 liters of amber/blood-tinged fluid was removed. Samples were sent to the laboratory as requested by the clinical team. IMPRESSION: Successful ultrasound-guided diagnostic and therapeutic paracentesis yielding 1.5 liters of peritoneal fluid. Read by: Rowe Edmon, PA-C Electronically Signed   By: Jacqulynn Cadet M.D.   On: 05/23/2020 15:13   US Paracentesis  Result Date: 05/06/2020 INDICATION: Patient with history of metastatic pancreatic cancer, ascites. Request made for diagnostic and therapeutic paracentesis. EXAM: ULTRASOUND GUIDED  DIAGNOSTIC AND THERAPEUTIC PARACENTESIS MEDICATIONS: 1% lidocaine to skin and subcutaneous tissue COMPLICATIONS: None immediate. PROCEDURE: Informed written consent was obtained from the  patient after a discussion of the risks, benefits and alternatives to treatment. A timeout was performed prior to the initiation of the procedure. Initial ultrasound scanning demonstrates a small to moderate amount of ascites within the left lower abdominal quadrant. The left lower abdomen was prepped and draped in the usual sterile fashion. 1% lidocaine was used for local anesthesia. Following this, a 19 gauge, 10-cm, Yueh catheter was introduced. An ultrasound image was saved for documentation purposes. The paracentesis was performed. The catheter was removed and a dressing was applied. The patient tolerated the procedure well without immediate post procedural complication. FINDINGS: A total of approximately 1.5 liters of yellow fluid was removed. Samples were sent to the laboratory as requested by the clinical team. IMPRESSION: Successful ultrasound-guided diagnostic and therapeutic paracentesis yielding 1.5 liters of peritoneal fluid. Read by: Rowe Renji, PA-C Electronically Signed   By: Jerilynn Mages.  Shick M.D.   On: 05/06/2020 11:53   DG CHEST PORT 1 VIEW  Result Date: 06/01/2020 CLINICAL DATA:  Shortness of breath status post EGD EXAM: PORTABLE CHEST 1 VIEW COMPARISON:  05/30/2020, CT 05/03/2020 FINDINGS: Right-sided central venous port tip over the SVC. The right lung is grossly clear. Interim development of fairly extensive consolidation and ground-glass opacity throughout the left thorax. No pleural effusion. Stable cardiomediastinal silhouette. No pneumothorax. IMPRESSION: Development of fairly extensive consolidation and ground-glass opacity throughout the left thorax, findings could be secondary to asymmetric edema, pulmonary hemorrhage, or diffuse pneumonia. Correlation with CT could be considered. Electronically Signed    By: Donavan Foil M.D.   On: 06/01/2020 16:57   DG CHEST PORT 1 VIEW  Result Date: 05/30/2020 CLINICAL DATA:  Hypoxia. EXAM: PORTABLE CHEST 1 VIEW COMPARISON:  Prior chest radiographs 05/30/2020. Chest CT 05/03/2020. FINDINGS: Right chest infusion port catheter with tip projecting in the region of the superior cavoatrial junction. Heart size within normal limits. Mild ill-defined/linear opacities within the left lung base are more pronounced as compared to the chest radiographs performed earlier the same day and favored to reflect atelectasis. The right lung is clear. No evidence of pleural effusion or pneumothorax. Redemonstrated sclerosis within the anterior left fourth rib which is likely posttraumatic in etiology. IMPRESSION: Mild ill-defined/linear opacities within the left lung base are more pronounced as compared to the chest radiographs performed earlier the same day and favored to reflect atelectasis. Redemonstrated sclerosis within the anterior left fourth rib which is likely posttraumatic in etiology. Continued attention recommended on follow-up. Electronically Signed   By: Kellie Simmering DO   On: 05/30/2020 08:20   DG Abd 2 Views  Result Date: 05/17/2020 CLINICAL DATA:  Metastatic pancreatic cancer.  Nausea and vomiting. EXAM: X-RAY ABDOMEN 2 VIEWS COMPARISON:  02/10/2020 FINDINGS: Right upper quadrant stent, consistent with biliary ductal stent, as seen previously. Bowel gas pattern does not show evidence of ileus, obstruction or free air. No significant bone findings. IMPRESSION: Right upper quadrant stent. No evidence of ileus or obstruction. No free air. Electronically Signed   By: Nelson Chimes M.D.   On: 05/17/2020 12:20   DG ABD ACUTE 2+V W 1V CHEST  Result Date: 05/30/2020 CLINICAL DATA:  Nausea, metastatic pancreatic carcinoma EXAM: ACUTE ABDOMEN SERIES (2 VIEW ABDOMEN AND 1 VIEW CHEST) COMPARISON:  Radiograph 05/17/2020, CT 05/03/2020 FINDINGS: Right IJ approach Port-A-Cath tip  terminates at the superior cavoatrial junction. Telemetry leads overlie the chest. Few streaky likely atelectatic opacities in the lung bases. No consolidation, features of edema, pneumothorax, or effusion. Pulmonary vascularity is normally distributed. The aorta  is calcified. The remaining cardiomediastinal contours are unremarkable. A biliary stent is seen in the upper abdomen. No high-grade obstructive bowel gas pattern. Moderate colonic stool burden. No subdiaphragmatic free air. No suspicious calcifications. Increased attenuation of the abdomen may reflect a degree of ascites. No acute or suspicious osseous lesions. Degenerative changes in the spine, shoulders, hips and pelvis. IMPRESSION: 1. Atelectatic changes in the lungs. 2. No high-grade obstructive bowel gas pattern.  No free air. 3. Moderate colonic stool burden. 4. Possible ascites. 5.  Aortic Atherosclerosis (ICD10-I70.0). Electronically Signed   By: Lovena Le M.D.   On: 05/30/2020 00:33   DG Abd Portable 1V  Result Date: 06/03/2020 CLINICAL DATA:  Duodenal obstruction.  Evaluate stomach. EXAM: PORTABLE ABDOMEN - 1 VIEW COMPARISON:  05/31/2020 FINDINGS: Moderate gastric dilatation with progression from the prior study. No dilatation of the large and small bowel. Stent in the common bile duct again noted. Bibasilar airspace disease is extensive. IMPRESSION: Progressive gastric dilatation. Electronically Signed   By: Franchot Gallo M.D.   On: 06/03/2020 08:38   DG [facility] Dys Tube Plc W/Fl W/Rad  Result Date: 06/03/2020 CLINICAL DATA:  Attempted NG tube placement on the floor. EXAM: NASO G TUBE PLACEMENT WITH FL AND WITH RAD FLUOROSCOPY TIME:  Fluoroscopy Time:  18 seconds Radiation Exposure Index (if provided by the fluoroscopic device): 1.1 mGy Number of Acquired Spot Images: 0 COMPARISON:  None. FINDINGS: Three attempts were made after numbing of the nasal passages and lubricating a 10 French nasogastric tube. We were unable to advance the tube  beyond the nasal cavity. It may be that endoscopic means may be necessary for placement. Assessment of previous maxillofacial CT shows very limited clearance between turbinates and nasal septum which may account for this difficulty. IMPRESSION: Unsuccessful nasogastric tube placement as described. Electronically Signed   By: Zetta Bills M.D.   On: 06/03/2020 16:39   DG UGI W SINGLE CM (SOL OR THIN BA)  Result Date: 05/31/2020 CLINICAL DATA:  Nausea vomiting, history of pancreatic neoplasm EXAM: WATER SOLUBLE UPPER GI SERIES TECHNIQUE: Single-column upper GI series was performed using water soluble contrast. CONTRAST:  126mL OMNIPAQUE IOHEXOL 300 MG/ML  SOLN COMPARISON:  CT abdomen and pelvis from 05/30/2020 FLUOROSCOPY TIME:  Fluoroscopy Time:  1 minutes 35 seconds Radiation Exposure Index (if provided by the fluoroscopic device): 16.6 mGy Number of Acquired Spot Images: 1 FINDINGS: Scout image demonstrates a biliary stent in place over the RIGHT upper quadrant projecting to the RIGHT of the spine. With the head of the table slightly elevated swallowing of water-soluble contrast was performed in bilateral oblique positioning. The esophagus was found to be patulous, limited assessment given the use of water-soluble contrast. Contrast flowed into a markedly distended stomach and was immediately diluted with the luminal contents. Gastric folds appear thickened for the degree of distension though not well evaluated on water-soluble assessment. Transition from esophagus to stomach was grossly normal aside from the esophageal dilation and the presence of reflux which was demonstrated from the stomach into the esophagus during the exam. The patient was then placed into the RIGHT lateral position and the RAO position. Only a small amount of contrast could be seen traversing the area of the stent and the area of the pancreatic and duodenal groove. Dilation of the duodenum proximal to this area was noted. Patient was  LEFT in this position and progressive distension of the duodenal bulb and proximal duodenum, proximal to the stent was identified with small amounts of contrast  passing intermittently across the site of presumed duodenal narrowing. The patient was then placed back into the supine position and gastric dilation and reflux again documented with some persistent contrast first and second portion of the duodenum leading up to the site of partial obstruction. IMPRESSION: 1. Moderate-to-marked partial duodenal obstruction at the level of the pancreatic tumor and stent as described. Only a small amount of contrast passed beyond this area during the examination in the RIGHT lateral position and RAO position. 2. This results in marked gastric distension which is exhibited on the current study and in gastroesophageal reflux. Electronically Signed   By: Zetta Bills M.D.   On: 05/31/2020 09:31   IR Paracentesis  Result Date: 05/16/2020 INDICATION: Patient with a history of pancreatic cancer and ascites presents today for a therapeutic paracentesis. EXAM: ULTRASOUND GUIDED PARACENTESIS MEDICATIONS: 1% lidocaine 10 mL COMPLICATIONS: None immediate. PROCEDURE: Informed written consent was obtained from the patient after a discussion of the risks, benefits and alternatives to treatment. A timeout was performed prior to the initiation of the procedure. Initial ultrasound scanning demonstrates a large amount of ascites within the right lower abdominal quadrant. The right lower abdomen was prepped and draped in the usual sterile fashion. 1% lidocaine was used for local anesthesia. Following this, a 19 gauge, 7-cm, Yueh catheter was introduced. An ultrasound image was saved for documentation purposes. The paracentesis was performed. The catheter was removed and a dressing was applied. The patient tolerated the procedure well without immediate post procedural complication. FINDINGS: A total of approximately 1 L of clear yellow  fluid was removed. IMPRESSION: Successful ultrasound-guided paracentesis yielding 1 liters of peritoneal fluid. Read by: Soyla Dryer, NP Electronically Signed   By: Corrie Mckusick D.O.   On: 05/16/2020 15:13      IMPRESSION/PLAN: 1. 64 y.o. male with duodenal obstruction secondary to metastatic pancreatic cancer. Today, I talked to the patient and family about the findings and workup thus far. We discussed the natural history of metastatic pancreatic carcinoma and general treatment, highlighting the role of radiotherapy in the management. We discussed the available radiation techniques, and focused on the details and logistics of delivery. The recommendation is to proceed with a 2 week course of palliative radiotherapy to the obstructing mass in an attempt to shrink it and allow him to eat and enjoy solid foods again which would greatly improve his quality of life. We reviewed the anticipated acute and late sequelae associated with radiation in this setting. We also discussed the possibility that Dr. Marin Olp would want to use low dose systemic therapy in combination with the radiotherapy to boost the effectiveness of treatment. We will share our discussion with Dr. Marin Olp and if he feels that the patient will tolerate concurrent chemoradiation, we will coordinate treatment and likely extend the course of treatment to 3 weeks instead of 2 weeks to reduce toxicities of treatment. The patient was encouraged to ask questions that were answered to his satisfaction.  At the conclusion of our conversation, the patient elects to proceed with the palliative radiation, +/- systemic therapy as recommended.  He has freely signed written consent to proceed and a copy of this document will be placed in his medical record. He is scheduled for CT simulation/treatment planning at 2:30 PM this afternoon in anticipation of beginning his daily radiation treatments on Monday, 06/06/2020. I will communicate with Dr. Marin Olp to  determine if he prefers treatment be current with systemic therapy. We would prefer to get the first couple of  radiation treatments in while he is still an inpatient but he understands that once his medical team deems him stable for discharge, we can continue his daily radiation treatments on an outpatient basis.  In a visit lasting 70 minutes, greater than 50% of that time was spent in floor time, reviewing his chart, discussing his case and coordinating his care with his medical team.   Jodelle Gross, MD, PhD    Nicholos Johns, MMS, PA-C [facility]: [phone]  Fax: [phone]

## 2020-06-03 NOTE — Progress Notes (Signed)
PT Cancellation Note  Patient Details Name: Jeffrey Harris MRN: [id number] DOB: 16-Mar-1956   Cancelled Treatment:    Reason Eval/Treat Not Completed: Medical issues which prohibited therapy, remains on pressors, HFNC. Will continue to follow for mobility as able to tolerate medically.   Claretha Cooper 06/03/2020, 7:16 AM [place] Pager [phone] Office [phone]

## 2020-06-03 NOTE — Progress Notes (Signed)
2 Days Post-Op  Subjective: More alert today.  Weaning of levo, off neo.  Telling me he is hungry and wants to know when he can get his j-tube for nutrition.  Denies nausea currently, but feels bloated.  ROS: See above, otherwise other systems negative  Objective: Vital signs in last 24 hours: Temp:  [97.4 F (36.3 C)-97.8 F (36.6 C)] 97.4 F (36.3 C) (10/01 0400) Pulse Rate:  [60-106] 66 (10/01 0630) Resp:  [14-32] 26 (10/01 0630) BP: (160)/(85) 160/85 (09/30 1200) SpO2:  [81 %-100 %] 94 % (10/01 0741) Arterial Line BP: (83-151)/(48-80) 115/66 (10/01 0630) Last BM Date: 05/31/20  Intake/Output from previous day: 09/30 0701 - 10/01 0700 In: 1945.2 [I.V.:1460.6; IV Piggyback:484.7] Out: 2125 [Urine:2125] Intake/Output this shift: No intake/output data recorded.  PE: Heart: regular Lungs: getting a breathing treatment currently Abd: soft, mild distention, +BS, NT   Lab Results:  Recent Labs    06/02/20 0453 06/03/20 0335  WBC 25.9* 19.7*  HGB 12.0* 10.6*  HCT 34.4* 30.4*  PLT 101* 90*   BMET Recent Labs    06/02/20 0453 06/03/20 0335  NA 128* 133*  K 3.9 3.3*  CL 91* 98  CO2 20* 21*  GLUCOSE 184* 187*  BUN 15 13  CREATININE 1.00 0.58*  CALCIUM 7.0* 7.1*   PT/INR Recent Labs    06/01/20 1652  LABPROT 22.9*  INR 2.1*   CMP     Component Value Date/Time   NA 133 (L) 06/03/2020 0335   K 3.3 (L) 06/03/2020 0335   CL 98 06/03/2020 0335   CO2 21 (L) 06/03/2020 0335   GLUCOSE 187 (H) 06/03/2020 0335   BUN 13 06/03/2020 0335   CREATININE 0.58 (L) 06/03/2020 0335   CREATININE 0.58 (L) 05/17/2020 1020   CALCIUM 7.1 (L) 06/03/2020 0335   PROT 5.1 (L) 06/03/2020 0335   ALBUMIN 2.4 (L) 06/03/2020 0335   AST 35 06/03/2020 0335   AST 48 (H) 05/17/2020 1020   ALT 23 06/03/2020 0335   ALT 27 05/17/2020 1020   ALKPHOS 113 06/03/2020 0335   BILITOT 1.9 (H) 06/03/2020 0335   BILITOT 1.2 05/17/2020 1020   GFRNONAA >60 06/03/2020 0335   GFRNONAA >60  05/17/2020 1020   GFRAA >60 06/03/2020 0335   GFRAA >60 05/17/2020 1020   Lipase     Component Value Date/Time   LIPASE 29 05/29/2020 2247       Studies/Results: CT ABDOMEN PELVIS WO CONTRAST  Result Date: 06/02/2020 CLINICAL DATA:  History of pancreatic cancer with liver metastasis and ascites. Vomiting and weakness. Multiple recent paracentesis. Patient is deteriorating. To evaluate for perforation. EXAM: CT CHEST, ABDOMEN AND PELVIS WITHOUT CONTRAST TECHNIQUE: Multidetector CT imaging of the chest, abdomen and pelvis was performed following the standard protocol without IV contrast. COMPARISON:  CT abdomen and pelvis 05/30/2020.  CT chest 05/03/2020 FINDINGS: CT CHEST FINDINGS Cardiovascular: Normal heart size. No pericardial effusions. Normal caliber thoracic aorta. Scattered aortic and coronary artery calcifications. Right central venous catheter with tip in the low SVC. Mediastinum/Nodes: Esophagus is decompressed but fluid-filled, possibly indicating reflux or dysmotility. Pretracheal lymph node measures about 1.5 cm diameter. A paraesophageal lymph node at the abdominal hiatus measures about 1.9 cm diameter. Lymph nodes are similar in size to previous studies. Heterogeneous appearance of the thyroid gland with scattered calcifications. No significant nodularity. Lungs/Pleura: There is dense consolidation and airspace disease throughout the left lung and in the right lung base. Air bronchograms are present. Small pleural effusions. Changes  could represent pneumonia or possibly aspiration. No pneumothorax. Musculoskeletal: Degenerative changes in the spine. No destructive bone lesions. CT ABDOMEN PELVIS FINDINGS Hepatobiliary: The gallbladder is distended with thickened wall. A biliary stent is present without bile duct dilatation. Pancreas: Mass in the head of the pancreas without interval enlargement. Porta hepatis and celiac axis lymph nodes are enlarged, likely metastatic. No pancreatic  ductal dilatation. Spleen: Normal in size without focal abnormality. Adrenals/Urinary Tract: 1.7 cm diameter left adrenal gland nodule. Additional 1.3 cm left adrenal gland nodule. No change in appearance. Low-attenuation suggests fat containing adenomas. Pelvic location of the right kidney. Kidneys appear otherwise symmetrical. No hydronephrosis. No stones. Bladder is unremarkable. Stomach/Bowel: Stomach, small bowel, and colon are not abnormally distended. Contrast material fills the colon. No free air or free contrast to suggest bowel perforation. Vascular/Lymphatic: Calcification of the aorta. No aneurysm. Retroperitoneal lymph nodes in the upper abdomen are mildly enlarged, similar prior study, likely metastatic. Reproductive: Prostate gland is enlarged. Other: Diffuse free fluid throughout the abdomen and pelvis, likely ascites. No free air. Edema in the subcutaneous fat. Musculoskeletal: No destructive bone lesions. IMPRESSION: 1. Dense consolidation and airspace disease throughout the left lung and in the right lung base. Small pleural effusions. Changes likely to represent pneumonia or possibly aspiration. 2. Fluid-filled esophagus, possibly indicating reflux or dysmotility. 3. Mass in the head of the pancreas without interval enlargement. 4. Porta hepatis, celiac axis, and retroperitoneal lymph nodes are enlarged, likely metastatic. 5. Diffuse free fluid throughout the abdomen and pelvis, likely ascites. 6. Bilateral adrenal gland nodules, likely metastatic. 7. Pelvic location of the right kidney. 8. Enlarged prostate gland. 9. Aortic atherosclerosis. 10. No evidence of bowel obstruction or perforation. Aortic Atherosclerosis (ICD10-I70.0). Electronically Signed   By: Lucienne Capers M.D.   On: 06/02/2020 02:45   CT CHEST WO CONTRAST  Result Date: 06/02/2020 CLINICAL DATA:  History of pancreatic cancer with liver metastasis and ascites. Vomiting and weakness. Multiple recent paracentesis. Patient is  deteriorating. To evaluate for perforation. EXAM: CT CHEST, ABDOMEN AND PELVIS WITHOUT CONTRAST TECHNIQUE: Multidetector CT imaging of the chest, abdomen and pelvis was performed following the standard protocol without IV contrast. COMPARISON:  CT abdomen and pelvis 05/30/2020.  CT chest 05/03/2020 FINDINGS: CT CHEST FINDINGS Cardiovascular: Normal heart size. No pericardial effusions. Normal caliber thoracic aorta. Scattered aortic and coronary artery calcifications. Right central venous catheter with tip in the low SVC. Mediastinum/Nodes: Esophagus is decompressed but fluid-filled, possibly indicating reflux or dysmotility. Pretracheal lymph node measures about 1.5 cm diameter. A paraesophageal lymph node at the abdominal hiatus measures about 1.9 cm diameter. Lymph nodes are similar in size to previous studies. Heterogeneous appearance of the thyroid gland with scattered calcifications. No significant nodularity. Lungs/Pleura: There is dense consolidation and airspace disease throughout the left lung and in the right lung base. Air bronchograms are present. Small pleural effusions. Changes could represent pneumonia or possibly aspiration. No pneumothorax. Musculoskeletal: Degenerative changes in the spine. No destructive bone lesions. CT ABDOMEN PELVIS FINDINGS Hepatobiliary: The gallbladder is distended with thickened wall. A biliary stent is present without bile duct dilatation. Pancreas: Mass in the head of the pancreas without interval enlargement. Porta hepatis and celiac axis lymph nodes are enlarged, likely metastatic. No pancreatic ductal dilatation. Spleen: Normal in size without focal abnormality. Adrenals/Urinary Tract: 1.7 cm diameter left adrenal gland nodule. Additional 1.3 cm left adrenal gland nodule. No change in appearance. Low-attenuation suggests fat containing adenomas. Pelvic location of the right kidney. Kidneys appear otherwise symmetrical. No  hydronephrosis. No stones. Bladder is  unremarkable. Stomach/Bowel: Stomach, small bowel, and colon are not abnormally distended. Contrast material fills the colon. No free air or free contrast to suggest bowel perforation. Vascular/Lymphatic: Calcification of the aorta. No aneurysm. Retroperitoneal lymph nodes in the upper abdomen are mildly enlarged, similar prior study, likely metastatic. Reproductive: Prostate gland is enlarged. Other: Diffuse free fluid throughout the abdomen and pelvis, likely ascites. No free air. Edema in the subcutaneous fat. Musculoskeletal: No destructive bone lesions. IMPRESSION: 1. Dense consolidation and airspace disease throughout the left lung and in the right lung base. Small pleural effusions. Changes likely to represent pneumonia or possibly aspiration. 2. Fluid-filled esophagus, possibly indicating reflux or dysmotility. 3. Mass in the head of the pancreas without interval enlargement. 4. Porta hepatis, celiac axis, and retroperitoneal lymph nodes are enlarged, likely metastatic. 5. Diffuse free fluid throughout the abdomen and pelvis, likely ascites. 6. Bilateral adrenal gland nodules, likely metastatic. 7. Pelvic location of the right kidney. 8. Enlarged prostate gland. 9. Aortic atherosclerosis. 10. No evidence of bowel obstruction or perforation. Aortic Atherosclerosis (ICD10-I70.0). Electronically Signed   By: Lucienne Capers M.D.   On: 06/02/2020 02:45   DG CHEST PORT 1 VIEW  Result Date: 06/01/2020 CLINICAL DATA:  Shortness of breath status post EGD EXAM: PORTABLE CHEST 1 VIEW COMPARISON:  05/30/2020, CT 05/03/2020 FINDINGS: Right-sided central venous port tip over the SVC. The right lung is grossly clear. Interim development of fairly extensive consolidation and ground-glass opacity throughout the left thorax. No pleural effusion. Stable cardiomediastinal silhouette. No pneumothorax. IMPRESSION: Development of fairly extensive consolidation and ground-glass opacity throughout the left thorax, findings  could be secondary to asymmetric edema, pulmonary hemorrhage, or diffuse pneumonia. Correlation with CT could be considered. Electronically Signed   By: Donavan Foil M.D.   On: 06/01/2020 16:57    Anti-infectives: Anti-infectives (From admission, onward)   Start     Dose/Rate Route Frequency Ordered Stop   06/02/20 0500  vancomycin (VANCOREADY) IVPB 1500 mg/300 mL  Status:  Discontinued        1,500 mg 150 mL/hr over 120 Minutes Intravenous STAT 06/01/20 1649 06/01/20 2051   06/02/20 0500  vancomycin (VANCOREADY) IVPB 1500 mg/300 mL  Status:  Discontinued        1,500 mg 150 mL/hr over 120 Minutes Intravenous Every 12 hours 06/01/20 2051 06/02/20 1333   06/01/20 1700  vancomycin (VANCOREADY) IVPB 1750 mg/350 mL        1,750 mg 175 mL/hr over 120 Minutes Intravenous STAT 06/01/20 1647 06/02/20 1745   06/01/20 1600  ceFEPIme (MAXIPIME) 2 g in sodium chloride 0.9 % 100 mL IVPB        2 g 200 mL/hr over 30 Minutes Intravenous Every 8 hours 06/01/20 1458     05/30/20 1930  metroNIDAZOLE (FLAGYL) IVPB 500 mg  Status:  Discontinued        500 mg 100 mL/hr over 60 Minutes Intravenous Every 8 hours 05/30/20 1859 05/31/20 1103       Assessment/Plan Aspiration PNA with sepsis - improving, but chest CT yesterday showed significant disease in left lung.  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  I do wonder if he understands how poor his overall prognosis is.  Nonetheless, he indicates he would like a feeding tube placed so he can do radiation and chemotherapy that Dr.  Ennever has proposed.  Before he could undergo surgical intervention, he will need to continue to improve from a pulmonary standpoint before pulmonary stress from GETA.  Suspect we would not proceed with placement until sometime next week if that were to remain the plan.   In the interim, he has no gastric decompression despite already having aspirated causing significant PNA.  I will check a plain film today to see how large his stomach is.  It was moderate size on CT yesterday.   If his stomach is distended he will likely need a temporary NGT to decompress him and prevent further aspiration events.  If his stomach is not very distended he can have watchful waiting until surgery could be planned.  Will follow along.  FEN - npo VTE - scds ID - maxipime for lungs   LOS: 4 days    Henreitta Cea , [facility] 06/03/2020, 8:04 AM Please see Amion for pager number during day hours 7:00am-4:30pm or 7:00am -11:30am on weekends

## 2020-06-03 NOTE — Progress Notes (Addendum)
PROGRESS NOTE    Jeffrey Harris  OJJ:[id number] DOB: 11-Jan-1956 DOA: 05/29/2020 PCP: [name], MD    Chief Complaint  Patient presents with  . 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 Marin Olp, history of gout presented to [facility], ED with generalized weakness, intractable nausea and vomiting.  Patient reported that for the past 4 weeks he has been bouts of nausea and vomiting.  Described as nonbilious, nonbloody vomiting typically after attempting to eat solids.  Patient ported experiencing numerous episodes daily.  Also reported generalized weakness, lightheadedness upon standing up.  Denied any fevers, diarrhea, sick contacts, recent travel.  Recently diagnosed with malignant ascites, undergone paracentesis 3 times since diagnosed on September 3.  Patient last received Gemzar and Abraxane on 9/7 Patient was found to be profoundly volume depleted in ED with moderate hyponatremia, severe hypokalemia, hypochloremia and alkalosis.  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  Postprocedure patient seemed to be doing fine however patient was transferred to the floor and noted to be tachycardic initially and subsequently hypotensive, significant pallor, cough.  Concern for septic shock versus acute bleed versus possible perforation as patient underwent recent procedure with a large mass noted.  Patient transferred to the ICU, currently on Levophed drip.  Neo-Synephrine drip was weaned off 06/02/2020.  Patient also noted on IV Solu-Cortef.  Hypotension improved.  Decrease IV fluids to 100 cc/h. Chest x-ray done concerning for left-sided pneumonia.  Patient noted to have a elevated lactic acid level of 5.6 which has trended down to 1.6, elevated procalcitonin of 23.32, blood cultures ordered and pending, urine strep pneumococcus antigen negative.  Urine Legionella antigen pending.  Urinalysis nitrite negative, leukocytes negative.  CT abdomen and pelvis negative for any perforation.  CT chest which was done with dense consolidation and airspace disease throughout the left lung and in the right lung base, small pleural effusions, fluid-filled esophagus possibly indicating reflux or dysmotility, mass in the head of the pancreas Patel interval enlargement, porta hepatis, celiac axis and retroperitoneal lymph nodes enlarged likely metastatic, diffuse free fluid throughout the abdomen and pelvis likely ascites, bilateral adrenal gland nodules likely metastatic, enlarged prostate gland, no evidence of bowel obstruction or perforation.  Continue empiric IV cefepime.  IV vancomycin discontinued.  Continue Mucinex, Xopenex, Atrovent nebs.  PCCM following and I appreciate input and recommendations.  2 acute respiratory failure with hypoxia secondary to aspiration pneumonia Patient noted to be severe sepsis with septic shock with shortness of breath post endoscopy 06/01/2020.  Chest x-ray CT chest done with a left-sided pneumonia and right basilar pneumonia.  Patient with  improvement with respiratory  status and currently on 8 L high flow nasal cannula from 15 L high flow in the past 24 hours.  Urine strep pneumococcus antigen negative.  Urine Legionella antigen pending.  Sputum Gram stain and cultures pending.  Continue empiric IV cefepime, Xopenex and Atrovent nebs.  IV vancomycin discontinued.  PCCM consulted and are following.  3 metastatic pancreatic cancer to the liver with recent diagnosis of malignant ascites status post paracentesis/obstructing mass Patient being followed by oncology, Dr. Marin Olp last received chemotherapy with Gemzar and Abraxane 05/10/2020.  Patient seen by gastroenterology due to concerns for nausea and vomiting, abdominal x-ray done showed no high-grade obstructive bowel gas pattern, no free air, moderate colonic stool burden with CT abdomen and pelvis done showing persistent enlargement of pancreatic head with distal CBD stent in place.  Upper GI series showed moderate to marked partial duodenal obstruction at the level of the pancreatic tumor and stent.  Patient underwent upper endoscopy 06/01/2020, which revealed a large obstructing duodenal mass.  Gastroenterology recommended general surgery consultation for placement of feeding jejunostomy tube.  General surgery consulted and are following and ordered plain films of the abdomen to assess how large his stomach is to determine whether NG tube temporary for decompression may be helpful.  Oncology following.  Palliative care following.  4.  Severe electrolyte  abnormalities/hypokalemia/hyponatremia/hypochloremia secondary to GI losses with intractable nausea and vomiting On admission patient noted to have a potassium of 2.4, sodium of 127, chloride less than 65, CO2 of 42.  Patient placed on aggressive IV fluid hydration, potassium supplementation.  Serum osmolality at 264, urine osmolality of 682, urine sodium of 144 concern for possible SIADH component due to malignancy.  Abdominal films done with  no high-grade obstructive bowel gas pattern, no free air, moderate colonic stool burden.  CT abdomen showed persistent enlargement of pancreatic head with a distal CBD stent in place.  GI was consulted upper GI series showed marked partial duodenal obstruction at the level of pancreatic tumor stent.  Patient underwent upper endoscopy this morning that showed a large obstructing duodenal mass.  Patient currently n.p.o.  General surgery consulted to assess for placement of feeding jejunostomy tube.  Replete electrolytes.  Follow.  5.  Severe dehydration Decrease IV fluids 100 cc/h.   6.  Malignant ascites Last paracentesis 05/23/2020.  We will hold off on paracentesis at this time.  7.  Prolonged QT interval EKG showed a prolonged QTC of 527 on admission.  Patient noted to have severe electrolyte abnormalities.  Repeat EKG with resolution of QT prolongation.  Keep potassium >4, magnesium >2.  Avoid QT prolongation medications.  Follow.  8.  Constipation Continue daily Dulcolax suppositories.  It is noted patient refusing suppository.  9.  Hyponatremia/hypokalemia Secondary to hypovolemic hyponatremia.  Improving with hydration.  Decrease IV fluids to 100 cc/h.  Potassium at 3.3.  Patient has received 2 rounds of KCl 10 mEq each.  We will give another KCl 10 mEq IV every 1 hour x2 rounds.  Follow.   10.  Failure to thrive Secondary to metastatic pancreatic cancer.  Palliative care following.  Patient currently n.p.o. secondary to large obstructing pancreatic mass.  General surgery consulted for placement of jejunostomy tube.  Tube placement on hold due to current respiratory status.  Prealbumin ordered per oncology. ??  IV albumin.  Follow.   DVT prophylaxis: SCDs Code Status: Full Family Communication: Updated patient.  No family at bedside.  Disposition:   Status is: Inpatient    Dispo: The  patient is from: Home              Anticipated d/c is to: To be determined               Anticipated d/c date is: To be determined.              Patient with septic shock secondary to aspiration pneumonia, on 1 pressors, on IV antibiotics, metastatic pancreatic cancer with obstruction.  Not stable for discharge.       Consultants:   General surgery: Dr. Hassell Done 06/01/2020  Palliative care: Dr. Rowe Pavy Jun 01, 2020  Gastroenterology: Dr. Collene Mares May 30, 2020  Oncology: Dr. Marin Olp May 30, 2020  PCCM: Dr Ander Slade 06/01/2020  Procedures:  Upper endoscopy Jun 01, 2020  CT abdomen and pelvis May 30, 2020  Chest x-ray May 30, 2020, May 31, 2020, Jun 01, 2020  CT abdomen and pelvis 06/01/2020  CT chest 06/01/2020  Abdominal films 06/03/2020  Antimicrobials:   IV cefepime Jun 01, 2020>>>>  IV vancomycin Jun 01, 2020>>>>> 06/02/2020   Subjective: Patient alert this morning, sitting up in chair.  Pallor improved.  Seems somewhat with his situational depression.  Denies any significant shortness of breath.  No chest pain.  No abdominal pain.  On 8 L high flow nasal cannula.  Currently on Levophed.  Off Neo-Synephrine.  Objective: Vitals:   06/03/20 0700 06/03/20 0740 06/03/20 0741 06/03/20 0800  BP:      Pulse: 76   87  Resp: (!) 25   (!) 25  Temp:    97.6 F (36.4 C)  TempSrc:    Axillary  SpO2: 91% 93% 94% 92%  Weight:      Height:        Intake/Output Summary (Last 24 hours) at 06/03/2020 1026 Last data filed at 06/03/2020 0805 Gross per 24 hour  Intake 3114.65 ml  Output 1975 ml  Net 1139.65 ml   Filed Weights   05/31/20 0232 06/01/20 1128  Weight: 78 kg 78 kg    Examination:  General exam: Alert.  Pallor improved.[name] Respiratory system: Decreased coarse breath sounds left and decreased coarse breath sounds in the right base.  No wheezing.  Fair air movement.  Speaking in full sentences.  On 8 L high flow nasal cannula.  Cardiovascular system: Regular rate rhythm no murmurs rubs or gallops.  No JVD.  No lower extremity edema.  Gastrointestinal system:  Abdomen is soft, nontender, nondistended, positive bowel sounds.  No rebound.  No guarding.  Central nervous system: Alert and oriented. No focal neurological deficits. Extremities: Symmetric 5 x 5 power. Skin: No rashes, lesions or ulcers Psychiatry: Judgement and insight appear normal. Mood & affect somewhat depressed and flattened.     Data Reviewed: I have personally reviewed following labs and imaging studies  CBC: Recent Labs  Lab 05/31/20 0930 06/01/20 0351 06/01/20 2142 06/02/20 0453 06/03/20 0335  WBC 7.2 9.9 33.0* 25.9* 19.7*  NEUTROABS 5.8  --  30.6* 24.0* 18.1*  HGB 12.2* 11.1* 13.8 12.0* 10.6*  HCT 35.7* 32.9* 40.0 34.4* 30.4*  MCV 98.1 98.2 99.0 98.0 97.1  PLT 142* 113* 149* 101* 90*    Basic Metabolic Panel: Recent Labs  Lab 05/29/20 2247 05/30/20 0614 05/31/20 0930 06/01/20 0351 06/01/20 1652 06/02/20 0453 06/03/20 0335  NA 127*   < > 125* 127* 122* 128* 133*  K 2.4*   < > 3.2* 3.0* 3.9 3.9 3.3*  CL <65*   < > 80* 82* 81*  91* 98  CO2 42*   < > 34* 34* 25 20* 21*  GLUCOSE 118*   < > 95 98 98 184* 187*  BUN 19   < > 12 8 10 15 13   CREATININE 1.01   < > 0.59* 0.61 0.95 1.00 0.58*  CALCIUM 8.6*   < > 7.3* 7.6* 7.3* 7.0* 7.1*  MG 2.4  --  2.3 2.3 2.0 2.2  --    < > = values in this interval not displayed.    GFR: Estimated Creatinine Clearance: 103.3 mL/min (A) (by C-G formula based on SCr of 0.58 mg/dL (L)).  Liver Function Tests: Recent Labs  Lab 05/29/20 2247 05/31/20 0930 06/01/20 1652 06/02/20 0453 06/03/20 0335  AST 39 34 35 33 35  ALT 20 20 18 18 23   ALKPHOS 182* 168* 179* 125 113  BILITOT 1.8* 1.3* 2.0* 2.0* 1.9*  PROT 7.3 5.6* 5.5* 5.1* 5.1*  ALBUMIN 3.3* 2.7* 2.5* 2.6* 2.4*    CBG: Recent Labs  Lab 06/02/20 1645 06/02/20 1934 06/02/20 2341 06/03/20 0317 06/03/20 0745  GLUCAP 213* 210* 199* 191* 147*     Recent Results (from the past 240 hour(s))  Respiratory Panel by RT PCR (Flu A&B, Covid) - Nasopharyngeal Swab      Status: None   Collection Time: 05/30/20  1:40 AM   Specimen: Nasopharyngeal Swab  Result Value Ref Range Status   SARS Coronavirus 2 by RT PCR NEGATIVE NEGATIVE Final    Comment: (NOTE) SARS-CoV-2 target nucleic acids are NOT DETECTED.  The SARS-CoV-2 RNA is generally detectable in upper respiratoy specimens during the acute phase of infection. The lowest concentration of SARS-CoV-2 viral copies this assay can detect is 131 copies/mL. A negative result does not preclude SARS-Cov-2 infection and should not be used as the sole basis for treatment or other patient management decisions. A negative result may occur with  improper specimen collection/handling, submission of specimen other than nasopharyngeal swab, presence of viral mutation(s) within the areas targeted by this assay, and inadequate number of viral copies (<131 copies/mL). A negative result must be combined with clinical observations, patient history, and epidemiological information. The expected result is Negative.  Fact Sheet for Patients:  PinkCheek.be  Fact Sheet for Healthcare Providers:  GravelBags.it  This test is no t yet approved or cleared by the [place] FDA and  has been authorized for detection and/or diagnosis of SARS-CoV-2 by FDA under an Emergency Use Authorization (EUA). This EUA will remain  in effect (meaning this test can be used) for the duration of the COVID-19 declaration under Section 564(b)(1) of the Act, 21 U.S.C. section 360bbb-3(b)(1), unless the authorization is terminated or revoked sooner.     Influenza A by PCR NEGATIVE NEGATIVE Final   Influenza B by PCR NEGATIVE NEGATIVE Final    Comment: (NOTE) The Xpert Xpress SARS-CoV-2/FLU/RSV assay is intended as an aid in  the diagnosis of influenza from Nasopharyngeal swab specimens and  should not be used as a sole basis for treatment. Nasal washings and  aspirates are  unacceptable for Xpert Xpress SARS-CoV-2/FLU/RSV  testing.  Fact Sheet for Patients: PinkCheek.be  Fact Sheet for Healthcare Providers: GravelBags.it  This test is not yet approved or cleared by the [place] FDA and  has been authorized for detection and/or diagnosis of SARS-CoV-2 by  FDA under an Emergency Use Authorization (EUA). This EUA will remain  in effect (meaning this test can be used) for the duration of the  Covid-19 declaration under  Section 564(b)(1) of the Act, 21  U.S.C. section 360bbb-3(b)(1), unless the authorization is  terminated or revoked. Performed at [facility], [place] [address] [place], [place] [address]   Culture, sputum-assessment     Status: None   Collection Time: 06/01/20  3:34 PM   Specimen: Sputum  Result Value Ref Range Status   Specimen Description SPU  Final   Special Requests NONE  Final   Sputum evaluation   Final    THIS SPECIMEN IS ACCEPTABLE FOR SPUTUM CULTURE Performed at [facility], [place] [address] [place], [place] [address]    Report Status 06/01/2020 FINAL  Final  Culture, respiratory     Status: None (Preliminary result)   Collection Time: 06/01/20  3:34 PM   Specimen: Sputum  Result Value Ref Range Status   Specimen Description   Final    SPU Performed at [place] [address], [place], [place] [address]    Special Requests   Final    NONE Reflexed from [phone] Performed at [facility], [place] [address] [place], [place] [address]    Gram Stain   Final    FEW WBC PRESENT,BOTH PMN AND MONONUCLEAR RARE GRAM NEGATIVE RODS MODERATE GRAM POSITIVE COCCI RARE YEAST    Culture   Final    CULTURE REINCUBATED FOR BETTER GROWTH Performed at [facility], [place] [address], [place], [place] [address]    Report Status PENDING  Incomplete  Culture, blood (Routine X 2) w Reflex to ID  Panel     Status: None (Preliminary result)   Collection Time: 06/01/20  4:38 PM   Specimen: BLOOD  Result Value Ref Range Status   Specimen Description   Final    BLOOD LEFT ANTECUBITAL Performed at [place] [address], [place], [place] [address]    Special Requests   Final    BOTTLES DRAWN AEROBIC ONLY Blood Culture adequate volume Performed at [place] [address], [place], [place] [address]    Culture   Final    NO GROWTH 2 DAYS Performed at [place] [address] [place], [place] [address]    Report Status PENDING  Incomplete  Culture, blood (Routine X 2) w Reflex to ID Panel     Status: None (Preliminary result)   Collection Time: 06/01/20  4:38 PM   Specimen: BLOOD LEFT HAND  Result Value Ref Range Status   Specimen Description   Final    BLOOD LEFT HAND Performed at [place] [address], [place], [place] [address]    Special Requests   Final    BOTTLES DRAWN AEROBIC ONLY Blood Culture adequate volume Performed at [place] [address] [place], [place] [address]    Culture   Final    NO GROWTH 2 DAYS Performed at [place] [address] [place],  [address]    Report Status PENDING  Incomplete  MRSA PCR Screening     Status: None   Collection Time: 06/01/20  5:24 PM   Specimen: Nasal Mucosa; Nasopharyngeal  Result Value Ref Range Status   MRSA by PCR NEGATIVE NEGATIVE Final    Comment:        The GeneXpert MRSA Assay (FDA approved for NASAL specimens only), is one component of a comprehensive MRSA colonization surveillance program. It is not intended to diagnose MRSA infection nor to guide or  monitor treatment for MRSA infections. Performed at [facility], [place] [address] [place], [place] [address]   Urine culture     Status: None   Collection Time: 06/02/20 12:23 AM   Specimen: Urine, Catheterized    Result Value Ref Range Status   Specimen Description   Final    URINE, CATHETERIZED Performed at [place] [address], [place], [place] [address]    Special Requests   Final    NONE Performed at [facility], [place] [address], [place], [place] [address]    Culture   Final    NO GROWTH Performed at [facility], [place] [address], [place],  [address]    Report Status 06/03/2020 FINAL  Final         Radiology Studies: CT ABDOMEN PELVIS WO CONTRAST  Result Date: 06/02/2020 CLINICAL DATA:  History of pancreatic cancer with liver metastasis and ascites. Vomiting and weakness. Multiple recent paracentesis. Patient is deteriorating. To evaluate for perforation. EXAM: CT CHEST, ABDOMEN AND PELVIS WITHOUT CONTRAST TECHNIQUE: Multidetector CT imaging of the chest, abdomen and pelvis was performed following the standard protocol without IV contrast. COMPARISON:  CT abdomen and pelvis 05/30/2020.  CT chest 05/03/2020 FINDINGS: CT CHEST FINDINGS Cardiovascular: Normal heart size. No pericardial effusions. Normal caliber thoracic aorta. Scattered aortic and coronary artery calcifications. Right central venous catheter with tip in the low SVC. Mediastinum/Nodes: Esophagus is decompressed but fluid-filled, possibly indicating reflux or dysmotility. Pretracheal lymph node measures about 1.5 cm diameter. A paraesophageal lymph node at the abdominal hiatus measures about 1.9 cm diameter. Lymph nodes are similar in size to previous studies. Heterogeneous appearance of the thyroid gland with scattered calcifications. No significant nodularity. Lungs/Pleura: There is dense consolidation and airspace disease throughout the left lung and in the right lung base. Air bronchograms are present. Small pleural effusions. Changes could represent pneumonia or possibly aspiration. No pneumothorax. Musculoskeletal: Degenerative changes in the spine. No destructive bone  lesions. CT ABDOMEN PELVIS FINDINGS Hepatobiliary: The gallbladder is distended with thickened wall. A biliary stent is present without bile duct dilatation. Pancreas: Mass in the head of the pancreas without interval enlargement. Porta hepatis and celiac axis lymph nodes are enlarged, likely metastatic. No pancreatic ductal dilatation. Spleen: Normal in size without focal abnormality. Adrenals/Urinary Tract: 1.7 cm diameter left adrenal gland nodule. Additional 1.3 cm left adrenal gland nodule. No change in appearance. Low-attenuation suggests fat containing adenomas. Pelvic location of the right kidney. Kidneys appear otherwise symmetrical. No hydronephrosis. No stones. Bladder is unremarkable. Stomach/Bowel: Stomach, small bowel, and colon are not abnormally distended. Contrast material fills the colon. No free air or free contrast to suggest bowel perforation. Vascular/Lymphatic: Calcification of the aorta. No aneurysm. Retroperitoneal lymph nodes in the upper abdomen are mildly enlarged, similar prior study, likely metastatic. Reproductive: Prostate gland is enlarged. Other: Diffuse free fluid throughout the abdomen and pelvis, likely ascites. No free air. Edema in the subcutaneous fat. Musculoskeletal: No destructive bone lesions. IMPRESSION: 1. Dense consolidation and airspace disease throughout the left lung and in the right lung base. Small pleural effusions. Changes likely to represent pneumonia or possibly aspiration. 2. Fluid-filled esophagus, possibly indicating reflux or dysmotility. 3. Mass in the head of the pancreas without interval enlargement. 4. Porta hepatis, celiac axis, and retroperitoneal lymph nodes are enlarged, likely metastatic. 5. Diffuse free fluid throughout the abdomen and pelvis, likely ascites. 6. Bilateral adrenal gland nodules, likely metastatic. 7. Pelvic location of the right kidney.  8. Enlarged prostate gland. 9. Aortic atherosclerosis. 10. No evidence of bowel obstruction or  perforation. Aortic Atherosclerosis (ICD10-I70.0). Electronically Signed   By: Lucienne Capers M.D.   On: 06/02/2020 02:45   CT CHEST WO CONTRAST  Result Date: 06/02/2020 CLINICAL DATA:  History of pancreatic cancer with liver metastasis and ascites. Vomiting and weakness. Multiple recent paracentesis. Patient is deteriorating. To evaluate for perforation. EXAM: CT CHEST, ABDOMEN AND PELVIS WITHOUT CONTRAST TECHNIQUE: Multidetector CT imaging of the chest, abdomen and pelvis was performed following the standard protocol without IV contrast. COMPARISON:  CT abdomen and pelvis 05/30/2020.  CT chest 05/03/2020 FINDINGS: CT CHEST FINDINGS Cardiovascular: Normal heart size. No pericardial effusions. Normal caliber thoracic aorta. Scattered aortic and coronary artery calcifications. Right central venous catheter with tip in the low SVC. Mediastinum/Nodes: Esophagus is decompressed but fluid-filled, possibly indicating reflux or dysmotility. Pretracheal lymph node measures about 1.5 cm diameter. A paraesophageal lymph node at the abdominal hiatus measures about 1.9 cm diameter. Lymph nodes are similar in size to previous studies. Heterogeneous appearance of the thyroid gland with scattered calcifications. No significant nodularity. Lungs/Pleura: There is dense consolidation and airspace disease throughout the left lung and in the right lung base. Air bronchograms are present. Small pleural effusions. Changes could represent pneumonia or possibly aspiration. No pneumothorax. Musculoskeletal: Degenerative changes in the spine. No destructive bone lesions. CT ABDOMEN PELVIS FINDINGS Hepatobiliary: The gallbladder is distended with thickened wall. A biliary stent is present without bile duct dilatation. Pancreas: Mass in the head of the pancreas without interval enlargement. Porta hepatis and celiac axis lymph nodes are enlarged, likely metastatic. No pancreatic ductal dilatation. Spleen: Normal in size without focal  abnormality. Adrenals/Urinary Tract: 1.7 cm diameter left adrenal gland nodule. Additional 1.3 cm left adrenal gland nodule. No change in appearance. Low-attenuation suggests fat containing adenomas. Pelvic location of the right kidney. Kidneys appear otherwise symmetrical. No hydronephrosis. No stones. Bladder is unremarkable. Stomach/Bowel: Stomach, small bowel, and colon are not abnormally distended. Contrast material fills the colon. No free air or free contrast to suggest bowel perforation. Vascular/Lymphatic: Calcification of the aorta. No aneurysm. Retroperitoneal lymph nodes in the upper abdomen are mildly enlarged, similar prior study, likely metastatic. Reproductive: Prostate gland is enlarged. Other: Diffuse free fluid throughout the abdomen and pelvis, likely ascites. No free air. Edema in the subcutaneous fat. Musculoskeletal: No destructive bone lesions. IMPRESSION: 1. Dense consolidation and airspace disease throughout the left lung and in the right lung base. Small pleural effusions. Changes likely to represent pneumonia or possibly aspiration. 2. Fluid-filled esophagus, possibly indicating reflux or dysmotility. 3. Mass in the head of the pancreas without interval enlargement. 4. Porta hepatis, celiac axis, and retroperitoneal lymph nodes are enlarged, likely metastatic. 5. Diffuse free fluid throughout the abdomen and pelvis, likely ascites. 6. Bilateral adrenal gland nodules, likely metastatic. 7. Pelvic location of the right kidney. 8. Enlarged prostate gland. 9. Aortic atherosclerosis. 10. No evidence of bowel obstruction or perforation. Aortic Atherosclerosis (ICD10-I70.0). Electronically Signed   By: Lucienne Capers M.D.   On: 06/02/2020 02:45   DG CHEST PORT 1 VIEW  Result Date: 06/01/2020 CLINICAL DATA:  Shortness of breath status post EGD EXAM: PORTABLE CHEST 1 VIEW COMPARISON:  05/30/2020, CT 05/03/2020 FINDINGS: Right-sided central venous port tip over the SVC. The right lung is  grossly clear. Interim development of fairly extensive consolidation and ground-glass opacity throughout the left thorax. No pleural effusion. Stable cardiomediastinal silhouette. No pneumothorax. IMPRESSION: Development of fairly extensive consolidation and ground-glass opacity  throughout the left thorax, findings could be secondary to asymmetric edema, pulmonary hemorrhage, or diffuse pneumonia. Correlation with CT could be considered. Electronically Signed   By: Donavan Foil M.D.   On: 06/01/2020 16:57   DG Abd Portable 1V  Result Date: 06/03/2020 CLINICAL DATA:  Duodenal obstruction.  Evaluate stomach. EXAM: PORTABLE ABDOMEN - 1 VIEW COMPARISON:  05/31/2020 FINDINGS: Moderate gastric dilatation with progression from the prior study. No dilatation of the large and small bowel. Stent in the common bile duct again noted. Bibasilar airspace disease is extensive. IMPRESSION: Progressive gastric dilatation. Electronically Signed   By: Franchot Gallo M.D.   On: 06/03/2020 08:38        Scheduled Meds: . bisacodyl  10 mg Rectal Daily  . Chlorhexidine Gluconate Cloth  6 each Topical Daily  . enoxaparin (LOVENOX) injection  40 mg Subcutaneous Q24H  . hydrocortisone sod succinate (SOLU-CORTEF) inj  50 mg Intravenous Q8H  . insulin aspart  0-9 Units Subcutaneous Q4H  . ipratropium  0.5 mg Nebulization Q6H  . levalbuterol  0.63 mg Nebulization Q6H  . mouth rinse  15 mL Mouth Rinse BID  . pantoprazole (PROTONIX) IV  40 mg Intravenous Q12H  . sodium chloride flush  10-40 mL Intracatheter Q12H  . sodium chloride HYPERTONIC  4 mL Nebulization Daily   Continuous Infusions: . sodium chloride 100 mL/hr at 06/03/20 0805  . sodium chloride    . ceFEPime (MAXIPIME) IV Stopped (06/03/20 0944)  . chlorproMAZINE (THORAZINE) IV Stopped (06/02/20 0309)  . norepinephrine (LEVOPHED) Adult infusion 10 mcg/min (06/03/20 0805)  . potassium chloride       LOS: 4 days    Time spent: 40 min    Irine Seal, MD [facility]   To contact the attending provider between 7A-7P or the covering provider during after hours 7P-7A, please log into the web site [email or web address] and access using universal [place] password for that web site. If you do not have the password, please call the hospital operator.  06/03/2020, 10:26 AM

## 2020-06-03 NOTE — Progress Notes (Signed)
Jeffrey Harris is certainly more alert this morning. He is off one of the pressors.  I talked him at length about the problem that we have. 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 I thought that if there was extrinsic compression on the duodenum and possibly obstruction, the may be a stent could be placed open the area.  Unfortunately, this is some that has not surgically amenable to improvement.  Our goal here is quality of life. I really would like to get Jeffrey Harris back to eat. I just would hate to have him go on a J-tube and just use liquid nutrition.  I think will be a good idea would be radiation therapy to this area of obstruction. I would think that radiation should be able to cause some shrinkage that he would be able to eat again and be able to have nutrition that he would enjoy. With radiation, I would use chemotherapy at a low dose to make the radiation more effective.  He still wants to be aggressive. I totally understand this. I told him that even by treating this obstruction, we probably are not going to add much to his quantity of life. The radiation is clearly for quality of life. He does understand this.  I am unsure how the J-tube will get put in. I do not know if radiology would do this or if surgery would do this. He would really like to have something to take for nutrition. I tend to agree with this.  Hopefully, he will be off his pressor today.  His blood counts today show a sodium 133. Potassium 3.3. BUN 13 creatinine 0.58. Calcium 7.1. Albumin is 2.4. His white cell count is 19.7. Hemoglobin 10.6. Platelet count 90,000.  He is on Maxipime. So far cultures are negative.  I am going to check his prealbumin. This may give me an idea as to how well things will go in the future.  Again, he is always been aggressive with his care. He understands that he is not can to be cured. He just wants quality of life. A big goal for him is a trip in early  November. He is really looking forward to this. It would be nice if we could get him on this trip.  I know he is getting outstanding care from all the staff in the ICU. Hopefully, he will not be in the ICU for much longer.  Lattie Haw, MD  Darlyn Chamber 29:11

## 2020-06-04 ENCOUNTER — Inpatient Hospital Stay (HOSPITAL_COMMUNITY): Payer: BC Managed Care – PPO

## 2020-06-04 DIAGNOSIS — J9601 Acute respiratory failure with hypoxia: Secondary | ICD-10-CM | POA: Diagnosis not present

## 2020-06-04 DIAGNOSIS — E860 Dehydration: Secondary | ICD-10-CM | POA: Diagnosis not present

## 2020-06-04 DIAGNOSIS — J690 Pneumonitis due to inhalation of food and vomit: Secondary | ICD-10-CM | POA: Diagnosis not present

## 2020-06-04 LAB — CBC WITH DIFFERENTIAL/PLATELET
Abs Immature Granulocytes: 0.2 10*3/uL — ABNORMAL HIGH (ref 0.00–0.07)
Basophils Absolute: 0 10*3/uL (ref 0.0–0.1)
Basophils Relative: 0 %
Eosinophils Absolute: 0 10*3/uL (ref 0.0–0.5)
Eosinophils Relative: 0 %
HCT: 29.2 % — ABNORMAL LOW (ref 39.0–52.0)
Hemoglobin: 10.1 g/dL — ABNORMAL LOW (ref 13.0–17.0)
Immature Granulocytes: 2 %
Lymphocytes Relative: 5 %
Lymphs Abs: 0.6 10*3/uL — ABNORMAL LOW (ref 0.7–4.0)
MCH: 33.9 pg (ref 26.0–34.0)
MCHC: 34.6 g/dL (ref 30.0–36.0)
MCV: 98 fL (ref 80.0–100.0)
Monocytes Absolute: 0.4 10*3/uL (ref 0.1–1.0)
Monocytes Relative: 4 %
Neutro Abs: 10 10*3/uL — ABNORMAL HIGH (ref 1.7–7.7)
Neutrophils Relative %: 89 %
Platelets: 79 10*3/uL — ABNORMAL LOW (ref 150–400)
RBC: 2.98 MIL/uL — ABNORMAL LOW (ref 4.22–5.81)
RDW: 20 % — ABNORMAL HIGH (ref 11.5–15.5)
WBC: 11.2 10*3/uL — ABNORMAL HIGH (ref 4.0–10.5)
nRBC: 0 % (ref 0.0–0.2)

## 2020-06-04 LAB — COMPREHENSIVE METABOLIC PANEL
ALT: 21 U/L (ref 0–44)
AST: 32 U/L (ref 15–41)
Albumin: 2.2 g/dL — ABNORMAL LOW (ref 3.5–5.0)
Alkaline Phosphatase: 140 U/L — ABNORMAL HIGH (ref 38–126)
Anion gap: 9 (ref 5–15)
BUN: 14 mg/dL (ref 8–23)
CO2: 22 mmol/L (ref 22–32)
Calcium: 7.3 mg/dL — ABNORMAL LOW (ref 8.9–10.3)
Chloride: 102 mmol/L (ref 98–111)
Creatinine, Ser: 0.48 mg/dL — ABNORMAL LOW (ref 0.61–1.24)
GFR calc Af Amer: >60 mL/min (ref >60)
GFR calc non Af Amer: >60 mL/min (ref >60)
Glucose, Bld: 129 mg/dL — ABNORMAL HIGH (ref 70–99)
Potassium: 3.7 mmol/L (ref 3.5–5.1)
Sodium: 133 mmol/L — ABNORMAL LOW (ref 135–145)
Total Bilirubin: 1.5 mg/dL — ABNORMAL HIGH (ref 0.3–1.2)
Total Protein: 4.9 g/dL — ABNORMAL LOW (ref 6.5–8.1)

## 2020-06-04 LAB — PROTIME-INR
INR: 2.2 — ABNORMAL HIGH (ref 0.8–1.2)
Prothrombin Time: 23.3 seconds — ABNORMAL HIGH (ref 11.4–15.2)

## 2020-06-04 LAB — PROCALCITONIN: Procalcitonin: 11.49 ng/mL

## 2020-06-04 LAB — GLUCOSE, CAPILLARY
Glucose-Capillary: 128 mg/dL — ABNORMAL HIGH (ref 70–99)
Glucose-Capillary: 123 mg/dL — ABNORMAL HIGH (ref 70–99)
Glucose-Capillary: 105 mg/dL — ABNORMAL HIGH (ref 70–99)
Glucose-Capillary: 111 mg/dL — ABNORMAL HIGH (ref 70–99)
Glucose-Capillary: 97 mg/dL (ref 70–99)
Glucose-Capillary: 92 mg/dL (ref 70–99)

## 2020-06-04 LAB — LEGIONELLA PNEUMOPHILA SEROGP 1 UR AG: L. pneumophila Serogp 1 Ur Ag: NEGATIVE

## 2020-06-04 MED ORDER — VITAMIN K1 10 MG/ML IJ SOLN
10.0000 mg | Freq: Every day | INTRAMUSCULAR | Status: AC
  Administered 2020-06-04 – 2020-06-06 (×3): 10 mg via SUBCUTANEOUS
  Filled 2020-06-04 (×3): qty 1

## 2020-06-04 MED ORDER — IPRATROPIUM BROMIDE 0.02 % IN SOLN
0.5000 mg | Freq: Three times a day (TID) | RESPIRATORY_TRACT | Status: DC
  Administered 2020-06-04 – 2020-06-07 (×10): 0.5 mg via RESPIRATORY_TRACT
  Filled 2020-06-04 (×10): qty 2.5

## 2020-06-04 MED ORDER — LIDOCAINE HCL 1 % IJ SOLN
INTRAMUSCULAR | Status: AC
  Filled 2020-06-04: qty 20

## 2020-06-04 MED ORDER — LEVALBUTEROL HCL 0.63 MG/3ML IN NEBU
0.6300 mg | INHALATION_SOLUTION | Freq: Three times a day (TID) | RESPIRATORY_TRACT | Status: DC
  Administered 2020-06-04 – 2020-06-07 (×10): 0.63 mg via RESPIRATORY_TRACT
  Filled 2020-06-04 (×10): qty 3

## 2020-06-04 MED ORDER — ALBUMIN HUMAN 25 % IV SOLN
25.0000 g | Freq: Once | INTRAVENOUS | Status: AC
  Administered 2020-06-04: 25 g via INTRAVENOUS
  Filled 2020-06-04: qty 100

## 2020-06-04 MED ORDER — DIPHENHYDRAMINE HCL 50 MG/ML IJ SOLN
12.5000 mg | Freq: Once | INTRAMUSCULAR | Status: AC
  Administered 2020-06-04: 12.5 mg via INTRAVENOUS
  Filled 2020-06-04: qty 1

## 2020-06-04 NOTE — Procedures (Signed)
PROCEDURE SUMMARY:  Successful image-guided paracentesis from the left lower abdomen.  Yielded 3.4 liters of clear yellow fluid.  No immediate complications.  EBL: zero Patient tolerated well.   Specimen was not sent for labs.  Please see imaging section of Epic for full dictation.  Joaquim Nam PA-C 06/04/2020 12:59 PM

## 2020-06-04 NOTE — Progress Notes (Signed)
Mr. Schiller is still on pressors.  The dose is coming down slowly.  He was seen by Radiation Oncology.  They will start radiation on Monday.  As far as radiosensitizing chemotherapy, I may just consider mitomycin-C.  I know this is very "old-fashioned" but it is just 1 dose and this might be reasonable to give, given the issues we have with trying to give chemotherapy.  As far as a feeding tube, I am not sure what this will get put in.  Surgery is following this.  They would like to see him more stable.  I totally understand this.  Looks like he has had some ascites coming back.  His abdomen looks to be more full.  He feels a little bit more distended.  We will see about a paracentesis.  His labs show white cell count 11.2.  Hemoglobin 10.1.  Platelet count 79,000.  His BUN is 14 creatinine 0.8.  Calcium 7.3.  Albumin 2.2.  So far, cultures are negative.  His vital signs are temperature 97.8.  Pulse 71.  Blood pressure 116/64.  His lungs sound pretty clear bilaterally.  Cardiac exam regular rate and rhythm.  He has no murmurs.  Abdomen is a little distended.  He has decent bowel sounds.  He has no guarding or rebound tenderness.  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  Radiation therapy clearly is just palliative.  Our goal is to try to get him to eat again so he can enjoy solid food.  I will see about mitomycin-C.  Again I know this is an old-fashioned medicine but it does work well as a Market researcher.  We will see about a paracentesis today.  He is very impressed by the care he gets down in the ICU.  He is very appreciative of the compassion that the nurses and staff have for him.  Lattie Haw, MD  [place] 14:14

## 2020-06-04 NOTE — Progress Notes (Signed)
PROGRESS NOTE    Jeffrey Harris  YYT:[id number] DOB: 01-10-1956 DOA: 05/29/2020 PCP: [name], MD    Chief Complaint  Patient presents with  . 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 Marin Olp, history of gout presented to [facility], ED with generalized weakness, intractable nausea and vomiting.  Patient reported that for the past 4 weeks he has been bouts of nausea and vomiting.  Described as nonbilious, nonbloody vomiting typically after attempting to eat solids.  Patient ported experiencing numerous episodes daily.  Also reported generalized weakness, lightheadedness upon standing up.  Denied any fevers, diarrhea, sick contacts, recent travel.  Recently diagnosed with malignant ascites, undergone paracentesis 3 times since diagnosed on September 3.  Patient last received Gemzar and Abraxane on 9/7 Patient was found to be profoundly volume depleted in ED with moderate hyponatremia, severe hypokalemia, hypochloremia and alkalosis.  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  Postprocedure patient seemed to be doing fine however patient was transferred to the floor and noted to be tachycardic initially and subsequently hypotensive, significant pallor, cough.  Concern for septic shock versus acute bleed versus possible perforation as patient underwent recent procedure with a large mass noted.  Patient transferred to the ICU, currently on Levophed drip.  Neo-Synephrine drip was weaned off 06/02/2020.  Patient also noted on IV Solu-Cortef.  Hypotension improved.  Decreased IV fluids to 100 cc/h. Chest x-ray done concerning for left-sided pneumonia.  Patient noted to have a elevated lactic acid level of 5.6 which has trended down to 1.6, elevated procalcitonin which is trending down.  Blood cultures pending.  Urine strep pneumococcus antigen negative. Urine Legionella antigen pending.  Urinalysis nitrite negative, leukocytes negative.  CT abdomen and pelvis negative for any perforation.  CT chest which was done with dense consolidation and airspace disease throughout the left lung and in the right lung base, small pleural effusions, fluid-filled esophagus possibly indicating reflux or dysmotility, mass in the head of the pancreas without interval enlargement, porta hepatis, celiac axis and retroperitoneal lymph nodes enlarged likely metastatic, diffuse free fluid throughout the abdomen and pelvis likely ascites, bilateral adrenal gland nodules likely metastatic, enlarged prostate gland, no evidence of bowel obstruction or perforation.  Continue empiric IV cefepime.  IV vancomycin discontinued.  Continue Mucinex, Xopenex, Atrovent nebs.  PCCM following and I appreciate input and recommendations.  2 acute respiratory failure with hypoxia secondary to aspiration pneumonia Patient noted to be severe sepsis with septic shock with shortness of breath post endoscopy 06/01/2020.  Chest x-ray CT chest done with a left-sided pneumonia and right basilar pneumonia.  Patient with  improvement with  respiratory status and currently on 4 L nasal cannula from 8 L high flow nasal cannula from 15 L high flow.  Urine strep pneumococcus antigen negative.  Urine Legionella antigen pending.  Sputum Gram stain and cultures pending.  Improving clinically.  Continue empiric IV cefepime, Xopenex and Atrovent nebs.  IV vancomycin discontinued.  PCCM consulted and are following.  3 metastatic pancreatic cancer to the liver with recent diagnosis of malignant ascites status post paracentesis/obstructing mass Patient being followed by oncology, Dr. Marin Olp last received chemotherapy with Gemzar and Abraxane 05/10/2020.  Patient seen by gastroenterology due to concerns for nausea and vomiting, abdominal x-ray done showed no high-grade obstructive bowel gas pattern, no free air, moderate colonic stool burden with CT abdomen and pelvis done showing persistent enlargement of pancreatic head with distal CBD stent in place.  Upper GI series showed moderate to marked partial duodenal obstruction at the level of the pancreatic tumor and stent.  Patient underwent upper endoscopy 06/01/2020, which revealed a large obstructing duodenal mass.  Gastroenterology recommended general surgery consultation for placement of feeding jejunostomy tube.  General surgery consulted and are following and ordered plain films of the abdomen to assess how large his stomach is to determine whether NG tube temporary for decompression may be helpful.  NG tube attempted 7 times per patient yesterday with no success.  Patient seen by radiation oncology and patient underwent simulation 06/03/2020 and patient to begin palliative radiation on 06/06/2020 to see whether tumor burden causing obstruction can be shrunk in conjunction with chemotherapy.  Per radiation/oncology and oncology.  Palliative care following.   4.  Severe electrolyte  abnormalities/hypokalemia/hyponatremia/hypochloremia secondary to GI losses with intractable nausea and  vomiting On admission patient noted to have a potassium of 2.4, sodium of 127, chloride less than 65, CO2 of 42.  Patient placed on aggressive IV fluid hydration, potassium supplementation.  Serum osmolality at 264, urine osmolality of 682, urine sodium of 144 concern for possible SIADH component due to malignancy.  Abdominal films done with no high-grade obstructive bowel gas pattern, no free air, moderate colonic stool burden.  CT abdomen showed persistent enlargement of pancreatic head with a distal CBD stent in place.  GI was consulted upper GI series showed marked partial duodenal obstruction at the level of pancreatic tumor stent.  Patient underwent upper endoscopy this morning that showed a large obstructing duodenal mass.  Patient currently n.p.o.  General surgery consulted to assess for placement of feeding jejunostomy tube.  Replete electrolytes.  Follow.  5.  Severe dehydration Continue gentle hydration.   6.  Malignant ascites Last paracentesis 05/23/2020.  Patient with complaints of abdominal fullness.  Ultrasound-guided paracentesis ordered by oncology this morning.  We will give a dose of IV albumin x1.  Per oncology.    7.  Prolonged QT interval EKG showed a prolonged QTC of 527 on admission.  Patient noted to have severe electrolyte abnormalities.  Repeat EKG with resolution of QT prolongation.  Keep potassium >4, magnesium >2.  Avoid QT prolongation medications.  Follow.  8.  Constipation Continue daily Dulcolax suppositories.  It is noted patient refusing suppository.  9.  Hyponatremia/hypokalemia Secondary to hypovolemic hyponatremia.  Improved with hydration.  Sodium level at 133.  Potassium at 3.7 after replacement.  Repeat labs in the morning.   10.  Failure to thrive Secondary to metastatic pancreatic cancer.  Palliative care following.  Patient currently n.p.o. secondary to large obstructing pancreatic mass.  General surgery consulted for placement of jejunostomy tube.  Tube placement on hold due to current respiratory status.  Prealbumin ordered per oncology.  We will give a dose of IV albumin prior to paracentesis today.  Follow.   DVT prophylaxis: SCDs Code Status: Full Family Communication: Updated patient.  No family at bedside.  Disposition:   Status is: Inpatient    Dispo: The patient is from: Home              Anticipated d/c is to: To be determined              Anticipated d/c date is: To be determined.              Patient with septic shock secondary to aspiration pneumonia, on 1 pressors, on IV antibiotics, metastatic pancreatic cancer with obstruction.  Not stable for discharge.       Consultants:   General surgery: Dr. Hassell Done 06/01/2020  Palliative care: Dr. Rowe Pavy Jun 01, 2020  Gastroenterology: Dr. Collene Mares May 30, 2020  Oncology: Dr. Marin Olp May 30, 2020  PCCM: Dr Ander Slade 06/01/2020  Procedures:  Upper endoscopy Jun 01, 2020  CT abdomen and pelvis May 30, 2020  Chest x-ray May 30, 2020, May 31, 2020, Jun 01, 2020  CT abdomen and pelvis 06/01/2020  CT chest 06/01/2020  Abdominal films 06/03/2020  Ultrasound-guided paracentesis pending 06/04/2020  Antimicrobials:   IV cefepime Jun 01, 2020>>>>  IV vancomycin Jun 01, 2020>>>>> 06/02/2020   Subjective: Patient laying in bed.  Complaining of some abdominal fullness.  Denies any chest pain.  Feels shortness of breath is improving.  Currently on 4 L nasal cannula with sats ranging from 88 to 91%.  Currently on Levophed drip.  Off Neo-Synephrine drip.  Patient states was seen by radiation oncology and to start radiation treatment on Monday.  Objective: Vitals:   06/04/20 0600 06/04/20 0615 06/04/20 0826 06/04/20 0837  BP:      Pulse: 72 71    Resp: (!) 25 18    Temp:    97.6 F (36.4 C)  TempSrc:    Oral  SpO2: 90% 93% 92%   Weight:      Height:        Intake/Output Summary (Last 24 hours) at 06/04/2020 1049 Last data filed at 06/04/2020 0000 Gross per 24 hour    Intake 2141.11 ml  Output 1000 ml  Net 1141.11 ml   Filed Weights   05/31/20 0232 06/01/20 1128  Weight: 78 kg 78 kg    Examination:  General exam: Alert. Respiratory system: Improvement with coarse breath sounds.  No wheezing.  Fair air movement.  Speaking in full sentences.  On 4 L nasal cannula with sats ranging from 88 to 91%.  Cardiovascular system: RRR no murmurs rubs or gallops.  No JVD.  No lower extremity edema.   Gastrointestinal system: Abdomen is nontender, nondistended, soft, positive bowel sounds.  No rebound.  No guarding.  Central nervous system: Alert and oriented. No focal neurological deficits. Extremities: Symmetric 5 x 5 power. Skin: No rashes, lesions or ulcers Psychiatry: Judgement and insight appear normal. Mood & affect somewhat depressed and flattened.     Data Reviewed: I have personally reviewed following labs and imaging studies  CBC: Recent Labs  Lab 05/31/20 0930 05/31/20 0930 06/01/20 0351 06/01/20 2142 06/02/20 0453 06/03/20 0335 06/04/20 0400  WBC 7.2   < > 9.9 33.0* 25.9* 19.7* 11.2*  NEUTROABS 5.8  --   --  30.6* 24.0* 18.1* 10.0*  HGB 12.2*   < >  11.1* 13.8 12.0* 10.6* 10.1*  HCT 35.7*   < > 32.9* 40.0 34.4* 30.4* 29.2*  MCV 98.1   < > 98.2 99.0 98.0 97.1 98.0  PLT 142*   < > 113* 149* 101* 90* 79*   < > = values in this interval not displayed.    Basic Metabolic Panel: Recent Labs  Lab 05/29/20 2247 05/30/20 0614 05/31/20 0930 05/31/20 0930 06/01/20 0351 06/01/20 1652 06/02/20 0453 06/03/20 0335 06/04/20 0400  NA 127*   < > 125*   < > 127* 122* 128* 133* 133*  K 2.4*   < > 3.2*   < > 3.0* 3.9 3.9 3.3* 3.7  CL <65*   < > 80*   < > 82* 81* 91* 98 102  CO2 42*   < > 34*   < > 34* 25 20* 21* 22  GLUCOSE 118*   < > 95   < > 98 98 184* 187* 129*  BUN 19   < > 12   < > 8 10 15 13 14   CREATININE 1.01   < > 0.59*   < > 0.61 0.95 1.00 0.58* 0.48*  CALCIUM 8.6*   < > 7.3*   < > 7.6* 7.3* 7.0* 7.1* 7.3*  MG 2.4  --  2.3  --   2.3 2.0 2.2  --   --    < > = values in this interval not displayed.    GFR: Estimated Creatinine Clearance: 103.3 mL/min (A) (by C-G formula based on SCr of 0.48 mg/dL (L)).  Liver Function Tests: Recent Labs  Lab 05/31/20 0930 06/01/20 1652 06/02/20 0453 06/03/20 0335 06/04/20 0400  AST 34 35 33 35 32  ALT 20 18 18 23 21   ALKPHOS 168* 179* 125 113 140*  BILITOT 1.3* 2.0* 2.0* 1.9* 1.5*  PROT 5.6* 5.5* 5.1* 5.1* 4.9*  ALBUMIN 2.7* 2.5* 2.6* 2.4* 2.2*    CBG: Recent Labs  Lab 06/03/20 1531 06/03/20 1940 06/03/20 2350 06/04/20 0404 06/04/20 0753  GLUCAP 120* 133* 120* 128* 123*     Recent Results (from the past 240 hour(s))  Respiratory Panel by RT PCR (Flu A&B, Covid) - Nasopharyngeal Swab     Status: None   Collection Time: 05/30/20  1:40 AM   Specimen: Nasopharyngeal Swab  Result Value Ref Range Status   SARS Coronavirus 2 by RT PCR NEGATIVE NEGATIVE Final    Comment: (NOTE) SARS-CoV-2 target nucleic acids are NOT DETECTED.  The SARS-CoV-2 RNA is generally detectable in upper respiratoy specimens during the acute phase of infection. The lowest concentration of SARS-CoV-2 viral copies this assay can detect is 131 copies/mL. A negative result does not preclude SARS-Cov-2 infection and should not be used as the sole basis for treatment or other patient management decisions. A negative result may occur with  improper specimen collection/handling, submission of specimen other than nasopharyngeal swab, presence of viral mutation(s) within the areas targeted by this assay, and inadequate number of viral copies (<131 copies/mL). A negative result must be combined with clinical observations, patient history, and epidemiological information. The expected result is Negative.  Fact Sheet for Patients:  PinkCheek.be  Fact Sheet for Healthcare Providers:  GravelBags.it  This test is no t yet approved or  cleared by the [place] FDA and  has been authorized for detection and/or diagnosis of SARS-CoV-2 by FDA under an Emergency Use Authorization (EUA). This EUA will remain  in effect (meaning this test can be used) for the duration of  the COVID-19 declaration under Section 564(b)(1) of the Act, 21 U.S.C. section 360bbb-3(b)(1), unless the authorization is terminated or revoked sooner.     Influenza A by PCR NEGATIVE NEGATIVE Final   Influenza B by PCR NEGATIVE NEGATIVE Final    Comment: (NOTE) The Xpert Xpress SARS-CoV-2/FLU/RSV assay is intended as an aid in  the diagnosis of influenza from Nasopharyngeal swab specimens and  should not be used as a sole basis for treatment. Nasal washings and  aspirates are unacceptable for Xpert Xpress SARS-CoV-2/FLU/RSV  testing.  Fact Sheet for Patients: PinkCheek.be  Fact Sheet for Healthcare Providers: GravelBags.it  This test is not yet approved or cleared by the [place] FDA and  has been authorized for detection and/or diagnosis of SARS-CoV-2 by  FDA under an Emergency Use Authorization (EUA). This EUA will remain  in effect (meaning this test can be used) for the duration of the  Covid-19 declaration under Section 564(b)(1) of the Act, 21  U.S.C. section 360bbb-3(b)(1), unless the authorization is  terminated or revoked. Performed at [facility], [place] [address], [place], [place] [address]   Culture, sputum-assessment     Status: None   Collection Time: 06/01/20  3:34 PM   Specimen: Sputum  Result Value Ref Range Status   Specimen Description SPU  Final   Special Requests NONE  Final   Sputum evaluation   Final    THIS SPECIMEN IS ACCEPTABLE FOR SPUTUM CULTURE Performed at [facility], [place] [address], [place], [place] [address]    Report Status 06/01/2020 FINAL  Final  Culture, respiratory     Status: None   Collection Time:  06/01/20  3:34 PM   Specimen: Sputum  Result Value Ref Range Status   Specimen Description   Final    SPU Performed at [place] [address], [place], [place] [address]    Special Requests   Final    NONE Reflexed from [id number] Performed at [facility], [place] [address] [place], [place] [address]    Gram Stain   Final    FEW WBC PRESENT,BOTH PMN AND MONONUCLEAR RARE GRAM NEGATIVE RODS MODERATE GRAM POSITIVE COCCI RARE YEAST    Culture   Final    FEW Normal respiratory flora-no Staph aureus or Pseudomonas seen Performed at [facility], [address], [place], [place] [address]    Report Status 06/03/2020 FINAL  Final  Culture, blood (Routine X 2) w Reflex to ID Panel     Status: None (Preliminary result)   Collection Time: 06/01/20  4:38 PM   Specimen: BLOOD  Result Value Ref Range Status   Specimen Description   Final    BLOOD LEFT ANTECUBITAL Performed at [place] [address] [place], [place] [address]    Special Requests   Final    BOTTLES DRAWN AEROBIC ONLY Blood Culture adequate volume Performed at [place] [address], [place], [place] [address]    Culture   Final    NO GROWTH 3 DAYS Performed at [facility], [place] [address] [place], [place] [address]    Report Status PENDING  Incomplete  Culture, blood (Routine X 2) w Reflex to ID Panel     Status: None (Preliminary result)   Collection Time: 06/01/20  4:38 PM   Specimen: BLOOD LEFT HAND  Result Value Ref Range Status   Specimen Description   Final    BLOOD  LEFT HAND Performed at [facility], [place] [address], [place], [place] [address]    Special Requests   Final    BOTTLES DRAWN AEROBIC ONLY Blood Culture adequate volume Performed at [place] [address] [place], [place] [address]    Culture   Final    NO GROWTH 3 DAYS Performed at [facility], [place] [address], [place], [place] [address]    Report Status PENDING  Incomplete  MRSA PCR Screening     Status: None   Collection Time: 06/01/20  5:24 PM   Specimen: Nasal Mucosa; Nasopharyngeal  Result Value Ref Range Status   MRSA by PCR NEGATIVE NEGATIVE Final    Comment:        The GeneXpert MRSA Assay (FDA approved for NASAL specimens only), is one component of a comprehensive MRSA colonization surveillance program. It is not intended to diagnose MRSA infection nor to guide or monitor treatment for MRSA infections. Performed at [facility], [place] [address], [place], [place] [address]   Urine culture     Status: None   Collection Time: 06/02/20 12:23 AM   Specimen: Urine, Catheterized  Result Value Ref Range Status   Specimen Description   Final    URINE, CATHETERIZED Performed at [place] [address], [place], [place] [address]    Special Requests   Final    NONE Performed at [facility], [place] [address], [place], [place] [address]    Culture   Final    NO GROWTH Performed at [facility], [place] [address] [place], [place] [address]    Report Status 06/03/2020 FINAL  Final         Radiology Studies: DG Abd Portable 1V  Result Date: 06/03/2020 CLINICAL DATA:  Duodenal obstruction.  Evaluate stomach. EXAM: PORTABLE ABDOMEN - 1 VIEW COMPARISON:  05/31/2020 FINDINGS: Moderate gastric dilatation with progression from the prior study. No dilatation of the large and small bowel. Stent in the common bile duct again noted. Bibasilar airspace disease is extensive. IMPRESSION: Progressive gastric dilatation. Electronically Signed   By: Franchot Gallo M.D.   On: 06/03/2020 08:38   DG [place] Dys Tube Plc W/Fl W/Rad  Result Date: 06/03/2020 CLINICAL DATA:  Attempted NG tube placement on the floor. EXAM: NASO G TUBE PLACEMENT WITH FL AND WITH RAD FLUOROSCOPY TIME:  Fluoroscopy Time:  18 seconds  Radiation Exposure Index (if provided by the fluoroscopic device): 1.1 mGy Number of Acquired Spot Images: 0 COMPARISON:  None. FINDINGS: Three attempts were made after numbing of the nasal passages and lubricating a 10 French nasogastric tube. We were unable to advance the tube beyond the nasal cavity. It may be that endoscopic means may be necessary for placement. Assessment of previous maxillofacial CT shows very limited clearance between turbinates and nasal septum which may account for this difficulty. IMPRESSION: Unsuccessful nasogastric tube placement as described. Electronically Signed   By: Zetta Bills M.D.   On: 06/03/2020 16:39        Scheduled Meds: . bisacodyl  10 mg Rectal Daily  . Chlorhexidine Gluconate Cloth  6 each Topical Daily  . enoxaparin (LOVENOX) injection  40 mg Subcutaneous Q24H  . insulin aspart  0-9 Units Subcutaneous Q4H  . ipratropium  0.5 mg Nebulization TID  . levalbuterol  0.63 mg Nebulization TID  . mouth rinse  15 mL Mouth Rinse BID  . pantoprazole (PROTONIX) IV  40 mg  Intravenous Q12H  . phytonadione  10 mg Subcutaneous Daily  . sodium chloride flush  10-40 mL Intracatheter Q12H   Continuous Infusions: . sodium chloride Stopped (06/03/20 2337)  . sodium chloride    . ceFEPime (MAXIPIME) IV 2 g (06/04/20 0916)  . chlorproMAZINE (THORAZINE) IV Stopped (06/02/20 0309)  . norepinephrine (LEVOPHED) Adult infusion 4 mcg/min (06/04/20 0000)     LOS: 5 days    Time spent: 40 min    Irine Seal, MD [facility]   To contact the attending provider between 7A-7P or the covering provider during after hours 7P-7A, please log into the web site [email or web address] and access using universal [place] password for that web site. If you do not have the password, please call the hospital operator.  06/04/2020, 10:49 AM

## 2020-06-04 NOTE — Progress Notes (Signed)
Pharmacy Antibiotic Note  Jeffrey Harris is a 64 y.o. male admitted on 05/29/2020 with intractable nausea and vomiting.  PMH significant for pancreatic cancer with mets to liver, and malignant ascites.  Pharmacy has been consulted for Cefepime dosing for possible pneumonia/productive cough & sepsis. 06/04/2020  D#3/7 Cefepime No positive cultures AF WBC 19.7> 11.2 SCr WNL PCT 23> 12   Plan: Cefepime 2gm IV q8h> stop date in Epic for 10/5 Pharmacy to sign off  Height: 5\' 11"  (180.3 cm) Weight: 78 kg (171 lb 15.3 oz) IBW/kg (Calculated) : 75.3  Temp (24hrs), Avg:97.7 F (36.5 C), Min:97.3 F (36.3 C), Max:98.4 F (36.9 C)  Recent Labs  Lab 05/30/20 1248 05/31/20 0930 06/01/20 0351 06/01/20 1652 06/01/20 2142 06/02/20 0453 06/03/20 0335 06/04/20 0400  WBC  --    < > 9.9  --  33.0* 25.9* 19.7* 11.2*  CREATININE  --    < > 0.61 0.95  --  1.00 0.58* 0.48*  LATICACIDVEN 0.9  --   --  5.6* 3.0*  --  1.6  --    < > = values in this interval not displayed.    Estimated Creatinine Clearance: 103.3 mL/min (A) (by C-G formula based on SCr of 0.48 mg/dL (L)).    No Known Allergies   Antimicrobials this admission:  9/29 Cefepime >>  10/6 9/29 Vanc >>  9/30 9/27 flagyl>9/28 Dose adjustments this admission:  Microbiology results:  9/29 BCx: ngtd 9/30 UCx: ngF 9/29 Sputum: normal flora F 9/29 MRSA PCR: negative 9/30 strep pneumo: negative  Thank you for allowing pharmacy to be a part of this patient's care.  Eudelia Bunch, Pharm.D 06/04/2020 8:53 AM

## 2020-06-04 NOTE — Progress Notes (Signed)
NAME:  Jeffrey Harris, MRN:  [id number], DOB:  03/12/1956, LOS: 5 ADMISSION DATE:  05/29/2020, CONSULTATION DATE:  06/01/2020 REFERRING MD:  Dr Grandville Silos, CHIEF COMPLAINT:  shock   Brief History   Asked to see patient for shock, unresponsive to fluids Had endoscopy 9/29 showing a large obstructing duodenal mass  Patient with metastatic pancreatic cancer Carcinomatosis abdominis Found to have an obstructing pancreatic mass  Diagnosed with pancreatic cancer-diagnosed November 2020, has had 1 course of chemotherapy-last chemo 05/10/2020, scheduled for next course with Dr. Marin Olp  Past Medical History   Past Medical History:  Diagnosis Date  . Goals of care, counseling/discussion 07/29/2019  . Gout   . Hyperbilirubinemia 07/2019  . Pancreatic cancer metastasized to liver ([place]) 07/29/2019   Significant Hospital Events   Hypotension, altered mental status S/p EGD 06/01/2020->PCCM asked to see when as found altered w/ hypotension 9/30 still on pressors. Still on high FIO2. Added stress dose steroids 10/1 oxygen and pressor requirements better 10/2 continues to improve, not off IV pressors yet  Post paracentesis10/2 for 3.4 L  Consults:  PCCM GI Surgery  Procedures:  EGD 06/01/2020 CT chest: 1. Dense consolidation and airspace disease throughout the left lung and in the right lung base. Small pleural effusions. Changes likely to represent pneumonia or possibly aspiration.2. Fluid-filled esophagus, possibly indicating reflux or dysmotility. 3. Mass in the head of the pancreas without interval enlargement. 4. Porta hepatis, celiac axis, and retroperitoneal lymph nodes are enlarged, likely metastatic.5. Diffuse free fluid throughout the abdomen and pelvis, likely ascites.6. Bilateral adrenal gland nodules, likely metastatic.7. Pelvic location of the right kidney.8. Enlarged prostate gland. 9. Aortic atherosclerosis.10. No evidence of bowel obstruction or perforation. CT abd/pelvis: 1.  Dense consolidation and airspace disease throughout the left lung and in the right lung base. Small pleural effusions. Changes likely to represent pneumonia or possibly aspiration. 2. Fluid-filled esophagus, possibly indicating reflux or dysmotility. 3. Mass in the head of the pancreas without interval enlargement. 4. Porta hepatis, celiac axis, and retroperitoneal lymph nodes are enlarged, likely metastatic.5. Diffuse free fluid throughout the abdomen and pelvis, likely ascites.6. Bilateral adrenal gland nodules, likely metastatic.7. Pelvic location of the right kidney.8. Enlarged prostate gland. 9. Aortic atherosclerosis.10. No evidence of bowel obstruction or perforation.  Significant Diagnostic Tests:  Chest x-ray with extensive consolidation left lung field  Micro Data:  None Blood culture 06/01/2020 Sputum culture 06/01/2020 Antimicrobials:  Cefepime 9/29>>> vanc x 1 day (9/28)  Interim history/subjective:  Weaning pressors Weaning oxygen  Feels better post paracentesis  Objective   Blood pressure 116/64, pulse 71, temperature (!) 97.3 F (36.3 C), temperature source Oral, resp. rate 18, height 5\' 11"  (1.803 m), weight 78 kg, SpO2 92 %.        Intake/Output Summary (Last 24 hours) at 06/04/2020 1433 Last data filed at 06/04/2020 0000 Gross per 24 hour  Intake 1559.5 ml  Output 700 ml  Net 859.5 ml   Filed Weights   05/31/20 0232 06/01/20 1128  Weight: 78 kg 78 kg    Examination: Post paracentesis and feeling well No use of accessory muscles of respiration   Resolved Hospital Problem list    Lactic acidosis   Assessment & Plan:  Septic shock in setting of aspiration pneumonia -Continue to wean IV pressors -Continue stress dose steroids -Continue antibiotics, complete at 7 days  Acute hypoxic respiratory failure in the setting of aspiration pneumonia -Pulmonary toileting -Wean off FiO2  Metastatic pancreatic cancer with obstructing duodenal  mass -Malignant ascites  s/p paracentesis today  Fluid and electrolyte imbalance -Continue to replete  Hyperglycemia -SSI   Patient of Dr. Edrick Kins  Best practice:  Diet: N.p.o. Pain/Anxiety/Delirium protocol (if indicated): Per primary VAP protocol (if indicated): Not indicated DVT prophylaxis: Lovenox GI prophylaxis: Protonix Glucose control:  Mobility: Bedrest Code Status: Full code Family Communication: Discussed with spouse Disposition: ICU, weaning pressors. Oxygen better   Sherrilyn Rist, MD [place] [facility] Pager: [phone]

## 2020-06-05 ENCOUNTER — Inpatient Hospital Stay (HOSPITAL_COMMUNITY): Payer: BC Managed Care – PPO

## 2020-06-05 DIAGNOSIS — E860 Dehydration: Secondary | ICD-10-CM | POA: Diagnosis not present

## 2020-06-05 DIAGNOSIS — Z7189 Other specified counseling: Secondary | ICD-10-CM | POA: Diagnosis not present

## 2020-06-05 DIAGNOSIS — J9601 Acute respiratory failure with hypoxia: Secondary | ICD-10-CM | POA: Diagnosis not present

## 2020-06-05 DIAGNOSIS — R531 Weakness: Secondary | ICD-10-CM | POA: Diagnosis not present

## 2020-06-05 DIAGNOSIS — Z515 Encounter for palliative care: Secondary | ICD-10-CM | POA: Diagnosis not present

## 2020-06-05 DIAGNOSIS — J690 Pneumonitis due to inhalation of food and vomit: Secondary | ICD-10-CM | POA: Diagnosis not present

## 2020-06-05 LAB — COMPREHENSIVE METABOLIC PANEL
ALT: 23 U/L (ref 0–44)
AST: 39 U/L (ref 15–41)
Albumin: 2.4 g/dL — ABNORMAL LOW (ref 3.5–5.0)
Alkaline Phosphatase: 212 U/L — ABNORMAL HIGH (ref 38–126)
Anion gap: 11 (ref 5–15)
BUN: 15 mg/dL (ref 8–23)
CO2: 21 mmol/L — ABNORMAL LOW (ref 22–32)
Calcium: 7.7 mg/dL — ABNORMAL LOW (ref 8.9–10.3)
Chloride: 106 mmol/L (ref 98–111)
Creatinine, Ser: 0.47 mg/dL — ABNORMAL LOW (ref 0.61–1.24)
GFR calc Af Amer: >60 mL/min (ref >60)
GFR calc non Af Amer: >60 mL/min (ref >60)
Glucose, Bld: 105 mg/dL — ABNORMAL HIGH (ref 70–99)
Potassium: 3.2 mmol/L — ABNORMAL LOW (ref 3.5–5.1)
Sodium: 138 mmol/L (ref 135–145)
Total Bilirubin: 1.9 mg/dL — ABNORMAL HIGH (ref 0.3–1.2)
Total Protein: 5.1 g/dL — ABNORMAL LOW (ref 6.5–8.1)

## 2020-06-05 LAB — PHOSPHORUS: Phosphorus: 1.2 mg/dL — ABNORMAL LOW (ref 2.5–4.6)

## 2020-06-05 LAB — GLUCOSE, CAPILLARY
Glucose-Capillary: 108 mg/dL — ABNORMAL HIGH (ref 70–99)
Glucose-Capillary: 115 mg/dL — ABNORMAL HIGH (ref 70–99)
Glucose-Capillary: 120 mg/dL — ABNORMAL HIGH (ref 70–99)
Glucose-Capillary: 140 mg/dL — ABNORMAL HIGH (ref 70–99)
Glucose-Capillary: 131 mg/dL — ABNORMAL HIGH (ref 70–99)

## 2020-06-05 LAB — CBC WITH DIFFERENTIAL/PLATELET
Abs Immature Granulocytes: 0.06 10*3/uL (ref 0.00–0.07)
Basophils Absolute: 0 10*3/uL (ref 0.0–0.1)
Basophils Relative: 0 %
Eosinophils Absolute: 0 10*3/uL (ref 0.0–0.5)
Eosinophils Relative: 0 %
HCT: 32.4 % — ABNORMAL LOW (ref 39.0–52.0)
Hemoglobin: 11.2 g/dL — ABNORMAL LOW (ref 13.0–17.0)
Immature Granulocytes: 1 %
Lymphocytes Relative: 9 %
Lymphs Abs: 0.9 10*3/uL (ref 0.7–4.0)
MCH: 33.8 pg (ref 26.0–34.0)
MCHC: 34.6 g/dL (ref 30.0–36.0)
MCV: 97.9 fL (ref 80.0–100.0)
Monocytes Absolute: 0.8 10*3/uL (ref 0.1–1.0)
Monocytes Relative: 7 %
Neutro Abs: 8.8 10*3/uL — ABNORMAL HIGH (ref 1.7–7.7)
Neutrophils Relative %: 83 %
Platelets: 116 10*3/uL — ABNORMAL LOW (ref 150–400)
RBC: 3.31 MIL/uL — ABNORMAL LOW (ref 4.22–5.81)
RDW: 20.1 % — ABNORMAL HIGH (ref 11.5–15.5)
WBC: 10.6 10*3/uL — ABNORMAL HIGH (ref 4.0–10.5)
nRBC: 0 % (ref 0.0–0.2)

## 2020-06-05 LAB — PROCALCITONIN: Procalcitonin: 3.94 ng/mL

## 2020-06-05 LAB — MAGNESIUM: Magnesium: 1.8 mg/dL (ref 1.7–2.4)

## 2020-06-05 MED ORDER — BISACODYL 10 MG RE SUPP
10.0000 mg | Freq: Every day | RECTAL | Status: DC | PRN
  Administered 2020-06-09 (×2): 10 mg via RECTAL
  Filled 2020-06-05 (×2): qty 1

## 2020-06-05 MED ORDER — FUROSEMIDE 10 MG/ML IJ SOLN
20.0000 mg | Freq: Once | INTRAMUSCULAR | Status: AC
  Administered 2020-06-05: 20 mg via INTRAVENOUS
  Filled 2020-06-05: qty 2

## 2020-06-05 MED ORDER — POTASSIUM CHLORIDE 10 MEQ/100ML IV SOLN
10.0000 meq | INTRAVENOUS | Status: AC
  Administered 2020-06-05 (×5): 10 meq via INTRAVENOUS
  Filled 2020-06-05 (×5): qty 100

## 2020-06-05 MED ORDER — POTASSIUM PHOSPHATES 15 MMOLE/5ML IV SOLN
30.0000 mmol | Freq: Once | INTRAVENOUS | Status: AC
  Administered 2020-06-05: 30 mmol via INTRAVENOUS
  Filled 2020-06-05: qty 10

## 2020-06-05 MED ORDER — MAGNESIUM SULFATE 2 GM/50ML IV SOLN
2.0000 g | Freq: Once | INTRAVENOUS | Status: AC
  Administered 2020-06-05: 2 g via INTRAVENOUS
  Filled 2020-06-05: qty 50

## 2020-06-05 NOTE — Progress Notes (Signed)
Daily Progress Note   Patient Name: Jeffrey Harris       Date: 06/05/2020 DOB: 1955-11-26  Age: 64 y.o. MRN#: [id number] Attending Physician: Eugenie Filler, MD Primary Care Physician: [name], MD Admit Date: 05/29/2020  Reason for Consultation/Follow-up: Establishing goals of care  Subjective: Patient is awake alert, resting in bed, complains of distension in his stomach, has some belching and burping going on. Discussed with bedside RN,    Patient denies pain any where, he is aware of radiation starting soon.  Length of Stay: 6  Current Medications: Scheduled Meds:  . Chlorhexidine Gluconate Cloth  6 each Topical Daily  . enoxaparin (LOVENOX) injection  40 mg Subcutaneous Q24H  . insulin aspart  0-9 Units Subcutaneous Q4H  . ipratropium  0.5 mg Nebulization TID  . levalbuterol  0.63 mg Nebulization TID  . mouth rinse  15 mL Mouth Rinse BID  . pantoprazole (PROTONIX) IV  40 mg Intravenous Q12H  . phytonadione  10 mg Subcutaneous Daily  . sodium chloride flush  10-40 mL Intracatheter Q12H    Continuous Infusions: . sodium chloride 10 mL/hr at 06/05/20 1103  . sodium chloride    . ceFEPime (MAXIPIME) IV 2 g (06/05/20 1113)  . chlorproMAZINE (THORAZINE) IV Stopped (06/02/20 0309)  . magnesium sulfate bolus IVPB    . norepinephrine (LEVOPHED) Adult infusion 6 mcg/min (06/05/20 0000)  . potassium chloride    . potassium PHOSPHATE IVPB (in mmol) 30 mmol (06/05/20 1122)    PRN Meds: acetaminophen **OR** acetaminophen, alum & mag hydroxide-simeth, bisacodyl, chlorproMAZINE (THORAZINE) IV, prochlorperazine, sodium chloride flush  Physical Exam         Awake alert resting in bed Patient has high O2 requirements but currently off NRB mask, currently on L HF [place] with 91% O2  sat Diminished breath sounds Regular rate and rhythm Awake alert oriented Nonfocal  Vital Signs: BP 95/61   Pulse 79   Temp 98 F (36.7 C) (Axillary)   Resp (!) 28   Ht 5\' 11"  (1.803 m)   Wt 87.5 kg   SpO2 96%   BMI 26.90 kg/m  SpO2: SpO2: 96 % O2 Device: O2 Device: NRB O2 Flow Rate: O2 Flow Rate (L/min): 15 L/min  Intake/output summary:   Intake/Output Summary (Last 24 hours) at 06/05/2020 1150 Last data filed  at 06/05/2020 0000 Gross per 24 hour  Intake 2586.14 ml  Output 3825 ml  Net -1238.86 ml   LBM: Last BM Date: 05/31/20 Baseline Weight: Weight: 78 kg Most recent weight: Weight: 87.5 kg       Palliative Assessment/Data:    Palliative performance scale 50%  Patient Active Problem List   Diagnosis Date Noted  . Hypophosphatemia 06/05/2020  . Acute respiratory failure with hypoxia ([place])   . Aspiration pneumonia due to gastric secretions ([place])   . Palliative care by specialist   . General weakness   . Constipation   . Severe sepsis ([place]) 06/01/2020  . Hypochloremia   . Pancreatic cancer metastasized to intra-abdominal lymph node ([place])   . Septic shock ([place])   . Intractable nausea and vomiting 05/30/2020  . Dehydration with hyponatremia 05/30/2020  . Hypokalemia due to excessive gastrointestinal loss of potassium 05/30/2020  . Malignant ascites 05/30/2020  . Prolonged QT interval 05/30/2020  . Hypotension 05/30/2020  . Drug-induced neutropenia ([place]) 03/10/2020  . Genetic testing 01/01/2020  . Goals of care, counseling/discussion 07/29/2019  . Pancreatic cancer metastasized to liver ([place]) 07/29/2019  . Hyponatremia   . Elevated LFTs   . Jaundice   . Obstructive jaundice   . Hyperbilirubinemia 07/23/2019  . Elevated liver enzymes 07/23/2019  . History of gout 07/23/2019  . Obesity (BMI 30-39.9) 07/23/2019    Palliative Care Assessment & Plan   Patient Profile:    Assessment: 64 year old gentleman with life limiting illness of metastatic  pancreatic cancer, obstructing duodenal mass Acute hypoxic respiratory failure aspiration pneumonia   Recommendations/Plan:  Continue current mode of care.  Palliative care will follow peripherally. Please call [phone] if we can be of further assistance. Patient being followed closely by med onc, PCCM rad onc and surgery.  Goals of Care and Additional Recommendations:  Limitations on Scope of Treatment: Full Scope Treatment  Code Status:    Code Status Orders  (From admission, onward)         Start     Ordered   05/30/20 0638  Full code  Continuous        05/30/20 0644        Code Status History    Date Active Date Inactive Code Status Order ID Comments User Context   07/23/2019 1501 07/29/2019 2021 Full Code [id number]  Norval Morton, MD ED   Advance Care Planning Activity       Prognosis:   Unable to determine Guarded.  Discharge Planning:  To Be Determined Monitor hospital course and overall disease trajectory of illness to help guide disposition planning.   Care plan was discussed with  Patient   RN.     Thank you for allowing the Palliative Medicine Team to assist in the care of this patient.   Time In: 9 Time Out: 9.25 Total Time 25 Prolonged Time Billed  no       Greater than 50%  of this time was spent counseling and coordinating care related to the above assessment and plan.  Loistine Chance, MD  Please contact Palliative Medicine Team phone at [phone] for questions and concerns.

## 2020-06-05 NOTE — Progress Notes (Signed)
NAME:  Jeffrey Harris, MRN:  [id number], DOB:  Dec 20, 1955, LOS: 6 ADMISSION DATE:  05/29/2020, CONSULTATION DATE:  06/01/2020 REFERRING MD:  Dr Grandville Silos, CHIEF COMPLAINT:  shock   Brief History   Asked to see patient for shock, unresponsive to fluids Had endoscopy 9/29 showing a large obstructing duodenal mass  Patient with metastatic pancreatic cancer Carcinomatosis abdominis Found to have an obstructing pancreatic mass  Diagnosed with pancreatic cancer-diagnosed November 2020, has had 1 course of chemotherapy-last chemo 05/10/2020, scheduled for next course with Dr. Marin Olp  Past Medical History   Past Medical History:  Diagnosis Date  . Goals of care, counseling/discussion 07/29/2019  . Gout   . Hyperbilirubinemia 07/2019  . Pancreatic cancer metastasized to liver ([place]) 07/29/2019   Significant Hospital Events   Hypotension, altered mental status S/p EGD 06/01/2020->PCCM asked to see when as found altered w/ hypotension 9/30 still on pressors. Still on high FIO2. Added stress dose steroids 10/1 oxygen and pressor requirements better 10/2 continues to improve, not off IV pressors yet  Post paracentesis10/2 for 3.4 L 10/3 remains on pressors  Consults:  PCCM GI Surgery  Procedures:  EGD 06/01/2020 CT chest: 1. Dense consolidation and airspace disease throughout the left lung and in the right lung base. Small pleural effusions. Changes likely to represent pneumonia or possibly aspiration.2. Fluid-filled esophagus, possibly indicating reflux or dysmotility. 3. Mass in the head of the pancreas without interval enlargement. 4. Porta hepatis, celiac axis, and retroperitoneal lymph nodes are enlarged, likely metastatic.5. Diffuse free fluid throughout the abdomen and pelvis, likely ascites.6. Bilateral adrenal gland nodules, likely metastatic.7. Pelvic location of the right kidney.8. Enlarged prostate gland. 9. Aortic atherosclerosis.10. No evidence of bowel obstruction or  perforation. CT abd/pelvis: 1. Dense consolidation and airspace disease throughout the left lung and in the right lung base. Small pleural effusions. Changes likely to represent pneumonia or possibly aspiration. 2. Fluid-filled esophagus, possibly indicating reflux or dysmotility. 3. Mass in the head of the pancreas without interval enlargement. 4. Porta hepatis, celiac axis, and retroperitoneal lymph nodes are enlarged, likely metastatic.5. Diffuse free fluid throughout the abdomen and pelvis, likely ascites.6. Bilateral adrenal gland nodules, likely metastatic.7. Pelvic location of the right kidney.8. Enlarged prostate gland. 9. Aortic atherosclerosis.10. No evidence of bowel obstruction or perforation.  Significant Diagnostic Tests:  Chest x-ray with extensive consolidation left lung field Chest x-ray 10/3-some improvement in infiltrates  Micro Data:  None Blood culture 06/01/2020 Sputum culture 06/01/2020 Antimicrobials:  Cefepime 9/29>>> vanc x 1 day (9/28)   Interim history/subjective:  Weaning pressors Better post paracentesis Still requiring oxygen supplementation  Objective   Blood pressure 95/61, pulse 79, temperature 98 F (36.7 C), temperature source Axillary, resp. rate (!) 28, height 5\' 11"  (1.803 m), weight 87.5 kg, SpO2 96 %.        Intake/Output Summary (Last 24 hours) at 06/05/2020 1149 Last data filed at 06/05/2020 0000 Gross per 24 hour  Intake 2586.14 ml  Output 3825 ml  Net -1238.86 ml   Filed Weights   05/31/20 0232 06/01/20 1128 06/05/20 0746  Weight: 78 kg 78 kg 87.5 kg    Examination: Middle-age gentleman, does not appear to be in distress Moist oral mucosa Rhonchi bilaterally Bowel sounds appreciated   Resolved Hospital Problem list    Lactic acidosis   Assessment & Plan:  Septic shock in the setting of aspiration pneumonia -Continue to wean IV pressors -Continue antibiotics Complete 7 days of antibiotics  Acute hypoxic respiratory  failure in the setting  of aspiration pneumonia Pulmonary toileting Wean FiO2 down as tolerated  Metastatic pancreatic cancer with obstructing duodenal mass -Malignant ascites s/p thoracentesis, last one on 10/2  Fluid and electrolyte imbalance -Continue to replete  Hypoglycemia -SSI  Discussed with Dr. Grandville Silos Patient of Dr. Serita Sheller, MD [place] [facility] Pager: [phone]

## 2020-06-05 NOTE — Progress Notes (Signed)
PROGRESS NOTE    Jeffrey Harris  NLZ:[id number] DOB: 01/13/1956 DOA: 05/29/2020 PCP: [name], MD    Chief Complaint  Patient presents with  . 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 Marin Olp, history of gout presented to [facility], ED with generalized weakness, intractable nausea and vomiting.  Patient reported that for the past 4 weeks he has been bouts of nausea and vomiting.  Described as nonbilious, nonbloody vomiting typically after attempting to eat solids.  Patient ported experiencing numerous episodes daily.  Also reported generalized weakness, lightheadedness upon standing up.  Denied any fevers, diarrhea, sick contacts, recent travel.  Recently diagnosed with malignant ascites, undergone paracentesis 3 times since diagnosed on September 3.  Patient last received Gemzar and Abraxane on 9/7 Patient was found to be profoundly volume depleted in ED with moderate hyponatremia, severe hypokalemia, hypochloremia and alkalosis.  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  Postprocedure patient seemed to be doing fine however patient was transferred to the floor and noted to be tachycardic initially and subsequently hypotensive, significant pallor, cough.  Concern for septic shock versus acute bleed versus possible perforation as patient underwent recent procedure with a large mass noted.  Patient transferred to the ICU, currently on Levophed drip.  Neo-Synephrine drip was weaned off 06/02/2020.  Patient also noted on IV Solu-Cortef which has subsequently been discontinued.  Hypotension improved however blood pressure soft and fluctuating.  KVO IV fluids due to concerns for possible volume overload from aggressive fluid resuscitation.  Repeat chest x-ray with mild improvement of extensive left lung airspace disease with persistent moderate disease in the right lung base which may reflect pneumonia. Patient noted to have a elevated lactic acid level of 5.6 which has trended down to 1.6, elevated procalcitonin which is trending down.  Blood cultures pending.  Urine strep pneumococcus antigen negative. Urine Legionella antigen negative.  Urinalysis nitrite negative, leukocytes negative.  CT abdomen and pelvis negative for any perforation.  CT chest which was done with dense consolidation and airspace disease throughout the left lung and in the right lung base, small pleural effusions, fluid-filled esophagus possibly indicating reflux or dysmotility, mass in the head of the pancreas without interval enlargement, porta hepatis, celiac axis and retroperitoneal lymph nodes enlarged likely metastatic, diffuse free fluid throughout the abdomen and pelvis likely ascites, bilateral adrenal gland nodules likely metastatic, enlarged prostate gland, no evidence of bowel obstruction or perforation.  Continue empiric IV cefepime to complete a 7-day course of treatment.  IV vancomycin discontinued.  Continue Mucinex, Xopenex, Atrovent nebs.  PCCM following and I  appreciate input and  recommendations.  2 acute respiratory failure with hypoxia secondary to aspiration pneumonia Patient noted to be severe sepsis with septic shock with shortness of breath post endoscopy 06/01/2020.  Chest x-ray CT chest done with a left-sided pneumonia and right basilar pneumonia.  Patient with some initial improvement with respiratory status however noted to have increased O2 requirements overnight.  Currently on 6 L nasal cannula with nonrebreather from 4 L nasal cannula from 8 L high flow nasal cannula from 15 L high flow.  Urine strep pneumococcus antigen negative.  Urine Legionella antigen negative.[name]  Sputum Gram stain and cultures with rare gram-negative rods, moderate gram-positive cocci, rare yeast.  Lasix 20 mg IV x1 ordered per PCCM.  KVO IV fluids.  Continue empiric IV cefepime, Xopenex and Atrovent nebs.  IV vancomycin discontinued.  PCCM consulted and are following.  3 metastatic pancreatic cancer to the liver with recent diagnosis of malignant ascites status post paracentesis/obstructing mass Patient being followed by oncology, Dr. Marin Olp last received chemotherapy with Gemzar and Abraxane 05/10/2020.  Patient seen by gastroenterology due to concerns for nausea and vomiting, abdominal x-ray done showed no high-grade obstructive bowel gas pattern, no free air, moderate colonic stool burden with CT abdomen and pelvis done showing persistent enlargement of pancreatic head with distal CBD stent in place.  Upper GI series showed moderate to marked partial duodenal obstruction at the level of the pancreatic tumor and stent.  Patient underwent upper endoscopy 06/01/2020, which revealed a large obstructing duodenal mass.  Gastroenterology recommended general surgery consultation for placement of feeding jejunostomy tube.  General surgery consulted and are following and ordered plain films of the abdomen to assess how large his stomach is to determine whether NG tube temporary for  decompression may be helpful.  NG tube attempted 7 times per patient with no success.  Patient seen by radiation oncology and patient underwent simulation 06/03/2020 and patient to begin palliative radiation on 06/06/2020 to see whether tumor burden causing obstruction can be shrunk in conjunction with chemotherapy.  Per radiation/oncology and oncology.  Palliative care following.   4.  Severe electrolyte  abnormalities/hypokalemia/hyponatremia/hypochloremia secondary to GI losses with intractable nausea and vomiting On admission patient noted to have a potassium of 2.4, sodium of 127, chloride less than 65, CO2 of 42.  Patient placed on aggressive IV fluid hydration, potassium supplementation.  Serum osmolality at 264, urine osmolality of 682, urine sodium of 144 concern for possible SIADH component due to malignancy.  Abdominal films done with no high-grade obstructive bowel gas pattern, no free air, moderate colonic stool burden.  CT abdomen showed persistent enlargement of pancreatic head with a distal CBD stent in place.  GI was consulted upper GI series showed marked partial duodenal obstruction at the level of pancreatic tumor stent.  Patient underwent upper endoscopy this morning that showed a large obstructing duodenal mass.  Patient currently n.p.o.  General surgery consulted to assess for placement of feeding jejunostomy tube.  Replete electrolytes.  Follow.  5.  Severe dehydration Some concern for volume overload.  KVO IV fluids.  Lasix 20 mg IV x1 ordered per PCCM.  6.  Malignant ascites Last paracentesis 05/23/2020.  Patient with complaints of abdominal fullness and subsequently underwent ultrasound-guided paracentesis with 3.4 L removed on 06/04/2020.  Patient did receive a dose of IV albumin prior to paracentesis.  Per oncology.  7.  Prolonged QT interval EKG showed a prolonged QTC of 527 on admission.  Patient noted to have severe electrolyte abnormalities.  Repeat EKG  with resolution of QT  prolongation.  Keep potassium >4, magnesium >2.  Avoid QT prolongation medications.  Follow.  8.  Constipation Patient stated had bowel movement yesterday.  Passing gas.  Patient noted to be refusing Dulcolax suppositories.  Continue daily Dulcolax suppositories.  Will change Dulcolax suppository to as needed.  Follow.    9.  Hyponatremia/hypokalemia Secondary to hypovolemic hyponatremia.  Improved with hydration.  Sodium level at 138.  Potassium currently at 3.2.  IV potassium ordered.  Magnesium 2 g IV x1.  Repeat labs in the morning.   10.  Failure to thrive Secondary to metastatic pancreatic cancer.  Palliative care following.  Patient currently n.p.o. secondary to large obstructing pancreatic mass.  General surgery consulted for placement of jejunostomy tube.  Tube placement on hold due to current respiratory status.  Prealbumin < 5.  Status post IV albumin prior to paracentesis on 06/04/2020.  Follow.    DVT prophylaxis: SCDs Code Status: Full Family Communication: Updated patient.  No family at bedside.  Disposition:   Status is: Inpatient    Dispo: The patient is from: Home              Anticipated d/c is to: To be determined              Anticipated d/c date is: To be determined.              Patient with septic shock secondary to aspiration pneumonia, on pressors, on IV antibiotics, metastatic pancreatic cancer with obstruction.  Not stable for discharge.       Consultants:   General surgery: Dr. Hassell Done 06/01/2020  Palliative care: Dr. Rowe Pavy Jun 01, 2020  Gastroenterology: Dr. Collene Mares May 30, 2020  Oncology: Dr. Marin Olp May 30, 2020  PCCM: Dr Ander Slade 06/01/2020  Procedures:  Upper endoscopy Jun 01, 2020  CT abdomen and pelvis May 30, 2020  Chest x-ray May 30, 2020, May 31, 2020, Jun 01, 2020  CT abdomen and pelvis 06/01/2020  CT chest 06/01/2020  Abdominal films 06/03/2020  Ultrasound-guided paracentesis 06/04/2020--- 3.4 L of clear yellow fluid removed,  Luther Parody, PA, IR  Antimicrobials:   IV cefepime Jun 01, 2020>>>> 06/08/2020  IV vancomycin Jun 01, 2020>>>>> 06/02/2020   Subjective: Patient laying in bed.  Patient noted to have increased O2 requirements overnight.  Patient also increasing dose of Levophed yesterday.  Patient denies any chest pain.  States some shortness of breath.  Complaining of some abdominal fullness.  Stated had 3.4 L removed with paracentesis yesterday with some clinical improvement.  States he is supposed to have a drain placed in his abdomen tomorrow to help drain some fluid.  Objective: Vitals:   06/05/20 0615 06/05/20 0630 06/05/20 0827 06/05/20 0901  BP:      Pulse: 77 79    Resp: (!) 24 (!) 28    Temp:   98 F (36.7 C)   TempSrc:   Axillary   SpO2: (!) 86% 90%  96%  Weight:      Height:        Intake/Output Summary (Last 24 hours) at 06/05/2020 1014 Last data filed at 06/05/2020 0000 Gross per 24 hour  Intake 2586.14 ml  Output 3825 ml  Net -1238.86 ml   Filed Weights   05/31/20 0232 06/01/20 1128  Weight: 78 kg 78 kg    Examination:  General exam: On 6 L nasal cannula and nonrebreather. Respiratory system: Some coarse breath sounds in the bases.  No wheezing.  Fair  air movement.  Speaking in full sentences.  On 6 L nasal cannula and nonrebreather. Cardiovascular system: Regular rate rhythm no murmurs rubs or gallops.  No JVD.  Some pedal edema. Gastrointestinal system: Abdomen is soft, nontender, nondistended, positive bowel sounds.  No rebound.  No guarding.  Central nervous system: Alert and oriented. No focal neurological deficits. Extremities: Symmetric 5 x 5 power. Skin: No rashes, lesions or ulcers Psychiatry: Judgement and insight appear normal. Mood & affect somewhat depressed and flattened.     Data Reviewed: I have personally reviewed following labs and imaging studies  CBC: Recent Labs  Lab 06/01/20 2142 06/02/20 0453 06/03/20 0335 06/04/20 0400 06/05/20 0423    WBC 33.0* 25.9* 19.7* 11.2* 10.6*  NEUTROABS 30.6* 24.0* 18.1* 10.0* 8.8*  HGB 13.8 12.0* 10.6* 10.1* 11.2*  HCT 40.0 34.4* 30.4* 29.2* 32.4*  MCV 99.0 98.0 97.1 98.0 97.9  PLT 149* 101* 90* 79* 116*    Basic Metabolic Panel: Recent Labs  Lab 05/31/20 0930 05/31/20 0930 06/01/20 0351 06/01/20 0351 06/01/20 1652 06/02/20 0453 06/03/20 0335 06/04/20 0400 06/05/20 0423  NA 125*   < > 127*   < > 122* 128* 133* 133* 138  K 3.2*   < > 3.0*   < > 3.9 3.9 3.3* 3.7 3.2*  CL 80*   < > 82*   < > 81* 91* 98 102 106  CO2 34*   < > 34*   < > 25 20* 21* 22 21*  GLUCOSE 95   < > 98   < > 98 184* 187* 129* 105*  BUN 12   < > 8   < > 10 15 13 14 15   CREATININE 0.59*   < > 0.61   < > 0.95 1.00 0.58* 0.48* 0.47*  CALCIUM 7.3*   < > 7.6*   < > 7.3* 7.0* 7.1* 7.3* 7.7*  MG 2.3  --  2.3  --  2.0 2.2  --   --  1.8  PHOS  --   --   --   --   --   --   --   --  1.2*   < > = values in this interval not displayed.    GFR: Estimated Creatinine Clearance: 103.3 mL/min (A) (by C-G formula based on SCr of 0.47 mg/dL (L)).  Liver Function Tests: Recent Labs  Lab 06/01/20 1652 06/02/20 0453 06/03/20 0335 06/04/20 0400 06/05/20 0423  AST 35 33 35 32 39  ALT 18 18 23 21 23   ALKPHOS 179* 125 113 140* 212*  BILITOT 2.0* 2.0* 1.9* 1.5* 1.9*  PROT 5.5* 5.1* 5.1* 4.9* 5.1*  ALBUMIN 2.5* 2.6* 2.4* 2.2* 2.4*    CBG: Recent Labs  Lab 06/04/20 1634 06/04/20 1945 06/04/20 2250 06/05/20 0355 06/05/20 0757  GLUCAP 111* 97 92 108* 115*     Recent Results (from the past 240 hour(s))  Respiratory Panel by RT PCR (Flu A&B, Covid) - Nasopharyngeal Swab     Status: None   Collection Time: 05/30/20  1:40 AM   Specimen: Nasopharyngeal Swab  Result Value Ref Range Status   SARS Coronavirus 2 by RT PCR NEGATIVE NEGATIVE Final    Comment: (NOTE) SARS-CoV-2 target nucleic acids are NOT DETECTED.  The SARS-CoV-2 RNA is generally detectable in upper respiratoy specimens during the acute phase of  infection. The lowest concentration of SARS-CoV-2 viral copies this assay can detect is 131 copies/mL. A negative result does not preclude SARS-Cov-2 infection and should not be  used as the sole basis for treatment or other patient management decisions. A negative result may occur with  improper specimen collection/handling, submission of specimen other than nasopharyngeal swab, presence of viral mutation(s) within the areas targeted by this assay, and inadequate number of viral copies (<131 copies/mL). A negative result must be combined with clinical observations, patient history, and epidemiological information. The expected result is Negative.  Fact Sheet for Patients:  PinkCheek.be  Fact Sheet for Healthcare Providers:  GravelBags.it  This test is no t yet approved or cleared by the [place] FDA and  has been authorized for detection and/or diagnosis of SARS-CoV-2 by FDA under an Emergency Use Authorization (EUA). This EUA will remain  in effect (meaning this test can be used) for the duration of the COVID-19 declaration under Section 564(b)(1) of the Act, 21 U.S.C. section 360bbb-3(b)(1), unless the authorization is terminated or revoked sooner.     Influenza A by PCR NEGATIVE NEGATIVE Final   Influenza B by PCR NEGATIVE NEGATIVE Final    Comment: (NOTE) The Xpert Xpress SARS-CoV-2/FLU/RSV assay is intended as an aid in  the diagnosis of influenza from Nasopharyngeal swab specimens and  should not be used as a sole basis for treatment. Nasal washings and  aspirates are unacceptable for Xpert Xpress SARS-CoV-2/FLU/RSV  testing.  Fact Sheet for Patients: PinkCheek.be  Fact Sheet for Healthcare Providers: GravelBags.it  This test is not yet approved or cleared by the [place] FDA and  has been authorized for detection and/or diagnosis of SARS-CoV-2  by  FDA under an Emergency Use Authorization (EUA). This EUA will remain  in effect (meaning this test can be used) for the duration of the  Covid-19 declaration under Section 564(b)(1) of the Act, 21  U.S.C. section 360bbb-3(b)(1), unless the authorization is  terminated or revoked. Performed at [facility], [place] [address] [place], [place] [address]   Culture, sputum-assessment     Status: None   Collection Time: 06/01/20  3:34 PM   Specimen: Sputum  Result Value Ref Range Status   Specimen Description SPU  Final   Special Requests NONE  Final   Sputum evaluation   Final    THIS SPECIMEN IS ACCEPTABLE FOR SPUTUM CULTURE Performed at [facility], [place] [address] [place], [place] [address]    Report Status 06/01/2020 FINAL  Final  Culture, respiratory     Status: None   Collection Time: 06/01/20  3:34 PM   Specimen: Sputum  Result Value Ref Range Status   Specimen Description   Final    SPU Performed at [place] [address], [place], [place] [address]    Special Requests   Final    NONE Reflexed from [id number] Performed at [facility], [place] [address], [place], [place] [address]    Gram Stain   Final    FEW WBC PRESENT,BOTH PMN AND MONONUCLEAR RARE GRAM NEGATIVE RODS MODERATE GRAM POSITIVE COCCI RARE YEAST    Culture   Final    FEW Normal respiratory flora-no Staph aureus or Pseudomonas seen Performed at [facility], [address], [place], [place] [address]    Report Status 06/03/2020 FINAL  Final  Culture, blood (Routine X 2) w Reflex to ID Panel     Status: None (Preliminary result)   Collection Time: 06/01/20  4:38 PM   Specimen: BLOOD  Result Value Ref Range Status   Specimen Description   Final  BLOOD LEFT ANTECUBITAL Performed at [place] [address] [place], [place] [address]    Special Requests   Final    BOTTLES DRAWN AEROBIC ONLY Blood  Culture adequate volume Performed at [place] [address], [place], [place] [address]    Culture   Final    NO GROWTH 3 DAYS Performed at [facility], [place] [address], [place], [place] [address]    Report Status PENDING  Incomplete  Culture, blood (Routine X 2) w Reflex to ID Panel     Status: None (Preliminary result)   Collection Time: 06/01/20  4:38 PM   Specimen: BLOOD LEFT HAND  Result Value Ref Range Status   Specimen Description   Final    BLOOD LEFT HAND Performed at [place] [address] [place], [place] [address]    Special Requests   Final    BOTTLES DRAWN AEROBIC ONLY Blood Culture adequate volume Performed at [place] [address], [place], [place] [address]    Culture   Final    NO GROWTH 3 DAYS Performed at [facility], [place] [address] [place], [place] [address]    Report Status PENDING  Incomplete  MRSA PCR Screening     Status: None   Collection Time: 06/01/20  5:24 PM   Specimen: Nasal Mucosa; Nasopharyngeal  Result Value Ref Range Status   MRSA by PCR NEGATIVE NEGATIVE Final    Comment:        The GeneXpert MRSA Assay (FDA approved for NASAL specimens only), is one component of a comprehensive MRSA colonization surveillance program. It is not intended to diagnose MRSA infection nor to guide or monitor treatment for MRSA infections. Performed at [facility], [place] [address], [place], [place] [address]   Urine culture     Status: None   Collection Time: 06/02/20 12:23 AM   Specimen: Urine, Catheterized  Result Value Ref Range Status   Specimen Description   Final    URINE, CATHETERIZED Performed at [place] [address], [place], [place] [address]    Special Requests   Final    NONE Performed at [facility], [place] [address] [place], [place] [address]    Culture   Final    NO  GROWTH Performed at [facility], [place] [address] [place], [place] [address]    Report Status 06/03/2020 FINAL  Final         Radiology Studies: US Paracentesis  Result Date: 06/04/2020 INDICATION: Patient with history of metastatic pancreatic cancer, recurrent ascites. Request to IR for therapeutic paracentesis. EXAM: ULTRASOUND GUIDED THERAPEUTIC PARACENTESIS MEDICATIONS: 10 mL 1% lidocaine COMPLICATIONS: None immediate. PROCEDURE: Informed written consent was obtained from the patient after a discussion of the risks, benefits and alternatives to treatment. A timeout was performed prior to the initiation of the procedure. Initial ultrasound scanning demonstrates a large amount of ascites within the left lower abdominal quadrant. The left lower abdomen was prepped and draped in the usual sterile fashion. 1% lidocaine was used for local anesthesia. Following this, a 19 gauge, 7-cm, Yueh catheter was introduced. An ultrasound image was saved for documentation purposes. The paracentesis was performed. The catheter was removed and a dressing was applied. The patient tolerated the procedure well without immediate post procedural complication. FINDINGS: A total of approximately 3.4 L of clear yellow fluid was removed. IMPRESSION: Successful ultrasound-guided paracentesis yielding 3.4  liters of peritoneal fluid. Read by Candiss Norse, PA-C Electronically Signed   By: Corrie Mckusick D.O.   On: 06/04/2020 13:00   DG Chest Port 1 View  Result Date: 06/05/2020 CLINICAL DATA:  Respiratory failure. EXAM: PORTABLE CHEST 1 VIEW COMPARISON:  Chest radiograph 06/01/2020 and CT 06/02/2020 FINDINGS: A right jugular Port-A-Cath terminates over the lower SVC. The cardiomediastinal silhouette is unchanged with normal heart size. Widespread airspace opacity throughout the left lung has mildly improved from the prior radiograph. Right basilar airspace opacity is new from the prior radiograph although dense  consolidation was present in this region on the interval CT. There may be persistent small bilateral pleural effusions. No pneumothorax is identified. IMPRESSION: Mild improvement of extensive left lung airspace disease with persistent milder disease in the right lung base which may reflect pneumonia. Electronically Signed   By: Logan Bores M.D.   On: 06/05/2020 05:57   DG [place] Dys Tube Plc W/Fl W/Rad  Result Date: 06/03/2020 CLINICAL DATA:  Attempted NG tube placement on the floor. EXAM: NASO G TUBE PLACEMENT WITH FL AND WITH RAD FLUOROSCOPY TIME:  Fluoroscopy Time:  18 seconds Radiation Exposure Index (if provided by the fluoroscopic device): 1.1 mGy Number of Acquired Spot Images: 0 COMPARISON:  None. FINDINGS: Three attempts were made after numbing of the nasal passages and lubricating a 10 French nasogastric tube. We were unable to advance the tube beyond the nasal cavity. It may be that endoscopic means may be necessary for placement. Assessment of previous maxillofacial CT shows very limited clearance between turbinates and nasal septum which may account for this difficulty. IMPRESSION: Unsuccessful nasogastric tube placement as described. Electronically Signed   By: Zetta Bills M.D.   On: 06/03/2020 16:39        Scheduled Meds: . bisacodyl  10 mg Rectal Daily  . Chlorhexidine Gluconate Cloth  6 each Topical Daily  . enoxaparin (LOVENOX) injection  40 mg Subcutaneous Q24H  . furosemide  20 mg Intravenous Once  . insulin aspart  0-9 Units Subcutaneous Q4H  . ipratropium  0.5 mg Nebulization TID  . levalbuterol  0.63 mg Nebulization TID  . mouth rinse  15 mL Mouth Rinse BID  . pantoprazole (PROTONIX) IV  40 mg Intravenous Q12H  . phytonadione  10 mg Subcutaneous Daily  . sodium chloride flush  10-40 mL Intracatheter Q12H   Continuous Infusions: . sodium chloride 100 mL/hr at 06/05/20 0000  . sodium chloride    . ceFEPime (MAXIPIME) IV Stopped (06/04/20 2326)  . chlorproMAZINE  (THORAZINE) IV Stopped (06/02/20 0309)  . magnesium sulfate bolus IVPB    . norepinephrine (LEVOPHED) Adult infusion 6 mcg/min (06/05/20 0000)  . potassium chloride    . potassium PHOSPHATE IVPB (in mmol)       LOS: 6 days    Time spent: 40 min    Irine Seal, MD [facility]   To contact the attending provider between 7A-7P or the covering provider during after hours 7P-7A, please log into the web site [email or web address] and access using universal [place] password for that web site. If you do not have the password, please call the hospital operator.  06/05/2020, 10:14 AM

## 2020-06-05 NOTE — Progress Notes (Signed)
Jeffrey Harris had more fluid taken off yesterday.  He had 3.4 L of fluid taken off his abdomen.  He still feels somewhat distended.  I am sure this is his stomach.  I suspect that he probably has a lot of fluid in his stomach that just cannot be moved through this obstruction.  He feels nauseated.  His prealbumin is less than 5.  This is a very ominous sign.  He still is on pressors.  His dose had to be increased a little bit.  He is on the nonrebreather.  His chest x-ray did look a little bit better from the aspect of aspiration.  He is supposed to start radiation tomorrow.  I think that he is going to be having trouble with all the fluid in his stomach.  I am unsure how this can be drained outside of put in an NG tube down him.  I do not think that the J-tube for feeding is going be put in anytime soon given his pulmonary status.  His white cell count 10.6.  Hemoglobin 11.2.  Platelet count 116,000.  His albumin is 2.4.  BUN is 15 creatinine 0.47.  Calcium 7.7.  On his physical exam, his blood pressure is 96/48.  Heart rate is 105.  Oxygen saturation is 88%.  His lungs sound a little congested.  He does have decent air movement bilaterally.  Cardiac exam tachycardic but regular.  Abdomen is not as distended.  Bowel sounds are present.  At this point, we really have to focus on his quality of life and palliation.  Again, the prealbumin less than 5 is just not a good indicator for his prognosis.  I will need to talk with him in the near future about the prealbumin and what it means.  I WILL do this.  I appreciate the outstanding care that he is getting from all the staff down in the ICU.  I know the staff is doing a fantastic job with him.  This is quite complicated.  Lattie Haw, MD  Psalm 37:18

## 2020-06-06 ENCOUNTER — Inpatient Hospital Stay (HOSPITAL_COMMUNITY): Payer: BC Managed Care – PPO

## 2020-06-06 ENCOUNTER — Ambulatory Visit
Admit: 2020-06-06 | Discharge: 2020-06-06 | Disposition: A | Discharge status: Home / Self Care | Payer: BC Managed Care – PPO | Attending: Radiation Oncology | Admitting: Radiation Oncology

## 2020-06-06 DIAGNOSIS — J9601 Acute respiratory failure with hypoxia: Secondary | ICD-10-CM | POA: Diagnosis not present

## 2020-06-06 DIAGNOSIS — E860 Dehydration: Secondary | ICD-10-CM | POA: Diagnosis not present

## 2020-06-06 DIAGNOSIS — R110 Nausea: Secondary | ICD-10-CM

## 2020-06-06 DIAGNOSIS — R6520 Severe sepsis without septic shock: Secondary | ICD-10-CM | POA: Diagnosis not present

## 2020-06-06 DIAGNOSIS — A419 Sepsis, unspecified organism: Secondary | ICD-10-CM | POA: Diagnosis not present

## 2020-06-06 DIAGNOSIS — J690 Pneumonitis due to inhalation of food and vomit: Secondary | ICD-10-CM | POA: Diagnosis not present

## 2020-06-06 LAB — COMPREHENSIVE METABOLIC PANEL
ALT: 22 U/L (ref 0–44)
AST: 35 U/L (ref 15–41)
Albumin: 2.3 g/dL — ABNORMAL LOW (ref 3.5–5.0)
Alkaline Phosphatase: 215 U/L — ABNORMAL HIGH (ref 38–126)
Anion gap: 9 (ref 5–15)
BUN: 12 mg/dL (ref 8–23)
CO2: 23 mmol/L (ref 22–32)
Calcium: 7.5 mg/dL — ABNORMAL LOW (ref 8.9–10.3)
Chloride: 102 mmol/L (ref 98–111)
Creatinine, Ser: 0.45 mg/dL — ABNORMAL LOW (ref 0.61–1.24)
GFR calc Af Amer: >60 mL/min (ref >60)
GFR calc non Af Amer: >60 mL/min (ref >60)
Glucose, Bld: 101 mg/dL — ABNORMAL HIGH (ref 70–99)
Potassium: 3.3 mmol/L — ABNORMAL LOW (ref 3.5–5.1)
Sodium: 134 mmol/L — ABNORMAL LOW (ref 135–145)
Total Bilirubin: 1.6 mg/dL — ABNORMAL HIGH (ref 0.3–1.2)
Total Protein: 5.2 g/dL — ABNORMAL LOW (ref 6.5–8.1)

## 2020-06-06 LAB — CBC WITH DIFFERENTIAL/PLATELET
Band Neutrophils: 0 %
Basophils Absolute: 0 10*3/uL (ref 0.0–0.1)
Basophils Relative: 0 %
Eosinophils Absolute: 0.1 10*3/uL (ref 0.0–0.5)
Eosinophils Relative: 2 %
HCT: 31.9 % — ABNORMAL LOW (ref 39.0–52.0)
Hemoglobin: 11.3 g/dL — ABNORMAL LOW (ref 13.0–17.0)
Lymphocytes Relative: 14 %
Lymphs Abs: 1.2 10*3/uL (ref 0.7–4.0)
MCH: 34.5 pg — ABNORMAL HIGH (ref 26.0–34.0)
MCHC: 35.4 g/dL (ref 30.0–36.0)
MCV: 97.3 fL (ref 80.0–100.0)
Monocytes Absolute: 0.9 10*3/uL (ref 0.1–1.0)
Monocytes Relative: 11 %
Neutro Abs: 6.2 10*3/uL (ref 1.7–7.7)
Neutrophils Relative %: 73 %
Platelets: 115 10*3/uL — ABNORMAL LOW (ref 150–400)
RBC: 3.28 MIL/uL — ABNORMAL LOW (ref 4.22–5.81)
RDW: 20.2 % — ABNORMAL HIGH (ref 11.5–15.5)
WBC: 8.3 10*3/uL (ref 4.0–10.5)
nRBC: 0 % (ref 0.0–0.2)

## 2020-06-06 LAB — GLUCOSE, CAPILLARY
Glucose-Capillary: 109 mg/dL — ABNORMAL HIGH (ref 70–99)
Glucose-Capillary: 122 mg/dL — ABNORMAL HIGH (ref 70–99)
Glucose-Capillary: 126 mg/dL — ABNORMAL HIGH (ref 70–99)
Glucose-Capillary: 127 mg/dL — ABNORMAL HIGH (ref 70–99)
Glucose-Capillary: 135 mg/dL — ABNORMAL HIGH (ref 70–99)
Glucose-Capillary: 138 mg/dL — ABNORMAL HIGH (ref 70–99)
Glucose-Capillary: 96 mg/dL (ref 70–99)

## 2020-06-06 LAB — CULTURE, BLOOD (ROUTINE X 2)
Culture: NO GROWTH
Culture: NO GROWTH
Special Requests: ADEQUATE
Special Requests: ADEQUATE

## 2020-06-06 LAB — MAGNESIUM: Magnesium: 1.8 mg/dL (ref 1.7–2.4)

## 2020-06-06 LAB — PHOSPHORUS: Phosphorus: 2.2 mg/dL — ABNORMAL LOW (ref 2.5–4.6)

## 2020-06-06 MED ORDER — MAGNESIUM SULFATE IN D5W 1-5 GM/100ML-% IV SOLN
1.0000 g | Freq: Once | INTRAVENOUS | Status: AC
  Administered 2020-06-06: 1 g via INTRAVENOUS
  Filled 2020-06-06: qty 100

## 2020-06-06 MED ORDER — POTASSIUM PHOSPHATES 15 MMOLE/5ML IV SOLN
20.0000 mmol | Freq: Once | INTRAVENOUS | Status: AC
  Administered 2020-06-06: 20 mmol via INTRAVENOUS
  Filled 2020-06-06: qty 6.67

## 2020-06-06 MED ORDER — POTASSIUM CHLORIDE 10 MEQ/100ML IV SOLN
10.0000 meq | INTRAVENOUS | Status: AC
  Administered 2020-06-06 (×4): 10 meq via INTRAVENOUS
  Filled 2020-06-06 (×4): qty 100

## 2020-06-06 MED ORDER — ALBUMIN HUMAN 25 % IV SOLN
25.0000 g | Freq: Four times a day (QID) | INTRAVENOUS | Status: AC
  Administered 2020-06-06 – 2020-06-07 (×4): 25 g via INTRAVENOUS
  Filled 2020-06-06 (×4): qty 100

## 2020-06-06 MED ORDER — HYDROCORTISONE NA SUCCINATE PF 100 MG IJ SOLR
50.0000 mg | Freq: Four times a day (QID) | INTRAMUSCULAR | Status: DC
  Administered 2020-06-06 – 2020-06-08 (×9): 50 mg via INTRAVENOUS
  Filled 2020-06-06 (×9): qty 2

## 2020-06-06 NOTE — Progress Notes (Signed)
NAME:  Jeffrey Harris, MRN:  [id number], DOB:  20-Dec-1955, LOS: 7 ADMISSION DATE:  05/29/2020, CONSULTATION DATE:  06/01/2020 REFERRING MD:  Dr Grandville Silos, CHIEF COMPLAINT:  shock   Brief History   Asked to see patient for shock, unresponsive to fluids Had endoscopy 9/29 showing a large obstructing duodenal mass  Patient with metastatic pancreatic cancer Carcinomatosis abdominis Found to have an obstructing pancreatic mass  Diagnosed with pancreatic cancer-diagnosed November 2020, has had 1 course of chemotherapy-last chemo 05/10/2020, scheduled for next course with Dr. Marin Olp  Past Medical History   Past Medical History:  Diagnosis Date  . Goals of care, counseling/discussion 07/29/2019  . Gout   . Hyperbilirubinemia 07/2019  . Pancreatic cancer metastasized to liver ([place]) 07/29/2019   Significant Hospital Events   Hypotension, altered mental status S/p EGD 06/01/2020->PCCM asked to see when as found altered w/ hypotension 9/30 still on pressors. Still on high FIO2. Added stress dose steroids 10/1 oxygen and pressor requirements better 10/2 continues to improve, not off IV pressors yetPost paracentesis10/2 for 3.4 L 10/3 remains on pressors   Consults:  PCCM GI Surgery  Procedures:  EGD 06/01/2020 CT chest: 1. Dense consolidation and airspace disease throughout the left lung and in the right lung base. Small pleural effusions. Changes likely to represent pneumonia or possibly aspiration.2. Fluid-filled esophagus, possibly indicating reflux or dysmotility. 3. Mass in the head of the pancreas without interval enlargement. 4. Porta hepatis, celiac axis, and retroperitoneal lymph nodes are enlarged, likely metastatic.5. Diffuse free fluid throughout the abdomen and pelvis, likely ascites.6. Bilateral adrenal gland nodules, likely metastatic.7. Pelvic location of the right kidney.8. Enlarged prostate gland. 9. Aortic atherosclerosis.10. No evidence of bowel obstruction or  perforation. CT abd/pelvis: 1. Dense consolidation and airspace disease throughout the left lung and in the right lung base. Small pleural effusions. Changes likely to represent pneumonia or possibly aspiration. 2. Fluid-filled esophagus, possibly indicating reflux or dysmotility. 3. Mass in the head of the pancreas without interval enlargement. 4. Porta hepatis, celiac axis, and retroperitoneal lymph nodes are enlarged, likely metastatic.5. Diffuse free fluid throughout the abdomen and pelvis, likely ascites.6. Bilateral adrenal gland nodules, likely metastatic.7. Pelvic location of the right kidney.8. Enlarged prostate gland. 9. Aortic atherosclerosis.10. No evidence of bowel obstruction or perforation.  Significant Diagnostic Tests:  Chest x-ray with extensive consolidation left lung field Chest x-ray 10/3-some improvement in infiltrates  Micro Data:  None Blood culture 06/01/2020 Sputum culture 06/01/2020 Antimicrobials:  Cefepime 9/29>>> vanc x 1 day (9/28)   Interim history/subjective:  Weaning pressors Better post paracentesis Still requiring oxygen supplementation  Objective   Blood pressure 95/61, pulse 74, temperature 98.1 F (36.7 C), temperature source Oral, resp. rate 20, height 5\' 11"  (1.803 m), weight 87.5 kg, SpO2 97 %.        Intake/Output Summary (Last 24 hours) at 06/06/2020 1004 Last data filed at 06/06/2020 0400 Gross per 24 hour  Intake 2091.66 ml  Output 500 ml  Net 1591.66 ml   Filed Weights   05/31/20 0232 06/01/20 1128 06/05/20 0746  Weight: 78 kg 78 kg 87.5 kg    Examination:  General: This is a 64 year old white male is currently resting in bed and in no acute distress HEENT mucous membranes moist no JVD  Pulmonary: Diminished on the left, no accessory use.  Currently 9 L via high flow salter Cardiac: Regular rate and rhythm Abdomen: Soft nontender Extremities: Warm dry brisk capillary refill Neuro: Awake oriented no focal deficits GU:  Clear yellow.  Resolved Hospital Problem list    Lactic acidosis   Assessment & Plan:  Septic shock in the setting of aspiration pneumonia -suspect also some degree of volume depletion and relative adrenal insufficiency  Plan Resume stress dose steroids (not sure why this fell off protocol) Ck lactate Cont to titrate pressors for SBP > 90 Keep euvolemic    Acute hypoxic respiratory failure in the setting of aspiration pneumonia pcxr personally reviewed. Worsening aeration specifically on left but overall aeration worse when comparing film from prior. Now w/ worsening basilar atx on right.  -I wonder if her aspirated again during attempt at gastric decompression yesterday  Plan NPO Supplemental oxgyen  Pulse ox  Metastatic pancreatic cancer with obstructing duodenal mass -Malignant ascites s/p paracentesis last one on 10/2 Plan For radiation rx today Needs nutrition  Don't see a good outcome here  Fluid and electrolyte imbalance: hyponatremia, hypokalemia Plan Replace K  Repeat chem am   Hyperglycemia Plan ssi   Best practice per primary  And [place] ACNP-BC [place] Pager # [phone] OR # [phone] if no answer

## 2020-06-06 NOTE — Progress Notes (Signed)
eLink Physician-Brief Progress Note Patient Name: Jeffrey Harris DOB: 1956-08-02 MRN: [id number]   Date of Service  06/06/2020  HPI/Events of Note  K and phos levels notified  eICU Interventions  Kphos and IV mag ordered - no additional potassium needed at this time  Would repeat levels today and decide further     Intervention Category Major Interventions: Electrolyte abnormality - evaluation and management  Margaretmary Lombard 06/06/2020, 5:23 AM

## 2020-06-06 NOTE — Progress Notes (Addendum)
5 Days Post-Op  Subjective: Denies fever, chills, nausea, vomiting. Reports abdominal fullness is improved s/p paracentesis Saturday. Does say he wishes his stomach could be drained. reports watery, foul-smelling stools.  NG tube unable to be placed under fluoro 10/1 - couldn't get beyond nasal cavity.  Remains on cefepime for aspiration PNA. On 6 mcg levo with systolic in the 27'C.   ROS: See above, otherwise other systems negative  Objective: Vital signs in last 24 hours: Temp:  [98 F (36.7 C)-98.6 F (37 C)] 98.1 F (36.7 C) (10/04 0400) Pulse Rate:  [62-90] 74 (10/04 0600) Resp:  [18-31] 20 (10/04 0600) SpO2:  [86 %-96 %] 92 % (10/04 0600) Last BM Date: 06/06/20  Intake/Output from previous day: 10/03 0701 - 10/04 0700 In: 2091.7 [I.V.:1343.3; IV Piggyback:748.4] Out: 500 [Urine:500] Intake/Output this shift: No intake/output data recorded.  PE: Heart: regular Lungs: non-labored respirations on [place], diminished breath sounds BL bases. Abd: soft, upper abdominal fullness, high pitched tinkering bowel sounds, NT Pscyh: A&Ox3, flat affect   Lab Results:  Recent Labs    06/05/20 0423 06/06/20 0245  WBC 10.6* 8.3  HGB 11.2* 11.3*  HCT 32.4* 31.9*  PLT 116* 115*   BMET Recent Labs    06/05/20 0423 06/06/20 0245  NA 138 134*  K 3.2* 3.3*  CL 106 102  CO2 21* 23  GLUCOSE 105* 101*  BUN 15 12  CREATININE 0.47* 0.45*  CALCIUM 7.7* 7.5*   PT/INR Recent Labs    06/04/20 0802  LABPROT 23.3*  INR 2.2*   CMP     Component Value Date/Time   NA 134 (L) 06/06/2020 0245   K 3.3 (L) 06/06/2020 0245   CL 102 06/06/2020 0245   CO2 23 06/06/2020 0245   GLUCOSE 101 (H) 06/06/2020 0245   BUN 12 06/06/2020 0245   CREATININE 0.45 (L) 06/06/2020 0245   CREATININE 0.58 (L) 05/17/2020 1020   CALCIUM 7.5 (L) 06/06/2020 0245   PROT 5.2 (L) 06/06/2020 0245   ALBUMIN 2.3 (L) 06/06/2020 0245   AST 35 06/06/2020 0245   AST 48 (H) 05/17/2020 1020   ALT 22  06/06/2020 0245   ALT 27 05/17/2020 1020   ALKPHOS 215 (H) 06/06/2020 0245   BILITOT 1.6 (H) 06/06/2020 0245   BILITOT 1.2 05/17/2020 1020   GFRNONAA >60 06/06/2020 0245   GFRNONAA >60 05/17/2020 1020   GFRAA >60 06/06/2020 0245   GFRAA >60 05/17/2020 1020   Lipase     Component Value Date/Time   LIPASE 29 05/29/2020 2247       Studies/Results: US Paracentesis  Result Date: 06/04/2020 INDICATION: Patient with history of metastatic pancreatic cancer, recurrent ascites. Request to IR for therapeutic paracentesis. EXAM: ULTRASOUND GUIDED THERAPEUTIC PARACENTESIS MEDICATIONS: 10 mL 1% lidocaine COMPLICATIONS: None immediate. PROCEDURE: Informed written consent was obtained from the patient after a discussion of the risks, benefits and alternatives to treatment. A timeout was performed prior to the initiation of the procedure. Initial ultrasound scanning demonstrates a large amount of ascites within the left lower abdominal quadrant. The left lower abdomen was prepped and draped in the usual sterile fashion. 1% lidocaine was used for local anesthesia. Following this, a 19 gauge, 7-cm, Yueh catheter was introduced. An ultrasound image was saved for documentation purposes. The paracentesis was performed. The catheter was removed and a dressing was applied. The patient tolerated the procedure well without immediate post procedural complication. FINDINGS: A total of approximately 3.4 L of clear yellow fluid was removed.  IMPRESSION: Successful ultrasound-guided paracentesis yielding 3.4 liters of peritoneal fluid. Read by Candiss Norse, PA-C Electronically Signed   By: Corrie Mckusick D.O.   On: 06/04/2020 13:00   DG Chest Port 1 View  Result Date: 06/05/2020 CLINICAL DATA:  Respiratory failure. EXAM: PORTABLE CHEST 1 VIEW COMPARISON:  Chest radiograph 06/01/2020 and CT 06/02/2020 FINDINGS: A right jugular Port-A-Cath terminates over the lower SVC. The cardiomediastinal silhouette is unchanged with  normal heart size. Widespread airspace opacity throughout the left lung has mildly improved from the prior radiograph. Right basilar airspace opacity is new from the prior radiograph although dense consolidation was present in this region on the interval CT. There may be persistent small bilateral pleural effusions. No pneumothorax is identified. IMPRESSION: Mild improvement of extensive left lung airspace disease with persistent milder disease in the right lung base which may reflect pneumonia. Electronically Signed   By: Logan Bores M.D.   On: 06/05/2020 05:57    Anti-infectives: Anti-infectives (From admission, onward)   Start     Dose/Rate Route Frequency Ordered Stop   06/02/20 0500  vancomycin (VANCOREADY) IVPB 1500 mg/300 mL  Status:  Discontinued        1,500 mg 150 mL/hr over 120 Minutes Intravenous STAT 06/01/20 1649 06/01/20 2051   06/02/20 0500  vancomycin (VANCOREADY) IVPB 1500 mg/300 mL  Status:  Discontinued        1,500 mg 150 mL/hr over 120 Minutes Intravenous Every 12 hours 06/01/20 2051 06/02/20 1333   06/01/20 1700  vancomycin (VANCOREADY) IVPB 1750 mg/350 mL        1,750 mg 175 mL/hr over 120 Minutes Intravenous STAT 06/01/20 1647 06/02/20 1745   06/01/20 1600  ceFEPIme (MAXIPIME) 2 g in sodium chloride 0.9 % 100 mL IVPB        2 g 200 mL/hr over 30 Minutes Intravenous Every 8 hours 06/01/20 1458 06/08/20 2359   05/30/20 1930  metroNIDAZOLE (FLAGYL) IVPB 500 mg  Status:  Discontinued        500 mg 100 mL/hr over 60 Minutes Intravenous Every 8 hours 05/30/20 1859 05/31/20 1103       Assessment/Plan Aspiration PNA with sepsis - improving, chest CT 9/30 showed significant disease in left lung.  Weaning pressors.  Acute hypoxic respiratory failure Prolonged QT interval Failure to thrive     Duodenal obstruction secondary to metastatic pancreatic cancer -  As of right now patient is indicating he wants to be aggressive with his care despite his poor prognosis.   -  plan to start radiation therapy today per Dr. Marin Olp, no plans for chemo at this time -  Before he could undergo surgical J-tube placement, he will need to continue to improve from a pulmonary standpoint before pulmonary stress from GETA. He remains on pressor support after aspiration PNA. Will discuss this with my attending for the week, Dr. Marlou Starks.  - Failed attempt at NG decompression this weekend under fluoro. Repeat KUB this AM. Will discuss possible percutaneous gastrostomy tube with IR for decompression.    FEN - npo,  VTE - scds ID - Maxipime 9/29 >> aspiration PNA    LOS: 7 days    Jill Alexanders , [facility] 06/06/2020, 8:08 AM Please see Amion for pager number during day hours 7:00am-4:30pm or 7:00am -11:30am on weekends

## 2020-06-06 NOTE — Progress Notes (Signed)
Overall, Jeffrey Harris really is about the same.  His pressors are going up a little bit in dose.  His blood pressure this morning has systolic in the 76E and 83T.  I can just tell that he is getting weaker.  Again his prealbumin is less than 5.  He is to start radiation therapy today to try to help with the obstruction in the duodenum.  I am not going to use chemotherapy as I does do not know if he can handle combination treatments.  He did have some ascites removed.  It seems like there might be a buildup again.  I think he is probably going to need a peritoneal drainage catheter.  He is not hurting.  He just has fullness in the upper abdomen.  His white cell count is 8.3.  Hemoglobin 11.3.  Platelet count 115,000.  His BUN is 12 creatinine 0.45.  Albumin is 2.3.  His calcium is 7.5.  His vital signs show symmetry of a 98.1.  Pulse 82.  His blood pressure was 64/48.  He was alert.  He just seems weaker overall.  His lungs sound pretty clear bilaterally.  Cardiac exam regular rate and rhythm.  Abdomen is soft.  There may be a little bit of distention.  There is no guarding or rebound tenderness.  Again, [name] has a duodenal obstruction secondary to his pancreatic cancer.  He will start radiation today.  I am not sure if he will have a J-tube put in for feeding.  I am not sure when this will happen.  I know this is very complicated.  I will have to talk to his wife today.  I am just not sure how much we really need to "push" with [name] in order to improve his quality of life.  Lattie Haw, MD  2 Timothy 4:6-8

## 2020-06-06 NOTE — Progress Notes (Signed)
PROGRESS NOTE    Jeffrey Harris  SEG:[id number] DOB: 06-08-1956 DOA: 05/29/2020 PCP: [name], MD    Chief Complaint  Patient presents with  . 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 Marin Olp, history of gout presented to [facility], ED with generalized weakness, intractable nausea and vomiting.  Patient reported that for the past 4 weeks he has been bouts of nausea and vomiting.  Described as nonbilious, nonbloody vomiting typically after attempting to eat solids.  Patient ported experiencing numerous episodes daily.  Also reported generalized weakness, lightheadedness upon standing up.  Denied any fevers, diarrhea, sick contacts, recent travel.  Recently diagnosed with malignant ascites, undergone paracentesis 3 times since diagnosed on September 3.  Patient last received Gemzar and Abraxane on 9/7 Patient was found to be profoundly volume depleted in ED with moderate hyponatremia, severe hypokalemia, hypochloremia and alkalosis.  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  Postprocedure patient seemed to be doing fine however patient was transferred to the floor and noted to be tachycardic initially and subsequently hypotensive, significant pallor, cough.  Concern for septic shock versus acute bleed versus possible perforation as patient underwent recent procedure with a large mass noted.  Patient transferred to the ICU, currently on Levophed drip and dose needed to be increased overnight.  Neo-Synephrine drip was weaned off 06/02/2020.  Patient also noted on IV Solu-Cortef which has subsequently been discontinued.  Hypotension improved however blood pressure soft and fluctuating.  KVO IV fluids due to concerns for possible volume overload from aggressive fluid resuscitation.  Repeat chest x-ray with mild improvement of extensive left lung airspace disease with persistent moderate disease in the right lung base which may reflect pneumonia. Patient noted to have a elevated lactic acid level of 5.6 which has trended down to 1.6, elevated procalcitonin which is trending down.  Blood cultures pending.  Urine strep pneumococcus antigen negative. Urine Legionella antigen negative.  Urinalysis nitrite negative, leukocytes negative.  CT abdomen and pelvis negative for any perforation.  CT chest which was done with dense consolidation and airspace disease throughout the left lung and in the right lung base, small pleural effusions, fluid-filled esophagus possibly indicating reflux or dysmotility, mass in the head of the pancreas without interval enlargement, porta hepatis, celiac axis and retroperitoneal lymph nodes enlarged likely metastatic, diffuse free fluid throughout the abdomen and pelvis likely ascites, bilateral adrenal gland nodules likely metastatic, enlarged prostate gland, no evidence of bowel obstruction or perforation.  Continue empiric IV cefepime to complete a 7-day course of treatment.  IV vancomycin discontinued.  Continue Mucinex,  Xopenex, Atrovent nebs.  PCCM following and I appreciate input and recommendations.  2 acute respiratory failure with hypoxia secondary to aspiration pneumonia Patient noted to be severe sepsis with septic shock with shortness of breath post endoscopy 06/01/2020.  Chest x-ray CT chest done with a left-sided pneumonia and right basilar pneumonia.  Patient with some initial improvement with respiratory status however noted to have increased O2 requirements overnight.  Currently on 10 L high flow with nonrebreather, from 6 L nasal cannula with nonrebreather from 4 L nasal cannula from 8 L high flow nasal cannula from 15 L high flow.  Urine strep pneumococcus antigen negative.  Urine Legionella antigen negative.[name]  Sputum Gram stain and cultures with rare gram-negative rods, moderate gram-positive cocci, rare yeast.  Lasix 20 mg IV x1 ordered per PCCM on 06/05/2020.  IV fluids have been KVO.  Patient with crackles noted on examination.  Repeat chest x-ray.  Continue IV cefepime, Xopenex, Atrovent nebs.  IV vancomycin has been discontinued.  PCCM following.  3 metastatic pancreatic cancer to the liver with recent diagnosis of malignant ascites status post paracentesis/obstructing mass Patient being followed by oncology, Dr. Marin Olp last received chemotherapy with Gemzar and Abraxane 05/10/2020.  Patient seen by gastroenterology due to concerns for nausea and vomiting, abdominal x-ray done showed no high-grade obstructive bowel gas pattern, no free air, moderate colonic stool burden with CT abdomen and pelvis done showing persistent enlargement of pancreatic head with distal CBD stent in place.  Upper GI series showed moderate to marked partial duodenal obstruction at the level of the pancreatic tumor and stent.  Patient underwent upper endoscopy 06/01/2020, which revealed a large obstructing duodenal mass.  Gastroenterology recommended general surgery consultation for placement of feeding jejunostomy tube.  General  surgery consulted and are following and ordered plain films of the abdomen to assess how large his stomach is to determine whether NG tube temporary for decompression may be helpful.  NG tube attempted 7 times per patient with no success.  Patient seen by radiation oncology and patient underwent simulation 06/03/2020 and patient to begin palliative radiation today, 06/06/2020 to see whether tumor burden causing obstruction can be shrunk in conjunction with chemotherapy.  Per radiation/oncology and oncology.  Palliative care following.   4.  Severe electrolyte  abnormalities/hypokalemia/hyponatremia/hypochloremia secondary to GI losses with intractable nausea and vomiting On admission patient noted to have a potassium of 2.4, sodium of 127, chloride less than 65, CO2 of 42.  Patient placed on aggressive IV fluid hydration, potassium supplementation.  Serum osmolality at 264, urine osmolality of 682, urine sodium of 144 concern for possible SIADH component due to malignancy.  Abdominal films done with no high-grade obstructive bowel gas pattern, no free air, moderate colonic stool burden.  CT abdomen showed persistent enlargement of pancreatic head with a distal CBD stent in place.  GI was consulted upper GI series showed marked partial duodenal obstruction at the level of pancreatic tumor stent.  Patient underwent upper endoscopy this morning that showed a large obstructing duodenal mass.  Patient currently n.p.o.  General surgery consulted to assess for placement of feeding jejunostomy tube and likely will await until respiratory status has improved.  General surgery following..  Replete electrolytes.  Follow.  5.  Severe dehydration Some concern for volume overload.  IV fluids KVO.  Patient received a dose of Lasix IV x1 yesterday.  Patient noted to have increased O2 requirements sounds volume overloaded on examination.  Repeat chest x-ray.  On Levophed.  Gentle hydration.  Follow.  6.  Malignant  ascites Last paracentesis 05/23/2020.  Patient with complaints of abdominal fullness and subsequently underwent ultrasound-guided paracentesis with 3.4 L removed on 06/04/2020.  Patient did receive a dose of IV albumin prior to paracentesis.  Patient states supposed to get a drain placed today for drainage of ascitic fluid.  Per oncology.  7.  Prolonged QT interval EKG showed a prolonged QTC of 527 on admission.  Patient noted to have severe electrolyte abnormalities.  Repeat EKG with resolution of QT prolongation.  Keep potassium >4, magnesium >2.  Avoid QT prolongation medications.  Follow.  8.  Constipation Patient stated had bowel movement.  Passing gas.  Patient noted to be refusing Dulcolax suppositories.  Dulcolax suppositories have been changed to as needed.  Follow.   9.  Hyponatremia/hypokalemia/hypophosphatemia Secondary to hypovolemic hyponatremia.  Improved with hydration.  Sodium level at 134.  Potassium currently at 3.3.  Phosphorus at 2.2.  Magnesium at 1.8.  Replete electrolytes.  Repeat labs in the morning.   10.  Failure to thrive Secondary to metastatic pancreatic cancer.  Palliative care following.  Patient currently n.p.o. secondary to large obstructing pancreatic mass.  General surgery consulted for placement of jejunostomy tube.  Tube placement on hold due to current respiratory status.  Prealbumin < 5.  Status post IV albumin prior to paracentesis on 06/04/2020.  Patient noted to be hypotensive this morning.  Will order IV albumin every 6 hours x1 day.  Follow.    DVT prophylaxis: SCDs Code Status: Full Family Communication: Updated patient.  No family at bedside.  Disposition:   Status is: Inpatient    Dispo: The patient is from: Home              Anticipated d/c is to: To be determined              Anticipated d/c date is: To be determined.              Patient with septic shock secondary to aspiration pneumonia, on pressors, on IV antibiotics, metastatic  pancreatic cancer with obstruction.  Increased O2 requirements.  Not stable for discharge.       Consultants:   General surgery: Dr. Hassell Done 06/01/2020  Palliative care: Dr. Rowe Pavy Jun 01, 2020  Gastroenterology: Dr. Collene Mares May 30, 2020  Oncology: Dr. Marin Olp May 30, 2020  PCCM: Dr Ander Slade 06/01/2020  Procedures:  Upper endoscopy Jun 01, 2020  CT abdomen and pelvis May 30, 2020  Chest x-ray May 30, 2020, May 31, 2020, Jun 01, 2020  CT abdomen and pelvis 06/01/2020  CT chest 06/01/2020  Abdominal films 06/03/2020  Ultrasound-guided paracentesis 06/04/2020--- 3.4 L of clear yellow fluid removed, Luther Parody, PA, IR  Antimicrobials:   IV cefepime Jun 01, 2020>>>> 06/08/2020  IV vancomycin Jun 01, 2020>>>>> 06/02/2020   Subjective: Patient laying in bed.  Complains of some abdominal discomfort.  Noted to have increased O2 requirements currently on 10 L high flow nasal cannula.  Patient also noted to have increased dose of Levophed as noted to be hypotensive this morning.  Patient denies any chest pain.  Denies any significant worsening shortness of breath.  Stating he supposed to get radiation today.  And possibly drain placed in his abdomen for drainage of ascitic fluid.  Objective: Vitals:   06/06/20 0530 06/06/20 0545 06/06/20 0600 06/06/20 0840  BP:      Pulse: 73 65 74   Resp: 20 20 20    Temp:      TempSrc:  SpO2: 95% 93% 92% 97%  Weight:      Height:        Intake/Output Summary (Last 24 hours) at 06/06/2020 0914 Last data filed at 06/06/2020 0400 Gross per 24 hour  Intake 2091.66 ml  Output 500 ml  Net 1591.66 ml   Filed Weights   05/31/20 0232 06/01/20 1128 06/05/20 0746  Weight: 78 kg 78 kg 87.5 kg    Examination:  General exam: On 10 L high flow nasal cannula with nonrebreather. Respiratory system: Diffuse bibasilar crackles.  No wheezing.  Fair air movement.  Speaking in full sentences.  On 10 L high flow nasal cannula with nonrebreather.   Cardiovascular system: Regular rate and rhythm no murmurs rubs or gallops.  No JVD.  Pedal edema.  Gastrointestinal system: Abdomen is soft, nontender, nondistended, positive bowel sounds.  No rebound.  No guarding.  Central nervous system: Alert and oriented. No focal neurological deficits. Extremities: Symmetric 5 x 5 power. Skin: No rashes, lesions or ulcers Psychiatry: Judgement and insight appear normal. Mood & affect somewhat depressed and flattened.     Data Reviewed: I have personally reviewed following labs and imaging studies  CBC: Recent Labs  Lab 06/02/20 0453 06/03/20 0335 06/04/20 0400 06/05/20 0423 06/06/20 0245  WBC 25.9* 19.7* 11.2* 10.6* 8.3  NEUTROABS 24.0* 18.1* 10.0* 8.8* 6.2  HGB 12.0* 10.6* 10.1* 11.2* 11.3*  HCT 34.4* 30.4* 29.2* 32.4* 31.9*  MCV 98.0 97.1 98.0 97.9 97.3  PLT 101* 90* 79* 116* 115*    Basic Metabolic Panel: Recent Labs  Lab 06/01/20 0351 06/01/20 0351 06/01/20 1652 06/01/20 1652 06/02/20 0453 06/03/20 0335 06/04/20 0400 06/05/20 0423 06/06/20 0245  NA 127*   < > 122*   < > 128* 133* 133* 138 134*  K 3.0*   < > 3.9   < > 3.9 3.3* 3.7 3.2* 3.3*  CL 82*   < > 81*   < > 91* 98 102 106 102  CO2 34*   < > 25   < > 20* 21* 22 21* 23  GLUCOSE 98   < > 98   < > 184* 187* 129* 105* 101*  BUN 8   < > 10   < > 15 13 14 15 12   CREATININE 0.61   < > 0.95   < > 1.00 0.58* 0.48* 0.47* 0.45*  CALCIUM 7.6*   < > 7.3*   < > 7.0* 7.1* 7.3* 7.7* 7.5*  MG 2.3  --  2.0  --  2.2  --   --  1.8 1.8  PHOS  --   --   --   --   --   --   --  1.2* 2.2*   < > = values in this interval not displayed.    GFR: Estimated Creatinine Clearance: 103.3 mL/min (A) (by C-G formula based on SCr of 0.45 mg/dL (L)).  Liver Function Tests: Recent Labs  Lab 06/02/20 0453 06/03/20 0335 06/04/20 0400 06/05/20 0423 06/06/20 0245  AST 33 35 32 39 35  ALT 18 23 21 23 22   ALKPHOS 125 113 140* 212* 215*  BILITOT 2.0* 1.9* 1.5* 1.9* 1.6*  PROT 5.1* 5.1* 4.9*  5.1* 5.2*  ALBUMIN 2.6* 2.4* 2.2* 2.4* 2.3*    CBG: Recent Labs  Lab 06/05/20 1600 06/05/20 1946 06/06/20 0015 06/06/20 0450 06/06/20 0908  GLUCAP 140* 131* 96 109* 135*     Recent Results (from the past 240 hour(s))  Respiratory Panel by RT PCR (  Flu A&B, Covid) - Nasopharyngeal Swab     Status: None   Collection Time: 05/30/20  1:40 AM   Specimen: Nasopharyngeal Swab  Result Value Ref Range Status   SARS Coronavirus 2 by RT PCR NEGATIVE NEGATIVE Final    Comment: (NOTE) SARS-CoV-2 target nucleic acids are NOT DETECTED.  The SARS-CoV-2 RNA is generally detectable in upper respiratoy specimens during the acute phase of infection. The lowest concentration of SARS-CoV-2 viral copies this assay can detect is 131 copies/mL. A negative result does not preclude SARS-Cov-2 infection and should not be used as the sole basis for treatment or other patient management decisions. A negative result may occur with  improper specimen collection/handling, submission of specimen other than nasopharyngeal swab, presence of viral mutation(s) within the areas targeted by this assay, and inadequate number of viral copies (<131 copies/mL). A negative result must be combined with clinical observations, patient history, and epidemiological information. The expected result is Negative.  Fact Sheet for Patients:  PinkCheek.be  Fact Sheet for Healthcare Providers:  GravelBags.it  This test is no t yet approved or cleared by the [place] FDA and  has been authorized for detection and/or diagnosis of SARS-CoV-2 by FDA under an Emergency Use Authorization (EUA). This EUA will remain  in effect (meaning this test can be used) for the duration of the COVID-19 declaration under Section 564(b)(1) of the Act, 21 U.S.C. section 360bbb-3(b)(1), unless the authorization is terminated or revoked sooner.     Influenza A by PCR NEGATIVE  NEGATIVE Final   Influenza B by PCR NEGATIVE NEGATIVE Final    Comment: (NOTE) The Xpert Xpress SARS-CoV-2/FLU/RSV assay is intended as an aid in  the diagnosis of influenza from Nasopharyngeal swab specimens and  should not be used as a sole basis for treatment. Nasal washings and  aspirates are unacceptable for Xpert Xpress SARS-CoV-2/FLU/RSV  testing.  Fact Sheet for Patients: PinkCheek.be  Fact Sheet for Healthcare Providers: GravelBags.it  This test is not yet approved or cleared by the [place] FDA and  has been authorized for detection and/or diagnosis of SARS-CoV-2 by  FDA under an Emergency Use Authorization (EUA). This EUA will remain  in effect (meaning this test can be used) for the duration of the  Covid-19 declaration under Section 564(b)(1) of the Act, 21  U.S.C. section 360bbb-3(b)(1), unless the authorization is  terminated or revoked. Performed at [facility], [place] [address] [place], [place] [address]   Culture, sputum-assessment     Status: None   Collection Time: 06/01/20  3:34 PM   Specimen: Sputum  Result Value Ref Range Status   Specimen Description SPU  Final   Special Requests NONE  Final   Sputum evaluation   Final    THIS SPECIMEN IS ACCEPTABLE FOR SPUTUM CULTURE Performed at [facility], [place] [address] [place], [place] [address]    Report Status 06/01/2020 FINAL  Final  Culture, respiratory     Status: None   Collection Time: 06/01/20  3:34 PM   Specimen: Sputum  Result Value Ref Range Status   Specimen Description   Final    SPU Performed at [place] [address] [place], [place] [address]    Special Requests   Final    NONE Reflexed from [id number] Performed at [facility], [place] [address], [place], [place] [address]    Gram Stain   Final    FEW WBC PRESENT,BOTH PMN AND MONONUCLEAR RARE  GRAM  NEGATIVE RODS MODERATE GRAM POSITIVE COCCI RARE YEAST    Culture   Final    FEW Normal respiratory flora-no Staph aureus or Pseudomonas seen Performed at [facility], [address], [place], [place] [address]    Report Status 06/03/2020 FINAL  Final  Culture, blood (Routine X 2) w Reflex to ID Panel     Status: None (Preliminary result)   Collection Time: 06/01/20  4:38 PM   Specimen: BLOOD  Result Value Ref Range Status   Specimen Description   Final    BLOOD LEFT ANTECUBITAL Performed at [place] [address] [place], [place] [address]    Special Requests   Final    BOTTLES DRAWN AEROBIC ONLY Blood Culture adequate volume Performed at [place] [address] [place], [place] [address]    Culture   Final    NO GROWTH 4 DAYS Performed at [facility], [place] [address], [place], [place] [address]    Report Status PENDING  Incomplete  Culture, blood (Routine X 2) w Reflex to ID Panel     Status: None (Preliminary result)   Collection Time: 06/01/20  4:38 PM   Specimen: BLOOD LEFT HAND  Result Value Ref Range Status   Specimen Description   Final    BLOOD LEFT HAND Performed at [place] [address] [place], [place] [address]    Special Requests   Final    BOTTLES DRAWN AEROBIC ONLY Blood Culture adequate volume Performed at [place] [address] [place], [place] [address]    Culture   Final    NO GROWTH 4 DAYS Performed at [facility], [place] [address], [place], [place] [address]    Report Status PENDING  Incomplete  MRSA PCR Screening     Status: None   Collection Time: 06/01/20  5:24 PM   Specimen: Nasal Mucosa; Nasopharyngeal  Result Value Ref Range Status   MRSA by PCR NEGATIVE NEGATIVE Final    Comment:        The GeneXpert MRSA Assay (FDA approved for NASAL specimens only), is one component of a comprehensive MRSA colonization surveillance  program. It is not intended to diagnose MRSA infection nor to guide or monitor treatment for MRSA infections. Performed at [facility], [place] [address] [place], [place] [address]   Urine culture     Status: None   Collection Time: 06/02/20 12:23 AM   Specimen: Urine, Catheterized  Result Value Ref Range Status   Specimen Description   Final    URINE, CATHETERIZED Performed at [place] [address] [place], [place] [address]    Special Requests   Final    NONE Performed at [facility], [place] [address] [place], [place] [address]    Culture   Final    NO GROWTH Performed at [facility], [place] [address] [place], [place] [address]    Report Status 06/03/2020 FINAL  Final         Radiology Studies: US Paracentesis  Result Date: 06/04/2020 INDICATION: Patient with history of metastatic pancreatic cancer, recurrent ascites. Request to IR for therapeutic paracentesis. EXAM: ULTRASOUND GUIDED THERAPEUTIC PARACENTESIS MEDICATIONS: 10 mL 1% lidocaine COMPLICATIONS: None immediate. PROCEDURE: Informed written consent was obtained from the patient after a discussion of the risks, benefits and alternatives to treatment. A timeout was performed prior to the initiation of the  procedure. Initial ultrasound scanning demonstrates a large amount of ascites within the left lower abdominal quadrant. The left lower abdomen was prepped and draped in the usual sterile fashion. 1% lidocaine was used for local anesthesia. Following this, a 19 gauge, 7-cm, Yueh catheter was introduced. An ultrasound image was saved for documentation purposes. The paracentesis was performed. The catheter was removed and a dressing was applied. The patient tolerated the procedure well without immediate post procedural complication. FINDINGS: A total of approximately 3.4 L of clear yellow fluid was removed. IMPRESSION: Successful ultrasound-guided  paracentesis yielding 3.4 liters of peritoneal fluid. Read by Candiss Norse, PA-C Electronically Signed   By: Corrie Mckusick D.O.   On: 06/04/2020 13:00   DG Chest Port 1 View  Result Date: 06/05/2020 CLINICAL DATA:  Respiratory failure. EXAM: PORTABLE CHEST 1 VIEW COMPARISON:  Chest radiograph 06/01/2020 and CT 06/02/2020 FINDINGS: A right jugular Port-A-Cath terminates over the lower SVC. The cardiomediastinal silhouette is unchanged with normal heart size. Widespread airspace opacity throughout the left lung has mildly improved from the prior radiograph. Right basilar airspace opacity is new from the prior radiograph although dense consolidation was present in this region on the interval CT. There may be persistent small bilateral pleural effusions. No pneumothorax is identified. IMPRESSION: Mild improvement of extensive left lung airspace disease with persistent milder disease in the right lung base which may reflect pneumonia. Electronically Signed   By: Logan Bores M.D.   On: 06/05/2020 05:57   DG Abd Portable 1V  Result Date: 06/06/2020 CLINICAL DATA:  65 year old male with pancreatic cancer, gastric distension. EXAM: PORTABLE ABDOMEN - 1 VIEW COMPARISON:  Abdominal film 06/04/2019. CT Abdomen and Pelvis 06/02/2020. FINDINGS: Portable AP supine view at 0826 hours. There is moderate to severe gastric distention with air. There is abrupt termination of air at the level of the distal stomach or duodenal bulb. A metal biliary stent is redemonstrated projecting over the mid abdomen and distended stomach. There is a paucity of bowel gas elsewhere throughout the abdomen and pelvis. There is a small volume of gas in the normal rectosigmoid colon. No acute osseous abnormality identified. IMPRESSION: 1. 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 2. Decompressed small and large bowel. Electronically Signed   By: Genevie Ann M.D.   On: 06/06/2020 08:44         Scheduled Meds: . Chlorhexidine Gluconate Cloth  6 each Topical Daily  . enoxaparin (LOVENOX) injection  40 mg Subcutaneous Q24H  . insulin aspart  0-9 Units Subcutaneous Q4H  . ipratropium  0.5 mg Nebulization TID  . levalbuterol  0.63 mg Nebulization TID  . mouth rinse  15 mL Mouth Rinse BID  . pantoprazole (PROTONIX) IV  40 mg Intravenous Q12H  . phytonadione  10 mg Subcutaneous Daily  . sodium chloride flush  10-40 mL Intracatheter Q12H   Continuous Infusions: . sodium chloride 10 mL/hr at 06/06/20 0400  . sodium chloride 250 mL (06/06/20 0840)  . albumin human 25 g (06/06/20 0846)  . ceFEPime (MAXIPIME) IV Stopped (06/06/20 0101)  . chlorproMAZINE (THORAZINE) IV Stopped (06/02/20 0309)  . norepinephrine (LEVOPHED) Adult infusion 6 mcg/min (06/06/20 0400)  . potassium chloride 10 mEq (06/06/20 0843)     LOS: 7 days    Time spent: 40 min    Irine Seal, MD [facility]   To contact the attending provider between 7A-7P or the covering provider during after hours 7P-7A, please log into the web  site [email or web address] and access using universal [place] password for that web site. If you do not have the password, please call the hospital operator.  06/06/2020, 9:14 AM

## 2020-06-06 NOTE — Progress Notes (Signed)
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 Marin Olp), gout who presents to [facility] emergency department with generalized weakness nausea and vomiting.    PT Comments    Patient's medical status has changed since last PT visit 6 days ago. Patient now on 10 L HFNC and tolerates basic transfers to chair with min assistance. Continue PT for mobility.   Follow Up Recommendations  Home health PT     Equipment Recommendations  None recommended by PT    Recommendations for Other Services       Precautions / Restrictions Precautions Precautions: Fall Precaution Comments: monitor sats    Mobility  Bed Mobility   Bed Mobility: Supine to Sit     Supine to sit: Min assist     General bed mobility comments: assist with trunk  Transfers Overall transfer level: Needs assistance   Transfers: Sit to/from Stand;Stand Pivot Transfers Sit to Stand: Min assist Stand pivot transfers: Min assist       General transfer comment: slow to rise  cues for hand placement. Transferred to [facility], then stood for recliner to be pulled up  Ambulation/Gait                 Stairs             Wheelchair Mobility    Modified Rankin (Stroke Patients Only)       Balance Overall balance assessment: Needs assistance Sitting-balance support: Feet supported;No upper extremity supported Sitting balance-Leahy Scale: Good     Standing balance support: During functional activity;No upper extremity supported Standing balance-Leahy Scale: Fair Standing balance comment: min steady assist  while pulling up briefs                            Cognition Arousal/Alertness: Awake/alert Behavior During Therapy:  WFL for tasks assessed/performed                                          Exercises      General Comments        Pertinent Vitals/Pain Pain Assessment: Faces Faces Pain Scale: Hurts even more Pain Location: abdomen, back Pain Descriptors / Indicators: Aching;Grimacing;Discomfort Pain Intervention(s): Limited activity within patient's tolerance;Monitored during session    Home Living                      Prior Function            PT Goals (current goals can now be found in the care plan section) Progress towards PT goals: Progressing toward goals    Frequency    Min 3X/week      PT Plan Current plan remains appropriate;Discharge plan needs to be updated    Co-evaluation              AM-PAC PT "6 Clicks" Mobility   Outcome Measure  Help needed turning from your back to your side while in a flat bed without using bedrails?: A Little Help needed moving from lying on your back to sitting on the side of a flat bed without using bedrails?: A Little Help needed  moving to and from a bed to a chair (including a wheelchair)?: A Little Help needed standing up from a chair using your arms (e.g., wheelchair or bedside chair)?: A Little Help needed to walk in hospital room?: A Lot Help needed climbing 3-5 steps with a railing? : A Lot 6 Click Score: 16    End of Session   Activity Tolerance: Patient limited by fatigue Patient left: in chair;with call bell/phone within reach;with nursing/sitter in room;with family/visitor present Nurse Communication: Mobility status PT Visit Diagnosis: Other abnormalities of gait and mobility (R26.89)     Time: 4314-2767 PT Time Calculation (min) (ACUTE ONLY): 25 min  Charges:  $Therapeutic Activity: 23-37 mins                     [place] Pager [phone] Office [phone]    Claretha Cooper 06/06/2020, 5:29 PM

## 2020-06-06 NOTE — TOC Progression Note (Signed)
Transition of Care [facility]) - Progression Note    Patient Details  Name: Jeffrey Harris MRN: [id number] Date of Birth: 1956/07/01  Transition of Care [facility] CM/SW Contact  Leeroy Cha, RN Phone Number: 06/06/2020, 10:33 AM  Clinical Narrative:    5 Days Post-Op  Subjective: Denies fever, chills, nausea, vomiting. Reports abdominal fullness is improved s/p paracentesis Saturday. Does say he wishes his stomach could be drained. reports watery, foul-smelling stools.  NG tube unable to be placed under fluoro 10/1 - couldn't get beyond nasal cavity.  Remains on cefepime for aspiration PNA. On 6 mcg levo with systolic in the 58'E.  hfnc at 10l/min, iv solu cortwf, iv maxipime, iv levophed Following for progression and poss toc needs.  Expected Discharge Plan: [place] Barriers to Discharge: Barriers Unresolved (comment) (continued shock and treatment)  Expected Discharge Plan and Services Expected Discharge Plan: [place]   Discharge Planning Services: CM Consult   Living arrangements for the past 2 months: Single Family Home                                       Social Determinants of Health (SDOH) Interventions    Readmission Risk Interventions No flowsheet data found.

## 2020-06-07 ENCOUNTER — Inpatient Hospital Stay: Payer: Self-pay

## 2020-06-07 ENCOUNTER — Ambulatory Visit
Admit: 2020-06-07 | Discharge: 2020-06-07 | Disposition: A | Discharge status: Home / Self Care | Payer: BC Managed Care – PPO | Attending: Radiation Oncology | Admitting: Radiation Oncology

## 2020-06-07 DIAGNOSIS — J690 Pneumonitis due to inhalation of food and vomit: Secondary | ICD-10-CM | POA: Diagnosis not present

## 2020-06-07 DIAGNOSIS — A419 Sepsis, unspecified organism: Secondary | ICD-10-CM | POA: Diagnosis not present

## 2020-06-07 DIAGNOSIS — J9601 Acute respiratory failure with hypoxia: Secondary | ICD-10-CM | POA: Diagnosis not present

## 2020-06-07 DIAGNOSIS — E860 Dehydration: Secondary | ICD-10-CM | POA: Diagnosis not present

## 2020-06-07 DIAGNOSIS — R6520 Severe sepsis without septic shock: Secondary | ICD-10-CM | POA: Diagnosis not present

## 2020-06-07 LAB — CBC WITH DIFFERENTIAL/PLATELET
Abs Immature Granulocytes: 0.02 10*3/uL (ref 0.00–0.07)
Basophils Absolute: 0 10*3/uL (ref 0.0–0.1)
Basophils Relative: 0 %
Eosinophils Absolute: 0 10*3/uL (ref 0.0–0.5)
Eosinophils Relative: 0 %
HCT: 27.7 % — ABNORMAL LOW (ref 39.0–52.0)
Hemoglobin: 9.5 g/dL — ABNORMAL LOW (ref 13.0–17.0)
Immature Granulocytes: 1 %
Lymphocytes Relative: 19 %
Lymphs Abs: 0.6 10*3/uL — ABNORMAL LOW (ref 0.7–4.0)
MCH: 33.7 pg (ref 26.0–34.0)
MCHC: 34.3 g/dL (ref 30.0–36.0)
MCV: 98.2 fL (ref 80.0–100.0)
Monocytes Absolute: 0.3 10*3/uL (ref 0.1–1.0)
Monocytes Relative: 10 %
Neutro Abs: 2 10*3/uL (ref 1.7–7.7)
Neutrophils Relative %: 70 %
Platelets: 83 10*3/uL — ABNORMAL LOW (ref 150–400)
RBC: 2.82 MIL/uL — ABNORMAL LOW (ref 4.22–5.81)
RDW: 20 % — ABNORMAL HIGH (ref 11.5–15.5)
WBC: 2.9 10*3/uL — ABNORMAL LOW (ref 4.0–10.5)
nRBC: 0 % (ref 0.0–0.2)

## 2020-06-07 LAB — GLUCOSE, CAPILLARY
Glucose-Capillary: 116 mg/dL — ABNORMAL HIGH (ref 70–99)
Glucose-Capillary: 121 mg/dL — ABNORMAL HIGH (ref 70–99)
Glucose-Capillary: 127 mg/dL — ABNORMAL HIGH (ref 70–99)
Glucose-Capillary: 146 mg/dL — ABNORMAL HIGH (ref 70–99)
Glucose-Capillary: 152 mg/dL — ABNORMAL HIGH (ref 70–99)
Glucose-Capillary: 165 mg/dL — ABNORMAL HIGH (ref 70–99)

## 2020-06-07 LAB — COMPREHENSIVE METABOLIC PANEL
ALT: 21 U/L (ref 0–44)
AST: 34 U/L (ref 15–41)
Albumin: 3.1 g/dL — ABNORMAL LOW (ref 3.5–5.0)
Alkaline Phosphatase: 242 U/L — ABNORMAL HIGH (ref 38–126)
Anion gap: 10 (ref 5–15)
BUN: 14 mg/dL (ref 8–23)
CO2: 23 mmol/L (ref 22–32)
Calcium: 8.3 mg/dL — ABNORMAL LOW (ref 8.9–10.3)
Chloride: 103 mmol/L (ref 98–111)
Creatinine, Ser: 0.4 mg/dL — ABNORMAL LOW (ref 0.61–1.24)
GFR calc Af Amer: >60 mL/min (ref >60)
GFR calc non Af Amer: >60 mL/min (ref >60)
Glucose, Bld: 127 mg/dL — ABNORMAL HIGH (ref 70–99)
Potassium: 4 mmol/L (ref 3.5–5.1)
Sodium: 136 mmol/L (ref 135–145)
Total Bilirubin: 2.3 mg/dL — ABNORMAL HIGH (ref 0.3–1.2)
Total Protein: 5.3 g/dL — ABNORMAL LOW (ref 6.5–8.1)

## 2020-06-07 LAB — PHOSPHORUS: Phosphorus: 3 mg/dL (ref 2.5–4.6)

## 2020-06-07 LAB — MAGNESIUM: Magnesium: 1.9 mg/dL (ref 1.7–2.4)

## 2020-06-07 MED ORDER — LEVALBUTEROL HCL 0.63 MG/3ML IN NEBU
0.6300 mg | INHALATION_SOLUTION | Freq: Four times a day (QID) | RESPIRATORY_TRACT | Status: DC
  Administered 2020-06-07 – 2020-06-09 (×9): 0.63 mg via RESPIRATORY_TRACT
  Filled 2020-06-07 (×10): qty 3

## 2020-06-07 MED ORDER — LEVALBUTEROL HCL 0.63 MG/3ML IN NEBU: 0.6300 mg | INHALATION_SOLUTION | RESPIRATORY_TRACT | Status: DC | PRN

## 2020-06-07 MED ORDER — IPRATROPIUM BROMIDE 0.02 % IN SOLN
0.5000 mg | Freq: Four times a day (QID) | RESPIRATORY_TRACT | Status: DC
  Administered 2020-06-07 – 2020-06-09 (×9): 0.5 mg via RESPIRATORY_TRACT
  Filled 2020-06-07 (×10): qty 2.5

## 2020-06-07 MED ORDER — TRAVASOL 10 % IV SOLN
INTRAVENOUS | Status: AC
  Filled 2020-06-07: qty 480

## 2020-06-07 NOTE — Progress Notes (Addendum)
PROGRESS NOTE    Jeffrey Harris  DJT:[id number] DOB: 19-Aug-1956 DOA: 05/29/2020 PCP: [name], MD    Chief Complaint  Jeffrey Harris presents with  . 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 Marin Olp, history of gout presented to [facility], ED with generalized weakness, intractable nausea and vomiting.  Jeffrey Harris reported that for the past 4 weeks he has been bouts of nausea and vomiting.  Described as nonbilious, nonbloody vomiting typically after attempting to eat solids.  Jeffrey Harris ported experiencing numerous episodes daily.  Also reported generalized weakness, lightheadedness upon standing up.  Denied any fevers, diarrhea, sick contacts, recent travel.  Recently diagnosed with malignant ascites, undergone paracentesis 3 times since diagnosed on September 3.  Jeffrey Harris last received Gemzar and Abraxane on 9/7 Jeffrey Harris was found to be profoundly volume depleted in ED with moderate hyponatremia, severe hypokalemia, hypochloremia and alkalosis.  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  Postprocedure Jeffrey Harris seemed to be doing fine however Jeffrey Harris was transferred to the floor and noted to be tachycardic initially and subsequently hypotensive, significant pallor, cough.  Concern for septic shock versus acute bleed versus possible perforation as Jeffrey Harris underwent recent procedure with a large mass noted.  Jeffrey Harris transferred to the ICU, initially on Levophed and Neo-Synephrine.  Neo-Synephrine drip weaned off 06/02/2020.  Levophed drip weaned off last night as blood pressure improved after starting Jeffrey Harris back on stress dose IV Solu-Cortef.  Concern for possible adrenal insufficiency as Jeffrey Harris with metastatic disease to the adrenals per recent CT abdomen and pelvis.  Hypotension improved however blood pressure soft and fluctuating.  KVO IV fluids due to concerns for possible volume overload from aggressive fluid resuscitation.  Repeat chest x-ray with widespread airspace opacity throughout the left lung overall slightly increased. Jeffrey Harris noted to have a elevated lactic acid level of 5.6 which has trended down to 1.6, elevated procalcitonin which is trending down.  Blood cultures pending.  Urine strep pneumococcus antigen negative. Urine Legionella antigen negative.  Urinalysis nitrite negative, leukocytes negative.  CT abdomen and pelvis negative for any perforation.  CT chest which was done with dense consolidation and airspace disease throughout the left lung and in the right lung base, small pleural effusions, fluid-filled esophagus possibly indicating reflux or dysmotility, mass in the head of the pancreas without interval enlargement, porta hepatis, celiac axis and retroperitoneal lymph nodes enlarged likely metastatic, diffuse free fluid throughout the abdomen and pelvis likely ascites, bilateral adrenal gland nodules likely metastatic, enlarged prostate gland, no evidence of bowel obstruction or perforation.  Continue empiric  IV cefepime to complete a 7-day course  of treatment.  IV vancomycin discontinued.  Continue Mucinex, Xopenex, Atrovent nebs.  PCCM following and I appreciate input and recommendations.  2 acute respiratory failure with hypoxia secondary to aspiration pneumonia Jeffrey Harris noted to be severe sepsis with septic shock with shortness of breath post endoscopy 06/01/2020.  Chest x-ray CT chest done with a left-sided pneumonia and right basilar pneumonia.  Jeffrey Harris with some initial improvement with respiratory status however noted to have increased O2 requirements.  Currently on 10 L high flow nasal cannula, from 6 L nasal cannula with nonrebreather from 4 L nasal cannula from 8 L high flow nasal cannula from 15 L high flow.  Urine strep pneumococcus antigen negative.  Urine Legionella antigen negative.Jeffrey Harris  Sputum Gram stain and cultures with rare gram-negative rods, moderate gram-positive cocci, rare yeast.  Lasix 20 mg IV x1 ordered per PCCM on 06/05/2020.  IV fluids have been KVO.  Jeffrey Harris with coarse breath sounds noted on the left.  Repeat chest x-ray with widespread airspace opacity throughout the left lung overall slightly increased. Continue IV cefepime, Xopenex, Atrovent nebs.  IV vancomycin has been discontinued.  PCCM following.  3 metastatic pancreatic cancer to the liver with recent diagnosis of malignant ascites status post paracentesis/obstructing mass Jeffrey Harris being followed by oncology, Dr. Marin Olp last received chemotherapy with Gemzar and Abraxane 05/10/2020.  Jeffrey Harris seen by gastroenterology due to concerns for nausea and vomiting, abdominal x-ray done showed no high-grade obstructive bowel gas pattern, no free air, moderate colonic stool burden with CT abdomen and pelvis done showing persistent enlargement of pancreatic head with distal CBD stent in place.  Upper GI series showed moderate to marked partial duodenal obstruction at the level of the pancreatic tumor and stent.  Jeffrey Harris underwent upper  endoscopy 06/01/2020, which revealed a large obstructing duodenal mass.  Gastroenterology recommended general surgery consultation for placement of feeding jejunostomy tube.  General surgery consulted and are following and ordered plain films of the abdomen to assess how large his stomach is to determine whether NG tube temporary for decompression may be helpful.  NG tube attempted 7 times per Jeffrey Harris with no success.  Jeffrey Harris seen by radiation oncology and Jeffrey Harris underwent simulation 06/03/2020 and Jeffrey Harris started palliative radiation, 06/06/2020 to see whether tumor burden causing obstruction can be shrunk in conjunction with chemotherapy.  Per radiation/oncology and oncology.  Palliative care following.  May need peritoneal drain placed.  Oncology per their note feels aggressive intervention is really not going to lengthen Jeffrey Harris's life and would like to discuss end-of-life issues with Jeffrey Harris and wife which will be done tomorrow or Thursday.  Per oncology.  4.  Severe electrolyte  abnormalities/hypokalemia/hyponatremia/hypochloremia secondary to GI losses with intractable nausea and vomiting On admission Jeffrey Harris noted to have a potassium of 2.4, sodium of 127, chloride less than 65, CO2 of 42.  Jeffrey Harris placed on aggressive IV fluid hydration, potassium supplementation.  Serum osmolality at 264, urine osmolality of 682, urine sodium of 144 concern for possible SIADH component due to malignancy.  Abdominal films done with no high-grade obstructive bowel gas pattern, no free air, moderate colonic stool burden.  CT abdomen showed persistent enlargement of pancreatic head with a distal CBD stent in place.  GI was consulted upper GI series showed marked partial duodenal obstruction at the level of pancreatic tumor stent.  Jeffrey Harris underwent upper endoscopy this morning that showed a large obstructing duodenal mass.  Jeffrey Harris currently n.p.o.  General surgery consulted to assess for placement of feeding jejunostomy  tube and likely will  await until respiratory status has improved.  General surgery following..  Replete electrolytes.  PICC line to be placed today and Jeffrey Harris to be started on TPN per general surgery recommendations.  Follow.  5.  Severe dehydration Some concern for volume overload.  IV fluids KVO.  Jeffrey Harris received a dose of Lasix IV x1 yesterday.  Jeffrey Harris noted to have increased O2 requirements.  Repeat chest x-ray with widespread airspace opacity throughout the left lung, overall slightly increased, mild right base atelectasis.  Small pleural effusions bilaterally.  Stable cardiac silhouette.  Port-A-Cath tip in cavoatrial junction.  Currently off pressors and on IV Solu-Cortef.  IV fluids have been KVO.  Follow.  6.  Malignant ascites Last paracentesis 05/23/2020.  Jeffrey Harris with complaints of abdominal fullness and subsequently underwent ultrasound-guided paracentesis with 3.4 L removed on 06/04/2020.  Jeffrey Harris did receive a dose of IV albumin prior to paracentesis.  Jeffrey Harris may need peritoneal drain placed.  Will defer to oncology.  Per oncology.   7.  Prolonged QT interval EKG showed a prolonged QTC of 527 on admission.  Jeffrey Harris noted to have severe electrolyte abnormalities.  Repeat EKG with resolution of QT prolongation.  Keep potassium >4, magnesium >2.  Avoid QT prolongation medications.  Follow.  8.  Constipation Jeffrey Harris stated had bowel movement.  Passing gas.  Jeffrey Harris noted to be refusing Dulcolax suppositories.  Dulcolax suppositories have been changed to as needed.  Follow.   9.  Hyponatremia/hypokalemia/hypophosphatemia Secondary to hypovolemic hyponatremia.  Improved with hydration.  Sodium level at 136.  Potassium currently at 4.  Phosphorus at 3.  Magnesium at 1.9.  Repeat labs in the morning.   10.  Failure to thrive Secondary to metastatic pancreatic cancer.  Palliative care following.  Jeffrey Harris currently n.p.o. secondary to large obstructing pancreatic mass.  General surgery  consulted for placement of jejunostomy tube.  Tube placement on hold due to current respiratory status.  Prealbumin < 5.  Status post IV albumin prior to paracentesis on 06/04/2020.  Jeffrey Harris noted with improvement with blood pressure currently off pressors on IV Solu-Cortef.  PICC line to be placed and Jeffrey Harris to be started on TPN per general surgery recommendations.   DVT prophylaxis: SCDs Code Status: Full Family Communication: Updated Jeffrey Harris.  No family at bedside.  Disposition:   Status is: Inpatient    Dispo: The Jeffrey Harris is from: Home              Anticipated d/c is to: To be determined              Anticipated d/c date is: To be determined.              Jeffrey Harris with septic shock secondary to aspiration pneumonia, on IV antibiotics, 10 L high flow O2, metastatic pancreatic cancer with obstruction.  Not stable for discharge.       Consultants:   General surgery: Dr. Hassell Done 06/01/2020  Palliative care: Dr. Rowe Pavy Jun 01, 2020  Gastroenterology: Dr. Collene Mares May 30, 2020  Oncology: Dr. Marin Olp May 30, 2020  PCCM: Dr Ander Slade 06/01/2020  Procedures:  Upper endoscopy Jun 01, 2020  CT abdomen and pelvis May 30, 2020  Chest x-ray May 30, 2020, May 31, 2020, Jun 01, 2020  CT abdomen and pelvis 06/01/2020  CT chest 06/01/2020  Abdominal films 06/03/2020  Ultrasound-guided paracentesis 06/04/2020--- 3.4 L of clear yellow fluid removed, Luther Parody, PA, IR  Antimicrobials:   IV cefepime Jun 01, 2020>>>> 06/08/2020  IV vancomycin Jun 01, 2020>>>>> 06/02/2020  Subjective: Jeffrey Harris laying in bed.  Feels his breathing is okay.  Denies any chest pain.  Still with some complaints of abdominal fullness.  States PCCM was going to attempt a paracentesis today.  Jeffrey Harris restarted back on IV steroids with improvement in blood pressure and has subsequently been weaned off pressors.   Objective: Vitals:   06/07/20 0810 06/07/20 0823 06/07/20 0900 06/07/20 0931  BP:   (!) 93/53     Pulse: 64  60 88  Resp: 18  16 20   Temp:  97.8 F (36.6 C)    TempSrc:  Oral    SpO2: 97%  97% 94%  Weight:      Height:        Intake/Output Summary (Last 24 hours) at 06/07/2020 1010 Last data filed at 06/07/2020 0000 Gross per 24 hour  Intake 1055.55 ml  Output 75 ml  Net 980.55 ml   Filed Weights   06/01/20 1128 06/05/20 0746 06/07/20 0349  Weight: 78 kg 87.5 kg 87.1 kg    Examination:  General exam: On 10 L high flow nasal cannula with nonrebreather. Respiratory system: Left-sided coarse breath sounds.  No wheezing.  Fair air movement.  Speaking in full sentences.  On 10 L high flow oxygen. Cardiovascular system: RRR no murmurs rubs or gallops.  No JVD.  1-2+ pedal edema.  Gastrointestinal system: Abdomen is soft, nontender, nondistended, positive bowel sounds.  No rebound.  No guarding.  Central nervous system: Alert and oriented. No focal neurological deficits. Extremities: Symmetric 5 x 5 power. Skin: No rashes, lesions or ulcers Psychiatry: Judgement and insight appear normal. Mood & affect somewhat depressed and flattened.     Data Reviewed: I have personally reviewed following labs and imaging studies  CBC: Recent Labs  Lab 06/03/20 0335 06/04/20 0400 06/05/20 0423 06/06/20 0245 06/07/20 0417  WBC 19.7* 11.2* 10.6* 8.3 2.9*  NEUTROABS 18.1* 10.0* 8.8* 6.2 2.0  HGB 10.6* 10.1* 11.2* 11.3* 9.5*  HCT 30.4* 29.2* 32.4* 31.9* 27.7*  MCV 97.1 98.0 97.9 97.3 98.2  PLT 90* 79* 116* 115* 83*    Basic Metabolic Panel: Recent Labs  Lab 06/01/20 1652 06/01/20 1652 06/02/20 0453 06/02/20 0453 06/03/20 0335 06/04/20 0400 06/05/20 0423 06/06/20 0245 06/07/20 0417  NA 122*   < > 128*   < > 133* 133* 138 134* 136  K 3.9   < > 3.9   < > 3.3* 3.7 3.2* 3.3* 4.0  CL 81*   < > 91*   < > 98 102 106 102 103  CO2 25   < > 20*   < > 21* 22 21* 23 23  GLUCOSE 98   < > 184*   < > 187* 129* 105* 101* 127*  BUN 10   < > 15   < > 13 14 15 12 14   CREATININE 0.95   <  > 1.00   < > 0.58* 0.48* 0.47* 0.45* 0.40*  CALCIUM 7.3*   < > 7.0*   < > 7.1* 7.3* 7.7* 7.5* 8.3*  MG 2.0  --  2.2  --   --   --  1.8 1.8 1.9  PHOS  --   --   --   --   --   --  1.2* 2.2* 3.0   < > = values in this interval not displayed.    GFR: Estimated Creatinine Clearance: 103.3 mL/min (A) (by C-G formula based on SCr of 0.4 mg/dL (L)).  Liver Function Tests: Recent Labs  Lab 06/03/20 0335 06/04/20 0400 06/05/20 0423 06/06/20 0245 06/07/20 0417  AST 35 32 39 35 34  ALT 23 21 23 22 21   ALKPHOS 113 140* 212* 215* 242*  BILITOT 1.9* 1.5* 1.9* 1.6* 2.3*  PROT 5.1* 4.9* 5.1* 5.2* 5.3*  ALBUMIN 2.4* 2.2* 2.4* 2.3* 3.1*    CBG: Recent Labs  Lab 06/06/20 1631 06/06/20 2009 06/06/20 2323 06/07/20 0347 06/07/20 0744  GLUCAP 138* 127* 126* 116* 121*     Recent Results (from the past 240 hour(s))  Respiratory Panel by RT PCR (Flu A&B, Covid) - Nasopharyngeal Swab     Status: None   Collection Time: 05/30/20  1:40 AM   Specimen: Nasopharyngeal Swab  Result Value Ref Range Status   SARS Coronavirus 2 by RT PCR NEGATIVE NEGATIVE Final    Comment: (NOTE) SARS-CoV-2 target nucleic acids are NOT DETECTED.  The SARS-CoV-2 RNA is generally detectable in upper respiratoy specimens during the acute phase of infection. The lowest concentration of SARS-CoV-2 viral copies this assay can detect is 131 copies/mL. A negative result does not preclude SARS-Cov-2 infection and should not be used as the sole basis for treatment or other Jeffrey Harris management decisions. A negative result may occur with  improper specimen collection/handling, submission of specimen other than nasopharyngeal swab, presence of viral mutation(s) within the areas targeted by this assay, and inadequate number of viral copies (<131 copies/mL). A negative result must be combined with clinical observations, Jeffrey Harris history, and epidemiological information. The expected result is Negative.  Fact Sheet for  Patients:  PinkCheek.be  Fact Sheet for Healthcare Providers:  GravelBags.it  This test is no t yet approved or cleared by the [place] FDA and  has been authorized for detection and/or diagnosis of SARS-CoV-2 by FDA under an Emergency Use Authorization (EUA). This EUA will remain  in effect (meaning this test can be used) for the duration of the COVID-19 declaration under Section 564(b)(1) of the Act, 21 U.S.C. section 360bbb-3(b)(1), unless the authorization is terminated or revoked sooner.     Influenza A by PCR NEGATIVE NEGATIVE Final   Influenza B by PCR NEGATIVE NEGATIVE Final    Comment: (NOTE) The Xpert Xpress SARS-CoV-2/FLU/RSV assay is intended as an aid in  the diagnosis of influenza from Nasopharyngeal swab specimens and  should not be used as a sole basis for treatment. Nasal washings and  aspirates are unacceptable for Xpert Xpress SARS-CoV-2/FLU/RSV  testing.  Fact Sheet for Patients: PinkCheek.be  Fact Sheet for Healthcare Providers: GravelBags.it  This test is not yet approved or cleared by the [place] FDA and  has been authorized for detection and/or diagnosis of SARS-CoV-2 by  FDA under an Emergency Use Authorization (EUA). This EUA will remain  in effect (meaning this test can be used) for the duration of the  Covid-19 declaration under Section 564(b)(1) of the Act, 21  U.S.C. section 360bbb-3(b)(1), unless the authorization is  terminated or revoked. Performed at [facility], [place] [address] [place], [place] [address]   Culture, sputum-assessment     Status: None   Collection Time: 06/01/20  3:34 PM   Specimen: Sputum  Result Value Ref Range Status   Specimen Description SPU  Final   Special Requests NONE  Final   Sputum evaluation   Final    THIS SPECIMEN IS ACCEPTABLE FOR SPUTUM CULTURE Performed at  [facility], [place] [address], [place], [place] [address]    Report Status 06/01/2020 FINAL  Final  Culture, respiratory     Status: None   Collection Time: 06/01/20  3:34 PM   Specimen: Sputum  Result Value Ref Range Status   Specimen Description   Final    SPU Performed at [facility], [place] [address] [place], [place] [address]    Special Requests   Final    NONE Reflexed from [phone] Performed at [facility], [place] [address] [place], [place] [address]    Gram Stain   Final    FEW WBC PRESENT,BOTH PMN AND MONONUCLEAR RARE GRAM NEGATIVE RODS MODERATE GRAM POSITIVE COCCI RARE YEAST    Culture   Final    FEW Normal respiratory flora-no Staph aureus or Pseudomonas seen Performed at [facility], [address], [place], [place] [address]    Report Status 06/03/2020 FINAL  Final  Culture, blood (Routine X 2) w Reflex to ID Panel     Status: None   Collection Time: 06/01/20  4:38 PM   Specimen: BLOOD  Result Value Ref Range Status   Specimen Description   Final    BLOOD LEFT ANTECUBITAL Performed at [place] [address] [place], [place] [address]    Special Requests   Final    BOTTLES DRAWN AEROBIC ONLY Blood Culture adequate volume Performed at [place] [address], [place], [place] [address]    Culture   Final    NO GROWTH 5 DAYS Performed at [facility], [place] [address] [place], [place] [address]    Report Status 06/06/2020 FINAL  Final  Culture, blood (Routine X 2) w Reflex to ID Panel     Status: None   Collection Time: 06/01/20  4:38 PM   Specimen: BLOOD LEFT HAND  Result Value Ref Range Status   Specimen Description   Final    BLOOD LEFT HAND Performed at [place] [address] [place], [place] [address]    Special Requests   Final    BOTTLES DRAWN AEROBIC ONLY Blood Culture adequate volume Performed at [place] [address], [place], [place] [address]    Culture   Final    NO GROWTH 5 DAYS Performed at [facility], [place] [address], [place], [place] [address]    Report Status 06/06/2020 FINAL  Final  MRSA PCR Screening     Status: None   Collection Time: 06/01/20  5:24 PM   Specimen: Nasal Mucosa; Nasopharyngeal  Result Value Ref Range Status   MRSA by PCR NEGATIVE NEGATIVE Final    Comment:        The GeneXpert MRSA Assay (FDA approved for NASAL specimens only), is one component of a comprehensive MRSA colonization surveillance program. It is not intended to diagnose MRSA infection nor to guide or monitor treatment for MRSA infections. Performed at [facility], [place] [address], [place], [place] [address]   Urine culture     Status: None   Collection Time: 06/02/20 12:23 AM   Specimen: Urine  Result Value Ref Range Status   Specimen Description   Final    URINE, CATHETERIZED Performed at [place] [address] [place], [place] [address]    Special Requests   Final    NONE Performed at [facility], [place] [address], [place], [place] [address]    Culture   Final    NO GROWTH Performed at [facility], [address] [place], [place] [address]    Report Status 06/03/2020 FINAL  Final         Radiology Studies: DG CHEST PORT 1 VIEW  Result Date: 06/06/2020 CLINICAL DATA:  Multifocal airspace opacity EXAM: PORTABLE CHEST 1 VIEW COMPARISON:  June 05, 2020 FINDINGS: Port-A-Cath tip is at the cavoatrial junction. No pneumothorax. There is extensive airspace opacity throughout the left lung, overall slightly increased. There is a small pleural effusion on each side. There is slight right base atelectasis. Right lung otherwise is clear. Heart is mildly enlarged, stable, with pulmonary vascularity normal. No adenopathy. No bone lesions. IMPRESSION: Widespread airspace opacity  throughout the left lung, overall slightly increased. Mild right base atelectasis. Small pleural effusions bilaterally. Stable cardiac silhouette. Port-A-Cath tip at cavoatrial junction. Electronically Signed   By: Lowella Grip III M.D.   On: 06/06/2020 09:42   DG Abd Portable 1V  Result Date: 06/06/2020 CLINICAL DATA:  64 year old male with pancreatic cancer, gastric distension. EXAM: PORTABLE ABDOMEN - 1 VIEW COMPARISON:  Abdominal film 06/04/2019. CT Abdomen and Pelvis 06/02/2020. FINDINGS: Portable AP supine view at 0826 hours. There is moderate to severe gastric distention with air. There is abrupt termination of air at the level of the distal stomach or duodenal bulb. A metal biliary stent is redemonstrated projecting over the mid abdomen and distended stomach. There is a paucity of bowel gas elsewhere throughout the abdomen and pelvis. There is a small volume of gas in the normal rectosigmoid colon. No acute osseous abnormality identified. IMPRESSION: 1. 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 2. Decompressed small and large bowel. Electronically Signed   By: Genevie Ann M.D.   On: 06/06/2020 08:44   [place] EKG SITE RITE  Result Date: 06/07/2020 If Site Rite image not attached, placement could not be confirmed due to current cardiac rhythm.       Scheduled Meds: . Chlorhexidine Gluconate Cloth  6 each Topical Daily  . enoxaparin (LOVENOX) injection  40 mg Subcutaneous Q24H  . hydrocortisone sod succinate (SOLU-CORTEF) inj  50 mg Intravenous Q6H  . insulin aspart  0-9 Units Subcutaneous Q4H  . ipratropium  0.5 mg Nebulization Q6H  . levalbuterol  0.63 mg Nebulization Q6H  . mouth rinse  15 mL Mouth Rinse BID  . pantoprazole (PROTONIX) IV  40 mg Intravenous Q12H  . sodium chloride flush  10-40 mL Intracatheter Q12H   Continuous Infusions: . sodium chloride 10 mL/hr at 06/07/20 0000  . sodium chloride 250 mL (06/07/20 0957)   . ceFEPime (MAXIPIME) IV Stopped (06/07/20 1003)  . chlorproMAZINE (THORAZINE) IV Stopped (06/02/20 0309)  . TPN ADULT (ION)       LOS: 8 days    Time spent: 40 min    Irine Seal, MD [facility]   To contact the attending provider between 7A-7P or the covering provider during after hours 7P-7A, please log into the web site [email or web address] and access using universal [place] password for that web site. If you do not have the password, please call the hospital operator.  06/07/2020, 10:10 AM

## 2020-06-07 NOTE — Progress Notes (Signed)
NAME:  Jeffrey Harris, MRN:  [id number], DOB:  [birth year], LOS: 8 ADMISSION DATE:  05/29/2020, CONSULTATION DATE:  06/01/2020 REFERRING MD:  Dr Grandville Silos, CHIEF COMPLAINT:  shock   Brief History   Asked to see patient for shock, unresponsive to fluids Had endoscopy 9/29 showing a large obstructing duodenal mass  Patient with metastatic pancreatic cancer Carcinomatosis abdominis Found to have an obstructing pancreatic mass  Diagnosed with pancreatic cancer-diagnosed November 2020, has had 1 course of chemotherapy-last chemo 05/10/2020, scheduled for next course with Dr. Marin Olp  Past Medical History   Past Medical History:  Diagnosis Date   Goals of care, counseling/discussion 07/29/2019   Gout    Hyperbilirubinemia 07/2019   Pancreatic cancer metastasized to liver ([place]) 07/29/2019   Significant Hospital Events   Hypotension, altered mental status S/p EGD 06/01/2020->PCCM asked to see when as found altered w/ hypotension 9/30 still on pressors. Still on high FIO2. Added stress dose steroids 10/1 oxygen and pressor requirements better 10/2 continues to improve, not off IV pressors yetPost paracentesis10/2 for 3.4 L 10/3 remains on pressors; steroids added back 10/4 off pressors again    Consults:  PCCM GI Surgery  Procedures:  EGD 06/01/2020 CT chest: 1. Dense consolidation and airspace disease throughout the left lung and in the right lung base. Small pleural effusions. Changes likely to represent pneumonia or possibly aspiration.2. Fluid-filled esophagus, possibly indicating reflux or dysmotility. 3. Mass in the head of the pancreas without interval enlargement. 4. Porta hepatis, celiac axis, and retroperitoneal lymph nodes are enlarged, likely metastatic.5. Diffuse free fluid throughout the abdomen and pelvis, likely ascites.6. Bilateral adrenal gland nodules, likely metastatic.7. Pelvic location of the right kidney.8. Enlarged prostate gland. 9. Aortic  atherosclerosis.10. No evidence of bowel obstruction or perforation. CT abd/pelvis: 1. Dense consolidation and airspace disease throughout the left lung and in the right lung base. Small pleural effusions. Changes likely to represent pneumonia or possibly aspiration. 2. Fluid-filled esophagus, possibly indicating reflux or dysmotility. 3. Mass in the head of the pancreas without interval enlargement. 4. Porta hepatis, celiac axis, and retroperitoneal lymph nodes are enlarged, likely metastatic.5. Diffuse free fluid throughout the abdomen and pelvis, likely ascites.6. Bilateral adrenal gland nodules, likely metastatic.7. Pelvic location of the right kidney.8. Enlarged prostate gland. 9. Aortic atherosclerosis.10. No evidence of bowel obstruction or perforation.  Significant Diagnostic Tests:  Chest x-ray with extensive consolidation left lung field Chest x-ray 10/3-some improvement in infiltrates  Micro Data:  None Blood culture 06/01/2020 Sputum culture 06/01/2020 Antimicrobials:  Cefepime 9/29>>> vanc x 1 day (9/28)   Interim history/subjective:  Off pressors  Objective   Blood pressure (Abnormal) 93/53, pulse 88, temperature 97.8 F (36.6 C), temperature source Oral, resp. rate 20, height 5\' 11"  (1.803 m), weight 87.1 kg, SpO2 94 %.        Intake/Output Summary (Last 24 hours) at 06/07/2020 0958 Last data filed at 06/07/2020 0000 Gross per 24 hour  Intake 1155.55 ml  Output 75 ml  Net 1080.55 ml   Filed Weights   06/01/20 1128 06/05/20 0746 06/07/20 0349  Weight: 78 kg 87.5 kg 87.1 kg    Examination:  General: This is a 64 year old white male is currently resting in bed and in no acute distress HEENT mucous membranes moist no JVD  Pulmonary: Diminished on the left, no accessory use.  Currently 9 L via high flow salter Cardiac: Regular rate and rhythm Abdomen: Soft nontender Extremities: Warm dry brisk capillary refill Neuro: Awake oriented no focal deficits GU:  Clear yellow.  Resolved Hospital Problem list    Lactic acidosis   Assessment & Plan:   Septic shock in the setting of aspiration pneumonia c/b relative adrenal insuff -off pressors since adding back steroid  Plan Cont stress dose steroids today. Could initiate slow taper in am  SBP goal > 90  Acute hypoxic respiratory failure in the setting of aspiration pneumonia Plan NPO pulm hygiene Wean oxygen  Metastatic pancreatic cancer with obstructing duodenal mass -Malignant ascites s/p paracentesis last one on 10/2\ -.appreciate Dr Vernona Rieger note today. I agree can't imagine a good outcome here.  Plan Per ONC Can re-evaluate his abd today for possible therapeutic para. If we do this would only pull off about 2 liters at most and also give albumin  Intermittent Fluid and electrolyte imbalance Plan  trend chemistries   Hyperglycemia Plan ssi   Best practice per primary  [place] ACNP-BC [place] Pager # [phone] OR # [phone] if no answer

## 2020-06-07 NOTE — Progress Notes (Signed)
We were asked to see the patient in regards to whether or not it would be feasible to endoscopically place an NG tube into his stomach for gastric decompression.  His chart was reviewed Dr. Antonieta Pert note reviewed radiology reviewed.  Patient has had 4 different attempts of placement of an NG tube all of which were unsuccessful.  The last attempt was a few days ago over the weekend by radiology and this was also unsuccessful.  The patient states that the NG tube could not get past the nose.  In reviewing radiology's note they stated that review of prior maxillofacial CT scan showed limited clearance between the turbinates and nasal septum which most likely accounted for this.  Given this I do not see how GI can be of any help.  I discussed this with Dr. Lamonte Sakai from the critical care team and raised the question as to whether or not the patient could be sedated for a procedure such as this, or other method of placement of an NG tube, but this would be dangerous given his pulmonary status according to the critical care team.  At this point therefore we have no plans for endoscopic placement of an NG tube.

## 2020-06-07 NOTE — Progress Notes (Signed)
Bedside US  I evaluated his abd to assess amt of ascites. On [place] evaluation he clearly demonstrates mod amt of free flowing ascites. There is not such volume that I believe it is contributing to his pulmonary fxn but he reports it does contribute to his comfort. As he is just now off pressors would prefer to hold off on therapeutic paracentesis as would like to avoid exacerbating hypotension and possibly giving him a new renal insult. I have read Dr Antonieta Pert note re: plan to discuss goals of care w/ the patient and his wife. If we are going to place a drain This will need to be with the understanding that this is palliative... as it will exacerbate albumin loss and further contribute to malnutrition and physical decline.. If comfort is our primary goal (which I think appropriate) then a peritoneal drain by IR seems appropriate. If this is going to be the plan I will hold off on paracentesis as having a sig amount of ascites will certainly facilitate easier placement of this drain should that be the route that Onc and the patient desire to go.   [name] ACNP-BC [place] Pager # [phone] OR # [phone] if no answer

## 2020-06-07 NOTE — Progress Notes (Signed)
PHARMACY - TOTAL PARENTERAL NUTRITION CONSULT NOTE   Indication: duodenal obstruction secondary to pancreatic cancer  Patient Measurements: Height: _0  (180.3 cm) Weight: 87.1 kg (192 lb 0.3 oz) IBW/kg (Calculated) : 75.3 TPN AdjBW (KG): 78 Body mass index is 26.78 kg/m. Usual Weight:   Assessment: 64 year old man with metastatic prostate cancer and peritoneal carcinomatosis, new obstructing duodenal pancreatic mass.  Undergoing chemotherapy.  He was admitted with shock and encephalopathy, suspected left aspiration pneumonia.  Undergoing radiation therapy since 10/4. Malignant ascites s/p paracentesis 10/2 NG attempted 7 times w/ no success IR does not plan to place G-tube - per IR PA and Dr. Vernard Gambles the risk of contamination of the peritoneal space is too high given the patients significant ascites. Per 10/5 [facility] note "At this point the only team who has successfully accessed the patients stomach is GI during endoscopy -- it may be reasonable to ask them to place an NG for decompression vs PEG tube." Pharmacy consulted to start TPN 10/5 for nutrition support  Glucose / Insulin: no hx DM, on solucortef 50 q6> now off levophed, on sSSI q4h, got 4 units SSI/24hs, CBGs all < 150 Electrolytes: Lytes WNL today after repletion. S/p Mag repletion 10/3& 10/4, s/p Kphos 10/3 & 10/4 & K runs 10/3 & 10/4 Renal: WNL LFTs / TGs: Alk phos rising 242 today, T bili rising 2.3 today- not jaundiced so will get full trace elements Prealbumin / albumin: 9/29 & 10/1 prealbumin < 5; albumin 3.1 Intake / Output; MIVF: I/O + 1180. Foley out; NS at 10 ml/hr GI Imaging: Surgeries / Procedures:   Central access: has PAC, PICC ordered for TPN 10/5 TPN start date: 06/07/2020  Nutritional Goals  Pending RD consult   Current Nutrition:  NPO  Plan:  Start TPN at 40 mL/hr at 1800 and assess tolerance Electrolytes in TPN: 64mq/L of Na, 573m/L of K, 82m81mL of Ca, 82mE52m of Mg, and 182mm69m of Phos. Cl:Ac ratio  1:1 Add standard MVI and trace elements to TPN continue Sensitive q4h SSI and adjust as needed  Monitor TPN labs on Mon/Thurs F/u RD consult for goals  MicheEudelia Bunchrm.D 06/07/2020 9:23 AM

## 2020-06-07 NOTE — TOC Progression Note (Signed)
Transition of Care [facility]) - Progression Note    Patient Details  Name: NICHOLI GHUMAN MRN: [id number] Date of Birth: 1956-07-05  [facility] CM/SW Contact  Leeroy Cha, RN Phone Number: 06/07/2020, 11:28 AM  Clinical Narrative:    Carolynn Sayers with adortation hhc and iv infusion alerted that patient may go home with iv tpn.   Expected Discharge Plan: [place] Barriers to Discharge: Barriers Unresolved (comment) (continued shock and treatment)  Expected Discharge Plan and Services Expected Discharge Plan: [place]   Discharge Planning Services: CM Consult   Living arrangements for the past 2 months: Single Family Home                           HH Arranged: RN, NIV [place]: [place] ([place]) Date [place]: 06/07/20 Time [place]: 1128 Representative spoke with at [place]: [place] ([place]) Interventions    Readmission Risk Interventions No flowsheet data found.

## 2020-06-07 NOTE — Progress Notes (Signed)
Nutrition Follow-up  DOCUMENTATION CODES:   Not applicable  INTERVENTION:  - TPN initiation and management per Pharmacist. - Monitor magnesium, potassium, and phosphorus daily for at least 3 days, MD to replete as needed, as pt is at risk for refeeding syndrome given very poor oral intake PTA and since admission.  NUTRITION DIAGNOSIS:   Increased nutrient needs related to acute illness, catabolic illness, cancer and cancer related treatments as evidenced by estimated needs.  GOAL:   Patient will meet greater than or equal to 90% of their needs  MONITOR:   Diet advancement, Labs, Weight trends, Other (Comment) (TPN)  REASON FOR ASSESSMENT:   Consult New TPN/TNA  ASSESSMENT:   64 year old male with medical history of pancreatic adenocarcinoma with metastasis to liver, malignant ascites, and gout. He presented to the ED with generalized weakness and intractable N/V x4 weeks that typically occurs after consuming solid foods. He has had paracentesis x3 since 05/06/20.  He was admitted on 9/26 and was NPO at that time. Diet advanced to CLD on 9/27 at 0640 and then to FLD that afternoon. Diet was changed back to CLD on 9/28 at 1326 and then NPO on 9/29 at 0125. He has been NPO since 9/29.  Patient reports that for awhile/unknown amount of time at home, he was unable to tolerate PO items as all items consumed would be vomited. He was unable to tolerate liquids while on a liquid diet here in the hospital.  Patient was discussed in rounds and briefly with RN and Pharmacist. He has a port in R chest. Plan is to place PICC for TPN.   He is having some abdominal pressure but is hopeful that a paracentesis will be able to be done on 10/6 and that this will provide relief and help his breathing.   Weight today is 192 lb and weight on 9/28 was 172 lb. Weight today is consistent with weight 5/26-7/21, but suspect that this is d/t fluid accumulation.  Per notes: - severe sepsis with septic  shock on admission 2/2 aspiration PNA - no s/s of bowel obstruction or perforation - acute respiratory failure 2/2 aspiration PNA - metastatic pancreatic cancer with liver mets and malignant ascites with upper GI showing moderate partial duodenal obstruction - palliative XRT started on 10/4 - FTT - Full Code  No plans at this time for feeding tube d/t IR, GI, and Surgery all indicating inability by their services   Labs reviewed; CBGs: 116, 121, 127 mg/dl, creatinine: 0.4 mg/dl, Ca: 8.3 mg/dl, alk phos elevated.  Medications reviewed; 50 mg solu-cortef QID, sliding scale novolog, 40 mg IV protonix BID, 10 mEq IV KCl x4 runs 10/4, 20 mmol IV KPhos x1 run 10/4.    NUTRITION - FOCUSED PHYSICAL EXAM:  deferred at this time.   Diet Order:   Diet Order            Diet NPO time specified  Diet effective midnight                 EDUCATION NEEDS:   No education needs have been identified at this time  Skin:  Skin Assessment: Reviewed RN Assessment  Last BM:  10/4  Height:   Ht Readings from Last 1 Encounters:  06/01/20 _0  (1.803 m)    Weight:   Wt Readings from Last 1 Encounters:  06/07/20 87.1 kg     Estimated Nutritional Needs:  Kcal:  2400-2675 kcal Protein:  120-135 grams Fluid:  >/= 1.5 L/day  Charleton Deyoung, MS, RD, LDN, CNSC Inpatient Clinical Dietitian RD pager # available in AMION  After hours/weekend pager # available in AMION  

## 2020-06-07 NOTE — Progress Notes (Signed)
Mr. Schnoor actually sounds better today.  He is now off pressor medication.  The arterial line has been discontinued out of the left wrist.  He had radiation yesterday.  He says he might need another paracentesis.  I think if he has 1, he will definitely need to have a indwelling catheter put in for drainage in the future.  He does sound better.  He is hungry.  I know he cannot eat because of the obstruction.  I know that he is not ready for any kind of procedure for a feeding tube.  His lungs still sound a little bit rough.  His labs show white cell count 2.9.  Hemoglobin 9.5.  Platelet count 83,000.  His albumin is 3.1.  Bilirubin 2.3.  His BUN is 14 creatinine 0.4.  There is no problems with pain.  He has had no problems with bleeding.  He has had no issues with nausea or vomiting.  I spoke to his wife yesterday.  I am going to have her come in when I talk to him about end-of-life issues.  I probably will do this tomorrow or Thursday.  I think that given his prealbumin less than 5, he just is not going to be a candidate for any further chemotherapy.  Again radiation therapy is just to try to palliate him improve his quality of life so he can eat.  I know this is can be very hard for him.  He has always wanted to be aggressive.  I think we are at the point however that aggressive intervention is really not going to lengthen his life.  Again, I will be the one to talk to him about all of this.  I just want to make sure that his wife is there also.  Lattie Haw, MD  2 [place] 4:6-8

## 2020-06-07 NOTE — Progress Notes (Addendum)
6 Days Post-Op  Subjective: Belching is improving. Denies nausea or emesis. Having BMs - reports small amt red blood in his BM yesterday. Denies flatus - feels it has been a couple days since he passed gas.  Reports a lot of drainage of ascites from [facility] yesterday where they did his paracentesis last time.   IR does not plan to place G-tube - per IR PA and Dr. Vernard Gambles the risk of contamination of the peritoneal space is too high given the patients significant ascites.  Off of pressor support, remains on 9-10 L O2 via [place]   ROS: See above, otherwise other systems negative  Objective: Vital signs in last 24 hours: Temp:  [97.6 F (36.4 C)-98.9 F (37.2 C)] 97.8 F (36.6 C) (10/05 0746) Pulse Rate:  [48-105] 72 (10/05 0456) Resp:  [9-35] 17 (10/05 0456) BP: (93-149)/(54-84) 98/60 (10/05 0456) SpO2:  [86 %-100 %] 95 % (10/05 0456) Weight:  [87.1 kg] 87.1 kg (10/05 0349) Last BM Date: 06/06/20  Intake/Output from previous day: 10/04 0701 - 10/05 0700 In: 1255.6 [I.V.:206.5; IV Piggyback:1049] Out: 75 [Urine:75] Intake/Output this shift: No intake/output data recorded.  PE: Heart: regular Lungs: non-labored respirations on [place], diminished breath sounds BL bases Abd: soft, upper abdominal fullness, hypoactive bowel sounds, NT Pscyh: A&Ox3, flat affect   Lab Results:  Recent Labs    06/06/20 0245 06/07/20 0417  WBC 8.3 2.9*  HGB 11.3* 9.5*  HCT 31.9* 27.7*  PLT 115* 83*   BMET Recent Labs    06/06/20 0245 06/07/20 0417  NA 134* 136  K 3.3* 4.0  CL 102 103  CO2 23 23  GLUCOSE 101* 127*  BUN 12 14  CREATININE 0.45* 0.40*  CALCIUM 7.5* 8.3*   PT/INR Recent Labs    06/04/20 0802  LABPROT 23.3*  INR 2.2*   CMP     Component Value Date/Time   NA 136 06/07/2020 0417   K 4.0 06/07/2020 0417   CL 103 06/07/2020 0417   CO2 23 06/07/2020 0417   GLUCOSE 127 (H) 06/07/2020 0417   BUN 14 06/07/2020 0417   CREATININE 0.40 (L) 06/07/2020 0417   CREATININE 0.58  (L) 05/17/2020 1020   CALCIUM 8.3 (L) 06/07/2020 0417   PROT 5.3 (L) 06/07/2020 0417   ALBUMIN 3.1 (L) 06/07/2020 0417   AST 34 06/07/2020 0417   AST 48 (H) 05/17/2020 1020   ALT 21 06/07/2020 0417   ALT 27 05/17/2020 1020   ALKPHOS 242 (H) 06/07/2020 0417   BILITOT 2.3 (H) 06/07/2020 0417   BILITOT 1.2 05/17/2020 1020   GFRNONAA >60 06/07/2020 0417   GFRNONAA >60 05/17/2020 1020   GFRAA >60 06/07/2020 0417   GFRAA >60 05/17/2020 1020   Lipase     Component Value Date/Time   LIPASE 29 05/29/2020 2247       Studies/Results: DG CHEST PORT 1 VIEW  Result Date: 06/06/2020 CLINICAL DATA:  Multifocal airspace opacity EXAM: PORTABLE CHEST 1 VIEW COMPARISON:  June 05, 2020 FINDINGS: Port-A-Cath tip is at the cavoatrial junction. No pneumothorax. There is extensive airspace opacity throughout the left lung, overall slightly increased. There is a small pleural effusion on each side. There is slight right base atelectasis. Right lung otherwise is clear. Heart is mildly enlarged, stable, with pulmonary vascularity normal. No adenopathy. No bone lesions. IMPRESSION: Widespread airspace opacity throughout the left lung, overall slightly increased. Mild right base atelectasis. Small pleural effusions bilaterally. Stable cardiac silhouette. Port-A-Cath tip at cavoatrial junction. Electronically Signed  By: Lowella Grip III M.D.   On: 06/06/2020 09:42   DG Abd Portable 1V  Result Date: 06/06/2020 CLINICAL DATA:  64 year old male with pancreatic cancer, gastric distension. EXAM: PORTABLE ABDOMEN - 1 VIEW COMPARISON:  Abdominal film 06/04/2019. CT Abdomen and Pelvis 06/02/2020. FINDINGS: Portable AP supine view at 0826 hours. There is moderate to severe gastric distention with air. There is abrupt termination of air at the level of the distal stomach or duodenal bulb. A metal biliary stent is redemonstrated projecting over the mid abdomen and distended stomach. There is a paucity of bowel gas  elsewhere throughout the abdomen and pelvis. There is a small volume of gas in the normal rectosigmoid colon. No acute osseous abnormality identified. IMPRESSION: 1. 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 2. Decompressed small and large bowel. Electronically Signed   By: Genevie Ann M.D.   On: 06/06/2020 08:44    Anti-infectives: Anti-infectives (From admission, onward)   Start     Dose/Rate Route Frequency Ordered Stop   06/02/20 0500  vancomycin (VANCOREADY) IVPB 1500 mg/300 mL  Status:  Discontinued        1,500 mg 150 mL/hr over 120 Minutes Intravenous STAT 06/01/20 1649 06/01/20 2051   06/02/20 0500  vancomycin (VANCOREADY) IVPB 1500 mg/300 mL  Status:  Discontinued        1,500 mg 150 mL/hr over 120 Minutes Intravenous Every 12 hours 06/01/20 2051 06/02/20 1333   06/01/20 1700  vancomycin (VANCOREADY) IVPB 1750 mg/350 mL        1,750 mg 175 mL/hr over 120 Minutes Intravenous STAT 06/01/20 1647 06/02/20 1745   06/01/20 1600  ceFEPIme (MAXIPIME) 2 g in sodium chloride 0.9 % 100 mL IVPB        2 g 200 mL/hr over 30 Minutes Intravenous Every 8 hours 06/01/20 1458 06/08/20 2359   05/30/20 1930  metroNIDAZOLE (FLAGYL) IVPB 500 mg  Status:  Discontinued        500 mg 100 mL/hr over 60 Minutes Intravenous Every 8 hours 05/30/20 1859 05/31/20 1103       Assessment/Plan Aspiration PNA with sepsis - improving, off of pressor support, chest CT 9/30 showed significant disease in left lung.  Weaning pressors.  Acute hypoxic respiratory failure Prolonged QT interval Failure to thrive     Duodenal obstruction secondary to metastatic pancreatic cancer -  As of right now patient is indicating he wants to be aggressive with his care despite his poor prognosis.   -  radiation therapy per Dr. Marin Olp, no plans for chemo at this time - Failed attempt at NG decompression this weekend under fluoro. IR without plans to place IR g-tube due  to risk of peritoneal contamination in the setting of malignant ascites. At this point the only team who has successfully accessed the patients stomach is GI during endoscopy -- it may be reasonable to ask them to place an NG for decompression vs PEG tube. - would start TNA today, will discuss with TRH.  - noted resumption of stress dose steroids yesterday.  - Patient with an overall poor prognosis, prealbumin <5, on steroids, being treated for PNA. Significant perioperative morbidity, will continue to follow closely.   FEN - npo, PICC AND TPN if TRH agrees. VTE - scds ID - Maxipime 9/29 >> aspiration PNA    LOS: 8 days    Jill Alexanders , [facility] Surgery 06/07/2020, 7:52 AM Please see Amion for pager number  during day hours 7:00am-4:30pm or 7:00am -11:30am on weekends

## 2020-06-08 ENCOUNTER — Ambulatory Visit: Payer: BC Managed Care – PPO

## 2020-06-08 ENCOUNTER — Other Ambulatory Visit: Payer: BC Managed Care – PPO

## 2020-06-08 ENCOUNTER — Ambulatory Visit: Payer: BC Managed Care – PPO | Admitting: Hematology & Oncology

## 2020-06-08 ENCOUNTER — Ambulatory Visit
Admit: 2020-06-08 | Discharge: 2020-06-08 | Disposition: A | Discharge status: Home / Self Care | Payer: BC Managed Care – PPO | Attending: Radiation Oncology | Admitting: Radiation Oncology

## 2020-06-08 DIAGNOSIS — C259 Malignant neoplasm of pancreas, unspecified: Secondary | ICD-10-CM | POA: Diagnosis not present

## 2020-06-08 DIAGNOSIS — C772 Secondary and unspecified malignant neoplasm of intra-abdominal lymph nodes: Secondary | ICD-10-CM

## 2020-06-08 DIAGNOSIS — R6520 Severe sepsis without septic shock: Secondary | ICD-10-CM | POA: Diagnosis not present

## 2020-06-08 DIAGNOSIS — A419 Sepsis, unspecified organism: Secondary | ICD-10-CM | POA: Diagnosis not present

## 2020-06-08 LAB — TRIGLYCERIDES: Triglycerides: 126 mg/dL (ref <150)

## 2020-06-08 LAB — COMPREHENSIVE METABOLIC PANEL
ALT: 22 U/L (ref 0–44)
AST: 30 U/L (ref 15–41)
Albumin: 2.8 g/dL — ABNORMAL LOW (ref 3.5–5.0)
Alkaline Phosphatase: 288 U/L — ABNORMAL HIGH (ref 38–126)
Anion gap: 8 (ref 5–15)
BUN: 20 mg/dL (ref 8–23)
CO2: 24 mmol/L (ref 22–32)
Calcium: 8.2 mg/dL — ABNORMAL LOW (ref 8.9–10.3)
Chloride: 103 mmol/L (ref 98–111)
Creatinine, Ser: 0.41 mg/dL — ABNORMAL LOW (ref 0.61–1.24)
GFR calc non Af Amer: >60 mL/min (ref >60)
Glucose, Bld: 215 mg/dL — ABNORMAL HIGH (ref 70–99)
Potassium: 4 mmol/L (ref 3.5–5.1)
Sodium: 135 mmol/L (ref 135–145)
Total Bilirubin: 1.7 mg/dL — ABNORMAL HIGH (ref 0.3–1.2)
Total Protein: 5.4 g/dL — ABNORMAL LOW (ref 6.5–8.1)

## 2020-06-08 LAB — DIFFERENTIAL
Abs Immature Granulocytes: 0.02 10*3/uL (ref 0.00–0.07)
Basophils Absolute: 0 10*3/uL (ref 0.0–0.1)
Basophils Relative: 0 %
Eosinophils Absolute: 0 10*3/uL (ref 0.0–0.5)
Eosinophils Relative: 0 %
Immature Granulocytes: 1 %
Lymphocytes Relative: 15 %
Lymphs Abs: 0.4 10*3/uL — ABNORMAL LOW (ref 0.7–4.0)
Monocytes Absolute: 0.2 10*3/uL (ref 0.1–1.0)
Monocytes Relative: 8 %
Neutro Abs: 2.2 10*3/uL (ref 1.7–7.7)
Neutrophils Relative %: 76 %

## 2020-06-08 LAB — CBC
HCT: 31 % — ABNORMAL LOW (ref 39.0–52.0)
Hemoglobin: 10.4 g/dL — ABNORMAL LOW (ref 13.0–17.0)
MCH: 33.1 pg (ref 26.0–34.0)
MCHC: 33.5 g/dL (ref 30.0–36.0)
MCV: 98.7 fL (ref 80.0–100.0)
Platelets: 99 10*3/uL — ABNORMAL LOW (ref 150–400)
RBC: 3.14 MIL/uL — ABNORMAL LOW (ref 4.22–5.81)
RDW: 20.6 % — ABNORMAL HIGH (ref 11.5–15.5)
WBC: 2.9 10*3/uL — ABNORMAL LOW (ref 4.0–10.5)
nRBC: 0 % (ref 0.0–0.2)

## 2020-06-08 LAB — GLUCOSE, CAPILLARY
Glucose-Capillary: 209 mg/dL — ABNORMAL HIGH (ref 70–99)
Glucose-Capillary: 193 mg/dL — ABNORMAL HIGH (ref 70–99)
Glucose-Capillary: 211 mg/dL — ABNORMAL HIGH (ref 70–99)
Glucose-Capillary: 192 mg/dL — ABNORMAL HIGH (ref 70–99)
Glucose-Capillary: 251 mg/dL — ABNORMAL HIGH (ref 70–99)

## 2020-06-08 LAB — PHOSPHORUS: Phosphorus: 3.5 mg/dL (ref 2.5–4.6)

## 2020-06-08 LAB — PREALBUMIN: Prealbumin: 6 mg/dL — ABNORMAL LOW (ref 18–38)

## 2020-06-08 LAB — MAGNESIUM: Magnesium: 2 mg/dL (ref 1.7–2.4)

## 2020-06-08 LAB — CANCER ANTIGEN 19-9: CA 19-9: 9303 U/mL — ABNORMAL HIGH (ref 0–35)

## 2020-06-08 MED ORDER — TRACE MINERALS CU-MN-SE-ZN 300-55-60-3000 MCG/ML IV SOLN
INTRAVENOUS | Status: AC
  Filled 2020-06-08: qty 896

## 2020-06-08 MED ORDER — FENTANYL CITRATE (PF) 100 MCG/2ML IJ SOLN
12.5000 ug | INTRAMUSCULAR | Status: DC | PRN
  Administered 2020-06-08: 12.5 ug via INTRAVENOUS
  Administered 2020-06-09 (×3): 25 ug via INTRAVENOUS
  Filled 2020-06-08 (×4): qty 2

## 2020-06-08 MED ORDER — HYDROCORTISONE NA SUCCINATE PF 100 MG IJ SOLR
50.0000 mg | Freq: Every day | INTRAMUSCULAR | Status: DC
  Administered 2020-06-09: 50 mg via INTRAVENOUS
  Filled 2020-06-08: qty 2

## 2020-06-08 MED ORDER — HYDROCORTISONE NA SUCCINATE PF 100 MG IJ SOLR: 50.0000 mg | Freq: Three times a day (TID) | INTRAMUSCULAR | Status: DC

## 2020-06-08 MED ORDER — ALBUMIN HUMAN 25 % IV SOLN
25.0000 g | Freq: Once | INTRAVENOUS | Status: AC
  Administered 2020-06-08: 25 g via INTRAVENOUS
  Filled 2020-06-08: qty 100

## 2020-06-08 NOTE — Progress Notes (Signed)
I had a long talk with Mr. Mccubbins, his wife and sister this evening. I told him what was going on from my perspective. I think that the incredibly low prealbumin is clearly a accurate indicator for what is going on and for his prognosis.  I told him that we really have no other options for him for treatment with chemotherapy. His body just is not strong enough to take any more treatment.  I have told him that given his low prealbumin, I really do not think that he would survive more than 4-6 weeks.  We really need to change our focus now to his quality of life and to comfort care. I do not see that a feeding tube is going to be necessary. I would just worry that if a feeding tube was placed he would just develop obstruction somewhere else and then not be able to utilize nutrition.  We talked about end-of-life issues. We talked about his wishes for being kept alive on machines artificially. He DOES NOT want to be kept alive on machines he does not want to be a vegetable nor exist. As such, he is a DO NOT RESUSCITATE. I totally agree with this.  He really wants to go home. He would like to go home by Friday if possible. The one problem that we have is that he is getting radiation right now. Radiation is strictly palliative to try to help open up his obstruction. I do not know if radiation is going to work but at least it is something that we can try to help improve his quality of life and see if he cannot eat again. Possibly, radiation can be transferred over to [facility] which is a lot closer to where he lives.  I think a G-tube would be worthwhile to decompress his stomach. This way, he will be able to have liquids and at least enjoy some of the taste. I think this would help his quality of life also. I will have to see if interventional radiology can do this for [place].  I know this is terrible for him and his family. He has tried everything that he could possibly try. He is given [place] all that he could  possibly give [place]. His body just is not capable of tolerating any more stress and I just want him to have quality of life for whatever time he has left.  Hopefully, he will be able to go home on Friday, or Saturday at the latest.  I just hate that we had a have this discussion. However, I told him that I would always be honest with him whether be good news or bad news.  He understood what was going on and what was having with his cancer. He understood that no further systemic therapy was possible. Again, he just wanted to be home and be able to pass away at home when the time comes.  Lattie Haw, MD  2 Timothy 4:6-8

## 2020-06-08 NOTE — Progress Notes (Signed)
Consent for bedside paracentesis obtained this morning by this RN. All questions answered at this time by Marni Griffon, critical care NP. Pt tolerated procedure well;  2600 mLs of ascitic fluid removed without complication. 2 runs of albumin were given prior to procedure as ordered. This RN will continue to carefully monitor pt.

## 2020-06-08 NOTE — Progress Notes (Signed)
Actually, [name] does look a little better.  He sounds a little bit stronger.  He is still off the pressor support.  His blood pressure is gradually improving.  His heart rate is not going up.  Oxygen saturation is 98%.  Hopefully, he will have a paracentesis today.  I still think that a indwelling peritoneal catheter would be a good thing.  I realize that he does look in sound better.  However, I still do not believe that he is a candidate for any further chemotherapy.  The prealbumin less than 5 is just a very ominous factor.  In addition, he is already had two different courses of chemotherapy.  Any further chemotherapy probably has less than a 10% chance of helping.  I will have to have a talk with him and his wife.  I will try to have this talk this evening.  He is taking radiation therapy to try to help with the duodenal obstruction.  He has had two doses of radiation.  His labs show white cell count of 2.9.  Hemoglobin 10.4.  Platelet count 99,000.  His albumin is 2.8.  Bilirubin 1.7.  BUN is 20 creatinine is 0.41.  He has had no vomiting.  He still feels like he has some fluid in his stomach.  I was not sure how this can be removed.  I know you tried an NG tube on him.  Maybe they can try again.  It looks like he is on some TNA right now.  This seems reasonable.  His vital signs all look stable.  His temperature is 96.8.  Pulse 65.  Blood pressure 103/64.  His lungs sound look better.  He still has some crackles bilaterally.  Cardiac exam regular rate and rhythm.  Abdomen is soft.  There may be some slight distention.  Is really hard to tell if there is a fluid wave.  Extremities shows some edema in his lower legs.  Neurological exam is nonfocal.  Again, [name] has the progressive pancreatic cancer.  His duodenal obstruction.  We are trying to improve the obstruction so that he can least eat.  I know that this is not going to be easy conversation with him regarding the future.  He  is always been quite aggressive with his approach.  Again, I just do not see that he is strong enough to take any additional therapy and that additional therapy would have a high likelihood of decreasing his quality of life and hastening his passing.  His wife does understand what is going on as I talked her about all this.  If he does have a paracentesis, then a peritoneal drain should be placed so that he does not need to keep getting stuck with a needle.  I really appreciate the outstanding care that he has gotten from all the staff down the ICU.  The staff is such a blessing to me in my patients.  I am grateful for their compassion.  Lattie Haw, MD  Hebrews 10:24

## 2020-06-08 NOTE — Progress Notes (Signed)
NAME:  Jeffrey Harris, MRN:  [id number], DOB:  Sep 24, 1955, LOS: 9 ADMISSION DATE:  05/29/2020, CONSULTATION DATE:  06/01/2020 REFERRING MD:  Dr Grandville Silos, CHIEF COMPLAINT:  shock   Brief History   Asked to see patient for shock, unresponsive to fluids Had endoscopy 9/29 showing a large obstructing duodenal mass  Patient with metastatic pancreatic cancer Carcinomatosis abdominis Found to have an obstructing pancreatic mass  Diagnosed with pancreatic cancer-diagnosed November 2020, has had 1 course of chemotherapy-last chemo 05/10/2020, scheduled for next course with Dr. Marin Olp  Past Medical History   Past Medical History:  Diagnosis Date  . Goals of care, counseling/discussion 07/29/2019  . Gout   . Hyperbilirubinemia 07/2019  . Pancreatic cancer metastasized to liver ([place]) 07/29/2019   Significant Hospital Events   Hypotension, altered mental status S/p EGD 06/01/2020->PCCM asked to see when as found altered w/ hypotension 9/30 still on pressors. Still on high FIO2. Added stress dose steroids 10/1 oxygen and pressor requirements better 10/2 continues to improve, not off IV pressors yetPost paracentesis10/2 for 3.4 L 10/3 remains on pressors; steroids added back 10/4 off pressors again    Consults:  PCCM GI Surgery  Procedures:  EGD 06/01/2020 CT chest: 1. Dense consolidation and airspace disease throughout the left lung and in the right lung base. Small pleural effusions. Changes likely to represent pneumonia or possibly aspiration.2. Fluid-filled esophagus, possibly indicating reflux or dysmotility. 3. Mass in the head of the pancreas without interval enlargement. 4. Porta hepatis, celiac axis, and retroperitoneal lymph nodes are enlarged, likely metastatic.5. Diffuse free fluid throughout the abdomen and pelvis, likely ascites.6. Bilateral adrenal gland nodules, likely metastatic.7. Pelvic location of the right kidney.8. Enlarged prostate gland. 9. Aortic  atherosclerosis.10. No evidence of bowel obstruction or perforation. CT abd/pelvis: 1. Dense consolidation and airspace disease throughout the left lung and in the right lung base. Small pleural effusions. Changes likely to represent pneumonia or possibly aspiration. 2. Fluid-filled esophagus, possibly indicating reflux or dysmotility. 3. Mass in the head of the pancreas without interval enlargement. 4. Porta hepatis, celiac axis, and retroperitoneal lymph nodes are enlarged, likely metastatic.5. Diffuse free fluid throughout the abdomen and pelvis, likely ascites.6. Bilateral adrenal gland nodules, likely metastatic.7. Pelvic location of the right kidney.8. Enlarged prostate gland. 9. Aortic atherosclerosis.10. No evidence of bowel obstruction or perforation.  Significant Diagnostic Tests:  Chest x-ray with extensive consolidation left lung field Chest x-ray 10/3-some improvement in infiltrates  Micro Data:  None Blood culture 06/01/2020 Sputum culture 06/01/2020 Antimicrobials:  Cefepime 9/29>>> vanc x 1 day (9/28)   Interim history/subjective:  C/o abd distention   Objective   Blood pressure 103/64, pulse 65, temperature (Abnormal) 96.8 F (36 C), temperature source Oral, resp. rate 20, height 5\' 11"  (1.803 m), weight 85.2 kg, SpO2 97 %.        Intake/Output Summary (Last 24 hours) at 06/08/2020 0846 Last data filed at 06/08/2020 0600 Gross per 24 hour  Intake 1006.07 ml  Output 151 ml  Net 855.07 ml   Filed Weights   06/07/20 0349 06/07/20 2214 06/08/20 0200  Weight: 87.1 kg 85.2 kg 85.2 kg    Examination:  General this is a chronically ill appearing 64 year old male.  hent NCAT no JVD MMM pulm dec bases. Now 2 lpm Card rrr abd not tender  Ext warm and dry  Neuro intact   Resolved Hospital Problem list    Lactic acidosis   Assessment & Plan:   Septic shock in the setting of  aspiration pneumonia c/b relative adrenal insuff -off pressors since adding back  steroid remains stable Plan Slow taper steroids over 5-7 days  Acute hypoxic respiratory failure in the setting of aspiration pneumonia Plan Wean oxygen Keep NPO Mobilize if able  Metastatic pancreatic cancer with obstructing duodenal mass -Malignant ascites s/p paracentesis last one on 10/2\ -.appreciate Dr Vernona Rieger note today. I agree can't imagine a good outcome here.  Plan Goals of care per ONC I can do therapeutic peracentesis today-->If he agrees to peritoneal drain He needs to understand that his nutritional status will decline even more rapidly. I will also leave this up to Dr Marin Olp to discuss.   Intermittent Fluid and electrolyte imbalance Plan  trend chemistries    Hyperglycemia Plan ssi   Best practice per primary  And ONC  We are signing off effective today after paracentesis   [name] ACNP-BC [place] Pager # [phone] OR # [phone] if no answer

## 2020-06-08 NOTE — Procedures (Signed)
Paracentesis Procedure Note  Jeffrey Harris  [id number]  1956/06/26  Date:06/08/20  Time:1:05 PM   Provider Performing:Pete E Kary Kos    Procedure: Paracentesis with imaging guidance (82500)  Indication(s) Ascites  Consent Risks of the procedure as well as the alternatives and risks of each were explained to the patient and/or caregiver.  Consent for the procedure was obtained and is signed in the bedside chart  Anesthesia Topical only with 1% lidocaine    Time Out Verified patient identification, verified procedure, site/side was marked, verified correct patient position, special equipment/implants available, medications/allergies/relevant history reviewed, required imaging and test results available.   Sterile Technique Maximal sterile technique including full sterile barrier drape, hand hygiene, sterile gown, sterile gloves, mask, hair covering, sterile ultrasound probe cover (if used).   Procedure Description Ultrasound used to identify appropriate peritoneal anatomy for placement and overlying skin marked.  Area of drainage cleaned and draped in sterile fashion. Lidocaine was used to anesthetize the skin and subcutaneous tissue.  2600 cc's of clear yellow appearing fluid was drained. Catheter then removed and bandaid applied to site.   Complications/Tolerance None; patient tolerated the procedure well.   EBL Minimal   Specimen(s) None Jeffrey Harris ACNP-BC [place] Pager # [phone] OR # [phone] if no answer

## 2020-06-08 NOTE — Progress Notes (Signed)
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 Marin Olp), gout who presents to [facility] emergency department with generalized weakness nausea and vomiting. therapeutic paracentesis 06/08/20    PT Comments    Pt eager to get OOB and able to ambulate in hallway today.  Pt denies any dyspnea or dizziness with mobility.  Pt ambulated without oxygen and SPO2 94% on room air upon returning to room.  Pt being set up for paracentesis upon returning to bed.  Pt pleased with progress today (with ambulation).   Follow Up Recommendations  Home health PT     Equipment Recommendations  None recommended by PT    Recommendations for Other Services       Precautions / Restrictions Precautions Precautions: Fall Precaution Comments: multiple lines    Mobility  Bed Mobility Overal bed mobility: Needs Assistance Bed Mobility: Supine to Sit;Sit to Supine     Supine to sit: Min guard Sit to supine: Min guard      Transfers Overall transfer level: Needs assistance Equipment used: None Transfers: Sit to/from Stand Sit to Stand: Min assist         General transfer comment: verbal cues for hand placement  Ambulation/Gait Ambulation/Gait assistance: Min assist Gait Distance (Feet): 160 Feet Assistive device: None Gait Pattern/deviations: Step-through pattern;Decreased stride length     General Gait Details: therapist pushed IV pole, pt declined RW and instead grazed hand rails in hallway (had pt use hand sanitizer upon return to room), slight assist for steadying at times, pt with poor eccentric control; SPO2 94% on room air however reapplied 1L O2 Nitro upon return to bed (about to have paracentesis)   Stairs              Wheelchair Mobility    Modified Rankin (Stroke Patients Only)       Balance                                            Cognition Arousal/Alertness: Awake/alert Behavior During Therapy: WFL for tasks assessed/performed Overall Cognitive Status: Within Functional Limits for tasks assessed                                        Exercises      General Comments        Pertinent Vitals/Pain Pain Assessment: Faces Faces Pain Scale: Hurts a little bit Pain Location: abdomen, back Pain Descriptors / Indicators: Aching;Grimacing;Discomfort Pain Intervention(s): Monitored during session;Repositioned    Home Living                      Prior Function            PT Goals (current goals can now be found in the care plan section) Progress towards PT goals: Progressing toward goals    Frequency    Min 3X/week      PT Plan Current plan remains appropriate    Co-evaluation              AM-PAC PT "6 Clicks" Mobility  Outcome Measure  Help needed turning from your back to your side while in a flat bed without using bedrails?: A Little Help needed moving from lying on your back to sitting on the side of a flat bed without using bedrails?: A Little Help needed moving to and from a bed to a chair (including a wheelchair)?: A Little Help needed standing up from a chair using your arms (e.g., wheelchair or bedside chair)?: A Little Help needed to walk in hospital room?: A Little Help needed climbing 3-5 steps with a railing? : A Lot 6 Click Score: 17    End of Session   Activity Tolerance: Patient limited by fatigue Patient left: in bed;with nursing/sitter in room;with call bell/phone within reach Nurse Communication: Mobility status PT Visit Diagnosis: Other abnormalities of gait and mobility (R26.89)     Time: 3664-4034 PT Time Calculation (min) (ACUTE ONLY): 19 min  Charges:  $Gait Training: 8-22  mins                     Arlyce Dice, DPT Acute Rehabilitation Services Pager: [phone] Office: [phone]  York Ram E 06/08/2020, 12:56 PM

## 2020-06-08 NOTE — Progress Notes (Signed)
eLink Physician-Brief Progress Note Patient Name: NOVAK STGERMAINE DOB: [birth year] MRN: [id number]   Date of Service  06/08/2020  HPI/Events of Note  Back pain - Patient NPO.   eICU Interventions  Plan: 1. Fentanyl 12.5-25 mcg IV Q 2 hours PRN pain.      Intervention Category Major Interventions: Other:  Lysle Dingwall 06/08/2020, 11:18 PM

## 2020-06-08 NOTE — Progress Notes (Signed)
PHARMACY - TOTAL PARENTERAL NUTRITION CONSULT NOTE   Indication: duodenal obstruction secondary to pancreatic cancer  Patient Measurements: Height: _0  (180.3 cm) Weight: 85.2 kg (187 lb 13.3 oz) IBW/kg (Calculated) : 75.3 TPN AdjBW (KG): 78 Body mass index is 26.2 kg/m. Usual Weight:   Assessment: 64 year old man with metastatic prostate cancer and peritoneal carcinomatosis, new obstructing duodenal pancreatic mass.  Undergoing chemotherapy.  He was admitted with shock and encephalopathy, suspected left aspiration pneumonia.  Undergoing radiation therapy since 10/4. Malignant ascites s/p paracentesis 10/2 NG attempted 7 times w/ no success IR does not plan to place G-tube - per IR PA and Dr. Vernard Gambles the risk of contamination of the peritoneal space is too high given the patients significant ascites. Per 10/5 C[facility] note "At this point the only team who has successfully accessed the patients stomach is GI during endoscopy -- it may be reasonable to ask them to place an NG for decompression vs PEG tube." Pharmacy consulted to start TPN 10/5 for nutrition support  Glucose / Insulin: no hx DM, on solucortef 50 q6> now off levophed, on sSSI q4h, got 10 units SSI/24hs, CBGs 121-215 Electrolytes: Lytes WNL  Renal: WNL LFTs / TGs: Alk phos rising 288 today, T bili down 1.7 today- not jaundiced so will get full trace elements Prealbumin / albumin: 9/29 & 10/1 prealbumin < 5; albumin 2.8 Intake / Output; MIVF: I/O + 855. Foley out; NS at 10 ml/hr GI Imaging: Surgeries / Procedures:   Central access: has PAC, PICC placed for TPN 10/5 TPN start date: 06/07/2020  Nutritional Goals (per RD recommendation on 10/5): kCal: 2400-2675, Protein: 120-135, Fluid: >/= 1.5 L/dau Goal TPN rate is 80 mL/hr (provides 134 g of protein and 2419 kcals per day)   Current Nutrition: TPN @ 40 ml/hr providing 922 kcal and 48 g protein NPO  Plan:  Increase TPN to goal rate of  80 mL/hr at 1800 to meet 100% of  nutrition goals, 15% AA to meet protein requirements  Continue with standard electrolytes Electrolytes in TPN: 648mq/L of Na, 538m/L of K, 48m31mL of Ca, 48mE58m of Mg, and 148mm548m of Phos. Cl:Ac ratio 1:1 Add standard MVI and trace elements to TPN continue Sensitive q4h SSI and adjust as needed  Monitor TPN labs on Mon/Thurs CMET, Mg, and Phos in AM  Ishana Blades D 06/08/2020,7:53 AM

## 2020-06-08 NOTE — Progress Notes (Signed)
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  Patient reported that for the past 4 weeks, he had been having bouts of nausea and vomiting.  Recently diagnosed with malignant ascites, undergone paracentesis 3 times since diagnosed on September 3. Patient last received Gemzar and Abraxane on 05/10/20.  Patient underwent EGD on 9/29 which revealed obstructing duodenal mass.  PCCM was consulted due to patient's post procedure hypotension.  He required pressors.  He required high levels of supplemental oxygen as well.  Pressor and oxygen requirements have slowly tapered down.  He remains on TPN.  New events last 24 hours / Subjective: Patient complains of being thirsty.  Feels constant pressure and bloating in his abdomen, asking for paracentesis.  Appears to have poor insight into his very poor prognosis.  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 IR without plans to place IR g-tube due to risk of peritoneal contamination in the setting of malignant ascites -GI/PCCM does not feel that patient could tolerate endoscopic placement of NG tube under sedation -Currently on TPN for nutrition, remains n.p.o. -PCCM planning for therapeutic paracentesis today -Per oncology.  Dr. Marin Olp will have further conversation with patient and wife for goals of care   Acute hypoxemic respiratory failure in setting of aspiration pneumonia -Now weaned down to 2 L nasal cannula O2 -Remains on IV cefepime  Septic shock secondary to aspiration pneumonia -Off pressor  -Wean solu-cortef   Pancytopenia -Stable, continue to monitor   DVT prophylaxis:  enoxaparin (LOVENOX) injection 40 mg Start: 05/30/20 1000  Code Status: Full Family Communication: None at bedside Disposition Plan:  Status is: Inpatient  Remains inpatient appropriate because:Inpatient level of care appropriate due to severity of illness   Dispo: The patient is from: Home              Anticipated d/c is to: Home              Anticipated d/c date is: > 3 days              Patient currently is not medically stable to d/c. Remains ill, paracentesis planned today. Further family conversation of goals of care by Dr. Marin Olp planned.  Remains on TPN      Consultants:   Oncology  PCCM  General Surgery  GI  Palliative care   IR    Antimicrobials:  Anti-infectives (From admission, onward)   Start     Dose/Rate Route Frequency Ordered Stop   06/02/20 0500  vancomycin (VANCOREADY) IVPB 1500 mg/300 mL  Status:  Discontinued        1,500 mg 150 mL/hr over 120 Minutes Intravenous STAT 06/01/20 1649 06/01/20 2051   06/02/20 0500  vancomycin (VANCOREADY) IVPB 1500 mg/300 mL   Status:  Discontinued        1,500 mg 150 mL/hr over 120 Minutes Intravenous Every 12 hours 06/01/20 2051 06/02/20 1333   06/01/20 1700  vancomycin (VANCOREADY) IVPB 1750 mg/350 mL        1,750 mg 175 mL/hr over 120 Minutes Intravenous STAT 06/01/20 1647 06/02/20 1745   06/01/20 1600  ceFEPIme (MAXIPIME) 2 g in sodium chloride 0.9 % 100 mL IVPB        2 g 200 mL/hr over 30 Minutes Intravenous Every 8 hours 06/01/20 1458 06/08/20 2359   05/30/20 1930  metroNIDAZOLE (FLAGYL) IVPB 500 mg  Status:  Discontinued        500 mg 100 mL/hr over 60 Minutes Intravenous Every 8 hours 05/30/20 1859 05/31/20 1103        Objective: Vitals:   06/08/20 1000 06/08/20 1100 06/08/20 1138 06/08/20 1145  BP: 98/67 106/64 93/72 101/70  Pulse: 80 68 73 78  Resp: 17 15 (!) 25 16  Temp:      TempSrc:      SpO2: 97% 99% 98% 100%  Weight:      Height:        Intake/Output Summary (Last 24 hours) at 06/08/2020 1158 Last data filed at 06/08/2020 0900 Gross per 24 hour  Intake 1056.84 ml  Output 101 ml  Net 955.84 ml   Filed Weights   06/07/20 0349 06/07/20 2214 06/08/20 0200  Weight: 87.1 kg 85.2 kg 85.2 kg    Examination:  General exam: Appears calm and comfortable, chronically ill appearing  Respiratory system: Respiratory effort normal. No respiratory distress. No conversational dyspnea.  Cardiovascular system: S1 & S2 heard, RRR. No murmurs. No pedal edema. Gastrointestinal system: Abdomen is soft and nontender Central nervous system: Alert and oriented. No focal neurological deficits. Speech clear.  Extremities: Symmetric in appearance  Skin: No rashes, lesions or ulcers on exposed skin  Psychiatry: Judgement and insight appear poor. Mood & affect appropriate.   Data Reviewed: I have personally reviewed following labs and imaging studies  CBC: Recent Labs  Lab 06/04/20 0400 06/05/20 0423 06/06/20 0245 06/07/20 0417 06/08/20 0404  WBC 11.2* 10.6* 8.3 2.9* 2.9*  NEUTROABS 10.0* 8.8*  6.2 2.0 2.2  HGB 10.1* 11.2* 11.3* 9.5* 10.4*  HCT 29.2* 32.4* 31.9* 27.7* 31.0*  MCV 98.0 97.9 97.3 98.2 98.7  PLT 79* 116* 115* 83* 99*   Basic Metabolic Panel: Recent Labs  Lab 06/02/20 0453 06/03/20 0335 06/04/20 0400 06/05/20 0423 06/06/20 0245 06/07/20 0417 06/08/20 0404  NA 128*   < > 133* 138 134* 136 135  K 3.9   < > 3.7 3.2* 3.3* 4.0 4.0  CL 91*   < > 102 106 102 103 103  CO2 20*   < > 22 21* 23 23 24   GLUCOSE 184*   < > 129* 105* 101* 127* 215*  BUN 15   < > 14 15 12 14 20   CREATININE 1.00   < > 0.48* 0.47* 0.45* 0.40* 0.41*  CALCIUM 7.0*   < > 7.3* 7.7*  7.5* 8.3* 8.2*  MG 2.2  --   --  1.8 1.8 1.9 2.0  PHOS  --   --   --  1.2* 2.2* 3.0 3.5   < > = values in this interval not displayed.   GFR: Estimated Creatinine Clearance: 103.3 mL/min (A) (by C-G formula based on SCr of 0.41 mg/dL (L)). Liver Function Tests: Recent Labs  Lab 06/04/20 0400 06/05/20 0423 06/06/20 0245 06/07/20 0417 06/08/20 0404  AST 32 39 35 34 30  ALT 21 23 22 21 22   ALKPHOS 140* 212* 215* 242* 288*  BILITOT 1.5* 1.9* 1.6* 2.3* 1.7*  PROT 4.9* 5.1* 5.2* 5.3* 5.4*  ALBUMIN 2.2* 2.4* 2.3* 3.1* 2.8*   No results for input(s): LIPASE, AMYLASE in the last 168 hours. No results for input(s): AMMONIA in the last 168 hours. Coagulation Profile: Recent Labs  Lab 06/01/20 1652 06/04/20 0802  INR 2.1* 2.2*   Cardiac Enzymes: No results for input(s): CKTOTAL, CKMB, CKMBINDEX, TROPONINI in the last 168 hours. BNP (last 3 results) No results for input(s): PROBNP in the last 8760 hours. HbA1C: No results for input(s): HGBA1C in the last 72 hours. CBG: Recent Labs  Lab 06/07/20 1609 06/07/20 2016 06/07/20 2335 06/08/20 0339 06/08/20 0832  GLUCAP 146* 152* 165* 209* 193*   Lipid Profile: Recent Labs    06/08/20 0404  TRIG 126   Thyroid Function Tests: No results for input(s): TSH, T4TOTAL, FREET4, T3FREE, THYROIDAB in the last 72 hours. Anemia Panel: No results for input(s):  VITAMINB12, FOLATE, FERRITIN, TIBC, IRON, RETICCTPCT in the last 72 hours. Sepsis Labs: Recent Labs  Lab 06/01/20 1652 06/01/20 2142 06/03/20 0335 06/04/20 0400 06/05/20 0423  PROCALCITON 2.80  --  23.32 11.49 3.94  LATICACIDVEN 5.6* 3.0* 1.6  --   --     Recent Results (from the past 240 hour(s))  Respiratory Panel by RT PCR (Flu A&B, Covid) - Nasopharyngeal Swab     Status: None   Collection Time: 05/30/20  1:40 AM   Specimen: Nasopharyngeal Swab  Result Value Ref Range Status   SARS Coronavirus 2 by RT PCR NEGATIVE NEGATIVE Final    Comment: (NOTE) SARS-CoV-2 target nucleic acids are NOT DETECTED.  The SARS-CoV-2 RNA is generally detectable in upper respiratoy specimens during the acute phase of infection. The lowest concentration of SARS-CoV-2 viral copies this assay can detect is 131 copies/mL. A negative result does not preclude SARS-Cov-2 infection and should not be used as the sole basis for treatment or other patient management decisions. A negative result may occur with  improper specimen collection/handling, submission of specimen other than nasopharyngeal swab, presence of viral mutation(s) within the areas targeted by this assay, and inadequate number of viral copies (<131 copies/mL). A negative result must be combined with clinical observations, patient history, and epidemiological information. The expected result is Negative.  Fact Sheet for Patients:  PinkCheek.be  Fact Sheet for Healthcare Providers:  GravelBags.it  This test is no t yet approved or cleared by the [place] FDA and  has been authorized for detection and/or diagnosis of SARS-CoV-2 by FDA under an Emergency Use Authorization (EUA). This EUA will remain  in effect (meaning this test can be used) for the duration of the COVID-19 declaration under Section 564(b)(1) of the Act, 21 U.S.C. section 360bbb-3(b)(1), unless the  authorization is terminated or revoked sooner.     Influenza A by PCR NEGATIVE NEGATIVE Final   Influenza B by PCR NEGATIVE NEGATIVE Final    Comment: (  NOTE) The Xpert Xpress SARS-CoV-2/FLU/RSV assay is intended as an aid in  the diagnosis of influenza from Nasopharyngeal swab specimens and  should not be used as a sole basis for treatment. Nasal washings and  aspirates are unacceptable for Xpert Xpress SARS-CoV-2/FLU/RSV  testing.  Fact Sheet for Patients: PinkCheek.be  Fact Sheet for Healthcare Providers: GravelBags.it  This test is not yet approved or cleared by the [place] FDA and  has been authorized for detection and/or diagnosis of SARS-CoV-2 by  FDA under an Emergency Use Authorization (EUA). This EUA will remain  in effect (meaning this test can be used) for the duration of the  Covid-19 declaration under Section 564(b)(1) of the Act, 21  U.S.C. section 360bbb-3(b)(1), unless the authorization is  terminated or revoked. Performed at [facility], [place] [address] [place], [place] [address]   Culture, sputum-assessment     Status: None   Collection Time: 06/01/20  3:34 PM   Specimen: Sputum  Result Value Ref Range Status   Specimen Description SPU  Final   Special Requests NONE  Final   Sputum evaluation   Final    THIS SPECIMEN IS ACCEPTABLE FOR SPUTUM CULTURE Performed at [facility], [place] [address], [place], [place] [address]    Report Status 06/01/2020 FINAL  Final  Culture, respiratory     Status: None   Collection Time: 06/01/20  3:34 PM   Specimen: Sputum  Result Value Ref Range Status   Specimen Description   Final    SPU Performed at [place] [address], [place], [place] [address]    Special Requests   Final    NONE Reflexed from [phone] Performed at [facility], [place] [address], [place], [place]  [address]    Gram Stain   Final    FEW WBC PRESENT,BOTH PMN AND MONONUCLEAR RARE GRAM NEGATIVE RODS MODERATE GRAM POSITIVE COCCI RARE YEAST    Culture   Final    FEW Normal respiratory flora-no Staph aureus or Pseudomonas seen Performed at [facility], [address], [place], [place] [address]    Report Status 06/03/2020 FINAL  Final  Culture, blood (Routine X 2) w Reflex to ID Panel     Status: None   Collection Time: 06/01/20  4:38 PM   Specimen: BLOOD  Result Value Ref Range Status   Specimen Description   Final    BLOOD LEFT ANTECUBITAL Performed at [facility] [address], [place], [place] [address]    Special Requests   Final    BOTTLES DRAWN AEROBIC ONLY Blood Culture adequate volume Performed at [place] [address], [place], [place] [address]    Culture   Final    NO GROWTH 5 DAYS Performed at [facility], [place] [address], [place], [place] [address]    Report Status 06/06/2020 FINAL  Final  Culture, blood (Routine X 2) w Reflex to ID Panel     Status: None   Collection Time: 06/01/20  4:38 PM   Specimen: BLOOD LEFT HAND  Result Value Ref Range Status   Specimen Description   Final    BLOOD LEFT HAND Performed at [place] [address], [place], [place] [address]    Special Requests   Final    BOTTLES DRAWN AEROBIC ONLY Blood Culture adequate volume Performed at [facility] [address] [place], [place] [address]  Culture   Final    NO GROWTH 5 DAYS Performed at [facility], [place] [address], [place], [place] [address]    Report Status 06/06/2020 FINAL  Final  MRSA PCR Screening     Status: None   Collection Time: 06/01/20  5:24 PM   Specimen: Nasal Mucosa; Nasopharyngeal  Result Value Ref Range Status   MRSA by PCR NEGATIVE NEGATIVE Final    Comment:        The GeneXpert MRSA Assay (FDA approved for NASAL specimens only), is one component  of a comprehensive MRSA colonization surveillance program. It is not intended to diagnose MRSA infection nor to guide or monitor treatment for MRSA infections. Performed at [facility], [place] [address] [place], [place] [address]   Urine culture     Status: None   Collection Time: 06/02/20 12:23 AM   Specimen: Urine  Result Value Ref Range Status   Specimen Description   Final    URINE, CATHETERIZED Performed at [facility] [address], [place], [place] [address]    Special Requests   Final    NONE Performed at [facility], [place] [address], [place], [place] [address]    Culture   Final    NO GROWTH Performed at [facility], [place] [address], [place], [place] [address]    Report Status 06/03/2020 FINAL  Final      Radiology Studies: [place] EKG SITE RITE  Result Date: 06/07/2020 If Site Rite image not attached, placement could not be confirmed due to current cardiac rhythm.     Scheduled Meds:  Chlorhexidine Gluconate Cloth  6 each Topical Daily   enoxaparin (LOVENOX) injection  40 mg Subcutaneous Q24H   hydrocortisone sod succinate (SOLU-CORTEF) inj  50 mg Intravenous Q6H   insulin aspart  0-9 Units Subcutaneous Q4H   ipratropium  0.5 mg Nebulization Q6H   levalbuterol  0.63 mg Nebulization Q6H   mouth rinse  15 mL Mouth Rinse BID   pantoprazole (PROTONIX) IV  40 mg Intravenous Q12H   sodium chloride flush  10-40 mL Intracatheter Q12H   Continuous Infusions:  sodium chloride Stopped (06/07/20 0946)   sodium chloride 10 mL/hr at 06/08/20 0900   ceFEPime (MAXIPIME) IV Stopped (06/08/20 0827)   chlorproMAZINE (THORAZINE) IV Stopped (06/02/20 0309)   TPN ADULT (ION) 40 mL/hr at 06/08/20 0900   TPN ADULT (ION)       LOS: 9 days      Time spent: 50 minutes   Dessa Phi, DO [facility] 06/08/2020, 11:58 AM   Available via Epic secure chat 7am-7pm After these hours,  please refer to coverage provider listed on [email or web address]

## 2020-06-09 ENCOUNTER — Encounter: Payer: Self-pay | Admitting: Radiation Oncology

## 2020-06-09 ENCOUNTER — Ambulatory Visit
Admit: 2020-06-09 | Discharge: 2020-06-09 | Disposition: A | Discharge status: Home / Self Care | Payer: BC Managed Care – PPO | Attending: Radiation Oncology | Admitting: Radiation Oncology

## 2020-06-09 ENCOUNTER — Inpatient Hospital Stay (HOSPITAL_COMMUNITY): Payer: BC Managed Care – PPO

## 2020-06-09 ENCOUNTER — Encounter: Payer: Self-pay | Admitting: Interventional Radiology

## 2020-06-09 DIAGNOSIS — R6520 Severe sepsis without septic shock: Secondary | ICD-10-CM | POA: Diagnosis not present

## 2020-06-09 DIAGNOSIS — C259 Malignant neoplasm of pancreas, unspecified: Secondary | ICD-10-CM | POA: Diagnosis not present

## 2020-06-09 DIAGNOSIS — K311 Adult hypertrophic pyloric stenosis: Secondary | ICD-10-CM

## 2020-06-09 DIAGNOSIS — A419 Sepsis, unspecified organism: Secondary | ICD-10-CM | POA: Diagnosis not present

## 2020-06-09 DIAGNOSIS — Z7189 Other specified counseling: Secondary | ICD-10-CM | POA: Diagnosis not present

## 2020-06-09 HISTORY — PX: IR PERC TUN PERIT CATH WO PORT S&I /IMAG: IMG2327

## 2020-06-09 HISTORY — PX: IR GASTROSTOMY TUBE MOD SED: IMG625

## 2020-06-09 LAB — COMPREHENSIVE METABOLIC PANEL
ALT: 22 U/L (ref 0–44)
AST: 31 U/L (ref 15–41)
Albumin: 2.7 g/dL — ABNORMAL LOW (ref 3.5–5.0)
Alkaline Phosphatase: 250 U/L — ABNORMAL HIGH (ref 38–126)
Anion gap: 7 (ref 5–15)
BUN: 22 mg/dL (ref 8–23)
CO2: 24 mmol/L (ref 22–32)
Calcium: 8.4 mg/dL — ABNORMAL LOW (ref 8.9–10.3)
Chloride: 106 mmol/L (ref 98–111)
Creatinine, Ser: 0.4 mg/dL — ABNORMAL LOW (ref 0.61–1.24)
GFR calc non Af Amer: >60 mL/min (ref >60)
Glucose, Bld: 223 mg/dL — ABNORMAL HIGH (ref 70–99)
Potassium: 3.6 mmol/L (ref 3.5–5.1)
Sodium: 137 mmol/L (ref 135–145)
Total Bilirubin: 1 mg/dL (ref 0.3–1.2)
Total Protein: 5.2 g/dL — ABNORMAL LOW (ref 6.5–8.1)

## 2020-06-09 LAB — CBC WITH DIFFERENTIAL/PLATELET
Abs Immature Granulocytes: 0.07 10*3/uL (ref 0.00–0.07)
Basophils Absolute: 0 10*3/uL (ref 0.0–0.1)
Basophils Relative: 0 %
Eosinophils Absolute: 0 10*3/uL (ref 0.0–0.5)
Eosinophils Relative: 1 %
HCT: 30 % — ABNORMAL LOW (ref 39.0–52.0)
Hemoglobin: 10.5 g/dL — ABNORMAL LOW (ref 13.0–17.0)
Immature Granulocytes: 2 %
Lymphocytes Relative: 16 %
Lymphs Abs: 0.7 10*3/uL (ref 0.7–4.0)
MCH: 34.7 pg — ABNORMAL HIGH (ref 26.0–34.0)
MCHC: 35 g/dL (ref 30.0–36.0)
MCV: 99 fL (ref 80.0–100.0)
Monocytes Absolute: 0.4 10*3/uL (ref 0.1–1.0)
Monocytes Relative: 9 %
Neutro Abs: 3.1 10*3/uL (ref 1.7–7.7)
Neutrophils Relative %: 72 %
Platelets: 106 10*3/uL — ABNORMAL LOW (ref 150–400)
RBC: 3.03 MIL/uL — ABNORMAL LOW (ref 4.22–5.81)
RDW: 20.6 % — ABNORMAL HIGH (ref 11.5–15.5)
WBC: 4.1 10*3/uL (ref 4.0–10.5)
nRBC: 0 % (ref 0.0–0.2)

## 2020-06-09 LAB — GLUCOSE, CAPILLARY
Glucose-Capillary: 175 mg/dL — ABNORMAL HIGH (ref 70–99)
Glucose-Capillary: 203 mg/dL — ABNORMAL HIGH (ref 70–99)
Glucose-Capillary: 208 mg/dL — ABNORMAL HIGH (ref 70–99)
Glucose-Capillary: 245 mg/dL — ABNORMAL HIGH (ref 70–99)
Glucose-Capillary: 260 mg/dL — ABNORMAL HIGH (ref 70–99)
Glucose-Capillary: 266 mg/dL — ABNORMAL HIGH (ref 70–99)

## 2020-06-09 LAB — PROTIME-INR
INR: 1.2 (ref 0.8–1.2)
Prothrombin Time: 15.1 seconds (ref 11.4–15.2)

## 2020-06-09 LAB — MAGNESIUM: Magnesium: 1.9 mg/dL (ref 1.7–2.4)

## 2020-06-09 LAB — PHOSPHORUS: Phosphorus: 3.3 mg/dL (ref 2.5–4.6)

## 2020-06-09 MED ORDER — LIDOCAINE HCL (PF) 1 % IJ SOLN
INTRAMUSCULAR | Status: AC | PRN
  Administered 2020-06-09: 10 mL via INTRADERMAL

## 2020-06-09 MED ORDER — MIDAZOLAM HCL 2 MG/2ML IJ SOLN
INTRAMUSCULAR | Status: AC
  Filled 2020-06-09: qty 2

## 2020-06-09 MED ORDER — CEFAZOLIN SODIUM-DEXTROSE 2-4 GM/100ML-% IV SOLN
INTRAVENOUS | Status: AC
  Filled 2020-06-09: qty 100

## 2020-06-09 MED ORDER — FLUMAZENIL 0.5 MG/5ML IV SOLN
INTRAVENOUS | Status: AC
  Filled 2020-06-09: qty 5

## 2020-06-09 MED ORDER — DEXTROSE 10 % IV SOLN: INTRAVENOUS | Status: DC

## 2020-06-09 MED ORDER — LEVALBUTEROL HCL 0.63 MG/3ML IN NEBU
0.6300 mg | INHALATION_SOLUTION | Freq: Two times a day (BID) | RESPIRATORY_TRACT | Status: DC
  Filled 2020-06-09: qty 3

## 2020-06-09 MED ORDER — INSULIN ASPART 100 UNIT/ML ~~LOC~~ SOLN
0.0000 [IU] | SUBCUTANEOUS | Status: DC
  Administered 2020-06-09: 3 [IU] via SUBCUTANEOUS
  Administered 2020-06-10 (×2): 2 [IU] via SUBCUTANEOUS

## 2020-06-09 MED ORDER — FENTANYL CITRATE (PF) 100 MCG/2ML IJ SOLN
INTRAMUSCULAR | Status: AC
Start: 2020-06-09 — End: 2020-06-10
  Filled 2020-06-09: qty 2

## 2020-06-09 MED ORDER — ENOXAPARIN SODIUM 40 MG/0.4ML ~~LOC~~ SOLN: 40.0000 mg | SUBCUTANEOUS | Status: DC

## 2020-06-09 MED ORDER — MAGNESIUM SULFATE IN D5W 1-5 GM/100ML-% IV SOLN
1.0000 g | Freq: Once | INTRAVENOUS | Status: AC
  Administered 2020-06-09: 1 g via INTRAVENOUS
  Filled 2020-06-09: qty 100

## 2020-06-09 MED ORDER — POTASSIUM PHOSPHATES 15 MMOLE/5ML IV SOLN
20.0000 mmol | Freq: Once | INTRAVENOUS | Status: AC
  Administered 2020-06-09: 20 mmol via INTRAVENOUS
  Filled 2020-06-09: qty 6.67

## 2020-06-09 MED ORDER — CEFAZOLIN SODIUM-DEXTROSE 2-4 GM/100ML-% IV SOLN
2.0000 g | INTRAVENOUS | Status: AC
  Administered 2020-06-09: 2 g via INTRAVENOUS
  Filled 2020-06-09: qty 100

## 2020-06-09 MED ORDER — NALOXONE HCL 0.4 MG/ML IJ SOLN
INTRAMUSCULAR | Status: AC
  Filled 2020-06-09: qty 1

## 2020-06-09 MED ORDER — TRACE MINERALS CU-MN-SE-ZN 300-55-60-3000 MCG/ML IV SOLN
INTRAVENOUS | Status: DC
  Filled 2020-06-09: qty 896

## 2020-06-09 MED ORDER — FENTANYL CITRATE (PF) 100 MCG/2ML IJ SOLN
INTRAMUSCULAR | Status: AC | PRN
  Administered 2020-06-09 (×2): 50 ug via INTRAVENOUS

## 2020-06-09 MED ORDER — LEVALBUTEROL HCL 0.63 MG/3ML IN NEBU: 0.6300 mg | INHALATION_SOLUTION | Freq: Four times a day (QID) | RESPIRATORY_TRACT | Status: DC | PRN

## 2020-06-09 MED ORDER — GLUCAGON HCL RDNA (DIAGNOSTIC) 1 MG IJ SOLR
INTRAMUSCULAR | Status: AC
  Filled 2020-06-09: qty 1

## 2020-06-09 MED ORDER — IPRATROPIUM BROMIDE 0.02 % IN SOLN
0.5000 mg | Freq: Two times a day (BID) | RESPIRATORY_TRACT | Status: DC
  Filled 2020-06-09: qty 2.5

## 2020-06-09 MED ORDER — LIDOCAINE HCL 1 % IJ SOLN
INTRAMUSCULAR | Status: AC
  Filled 2020-06-09: qty 20

## 2020-06-09 MED ORDER — MIDAZOLAM HCL 2 MG/2ML IJ SOLN
INTRAMUSCULAR | Status: AC | PRN
  Administered 2020-06-09: 0.5 mg via INTRAVENOUS
  Administered 2020-06-09 (×2): 1 mg via INTRAVENOUS

## 2020-06-09 NOTE — Progress Notes (Signed)
Referring Physician(s): Ennever,P  Supervising Physician: Jacqulynn Cadet  Patient Status:  [facility] - In-pt  Chief Complaint: abdominal pain/distension/ascites, pancreatic cancer  Subjective: Patient familiar to IR service from internal/external biliary drain placement 07/26/2019, Port-A-Cath placement on 08/06/2019, biliary stent placement on 08/27/2019, removal of biliary drain on 09/01/2019 as well as multiple paracenteses, latest on 06/04/2020.  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  He underwent EGD on 9/21 which revealed an obstructing duodenal mass.  Critical care was consulted due to patient's postprocedure hypotension which required pressors as well as high levels of supplemental O2.  Pressor and oxygen requirements have slowly tapered down since.  He remains on TPN.  Plans are underway to DC patient home with hospice care.  In the interim oncology service has requested gastrostomy tube and tunneled peritoneal drain placements prior to discharge.  He is currently afebrile, WBC normal, hemoglobin 10.5, platelets 106k, creatinine 0.4, total bilirubin 1, PT 15.1, INR 1.2.  Patient currently denies fever, headache, chest pain, worsening dyspnea, cough, back pain, nausea, vomiting or bleeding.  He does have some diffuse abdominal cramping/ slight distention. He did undergo paracentesis by critical care service yesterday yielding 2.6 L of fluid.   Past Medical History:  Diagnosis Date  . Goals of care, counseling/discussion 07/29/2019  . Gout   . Hyperbilirubinemia 07/2019  . Pancreatic cancer metastasized to liver [facility]) 07/29/2019   Past Surgical History:  Procedure Laterality Date  . BIOPSY  07/25/2019   Procedure: BIOPSY;  Surgeon: Ronnette Juniper, MD;  Location: [facility] ENDOSCOPY;  Service: Gastroenterology;;  . ESOPHAGOGASTRODUODENOSCOPY  07/25/2019   Procedure: ESOPHAGOGASTRODUODENOSCOPY (EGD);  Surgeon: Ronnette Juniper, MD;   Location: [facility] ENDOSCOPY;  Service: Gastroenterology;;  . ESOPHAGOGASTRODUODENOSCOPY (EGD) WITH PROPOFOL N/A 06/01/2020   Procedure: ESOPHAGOGASTRODUODENOSCOPY (EGD) WITH PROPOFOL;  Surgeon: Ronnette Juniper, MD;  Location: [facility] ENDOSCOPY;  Service: Gastroenterology;  Laterality: N/A;  . IR BILIARY STENT(S) EXISTING ACCESS INC DILATION CATH EXCHANGE  08/27/2019  . IR CHOLANGIOGRAM EXISTING TUBE  09/01/2019  . IR CONVERT BILIARY DRAIN TO INT EXT BILIARY DRAIN  08/27/2019  . IR IMAGING GUIDED PORT INSERTION  08/06/2019  . IR INT EXT BILIARY DRAIN WITH CHOLANGIOGRAM  07/26/2019  . IR PARACENTESIS  05/16/2020  . IR RADIOLOGIST EVAL & MGMT  08/05/2019  . IR REMOVAL BILIARY DRAIN  09/01/2019  . KNEE ARTHROSCOPY       Allergies: Patient has no known allergies.  Medications: Prior to Admission medications   Medication Sig Start Date End Date Taking? Authorizing Provider  diphenoxylate-atropine (LOMOTIL) 2.5-0.025 MG tablet Take 1-2 tablets by mouth 4 (four) times daily as needed for diarrhea or loose stools. 03/09/20  Yes Volanda Napoleon, MD  gabapentin (NEURONTIN) 300 MG capsule Take 1 capsule (300 mg total) by mouth 3 (three) times daily. 05/10/20  Yes Ennever, Rudell Cobb, MD  hydrocortisone (ANUSOL-HC) 25 MG suppository PLACE 1 SUPPOSITORY (25 MG TOTAL) RECTALLY 2 (TWO) TIMES DAILY. FOR 7 DAYS 12/16/19  Yes Volanda Napoleon, MD  KLOR-CON M20 20 MEQ tablet TAKE 2 TABLETS BY MOUTH DAILY 04/23/20  Yes Volanda Napoleon, MD  lactulose [facility]) 10 GM/15ML solution SMARTSIG:40 Milliliter(s) By Mouth Morning-Evening 05/10/20  Yes Ennever, Rudell Cobb, MD  lipase/protease/amylase (CREON) 36000 UNITS CPEP capsule Take 2 capsules (72,000 Units total) by mouth 3 (three) times daily before meals. 09/14/19  Yes Ennever, Rudell Cobb, MD  ondansetron (ZOFRAN) 8 MG tablet Take 1 tablet (8 mg total) by mouth every 8 (  eight) hours as needed for nausea or vomiting. 05/17/20  Yes Ennever, Rudell Cobb, MD  oxyCODONE-acetaminophen (PERCOCET/ROXICET)  5-325 MG tablet Take 1 tablet by mouth every 6 (six) hours as needed for severe pain.   Yes [provider]  pantoprazole (PROTONIX) 40 MG tablet TAKE 1 TABLET BY MOUTH TWICE A DAY 01/15/20  Yes Ennever, Rudell Cobb, MD  tamsulosin (FLOMAX) 0.4 MG CAPS capsule Take 0.4 mg by mouth daily. 12/24/19  Yes [provider]  zolpidem (AMBIEN) 5 MG tablet Take 1 tablet (5 mg total) by mouth at bedtime as needed for sleep. 08/17/19  Yes Volanda Napoleon, MD  allopurinol (ZYLOPRIM) 300 MG tablet Take 300 mg by mouth daily. Patient not taking: Reported on 05/30/2020 03/28/20   [provider]  linaclotide Rolan Lipa) 145 MCG CAPS capsule Take 1 capsule (145 mcg total) by mouth daily before breakfast. 12/22/19   Volanda Napoleon, MD  prochlorperazine (COMPAZINE) 10 MG tablet Take 1 tablet (10 mg total) by mouth every 6 (six) hours as needed (Nausea or vomiting). 08/07/19 04/13/20  Volanda Napoleon, MD     Vital Signs: BP 102/69 (BP Location: Right Arm)   Pulse 77   Temp (!) 97.4 F (36.3 C) (Axillary)   Resp (!) 25   Ht 5\' 11"  (1.803 m)   Wt 182 lb 1.6 oz (82.6 kg)   SpO2 91%   BMI 25.40 kg/m   Physical Exam awake, alert.  Chest with diminished breath sounds bases.  Clean, intact right chest wall Port-A-Cath.  Heart with regular rate and rhythm.  Abdomen soft, mildly distended, some mild diffuse tenderness to palpation.  Extremities without significant edema.  Imaging: DG CHEST PORT 1 VIEW  Result Date: 06/06/2020 CLINICAL DATA:  Multifocal airspace opacity EXAM: PORTABLE CHEST 1 VIEW COMPARISON:  June 05, 2020 FINDINGS: Port-A-Cath tip is at the cavoatrial junction. No pneumothorax. There is extensive airspace opacity throughout the left lung, overall slightly increased. There is a small pleural effusion on each side. There is slight right base atelectasis. Right lung otherwise is clear. Heart is mildly enlarged, stable, with pulmonary vascularity normal. No adenopathy. No bone  lesions. IMPRESSION: Widespread airspace opacity throughout the left lung, overall slightly increased. Mild right base atelectasis. Small pleural effusions bilaterally. Stable cardiac silhouette. Port-A-Cath tip at cavoatrial junction. Electronically Signed   By: Lowella Grip III M.D.   On: 06/06/2020 09:42   DG Abd Portable 1V  Result Date: 06/06/2020 CLINICAL DATA:  64 year old male with pancreatic cancer, gastric distension. EXAM: PORTABLE ABDOMEN - 1 VIEW COMPARISON:  Abdominal film 06/04/2019. CT Abdomen and Pelvis 06/02/2020. FINDINGS: Portable AP supine view at 0826 hours. There is moderate to severe gastric distention with air. There is abrupt termination of air at the level of the distal stomach or duodenal bulb. A metal biliary stent is redemonstrated projecting over the mid abdomen and distended stomach. There is a paucity of bowel gas elsewhere throughout the abdomen and pelvis. There is a small volume of gas in the normal rectosigmoid colon. No acute osseous abnormality identified. IMPRESSION: 1. 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 2. Decompressed small and large bowel. Electronically Signed   By: Genevie Ann M.D.   On: 06/06/2020 08:44   [place] EKG SITE RITE  Result Date: 06/07/2020 If Site Rite image not attached, placement could not be confirmed due to current cardiac rhythm.   Labs:  CBC: Recent Labs    06/06/20 0245 06/07/20  4332 06/08/20 0404 06/09/20 0416  WBC 8.3 2.9* 2.9* 4.1  HGB 11.3* 9.5* 10.4* 10.5*  HCT 31.9* 27.7* 31.0* 30.0*  PLT 115* 83* 99* 106*    COAGS: Recent Labs    07/26/19 0250 08/06/19 1015 06/01/20 1652 06/04/20 0802  INR 1.2 1.2 2.1* 2.2*  APTT  --   --  44*  --     BMP: Recent Labs    06/04/20 0400 06/04/20 0400 06/05/20 0423 06/05/20 0423 06/06/20 0245 06/07/20 0417 06/08/20 0404 06/09/20 0416  NA 133*   < > 138   < > 134* 136 135 137  K 3.7   < > 3.2*   < >  3.3* 4.0 4.0 3.6  CL 102   < > 106   < > 102 103 103 106  CO2 22   < > 21*   < > 23 23 24 24   GLUCOSE 129*   < > 105*   < > 101* 127* 215* 223*  BUN 14   < > 15   < > 12 14 20 22   CALCIUM 7.3*   < > 7.7*   < > 7.5* 8.3* 8.2* 8.4*  CREATININE 0.48*   < > 0.47*   < > 0.45* 0.40* 0.41* 0.40*  GFRNONAA >60   < > >60   < > >60 >60 >60 >60  GFRAA >60  --  >60  --  >60 >60  --   --    < > = values in this interval not displayed.    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  He underwent EGD on 9/21 which revealed an obstructing duodenal mass.  Critical care was consulted due to patient's postprocedure hypotension which required pressors as well as high levels of supplemental O2. Pressor and oxygen requirements have slowly tapered down since. He remains on TPN.  Plans are underway to DC patient home with hospice care.  In the interim oncology service has requested gastrostomy tube and tunneled peritoneal drain placements prior to discharge.  Case has been reviewed by Dr. Laurence Ferrari and discussed with Dr. Marin Olp.  Plans at this time are to proceed with gastrostomy tube placement along with tunneled peritoneal drain placement to assist with recurrent ascites removal and minimize/mitigate leakage of ascites from G-tube insertion site tract.  T tacks will also be left in place at G-tube site.  Details/risks of procedures, including but not limited to, internal bleeding, infection/peritonitis, injury to adjacent structures discussed with patient with his understanding and consent.  Procedures planned for later today.   Electronically Signed: D. Rowe Daquavion,  PA-C 06/09/2020, 10:02 AM   I spent a total of 30 minutes at the the patient's bedside AND on the patient's hospital floor or unit, greater than 50% of which was counseling/coordinating care for percutaneous gastrostomy tube and tunneled peritoneal drain placements    Patient ID: Jeffrey Harris, male   DOB: 17-Nov-1955, 64 y.o.   MRN: [id number]

## 2020-06-09 NOTE — Progress Notes (Signed)
MEDICATION-RELATED CONSULT NOTE   IR Procedure Consult - Anticoagulant/Antiplatelet PTA/Inpatient Med List Review by Pharmacist    Procedure: placement of tunneled peritoneal drain and G-tube    Completed: 10/7 @ 1540  Post-Procedural bleeding risk per IR MD assessment: STANDARD  Antithrombotic medications on inpatient profile prior to procedure:  Lovenox PTA antithrombotic medications:  none    Recommended restart time per IR Post-Procedure Guidelines:  Day+1 (next AM)   Other considerations:  No complications noted; Hgb stable; Plt improved   Plan:     Resume Lovenox 40 mg SQ daily starting tomorrow AM  Reuel Boom, PharmD, BCPS [phone] 06/09/2020, 5:35 PM

## 2020-06-09 NOTE — Sedation Documentation (Signed)
Peritoneal drain placement complete.

## 2020-06-09 NOTE — Progress Notes (Signed)
Patient transported to Interventional Radiology on telemetry via bed. Patient AOx4, on Room Air, VSS. Reported off to IR staff.

## 2020-06-09 NOTE — Progress Notes (Signed)
Manufacturing engineer [facility]) Hospital Liaison Note:  Spoke to patient/family with Dr. Micheline Rough of PMT at bedside to discuss patient goals of care, explain hospice philosophy and end of life comfort care.  After discussion and clarification of his wishes, patient confirmed he would like to go home tomorrow with support of hospice services once medically stable.. Patient and family were offered support, given patient [facility] contact information and encouraged to call hospice with any questions/concerns as needed.     Currently, patient has a walker in the home and would like [facility] to order a hospital bed, bedside commode and a wheelchair.    Please send completed DNR and arrange for any comfort scripts that may be needed for symptom management so there is no lapse in patient comfort prior to hospice services beginning.  Thank you for the opportunity to participate in this patient's care,   Gar Ponto, RN [place] (in [place]) [phone]

## 2020-06-09 NOTE — Sedation Documentation (Signed)
Gastrostomy tube placement procedure begun. 

## 2020-06-09 NOTE — Progress Notes (Signed)
[name] had a little bit of a restless night.  I am sure some of this was from our talk last evening with his wife and sister.  The bed is also uncomfortable.  He has been having more abdominal discomfort.  Again I suspect that he is developing intestinal dysfunction.  My listen to his abdomen this morning, I cannot hear any bowel sounds.  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  We are trying to get him home tomorrow.  We will try to see if a G-tube can be placed to help with the stomach drainage.  He has radiation going.  He will get a treatment today.  His labs look about the same.  Albumin 2.7.  His calcium 8.4.  Blood sugar 223.  White cell count 4.1 with a hemoglobin of 10.5 and a platelet count 106,000.  Our goal is comfort care.  We want Hospice to see him when he gets home.  I am not sure if we will be able to get him home on Friday.  We will try.  I will try him on a Duragesic patch.  I think this abdominal pain is going to be chronic.  I just want him to have comfort.  I know that he is gotten outstanding care from all the staff in the ICU.  I know he is very complementary of the care that he has received.  Again, our goal is to try to get him home tomorrow.  I know we have a lot to try to accomplish before then.  Lattie Haw, MD  1 [place]onians 4:16

## 2020-06-09 NOTE — Progress Notes (Signed)
Upon inspection of TNA bag before hanging for infusion. This nurse noted small break in bag along bag seam leaking TNA. Reported leak in bag to pharmacist. He stated "they would send a bag of D10 to unit for the night, and TPN bag would be hung tomorrow." This nurse also called and notified patient's nurse. Fran Lowes, RN [facility]AST

## 2020-06-09 NOTE — Progress Notes (Addendum)
IV RN called to inform Pharmacy that TPN bag leaking. Will hang D10 at 80 ml/hr (intended TPN rate) until new TPN can be hung tomorrow at 1800. With CBGs continually elevated, and insulin from TPN no longer available, will increase SSI to moderate-scale, continuing q4h CBGs.  Reuel Boom, PharmD, BCPS [phone] 06/09/2020, 6:05 PM

## 2020-06-09 NOTE — Progress Notes (Signed)
PHARMACY - TOTAL PARENTERAL NUTRITION CONSULT NOTE   Indication: duodenal obstruction secondary to pancreatic cancer  Patient Measurements: Height: 5' 11"  (180.3 cm) Weight: 82.6 kg (182 lb 1.6 oz) IBW/kg (Calculated) : 75.3 TPN AdjBW (KG): 78 Body mass index is 25.4 kg/m. Usual Weight:   Assessment: 64 year old man with metastatic prostate cancer and peritoneal carcinomatosis, new obstructing duodenal pancreatic mass.  Undergoing chemotherapy.  He was admitted with shock and encephalopathy, suspected left aspiration pneumonia.  Undergoing radiation therapy since 10/4. Malignant ascites s/p paracentesis 10/2 NG attempted 7 times w/ no success IR does not plan to place G-tube - per IR PA and Dr. Vernard Gambles the risk of contamination of the peritoneal space is too high given the patients significant ascites. Per 10/5 CCS note "At this point the only team who has successfully accessed the patients stomach is GI during endoscopy -- it may be reasonable to ask them to place an NG for decompression vs PEG tube." Pharmacy consulted to start TPN 10/5 for nutrition support  Glucose / Insulin: no hx DM, on solucortef 50 q6> now off levophed, on sSSI q4h, got 10 units SSI/24hs, CBGs  192-251, 17 units SSI Electrolytes: Lytes WNL (K 3.6, Mg 1.9, Phos 3.3, cCa 9.4) Renal: WNL LFTs / TGs: Alk phos down some 250, T bili down wnl, TGs wnl (10/6) Prealbumin / albumin: prealbumin 6 (10/6); albumin 2.7 Intake / Output; MIVF: I/O +1706.4. Foley out; NS at 10 ml/hr GI Imaging: Surgeries / Procedures:   Central access: has PAC, PICC placed for TPN 10/5 TPN start date: 06/07/2020  Nutritional Goals (per RD recommendation on 10/5): kCal: 2400-2675, Protein: 120-135, Fluid: >/= 1.5 L/dau Goal TPN rate is 80 mL/hr (provides 134 g of protein and 2419 kcals per day)   Current Nutrition: TPN @ 80 ml/hr providing 2419 kcal and 134 g protein NPO  Plan:  Now: Mg sulfate 1 g iv once, Kphos 20 mmol iv  once  Discussed plan to continue TPN despite goals of care with focus on comfort, continue for now per TRH Continue TPN at goal rate of  80 mL/hr meeting 100% of nutrition goals, 15% AA to meet protein requirements  Continue with standard electrolytes Electrolytes in TPN: 52mq/L of Na, 563m/L of K, 2m31mL of Ca, 2mE87m of Mg, and 12mm82m of Phos. Cl:Ac ratio 1:1 Add standard MVI and trace elements to TPN continue Sensitive q4h SSI and adjust as needed  Add 8 units regular insulin to TPN Monitor TPN labs on Mon/Thurs BMET, Mg, and Phos in AM  ChrisMount Ephraimtt D 06/09/2020,8:14 AM

## 2020-06-09 NOTE — Progress Notes (Signed)
Palliative care progress note  Reason for visit: Goals of care/symptom management  Chart reviewed including personal review of pertinent labs and imaging.  I met today with Mr. Johanning in conjunction with Sherry Gibson from [facility] collective.  His wife and sister were at the bedside as well.  We reviewed conversation that he had with Dr. Ennever and goal of being at home with a focus on feeling as well as possible and spending time with family.  Discussed hospice benefits and what plan of care would look like under hospice services.  We reviewed that TPN/IV fluids are not something that would be covered under hospice benefits and will be stopped at discharge from the hospital.  We also discussed radiation therapy and that this is not generally something is covered by hospice as well.  At that point, Mr. Boivin expressed that he had previously discussed plan to transition home but also continue with radiation therapy at [place].  We discussed that there may be exceptions for palliative radiotherapy under hospice but this would need to be explored further if he is not at a point where he is ready to forego further radiation.  His sister then expressed that she remembers Dr. Ennever originally talking about radiation, but he then also said that he feels that Mr. 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  His family question if Dr. Ennever may be able to have input regarding plan to forego further radiation.  I stepped out of the room and paged Dr. Ennever and he was kind enough to call back for us to discuss care plan moving forward.  Following discussion with Dr. 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  Following this conversation, he and family were in agreement with plan to pursue transition home with hospice services.  On discharge, would recommend scripts for: - Morphine Concentrate 10mg/0.5ml: 5mg (0.25ml) sublingual every 1 hour as needed for pain or shortness of breath: Disp 30ml - Lorazepam 2mg/ml concentrated solution: 1mg (0.5ml) sublingual every 4 hours as needed for anxiety: Disp 30ml - Haldol 2mg/ml solution: 0.5mg (0.25ml) sublingual every 4 hours as needed for agitation or nausea: Disp 30ml  -Atropine 1% ophthalmic solution: 3 drops sublingual every 4 hours as needed for excess secretions: Disp 2 mL  Following administration of these, family should clamp tube for 30 minutes to allow medications to absorb.  If this is ineffective to control symptoms with further titration as needed, hospice can certainly address this further at home.  Start time 1600 End time 1645 Total time: 45 minutes Greater than 50%  of this time was spent counseling and coordinating care related to the above assessment and plan.   , MD [place] Palliative Medicine Team [phone]    

## 2020-06-09 NOTE — Procedures (Signed)
Interventional Radiology Procedure Note  Procedure:  1.) Tunneled peritoneal drain 2.) G-tube placed  Complications: None  Estimated Blood Loss: None  Recommendations: - Return to floor - G-tube to LWS x 3 hrs - Peritoneal drainage can begin tomorrow as well   Signed,  Criselda Peaches, MD

## 2020-06-09 NOTE — TOC Progression Note (Signed)
Transition of Care [facility]) - Progression Note    Patient Details  Name: TYSHON FANNING MRN: [id number] Date of Birth: [birth year]  [facility] CM/SW Contact  Leeroy Cha, RN Phone Number: 06/09/2020, 9:47 AM  Clinical Narrative:    [facility] called via phone message left with patient information and my call back number.   Expected Discharge Plan: [place] Barriers to Discharge: Barriers Unresolved (comment) (continued shock and treatment)  Expected Discharge Plan and Services Expected Discharge Plan: [place]   Discharge Planning Services: CM Consult   Living arrangements for the past 2 months: Single Family Home                           HH Arranged: RN, NIV [place]: [place] ([place]) Date [place]: 06/07/20 Time [place]: 1128 Representative spoke with at [place]: [place] ([place]) Interventions    Readmission Risk Interventions No flowsheet data found.

## 2020-06-09 NOTE — Progress Notes (Deleted)
IV RN called to inform Pharmacy that TPN bag leaking. Will hang D10 at 80 ml/hr (intended TPN rate) until TPN can be compounded tomorrow.  Reuel Boom, PharmD, BCPS [phone] 06/09/2020, 6:03 PM

## 2020-06-09 NOTE — Progress Notes (Signed)
Noted discussions with patient and Dr. Marin Olp. General surgery will be available if needed. If IR planning to place peritoneal drainage catheter, hopefully they could also place gastrostomy tube since ascites would be controlled. I think that this would be least invasive for the patient and allow palliation of obstructive symptoms. If this is not an option then please let us know, so we can plan for surgical gastrostomy. Patient is very hopeful to go home Friday if possible and be with his family. I discussed this with patient this morning as well.   Norm Parcel , [facility] 06/09/2020, 8:42 AM Please see Amion for pager number during day hours 7:00am-4:30pm

## 2020-06-09 NOTE — Progress Notes (Signed)
Manufacturing engineer [facility]) Hospital Liaison Note:  [facility] received a request for bed at [place] from [facility].  Unfortunately, there are no beds available this morning at [facility] but will update TOC/family tomorrow or earlier if one becomes available.    Spoke with patient spouse Beverlee Nims to confirm interest who stated that she is unsure whether they want IPU bed or hospice services in the home stating that family/patient need more time to discuss and make a decision.  Set up a meeting in patient room at 4:00pm today to explain services and answer questions.    Thank you for this referral and the chance to participate in this patient's care,  Gar Ponto, RN [place] [facility] (on [place]) [phone]

## 2020-06-09 NOTE — Progress Notes (Signed)
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  Patient reported that for the past 4 weeks, he had been having bouts of nausea and vomiting.  Recently diagnosed with malignant ascites, undergone paracentesis 3 times since diagnosed on September 3. Patient last received Gemzar and Abraxane on 05/10/20.  Patient underwent EGD on 9/29 which revealed obstructing duodenal mass.  PCCM was consulted due to patient's post procedure hypotension.  He required pressors.  He required high levels of supplemental oxygen as well.  Pressor and oxygen requirements have slowly tapered down.  He remains on TPN.  New events last 24 hours / Subjective: Reviewed Dr. Antonieta Pert progress notes. Patient planning to transition to comfort care - will have gastrostomy tube and tunneled peritoneal drain placement per IR today. Hopeful discharge home with home hospice would be patient's preference, states that he wants to leave on Friday.   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)   Septic shock (HCC)   Acute respiratory failure with hypoxia (HCC)   Aspiration pneumonia due to gastric secretions [facility])   Palliative care by specialist   General weakness   Constipation   Hypophosphatemia   Nausea   Metastatic pancreatic cancer to  the liver with obstructing duodenal mass, malignant ascites -Appreciate GI, general surgery, IR, oncology, palliative care  -S/p EGD 06/01/20 revealed a large obstructing duodenal mass -S/p radiation to see whether tumor burden causing obstruction can be shrunk -S/p paracentesis 10/6  -Currently on TPN for nutrition, remains n.p.o. -Transitioning to palliative/hospice care on discharge. Planning for gastrostomy tube placement and tunneled peritoneal drain placement today by IR -Home hospice vs residential hospice on discharge   Acute hypoxemic respiratory failure in setting of aspiration pneumonia -Now weaned down to room air  -Completed antibiotic course   Septic shock secondary to aspiration pneumonia -Off pressor  -Wean solu-cortef   Pancytopenia -Stable, continue to monitor   DVT prophylaxis: SCD  Code Status: DNR  Family Communication: None at bedside Disposition Plan:  Status is: Inpatient  Remains inpatient appropriate because:Inpatient level of care appropriate due to severity of illness   Dispo: The patient is from: Home              Anticipated d/c is to: Home hospice vs residential hospice               Anticipated d/c date is: 1 day              Patient currently is not medically stable to d/c. Planning for gastrostomy tube placement and tunneled peritoneal drain placement today by IR      Consultants:   Oncology  PCCM  General Surgery   GI  Palliative care   IR    Antimicrobials:  Anti-infectives (From admission, onward)   Start     Dose/Rate Route Frequency Ordered Stop  06/02/20 0500  vancomycin (VANCOREADY) IVPB 1500 mg/300 mL  Status:  Discontinued        1,500 mg 150 mL/hr over 120 Minutes Intravenous STAT 06/01/20 1649 06/01/20 2051   06/02/20 0500  vancomycin (VANCOREADY) IVPB 1500 mg/300 mL  Status:  Discontinued        1,500 mg 150 mL/hr over 120 Minutes Intravenous Every 12 hours 06/01/20 2051 06/02/20 1333   06/01/20 1700   vancomycin (VANCOREADY) IVPB 1750 mg/350 mL        1,750 mg 175 mL/hr over 120 Minutes Intravenous STAT 06/01/20 1647 06/02/20 1745   06/01/20 1600  ceFEPIme (MAXIPIME) 2 g in sodium chloride 0.9 % 100 mL IVPB        2 g 200 mL/hr over 30 Minutes Intravenous Every 8 hours 06/01/20 1458 06/08/20 1556   05/30/20 1930  metroNIDAZOLE (FLAGYL) IVPB 500 mg  Status:  Discontinued        500 mg 100 mL/hr over 60 Minutes Intravenous Every 8 hours 05/30/20 1859 05/31/20 1103       Objective: Vitals:   06/09/20 0600 06/09/20 0800 06/09/20 0809 06/09/20 0844  BP:    102/69  Pulse: (!) 114   77  Resp: (!) 34   (!) 25  Temp:  (!) 97.4 F (36.3 C)    TempSrc:  Axillary    SpO2: 100%  90% 91%  Weight:      Height:        Intake/Output Summary (Last 24 hours) at 06/09/2020 1038 Last data filed at 06/09/2020 0900 Gross per 24 hour  Intake 2095.56 ml  Output --  Net 2095.56 ml   Filed Weights   06/07/20 2214 06/08/20 0200 06/09/20 0429  Weight: 85.2 kg 85.2 kg 82.6 kg    Examination: General exam: Appears calm and comfortable  Respiratory system: Clear to auscultation. Respiratory effort normal. On room air.  Cardiovascular system: S1 & S2 heard, RRR. No pedal edema. Gastrointestinal system: Abdomen is mildly distended, soft  Central nervous system: Alert and oriented. Non focal exam. Speech clear  Extremities: Symmetric in appearance bilaterally  Skin: No rashes, lesions or ulcers on exposed skin  Psychiatry: Judgement and insight appear stable. Mood & affect appropriate.     Data Reviewed: I have personally reviewed following labs and imaging studies  CBC: Recent Labs  Lab 06/05/20 0423 06/06/20 0245 06/07/20 0417 06/08/20 0404 06/09/20 0416  WBC 10.6* 8.3 2.9* 2.9* 4.1  NEUTROABS 8.8* 6.2 2.0 2.2 3.1  HGB 11.2* 11.3* 9.5* 10.4* 10.5*  HCT 32.4* 31.9* 27.7* 31.0* 30.0*  MCV 97.9 97.3 98.2 98.7 99.0  PLT 116* 115* 83* 99* 417*   Basic Metabolic Panel: Recent Labs   Lab 06/05/20 0423 06/06/20 0245 06/07/20 0417 06/08/20 0404 06/09/20 0416  NA 138 134* 136 135 137  K 3.2* 3.3* 4.0 4.0 3.6  CL 106 102 103 103 106  CO2 21* 23 23 24 24   GLUCOSE 105* 101* 127* 215* 223*  BUN 15 12 14 20 22   CREATININE 0.47* 0.45* 0.40* 0.41* 0.40*  CALCIUM 7.7* 7.5* 8.3* 8.2* 8.4*  MG 1.8 1.8 1.9 2.0 1.9  PHOS 1.2* 2.2* 3.0 3.5 3.3   GFR: Estimated Creatinine Clearance: 103.3 mL/min (A) (by C-G formula based on SCr of 0.4 mg/dL (L)). Liver Function Tests: Recent Labs  Lab 06/05/20 0423 06/06/20 0245 06/07/20 0417 06/08/20 0404 06/09/20 0416  AST 39 35 34 30 31  ALT 23 22 21 22 22   ALKPHOS 212* 215* 242* 288*  250*  BILITOT 1.9* 1.6* 2.3* 1.7* 1.0  PROT 5.1* 5.2* 5.3* 5.4* 5.2*  ALBUMIN 2.4* 2.3* 3.1* 2.8* 2.7*   No results for input(s): LIPASE, AMYLASE in the last 168 hours. No results for input(s): AMMONIA in the last 168 hours. Coagulation Profile: Recent Labs  Lab 06/04/20 0802 06/09/20 0903  INR 2.2* 1.2   Cardiac Enzymes: No results for input(s): CKTOTAL, CKMB, CKMBINDEX, TROPONINI in the last 168 hours. BNP (last 3 results) No results for input(s): PROBNP in the last 8760 hours. HbA1C: No results for input(s): HGBA1C in the last 72 hours. CBG: Recent Labs  Lab 06/08/20 1540 06/08/20 1959 06/09/20 0011 06/09/20 0302 06/09/20 0807  GLUCAP 192* 251* 245* 203* 208*   Lipid Profile: Recent Labs    06/08/20 0404  TRIG 126   Thyroid Function Tests: No results for input(s): TSH, T4TOTAL, FREET4, T3FREE, THYROIDAB in the last 72 hours. Anemia Panel: No results for input(s): VITAMINB12, FOLATE, FERRITIN, TIBC, IRON, RETICCTPCT in the last 72 hours. Sepsis Labs: Recent Labs  Lab 06/03/20 0335 06/04/20 0400 06/05/20 0423  PROCALCITON 23.32 11.49 3.94  LATICACIDVEN 1.6  --   --     Recent Results (from the past 240 hour(s))  Culture, sputum-assessment     Status: None   Collection Time: 06/01/20  3:34 PM   Specimen: Sputum   Result Value Ref Range Status   Specimen Description SPU  Final   Special Requests NONE  Final   Sputum evaluation   Final    THIS SPECIMEN IS ACCEPTABLE FOR SPUTUM CULTURE Performed at [facility], [place] [address] [place], [place] [address]    Report Status 06/01/2020 FINAL  Final  Culture, respiratory     Status: None   Collection Time: 06/01/20  3:34 PM   Specimen: Sputum  Result Value Ref Range Status   Specimen Description   Final    SPU Performed at [place] [address], [place], [place] [address]    Special Requests   Final    NONE Reflexed from [id number] Performed at [facility], [place] [address] [place], [place] [address]    Gram Stain   Final    FEW WBC PRESENT,BOTH PMN AND MONONUCLEAR RARE GRAM NEGATIVE RODS MODERATE GRAM POSITIVE COCCI RARE YEAST    Culture   Final    FEW Normal respiratory flora-no Staph aureus or Pseudomonas seen Performed at [facility], [address], [place], [place] [address]    Report Status 06/03/2020 FINAL  Final  Culture, blood (Routine X 2) w Reflex to ID Panel     Status: None   Collection Time: 06/01/20  4:38 PM   Specimen: BLOOD  Result Value Ref Range Status   Specimen Description   Final    BLOOD LEFT ANTECUBITAL Performed at [place] [address], [place], [place] [address]    Special Requests   Final    BOTTLES DRAWN AEROBIC ONLY Blood Culture adequate volume Performed at [place] [address] [place], [place] [address]    Culture   Final    NO GROWTH 5 DAYS Performed at [facility], [place] [address], [place],  [address]    Report Status 06/06/2020 FINAL  Final  Culture, blood (Routine X 2) w Reflex to ID Panel     Status: None   Collection Time: 06/01/20  4:38 PM   Specimen: BLOOD LEFT HAND  Result Value  Ref Range Status   Specimen Description   Final    BLOOD LEFT HAND Performed  at [place] [address], [place], [place] [address]    Special Requests   Final    BOTTLES DRAWN AEROBIC ONLY Blood Culture adequate volume Performed at [place] [address] [place], [place] [address]    Culture   Final    NO GROWTH 5 DAYS Performed at [facility], [place] [address] [place], [place] [address]    Report Status 06/06/2020 FINAL  Final  MRSA PCR Screening     Status: None   Collection Time: 06/01/20  5:24 PM   Specimen: Nasal Mucosa; Nasopharyngeal  Result Value Ref Range Status   MRSA by PCR NEGATIVE NEGATIVE Final    Comment:        The GeneXpert MRSA Assay (FDA approved for NASAL specimens only), is one component of a comprehensive MRSA colonization surveillance program. It is not intended to diagnose MRSA infection nor to guide or monitor treatment for MRSA infections. Performed at [facility], [place] [address] [place], [place] [address]   Urine culture     Status: None   Collection Time: 06/02/20 12:23 AM   Specimen: Urine  Result Value Ref Range Status   Specimen Description   Final    URINE, CATHETERIZED Performed at [place] [address] [place], [place] [address]    Special Requests   Final    NONE Performed at [facility], [place] [address], [place],  [address]    Culture   Final    NO GROWTH Performed at [facility], [place] [address], [place],  [address]    Report Status 06/03/2020 FINAL  Final      Radiology Studies: No results found.    Scheduled Meds: . Chlorhexidine Gluconate Cloth  6 each Topical Daily  . hydrocortisone sod succinate (SOLU-CORTEF) inj  50 mg Intravenous Daily  . insulin aspart  0-9 Units Subcutaneous Q4H  . ipratropium  0.5 mg Nebulization Q6H  . levalbuterol  0.63 mg Nebulization Q6H  . mouth rinse  15 mL Mouth Rinse BID  . pantoprazole (PROTONIX) IV  40 mg Intravenous  Q12H  . sodium chloride flush  10-40 mL Intracatheter Q12H   Continuous Infusions: . sodium chloride Stopped (06/07/20 0946)  . sodium chloride Stopped (06/09/20 0834)  . chlorproMAZINE (THORAZINE) IV Stopped (06/02/20 0309)  . potassium PHOSPHATE IVPB (in mmol) 85 mL/hr at 06/09/20 0900  . TPN ADULT (ION) 80 mL/hr at 06/09/20 0900  . TPN ADULT (ION)       LOS: 10 days      Time spent: 40 minutes   Dessa Phi, DO [facility] 06/09/2020, 10:38 AM   Available via Epic secure chat 7am-7pm After these hours, please refer to coverage provider listed on [email or web address]

## 2020-06-10 ENCOUNTER — Ambulatory Visit: Payer: BC Managed Care – PPO

## 2020-06-10 DIAGNOSIS — R6520 Severe sepsis without septic shock: Secondary | ICD-10-CM | POA: Diagnosis not present

## 2020-06-10 DIAGNOSIS — A419 Sepsis, unspecified organism: Secondary | ICD-10-CM | POA: Diagnosis not present

## 2020-06-10 LAB — CBC WITH DIFFERENTIAL/PLATELET
Abs Immature Granulocytes: 0.16 10*3/uL — ABNORMAL HIGH (ref 0.00–0.07)
Basophils Absolute: 0 10*3/uL (ref 0.0–0.1)
Basophils Relative: 0 %
Eosinophils Absolute: 0 10*3/uL (ref 0.0–0.5)
Eosinophils Relative: 1 %
HCT: 32.1 % — ABNORMAL LOW (ref 39.0–52.0)
Hemoglobin: 11 g/dL — ABNORMAL LOW (ref 13.0–17.0)
Immature Granulocytes: 3 %
Lymphocytes Relative: 12 %
Lymphs Abs: 0.8 10*3/uL (ref 0.7–4.0)
MCH: 33.7 pg (ref 26.0–34.0)
MCHC: 34.3 g/dL (ref 30.0–36.0)
MCV: 98.5 fL (ref 80.0–100.0)
Monocytes Absolute: 0.5 10*3/uL (ref 0.1–1.0)
Monocytes Relative: 8 %
Neutro Abs: 4.7 10*3/uL (ref 1.7–7.7)
Neutrophils Relative %: 76 %
Platelets: 107 10*3/uL — ABNORMAL LOW (ref 150–400)
RBC: 3.26 MIL/uL — ABNORMAL LOW (ref 4.22–5.81)
RDW: 20.6 % — ABNORMAL HIGH (ref 11.5–15.5)
WBC: 6.2 10*3/uL (ref 4.0–10.5)
nRBC: 0.5 % — ABNORMAL HIGH (ref 0.0–0.2)

## 2020-06-10 LAB — MAGNESIUM: Magnesium: 1.7 mg/dL (ref 1.7–2.4)

## 2020-06-10 LAB — BASIC METABOLIC PANEL
Anion gap: 8 (ref 5–15)
BUN: 19 mg/dL (ref 8–23)
CO2: 24 mmol/L (ref 22–32)
Calcium: 8 mg/dL — ABNORMAL LOW (ref 8.9–10.3)
Chloride: 102 mmol/L (ref 98–111)
Creatinine, Ser: 0.34 mg/dL — ABNORMAL LOW (ref 0.61–1.24)
GFR calc non Af Amer: >60 mL/min (ref >60)
Glucose, Bld: 123 mg/dL — ABNORMAL HIGH (ref 70–99)
Potassium: 3.6 mmol/L (ref 3.5–5.1)
Sodium: 134 mmol/L — ABNORMAL LOW (ref 135–145)

## 2020-06-10 LAB — GLUCOSE, CAPILLARY
Glucose-Capillary: 126 mg/dL — ABNORMAL HIGH (ref 70–99)
Glucose-Capillary: 123 mg/dL — ABNORMAL HIGH (ref 70–99)
Glucose-Capillary: 130 mg/dL — ABNORMAL HIGH (ref 70–99)

## 2020-06-10 LAB — PHOSPHORUS: Phosphorus: 3.9 mg/dL (ref 2.5–4.6)

## 2020-06-10 MED ORDER — ATROPINE SULFATE 1 % OP SOLN: 3.0000 [drp] | OPHTHALMIC | 0 refills | Status: AC | PRN

## 2020-06-10 MED ORDER — HALOPERIDOL LACTATE 2 MG/ML PO CONC: 0.6000 mg | ORAL | 0 refills | Status: AC | PRN

## 2020-06-10 MED ORDER — KATE FARMS STANDARD 1.4 EN LIQD: 68.0000 mL/h | Freq: Three times a day (TID) | ENTERAL | 0 refills | Status: AC

## 2020-06-10 MED ORDER — PROSOURCE TF PO LIQD: 45.0000 mL | Freq: Three times a day (TID) | ORAL | 99 refills | Status: AC

## 2020-06-10 MED ORDER — LORAZEPAM 2 MG/ML PO CONC: 1.0000 mg | ORAL | 0 refills | Status: AC | PRN

## 2020-06-10 MED ORDER — FENTANYL 25 MCG/HR TD PT72
1.0000 | MEDICATED_PATCH | TRANSDERMAL | Status: DC
  Administered 2020-06-10: 1 via TRANSDERMAL
  Filled 2020-06-10: qty 1

## 2020-06-10 MED ORDER — HEPARIN SOD (PORK) LOCK FLUSH 100 UNIT/ML IV SOLN
500.0000 [IU] | INTRAVENOUS | Status: AC | PRN
  Administered 2020-06-10: 500 [IU]
  Filled 2020-06-10: qty 5

## 2020-06-10 MED ORDER — MORPHINE SULFATE (CONCENTRATE) 10 MG /0.5 ML PO SOLN
5.0000 mg | ORAL | 0 refills | Status: AC | PRN
Start: 2020-06-10 — End: ?

## 2020-06-10 NOTE — Progress Notes (Signed)
I am absolutely impressed by the fact that radiology was able to put in the G-tube and the peritoneal drainage catheter yesterday.  I am so happy that we can now get Mr. Snapp home.  He is ready to go home.  He wants to go home.  I will put a Duragesic patch on him.  I think this will be helpful for discomfort that he may have.  I would like to believe that he can have liquids now.  At least he can have the taste of liquids.  This will improve his quality of life.  The radiation therapy stopped.  This is appropriate.  Again, I do still see that his intestines are really going to function all that well.  Whenever I listen to his intestines, I hear very little bowel sounds.  I know this is a huge transition for him.  He certainly understands what is going on.  Again he wants to be home.  Family is coming down from up [place] to be with him.  I know hospice has been called already.  They should be ready for him when he does go home.  I am not sure if he needs a teaching for the tubes.  I would think that the peritoneal drainage catheter would be most important so that he can drain ascites as it builds up.  I know that he is tried all that he can do.  He is given [place] everything that his body can give.  This is just the "dark side" of what pancreatic cancer does.  I know my nurses in the office will be quite disappointed not been able to see him again.  They really liked Mr. Tomaro.  He was always so outgoing and so chatty with them.  He was always talking with other patients.  He certainly did his best to try to help others who he thought were less fortunate than he was.  I know that I will see him again.  I very much appreciate the outstanding care that he got from all the staff down the ICU.  The staff did a wonderful job with him given the complexity of the problems.  Lattie Haw, MD  Proverbs 19:17

## 2020-06-10 NOTE — Discharge Summary (Addendum)
Physician Discharge Summary  Jeffrey Harris GTX:[id number] DOB: 1955-10-04 DOA: 05/29/2020  PCP: [name], MD  Admit date: 05/29/2020 Discharge date: 06/10/2020  Admitted From: Home Disposition:  Home with home hospice    Diet: [name] 1.4 @ 10 ml/hr to advance by 10 ml every 24 hours to reach goal rate of 68 ml/hr with 45 ml Prosource TF (or equivalent) TID and 100 ml free water QID.   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  Patient reported that for the past 4 weeks, he had been having bouts of nausea and vomiting.  Recently diagnosed with malignant ascites, undergone paracentesis 3 times since diagnosed on September 3. Patient last received Gemzar and Abraxane on 05/10/20.  Patient underwent EGD on 9/29 which revealed obstructing duodenal mass.  PCCM was consulted due to patient's post procedure hypotension.  He required pressors.  He required high levels of supplemental oxygen as well.  Pressor and oxygen requirements have slowly tapered down.  He remained on TPN. After further evaluation and conversation with oncology Dr. Marin Olp, patient and wife decided to transition to home hospice for quality of life. He was weaned off O2 to room air without distress. G-tube and peritoneal drain were placed for palliation. He was discharged home with home hospice.   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)   Septic shock (HCC)   Acute respiratory failure with hypoxia (HCC)   Aspiration pneumonia due to gastric secretions [facility])   Palliative care by specialist   General weakness   Constipation   Hypophosphatemia   Nausea   Metastatic pancreatic cancer to the liver with obstructing duodenal mass, malignant ascites -Appreciate GI, general surgery, IR, oncology, palliative care  -S/p EGD 06/01/20 revealed a large obstructing duodenal mass -S/p radiation to see whether tumor burden causing obstruction can be shrunk -S/p paracentesis 10/6  -S/p gastrostomy tube placement and tunneled peritoneal drain placement 10/7 -Home hospice discharge    Acute hypoxemic respiratory failure in setting of aspiration pneumonia -Now weaned down to room air  -Completed antibiotic course   Septic shock secondary to aspiration pneumonia -Off pressor and solucortef   Pancytopenia -Stable, continue to monitor    Discharge Instructions  Discharge Instructions    Ambulatory Pleural Drainage Schedule   Complete by: As directed    Drain daily, up to max of 1L until patient is only able to drain out 125ml. If <13ml for 3 consecutive drains every other day, then call Interventional Radiology ([phone]) for evaluation and possible removal.   Increase activity slowly   Complete by: As directed    No wound care   Complete by: As directed      Allergies as of 06/10/2020   No Known Allergies     Medication List    STOP taking these medications   allopurinol 300 MG tablet Commonly known as: ZYLOPRIM   diphenoxylate-atropine 2.5-0.025 MG tablet Commonly known as: LOMOTIL   gabapentin 300  MG capsule Commonly known as: NEURONTIN   hydrocortisone 25 MG suppository Commonly known as: ANUSOL-HC   Klor-Con M20 20 MEQ tablet Generic drug: potassium chloride SA   lactulose 10 GM/15ML solution Commonly known as: CHRONULAC   linaclotide 145 MCG Caps  capsule Commonly known as: Linzess   lipase/protease/amylase 36000 UNITS Cpep capsule Commonly known as: Creon   ondansetron 8 MG tablet Commonly known as: Zofran   oxyCODONE-acetaminophen 5-325 MG tablet Commonly known as: PERCOCET/ROXICET   pantoprazole 40 MG tablet Commonly known as: PROTONIX   tamsulosin 0.4 MG Caps capsule Commonly known as: FLOMAX   zolpidem 5 MG tablet Commonly known as: AMBIEN     TAKE these medications   atropine 1 % ophthalmic solution Place 3 drops under the tongue every 4 (four) hours as needed (excess secretions).   haloperidol 2 MG/ML solution Commonly known as: HALDOL Place 0.3 mLs (0.6 mg total) under the tongue every 4 (four) hours as needed for agitation.   [name] Standard 1.4 Liqd 68 mL/hr by Enteral route in the morning, at noon, and at bedtime. 84ml/hr to advance by 70ml every 24 hours to reach goal rate of 67ml/hr   feeding supplement (PROSource TF) liquid Place 45 mLs into feeding tube 3 (three) times daily.   LORazepam 2 MG/ML concentrated solution Commonly known as: LORazepam Intensol Place 0.5 mLs (1 mg total) under the tongue every 4 (four) hours as needed for anxiety.   morphine CONCENTRATE 10 mg / 0.5 ml concentrated solution Place 0.25 mLs (5 mg total) under the tongue every hour as needed for severe pain or shortness of breath.       No Known Allergies  Consultations:  Oncology  PCCM  General Surgery   GI  Palliative care   IR    Procedures/Studies: CT ABDOMEN PELVIS WO CONTRAST  Result Date: 06/02/2020 CLINICAL DATA:  History of pancreatic cancer with liver metastasis and ascites. Vomiting and weakness. Multiple recent paracentesis. Patient is deteriorating. To evaluate for perforation. EXAM: CT CHEST, ABDOMEN AND PELVIS WITHOUT CONTRAST TECHNIQUE: Multidetector CT imaging of the chest, abdomen and pelvis was performed following the standard protocol without IV contrast. COMPARISON:  CT abdomen and  pelvis 05/30/2020.  CT chest 05/03/2020 FINDINGS: CT CHEST FINDINGS Cardiovascular: Normal heart size. No pericardial effusions. Normal caliber thoracic aorta. Scattered aortic and coronary artery calcifications. Right central venous catheter with tip in the low SVC. Mediastinum/Nodes: Esophagus is decompressed but fluid-filled, possibly indicating reflux or dysmotility. Pretracheal lymph node measures about 1.5 cm diameter. A paraesophageal lymph node at the abdominal hiatus measures about 1.9 cm diameter. Lymph nodes are similar in size to previous studies. Heterogeneous appearance of the thyroid gland with scattered calcifications. No significant nodularity. Lungs/Pleura: There is dense consolidation and airspace disease throughout the left lung and in the right lung base. Air bronchograms are present. Small pleural effusions. Changes could represent pneumonia or possibly aspiration. No pneumothorax. Musculoskeletal: Degenerative changes in the spine. No destructive bone lesions. CT ABDOMEN PELVIS FINDINGS Hepatobiliary: The gallbladder is distended with thickened wall. A biliary stent is present without bile duct dilatation. Pancreas: Mass in the head of the pancreas without interval enlargement. Porta hepatis and celiac axis lymph nodes are enlarged, likely metastatic. No pancreatic ductal dilatation. Spleen: Normal in size without focal abnormality. Adrenals/Urinary Tract: 1.7 cm diameter left adrenal gland nodule. Additional 1.3 cm left adrenal gland nodule. No change in appearance. Low-attenuation suggests fat containing adenomas. Pelvic location of the right kidney. Kidneys appear otherwise symmetrical. No  hydronephrosis. No stones. Bladder is unremarkable. Stomach/Bowel: Stomach, small bowel, and colon are not abnormally distended. Contrast material fills the colon. No free air or free contrast to suggest bowel perforation. Vascular/Lymphatic: Calcification of the aorta. No aneurysm. Retroperitoneal lymph  nodes in the upper abdomen are mildly enlarged, similar prior study, likely metastatic. Reproductive: Prostate gland is enlarged. Other: Diffuse free fluid throughout the abdomen and pelvis, likely ascites. No free air. Edema in the subcutaneous fat. Musculoskeletal: No destructive bone lesions. IMPRESSION: 1. Dense consolidation and airspace disease throughout the left lung and in the right lung base. Small pleural effusions. Changes likely to represent pneumonia or possibly aspiration. 2. Fluid-filled esophagus, possibly indicating reflux or dysmotility. 3. Mass in the head of the pancreas without interval enlargement. 4. Porta hepatis, celiac axis, and retroperitoneal lymph nodes are enlarged, likely metastatic. 5. Diffuse free fluid throughout the abdomen and pelvis, likely ascites. 6. Bilateral adrenal gland nodules, likely metastatic. 7. Pelvic location of the right kidney. 8. Enlarged prostate gland. 9. Aortic atherosclerosis. 10. No evidence of bowel obstruction or perforation. Aortic Atherosclerosis (ICD10-I70.0). Electronically Signed   By: Lucienne Capers M.D.   On: 06/02/2020 02:45   CT ABDOMEN PELVIS WO CONTRAST  Result Date: 05/30/2020 CLINICAL DATA:  Nausea and vomiting. History of pancreatic cancer. EXAM: CT ABDOMEN AND PELVIS WITHOUT CONTRAST TECHNIQUE: Multidetector CT imaging of the abdomen and pelvis was performed following the standard protocol without IV contrast. COMPARISON:  May 03, 2020 FINDINGS: Lower chest: Mild atelectasis and/or infiltrate is seen along the inferior aspect of the left upper lobe. Hepatobiliary: There is a mild amount of perihepatic fluid. This is decreased in severity when compared to the prior exam. A few tiny foci of air are seen within the right lobe of the liver (axial CT images 16 through 19, CT series number 2). This is decreased in severity when compared to the prior study. A tiny focus of air is also seen within the lumen of a mildly distended  gallbladder. This is also decreased in severity. No gallstones, gallbladder wall thickening, or biliary dilatation. Pancreas: A common bile duct stent is seen within the head of the pancreas. Persistent enlargement of the pancreatic head is seen. It should be noted that this area, and surrounding lymph nodes, are limited in evaluation in the absence of intravenous contrast. Spleen: A mild amount of perisplenic fluid is seen. Spleen is otherwise normal in appearance. Adrenals/Urinary Tract: The right adrenal gland is normal in appearance. Stable 2.1 cm x 1.5 cm and 1.3 cm x 1.0 cm low-attenuation left adrenal masses are seen. 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 Kidneys are normal in size, without renal calculi, focal lesion, or hydronephrosis. The urinary bladder is partially contracted and otherwise normal in appearance. Stomach/Bowel: Stomach is within normal limits. Appendix appears normal. No evidence of bowel wall thickening, distention, or inflammatory changes. Vascular/Lymphatic: There is moderate severity calcification of the abdominal aorta and bilateral common iliac arteries, without evidence of aneurysmal dilatation. No enlarged abdominal or pelvic lymph nodes. Reproductive: Prostate is unremarkable. Other: No abdominal wall hernia or abnormality. Mild diffuse nonspecific mesenteric inflammatory fat stranding is seen throughout the abdomen. A mild to moderate amount of pelvic free fluid is seen. This is stable in appearance. Musculoskeletal: No acute or significant osseous findings. IMPRESSION: 1. Persistent enlargement of the pancreatic head with a distal common bile duct stent in place. 2. Interval decrease in severity of tiny foci of air within the right lobe of the liver and gallbladder. 3.  Mild amount of perihepatic and perisplenic fluid, decreased in severity when compared to the prior exam. 4. Stable low-attenuation left adrenal masses which may represent adrenal adenomas. 5. Mild to moderate amount  of pelvic free fluid, stable. 6. Mild left upper lobe atelectasis and/or infiltrate. 7. Diffuse mesenteric inflammatory fat stranding secondary to diffuse mesenteritis cannot be excluded. 8. Aortic atherosclerosis. Aortic Atherosclerosis (ICD10-I70.0). Electronically Signed   By: Virgina Norfolk M.D.   On: 05/30/2020 16:07   CT CHEST WO CONTRAST  Result Date: 06/02/2020 CLINICAL DATA:  History of pancreatic cancer with liver metastasis and ascites. Vomiting and weakness. Multiple recent paracentesis. Patient is deteriorating. To evaluate for perforation. EXAM: CT CHEST, ABDOMEN AND PELVIS WITHOUT CONTRAST TECHNIQUE: Multidetector CT imaging of the chest, abdomen and pelvis was performed following the standard protocol without IV contrast. COMPARISON:  CT abdomen and pelvis 05/30/2020.  CT chest 05/03/2020 FINDINGS: CT CHEST FINDINGS Cardiovascular: Normal heart size. No pericardial effusions. Normal caliber thoracic aorta. Scattered aortic and coronary artery calcifications. Right central venous catheter with tip in the low SVC. Mediastinum/Nodes: Esophagus is decompressed but fluid-filled, possibly indicating reflux or dysmotility. Pretracheal lymph node measures about 1.5 cm diameter. A paraesophageal lymph node at the abdominal hiatus measures about 1.9 cm diameter. Lymph nodes are similar in size to previous studies. Heterogeneous appearance of the thyroid gland with scattered calcifications. No significant nodularity. Lungs/Pleura: There is dense consolidation and airspace disease throughout the left lung and in the right lung base. Air bronchograms are present. Small pleural effusions. Changes could represent pneumonia or possibly aspiration. No pneumothorax. Musculoskeletal: Degenerative changes in the spine. No destructive bone lesions. CT ABDOMEN PELVIS FINDINGS Hepatobiliary: The gallbladder is distended with thickened wall. A biliary stent is present without bile duct dilatation. Pancreas: Mass in  the head of the pancreas without interval enlargement. Porta hepatis and celiac axis lymph nodes are enlarged, likely metastatic. No pancreatic ductal dilatation. Spleen: Normal in size without focal abnormality. Adrenals/Urinary Tract: 1.7 cm diameter left adrenal gland nodule. Additional 1.3 cm left adrenal gland nodule. No change in appearance. Low-attenuation suggests fat containing adenomas. Pelvic location of the right kidney. Kidneys appear otherwise symmetrical. No hydronephrosis. No stones. Bladder is unremarkable. Stomach/Bowel: Stomach, small bowel, and colon are not abnormally distended. Contrast material fills the colon. No free air or free contrast to suggest bowel perforation. Vascular/Lymphatic: Calcification of the aorta. No aneurysm. Retroperitoneal lymph nodes in the upper abdomen are mildly enlarged, similar prior study, likely metastatic. Reproductive: Prostate gland is enlarged. Other: Diffuse free fluid throughout the abdomen and pelvis, likely ascites. No free air. Edema in the subcutaneous fat. Musculoskeletal: No destructive bone lesions. IMPRESSION: 1. Dense consolidation and airspace disease throughout the left lung and in the right lung base. Small pleural effusions. Changes likely to represent pneumonia or possibly aspiration. 2. Fluid-filled esophagus, possibly indicating reflux or dysmotility. 3. Mass in the head of the pancreas without interval enlargement. 4. Porta hepatis, celiac axis, and retroperitoneal lymph nodes are enlarged, likely metastatic. 5. Diffuse free fluid throughout the abdomen and pelvis, likely ascites. 6. Bilateral adrenal gland nodules, likely metastatic. 7. Pelvic location of the right kidney. 8. Enlarged prostate gland. 9. Aortic atherosclerosis. 10. No evidence of bowel obstruction or perforation. Aortic Atherosclerosis (ICD10-I70.0). Electronically Signed   By: Lucienne Capers M.D.   On: 06/02/2020 02:45   IR GASTROSTOMY TUBE MOD SED  Result Date:  06/09/2020 Jacqulynn Cadet, MD     06/09/2020  3:41 PM Interventional Radiology Procedure Note Procedure: 1.) Tunneled  peritoneal drain 2.) G-tube placed Complications: None Estimated Blood Loss: None Recommendations: - Return to floor - G-tube to LWS x 3 hrs - Peritoneal drainage can begin tomorrow as well Signed, Criselda Peaches, MD   US Paracentesis  Result Date: 06/04/2020 INDICATION: Patient with history of metastatic pancreatic cancer, recurrent ascites. Request to IR for therapeutic paracentesis. EXAM: ULTRASOUND GUIDED THERAPEUTIC PARACENTESIS MEDICATIONS: 10 mL 1% lidocaine COMPLICATIONS: None immediate. PROCEDURE: Informed written consent was obtained from the patient after a discussion of the risks, benefits and alternatives to treatment. A timeout was performed prior to the initiation of the procedure. Initial ultrasound scanning demonstrates a large amount of ascites within the left lower abdominal quadrant. The left lower abdomen was prepped and draped in the usual sterile fashion. 1% lidocaine was used for local anesthesia. Following this, a 19 gauge, 7-cm, Yueh catheter was introduced. An ultrasound image was saved for documentation purposes. The paracentesis was performed. The catheter was removed and a dressing was applied. The patient tolerated the procedure well without immediate post procedural complication. FINDINGS: A total of approximately 3.4 L of clear yellow fluid was removed. IMPRESSION: Successful ultrasound-guided paracentesis yielding 3.4 liters of peritoneal fluid. Read by Candiss Norse, PA-C Electronically Signed   By: Corrie Mckusick D.O.   On: 06/04/2020 13:00   US Paracentesis  Result Date: 05/23/2020 INDICATION: Patient with history of metastatic pancreatic cancer, recurrent ascites. Request made for diagnostic and therapeutic paracentesis. EXAM: ULTRASOUND GUIDED DIAGNOSTIC AND THERAPEUTIC PARACENTESIS MEDICATIONS: 1% lidocaine to skin and subcutaneous tissue  COMPLICATIONS: None immediate. PROCEDURE: Informed written consent was obtained from the patient after a discussion of the risks, benefits and alternatives to treatment. A timeout was performed prior to the initiation of the procedure. Initial ultrasound scanning demonstrates a small to moderate amount of ascites within the left lower abdominal quadrant. The left lower abdomen was prepped and draped in the usual sterile fashion. 1% lidocaine was used for local anesthesia. Following this, a 19 gauge, 7-cm, Yueh catheter was introduced. An ultrasound image was saved for documentation purposes. The paracentesis was performed. The catheter was removed and a dressing was applied. The patient tolerated the procedure well without immediate post procedural complication. FINDINGS: A total of approximately 1.5 liters of amber/blood-tinged fluid was removed. Samples were sent to the laboratory as requested by the clinical team. IMPRESSION: Successful ultrasound-guided diagnostic and therapeutic paracentesis yielding 1.5 liters of peritoneal fluid. Read by: Rowe Raider, PA-C Electronically Signed   By: Jacqulynn Cadet M.D.   On: 05/23/2020 15:13   IR Perc Athena Masse Perit Cath Wo Port  Result Date: 06/09/2020 Jacqulynn Cadet, MD     06/09/2020  3:41 PM Interventional Radiology Procedure Note Procedure: 1.) Tunneled peritoneal drain 2.) G-tube placed Complications: None Estimated Blood Loss: None Recommendations: - Return to floor - G-tube to LWS x 3 hrs - Peritoneal drainage can begin tomorrow as well Signed, Criselda Peaches, MD   DG CHEST PORT 1 VIEW  Result Date: 06/06/2020 CLINICAL DATA:  Multifocal airspace opacity EXAM: PORTABLE CHEST 1 VIEW COMPARISON:  June 05, 2020 FINDINGS: Port-A-Cath tip is at the cavoatrial junction. No pneumothorax. There is extensive airspace opacity throughout the left lung, overall slightly increased. There is a small pleural effusion on each side. There is slight right base  atelectasis. Right lung otherwise is clear. Heart is mildly enlarged, stable, with pulmonary vascularity normal. No adenopathy. No bone lesions. IMPRESSION: Widespread airspace opacity throughout the left lung, overall slightly increased. Mild right base atelectasis. Small  pleural effusions bilaterally. Stable cardiac silhouette. Port-A-Cath tip at cavoatrial junction. Electronically Signed   By: Lowella Grip III M.D.   On: 06/06/2020 09:42   DG Chest Port 1 View  Result Date: 06/05/2020 CLINICAL DATA:  Respiratory failure. EXAM: PORTABLE CHEST 1 VIEW COMPARISON:  Chest radiograph 06/01/2020 and CT 06/02/2020 FINDINGS: A right jugular Port-A-Cath terminates over the lower SVC. The cardiomediastinal silhouette is unchanged with normal heart size. Widespread airspace opacity throughout the left lung has mildly improved from the prior radiograph. Right basilar airspace opacity is new from the prior radiograph although dense consolidation was present in this region on the interval CT. There may be persistent small bilateral pleural effusions. No pneumothorax is identified. IMPRESSION: Mild improvement of extensive left lung airspace disease with persistent milder disease in the right lung base which may reflect pneumonia. Electronically Signed   By: Logan Bores M.D.   On: 06/05/2020 05:57   DG CHEST PORT 1 VIEW  Result Date: 06/01/2020 CLINICAL DATA:  Shortness of breath status post EGD EXAM: PORTABLE CHEST 1 VIEW COMPARISON:  05/30/2020, CT 05/03/2020 FINDINGS: Right-sided central venous port tip over the SVC. The right lung is grossly clear. Interim development of fairly extensive consolidation and ground-glass opacity throughout the left thorax. No pleural effusion. Stable cardiomediastinal silhouette. No pneumothorax. IMPRESSION: Development of fairly extensive consolidation and ground-glass opacity throughout the left thorax, findings could be secondary to asymmetric edema, pulmonary hemorrhage, or  diffuse pneumonia. Correlation with CT could be considered. Electronically Signed   By: Donavan Foil M.D.   On: 06/01/2020 16:57   DG CHEST PORT 1 VIEW  Result Date: 05/30/2020 CLINICAL DATA:  Hypoxia. EXAM: PORTABLE CHEST 1 VIEW COMPARISON:  Prior chest radiographs 05/30/2020. Chest CT 05/03/2020. FINDINGS: Right chest infusion port catheter with tip projecting in the region of the superior cavoatrial junction. Heart size within normal limits. Mild ill-defined/linear opacities within the left lung base are more pronounced as compared to the chest radiographs performed earlier the same day and favored to reflect atelectasis. The right lung is clear. No evidence of pleural effusion or pneumothorax. Redemonstrated sclerosis within the anterior left fourth rib which is likely posttraumatic in etiology. IMPRESSION: Mild ill-defined/linear opacities within the left lung base are more pronounced as compared to the chest radiographs performed earlier the same day and favored to reflect atelectasis. Redemonstrated sclerosis within the anterior left fourth rib which is likely posttraumatic in etiology. Continued attention recommended on follow-up. Electronically Signed   By: Kellie Simmering DO   On: 05/30/2020 08:20   DG Abd 2 Views  Result Date: 05/17/2020 CLINICAL DATA:  Metastatic pancreatic cancer.  Nausea and vomiting. EXAM: X-RAY ABDOMEN 2 VIEWS COMPARISON:  02/10/2020 FINDINGS: Right upper quadrant stent, consistent with biliary ductal stent, as seen previously. Bowel gas pattern does not show evidence of ileus, obstruction or free air. No significant bone findings. IMPRESSION: Right upper quadrant stent. No evidence of ileus or obstruction. No free air. Electronically Signed   By: Nelson Chimes M.D.   On: 05/17/2020 12:20   DG ABD ACUTE 2+V W 1V CHEST  Result Date: 05/30/2020 CLINICAL DATA:  Nausea, metastatic pancreatic carcinoma EXAM: ACUTE ABDOMEN SERIES (2 VIEW ABDOMEN AND 1 VIEW CHEST) COMPARISON:   Radiograph 05/17/2020, CT 05/03/2020 FINDINGS: Right IJ approach Port-A-Cath tip terminates at the superior cavoatrial junction. Telemetry leads overlie the chest. Few streaky likely atelectatic opacities in the lung bases. No consolidation, features of edema, pneumothorax, or effusion. Pulmonary vascularity is normally distributed. The aorta  is calcified. The remaining cardiomediastinal contours are unremarkable. A biliary stent is seen in the upper abdomen. No high-grade obstructive bowel gas pattern. Moderate colonic stool burden. No subdiaphragmatic free air. No suspicious calcifications. Increased attenuation of the abdomen may reflect a degree of ascites. No acute or suspicious osseous lesions. Degenerative changes in the spine, shoulders, hips and pelvis. IMPRESSION: 1. Atelectatic changes in the lungs. 2. No high-grade obstructive bowel gas pattern.  No free air. 3. Moderate colonic stool burden. 4. Possible ascites. 5.  Aortic Atherosclerosis (ICD10-I70.0). Electronically Signed   By: Lovena Le M.D.   On: 05/30/2020 00:33   DG Abd Portable 1V  Result Date: 06/06/2020 CLINICAL DATA:  64 year old male with pancreatic cancer, gastric distension. EXAM: PORTABLE ABDOMEN - 1 VIEW COMPARISON:  Abdominal film 06/04/2019. CT Abdomen and Pelvis 06/02/2020. FINDINGS: Portable AP supine view at 0826 hours. There is moderate to severe gastric distention with air. There is abrupt termination of air at the level of the distal stomach or duodenal bulb. A metal biliary stent is redemonstrated projecting over the mid abdomen and distended stomach. There is a paucity of bowel gas elsewhere throughout the abdomen and pelvis. There is a small volume of gas in the normal rectosigmoid colon. No acute osseous abnormality identified. IMPRESSION: 1. 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 2. Decompressed small and large bowel. Electronically Signed   By: Genevie Ann  M.D.   On: 06/06/2020 08:44   DG Abd Portable 1V  Result Date: 06/03/2020 CLINICAL DATA:  Duodenal obstruction.  Evaluate stomach. EXAM: PORTABLE ABDOMEN - 1 VIEW COMPARISON:  05/31/2020 FINDINGS: Moderate gastric dilatation with progression from the prior study. No dilatation of the large and small bowel. Stent in the common bile duct again noted. Bibasilar airspace disease is extensive. IMPRESSION: Progressive gastric dilatation. Electronically Signed   By: Franchot Gallo M.D.   On: 06/03/2020 08:38   DG Loyce Dys Tube Plc W/Fl W/Rad  Result Date: 06/03/2020 CLINICAL DATA:  Attempted NG tube placement on the floor. EXAM: NASO G TUBE PLACEMENT WITH FL AND WITH RAD FLUOROSCOPY TIME:  Fluoroscopy Time:  18 seconds Radiation Exposure Index (if provided by the fluoroscopic device): 1.1 mGy Number of Acquired Spot Images: 0 COMPARISON:  None. FINDINGS: Three attempts were made after numbing of the nasal passages and lubricating a 10 French nasogastric tube. We were unable to advance the tube beyond the nasal cavity. It may be that endoscopic means may be necessary for placement. Assessment of previous maxillofacial CT shows very limited clearance between turbinates and nasal septum which may account for this difficulty. IMPRESSION: Unsuccessful nasogastric tube placement as described. Electronically Signed   By: Zetta Bills M.D.   On: 06/03/2020 16:39   DG UGI W SINGLE CM (SOL OR THIN BA)  Result Date: 05/31/2020 CLINICAL DATA:  Nausea vomiting, history of pancreatic neoplasm EXAM: WATER SOLUBLE UPPER GI SERIES TECHNIQUE: Single-column upper GI series was performed using water soluble contrast. CONTRAST:  149mL OMNIPAQUE IOHEXOL 300 MG/ML  SOLN COMPARISON:  CT abdomen and pelvis from 05/30/2020 FLUOROSCOPY TIME:  Fluoroscopy Time:  1 minutes 35 seconds Radiation Exposure Index (if provided by the fluoroscopic device): 16.6 mGy Number of Acquired Spot Images: 1 FINDINGS: Scout image demonstrates a biliary  stent in place over the RIGHT upper quadrant projecting to the RIGHT of the spine. With the head of the table slightly elevated swallowing of water-soluble contrast was performed in bilateral oblique positioning. The esophagus  was found to be patulous, limited assessment given the use of water-soluble contrast. Contrast flowed into a markedly distended stomach and was immediately diluted with the luminal contents. Gastric folds appear thickened for the degree of distension though not well evaluated on water-soluble assessment. Transition from esophagus to stomach was grossly normal aside from the esophageal dilation and the presence of reflux which was demonstrated from the stomach into the esophagus during the exam. The patient was then placed into the RIGHT lateral position and the RAO position. Only a small amount of contrast could be seen traversing the area of the stent and the area of the pancreatic and duodenal groove. Dilation of the duodenum proximal to this area was noted. Patient was LEFT in this position and progressive distension of the duodenal bulb and proximal duodenum, proximal to the stent was identified with small amounts of contrast passing intermittently across the site of presumed duodenal narrowing. The patient was then placed back into the supine position and gastric dilation and reflux again documented with some persistent contrast first and second portion of the duodenum leading up to the site of partial obstruction. IMPRESSION: 1. Moderate-to-marked partial duodenal obstruction at the level of the pancreatic tumor and stent as described. Only a small amount of contrast passed beyond this area during the examination in the RIGHT lateral position and RAO position. 2. This results in marked gastric distension which is exhibited on the current study and in gastroesophageal reflux. Electronically Signed   By: Zetta Bills M.D.   On: 05/31/2020 09:31   [place] EKG SITE RITE  Result Date:  06/07/2020 If Site Rite image not attached, placement could not be confirmed due to current cardiac rhythm.  IR Paracentesis  Result Date: 05/16/2020 INDICATION: Patient with a history of pancreatic cancer and ascites presents today for a therapeutic paracentesis. EXAM: ULTRASOUND GUIDED PARACENTESIS MEDICATIONS: 1% lidocaine 10 mL COMPLICATIONS: None immediate. PROCEDURE: Informed written consent was obtained from the patient after a discussion of the risks, benefits and alternatives to treatment. A timeout was performed prior to the initiation of the procedure. Initial ultrasound scanning demonstrates a large amount of ascites within the right lower abdominal quadrant. The right lower abdomen was prepped and draped in the usual sterile fashion. 1% lidocaine was used for local anesthesia. Following this, a 19 gauge, 7-cm, Yueh catheter was introduced. An ultrasound image was saved for documentation purposes. The paracentesis was performed. The catheter was removed and a dressing was applied. The patient tolerated the procedure well without immediate post procedural complication. FINDINGS: A total of approximately 1 L of clear yellow fluid was removed. IMPRESSION: Successful ultrasound-guided paracentesis yielding 1 liters of peritoneal fluid. Read by: Soyla Dryer, NP Electronically Signed   By: Corrie Mckusick D.O.   On: 05/16/2020 15:13       Discharge Exam: Vitals:   06/10/20 0900 06/10/20 1000  BP: (!) 89/77 (!) 106/93  Pulse: 95 86  Resp:    Temp:    SpO2: 95% 92%    General: Pt is alert, awake, not in acute distress  Cardiovascular: NSR, no edema Respiratory: no respiratory distress, no conversational dyspnea, on room air  Extremities: no edema, no cyanosis Psych: Normal mood and affect, stable judgement and insight     The results of significant diagnostics from this hospitalization (including imaging, microbiology, ancillary and laboratory) are listed below for reference.      Microbiology: Recent Results (from the past 240 hour(s))  Culture, sputum-assessment     Status: None  Collection Time: 06/01/20  3:34 PM   Specimen: Sputum  Result Value Ref Range Status   Specimen Description SPU  Final   Special Requests NONE  Final   Sputum evaluation   Final    THIS SPECIMEN IS ACCEPTABLE FOR SPUTUM CULTURE Performed at [facility], [place] [address], [place], [place] [address]    Report Status 06/01/2020 FINAL  Final  Culture, respiratory     Status: None   Collection Time: 06/01/20  3:34 PM   Specimen: Sputum  Result Value Ref Range Status   Specimen Description   Final    SPU Performed at [place] [address] [place], [place] [address]    Special Requests   Final    NONE Reflexed from [id number] Performed at [facility], [place] [address] [place], [place] [address]    Gram Stain   Final    FEW WBC PRESENT,BOTH PMN AND MONONUCLEAR RARE GRAM NEGATIVE RODS MODERATE GRAM POSITIVE COCCI RARE YEAST    Culture   Final    FEW Normal respiratory flora-no Staph aureus or Pseudomonas seen Performed at [facility], [address] [place], [place] [address]    Report Status 06/03/2020 FINAL  Final  Culture, blood (Routine X 2) w Reflex to ID Panel     Status: None   Collection Time: 06/01/20  4:38 PM   Specimen: BLOOD  Result Value Ref Range Status   Specimen Description   Final    BLOOD LEFT ANTECUBITAL Performed at [place] [address] [place], [place] [address]    Special Requests   Final    BOTTLES DRAWN AEROBIC ONLY Blood Culture adequate volume Performed at [place] [address], [place], [place] [address]    Culture   Final    NO GROWTH 5 DAYS Performed at [facility], [place] [address], [place], [place] [address]    Report Status 06/06/2020 FINAL  Final  Culture, blood (Routine X 2) w Reflex to ID Panel     Status:  None   Collection Time: 06/01/20  4:38 PM   Specimen: BLOOD LEFT HAND  Result Value Ref Range Status   Specimen Description   Final    BLOOD LEFT HAND Performed at [place] [address] [place], [place] [address]    Special Requests   Final    BOTTLES DRAWN AEROBIC ONLY Blood Culture adequate volume Performed at [place] [address], [place], [place] [address]    Culture   Final    NO GROWTH 5 DAYS Performed at [facility], [place] [address] [place], [place] [address]    Report Status 06/06/2020 FINAL  Final  MRSA PCR Screening     Status: None   Collection Time: 06/01/20  5:24 PM   Specimen: Nasal Mucosa; Nasopharyngeal  Result Value Ref Range Status   MRSA by PCR NEGATIVE NEGATIVE Final    Comment:        The GeneXpert MRSA Assay (FDA approved for NASAL specimens only), is one component of a comprehensive MRSA colonization surveillance program. It is not intended to diagnose MRSA infection nor to guide or monitor treatment for MRSA infections. Performed at [facility], [place] [address], [place], [place] [address]   Urine culture     Status: None   Collection Time: 06/02/20 12:23 AM   Specimen: Urine  Result Value  Ref Range Status   Specimen Description   Final    URINE, CATHETERIZED Performed at [place] [address] [place], [place] [address]    Special Requests   Final    NONE Performed at [facility], [place] [address] [place], [place] [address]    Culture   Final    NO GROWTH Performed at [facility], [place] [address] [place], [place] [address]    Report Status 06/03/2020 FINAL  Final     Labs: BNP (last 3 results) No results for input(s): BNP in the last 8760 hours. Basic Metabolic Panel: Recent Labs  Lab 06/06/20 0245 06/07/20 0417 06/08/20 0404 06/09/20 0416 06/10/20 0520  NA 134* 136 135 137 134*  K 3.3* 4.0 4.0 3.6  3.6  CL 102 103 103 106 102  CO2 23 23 24 24 24   GLUCOSE 101* 127* 215* 223* 123*  BUN 12 14 20 22 19   CREATININE 0.45* 0.40* 0.41* 0.40* 0.34*  CALCIUM 7.5* 8.3* 8.2* 8.4* 8.0*  MG 1.8 1.9 2.0 1.9 1.7  PHOS 2.2* 3.0 3.5 3.3 3.9   Liver Function Tests: Recent Labs  Lab 06/05/20 0423 06/06/20 0245 06/07/20 0417 06/08/20 0404 06/09/20 0416  AST 39 35 34 30 31  ALT 23 22 21 22 22   ALKPHOS 212* 215* 242* 288* 250*  BILITOT 1.9* 1.6* 2.3* 1.7* 1.0  PROT 5.1* 5.2* 5.3* 5.4* 5.2*  ALBUMIN 2.4* 2.3* 3.1* 2.8* 2.7*   No results for input(s): LIPASE, AMYLASE in the last 168 hours. No results for input(s): AMMONIA in the last 168 hours. CBC: Recent Labs  Lab 06/06/20 0245 06/07/20 0417 06/08/20 0404 06/09/20 0416 06/10/20 0520  WBC 8.3 2.9* 2.9* 4.1 6.2  NEUTROABS 6.2 2.0 2.2 3.1 4.7  HGB 11.3* 9.5* 10.4* 10.5* 11.0*  HCT 31.9* 27.7* 31.0* 30.0* 32.1*  MCV 97.3 98.2 98.7 99.0 98.5  PLT 115* 83* 99* 106* 107*   Cardiac Enzymes: No results for input(s): CKTOTAL, CKMB, CKMBINDEX, TROPONINI in the last 168 hours. BNP: Invalid input(s): POCBNP CBG: Recent Labs  Lab 06/09/20 1549 06/09/20 1938 06/10/20 0106 06/10/20 0519 06/10/20 0836  GLUCAP 266* 175* 126* 123* 130*   D-Dimer No results for input(s): DDIMER in the last 72 hours. Hgb A1c No results for input(s): HGBA1C in the last 72 hours. Lipid Profile Recent Labs    06/08/20 0404  TRIG 126   Thyroid function studies No results for input(s): TSH, T4TOTAL, T3FREE, THYROIDAB in the last 72 hours.  Invalid input(s): FREET3 Anemia work up No results for input(s): VITAMINB12, FOLATE, FERRITIN, TIBC, IRON, RETICCTPCT in the last 72 hours. Urinalysis    Component Value Date/Time   COLORURINE YELLOW 06/02/2020 0023   APPEARANCEUR CLEAR 06/02/2020 0023   LABSPEC 1.014 06/02/2020 0023   PHURINE 6.0 06/02/2020 0023   GLUCOSEU NEGATIVE 06/02/2020 0023   HGBUR NEGATIVE 06/02/2020 0023   BILIRUBINUR NEGATIVE  06/02/2020 0023   KETONESUR 5 (A) 06/02/2020 0023   PROTEINUR NEGATIVE 06/02/2020 0023   NITRITE NEGATIVE 06/02/2020 0023   LEUKOCYTESUR NEGATIVE 06/02/2020 0023   Sepsis Labs Invalid input(s): PROCALCITONIN,  WBC,  LACTICIDVEN Microbiology Recent Results (from the past 240 hour(s))  Culture, sputum-assessment     Status: None   Collection Time: 06/01/20  3:34 PM   Specimen: Sputum  Result Value Ref Range Status   Specimen Description SPU  Final   Special Requests NONE  Final   Sputum evaluation   Final  THIS SPECIMEN IS ACCEPTABLE FOR SPUTUM CULTURE Performed at [facility], [place] [address], [place], [place] [address]    Report Status 06/01/2020 FINAL  Final  Culture, respiratory     Status: None   Collection Time: 06/01/20  3:34 PM   Specimen: Sputum  Result Value Ref Range Status   Specimen Description   Final    SPU Performed at [place] [address], [place], [place] [address]    Special Requests   Final    NONE Reflexed from [id number] Performed at [facility], [place] [address], [place], [place] [address]    Gram Stain   Final    FEW WBC PRESENT,BOTH PMN AND MONONUCLEAR RARE GRAM NEGATIVE RODS MODERATE GRAM POSITIVE COCCI RARE YEAST    Culture   Final    FEW Normal respiratory flora-no Staph aureus or Pseudomonas seen Performed at [facility], [address], [place], [place] [address]    Report Status 06/03/2020 FINAL  Final  Culture, blood (Routine X 2) w Reflex to ID Panel     Status: None   Collection Time: 06/01/20  4:38 PM   Specimen: BLOOD  Result Value Ref Range Status   Specimen Description   Final    BLOOD LEFT ANTECUBITAL Performed at [place] [address], [place], [place] [address]    Special Requests   Final    BOTTLES DRAWN AEROBIC ONLY Blood Culture adequate volume Performed at [place] [address], [place], [place]  [address]    Culture   Final    NO GROWTH 5 DAYS Performed at [facility], [place] [address], [place], [place] [address]    Report Status 06/06/2020 FINAL  Final  Culture, blood (Routine X 2) w Reflex to ID Panel     Status: None   Collection Time: 06/01/20  4:38 PM   Specimen: BLOOD LEFT HAND  Result Value Ref Range Status   Specimen Description   Final    BLOOD LEFT HAND Performed at [place] [address] [place], [place] [address]    Special Requests   Final    BOTTLES DRAWN AEROBIC ONLY Blood Culture adequate volume Performed at [place] [address] [place], [place] [address]    Culture   Final    NO GROWTH 5 DAYS Performed at [facility], [place] [address] [place], [place] [address]    Report Status 06/06/2020 FINAL  Final  MRSA PCR Screening     Status: None   Collection Time: 06/01/20  5:24 PM   Specimen: Nasal Mucosa; Nasopharyngeal  Result Value Ref Range Status   MRSA by PCR NEGATIVE NEGATIVE Final    Comment:        The GeneXpert MRSA Assay (FDA approved for NASAL specimens only), is one component of a comprehensive MRSA colonization surveillance program. It is not intended to diagnose MRSA infection nor to guide or monitor treatment for MRSA infections. Performed at [facility], [place] [address] [place], [place] [address]   Urine culture     Status: None   Collection Time: 06/02/20 12:23 AM   Specimen: Urine  Result Value Ref Range Status   Specimen Description   Final    URINE, CATHETERIZED Performed at [place] [address] [place], [place] [address]    Special Requests   Final    NONE  Performed at [facility], [place] [address] [place], [place] [address]    Culture   Final    NO GROWTH Performed at [facility], [place] [address] [place],  [address]    Report Status 06/03/2020 FINAL  Final     Patient was  seen and examined on the day of discharge and was found to be in stable condition. Time coordinating discharge: 45 minutes including assessment and coordination of care, as well as examination of the patient.   SIGNED:  Dessa Phi, DO [facility] 06/10/2020, 10:51 AM

## 2020-06-10 NOTE — Progress Notes (Signed)
950 mL drained from PleurX catheter. Education for draining PleurX catheter provided to patient and patient's wife. Sent home with supplies to drain catheter.

## 2020-06-10 NOTE — Progress Notes (Signed)
NUTRITION NOTE  Consult received for home TF regimen. G-tube placed by IR yesterday at around 1530. No TF provided yet. Patient is discharging home with home hospice today.  Able to talk with patient and his wife, who is at bedside. Patient seen by this RD for full initial assessment on 10/5.  K, Mg and Phos are currently normal. Patient remains at refeeding risk with initiation of TF d/t prolonged period without adequate nutrition PTA. He was experiencing N/V with nearly all intakes PTA as well so concern for this with TF start.   Home TF regimen:  [name] 1.4 @ 10 ml/hr to advance by 10 ml every 24 hours to reach goal rate of 68 ml/hr with 45 ml Prosource TF (or equivalent) TID and 100 ml free water QID.   At goal rate, this regimen will provide 1625 ml formula, 2395 kcal and 133 grams protein.   Estimated Nutritional Needs:  Kcal:  2400-2675 kcal Protein:  120-135 grams Fluid:  >/= 1.5 L/day    Jeffrey Matin, MS, RD, LDN, CNSC Inpatient Clinical Dietitian RD pager # available in [facility]  After hours/weekend pager # available in [facility]

## 2020-06-10 NOTE — TOC Transition Note (Signed)
Transition of Care [facility]) - CM/SW Discharge Note   Patient Details  Name: Jeffrey Harris MRN: [id number] Date of Birth: 19-Mar-1956  Transition of Care [facility]) CM/SW Contact:  Leeroy Cha, RN Phone Number: 06/10/2020, 11:42 AM   Clinical Narrative:    Tube feeding pump ordered therough adapt, hhc through [facility] and home hospice. Spoke with Bevely Palmer robertson,equipment being delivered today, review feeding program.   Final next level of care: Home w Hospice Care Barriers to Discharge: Barriers Resolved   Patient Goals and CMS Choice Patient states their goals for this hospitalization and ongoing recovery are:: unable to state CMS Medicare.gov Compare Post Acute Care list provided to:: Patient    Discharge Placement                       Discharge Plan and Services   Discharge Planning Services: CM Consult            DME Arranged: Tube feeding, Tube feeding pump   Date DME Agency Contacted: 06/10/20 Time DME Agency Contacted: 1000 Representative spoke with at DME Agency: zack HH Arranged: RN, NIV [place] Agency: [place] (Rafael Hernandez) Date [place]: 06/07/20 Time [place]: 1128 Representative spoke with at [place]: [place] ([place]) Interventions     Readmission Risk Interventions No flowsheet data found.

## 2020-06-10 NOTE — Discharge Instructions (Signed)
Anda [employer] 1.4 @ 10 ml/hr to advance by 10 ml every 24 hours to reach goal rate of 68 ml/hr with 45 ml Prosource TF (or equivalent) three times daily and 100 ml free water four times daily.

## 2020-06-13 ENCOUNTER — Ambulatory Visit: Payer: BC Managed Care – PPO

## 2020-06-14 ENCOUNTER — Ambulatory Visit: Payer: BC Managed Care – PPO

## 2020-06-15 ENCOUNTER — Ambulatory Visit: Payer: BC Managed Care – PPO

## 2020-06-15 ENCOUNTER — Other Ambulatory Visit: Payer: BC Managed Care – PPO

## 2020-06-16 ENCOUNTER — Ambulatory Visit: Payer: BC Managed Care – PPO

## 2020-06-17 ENCOUNTER — Ambulatory Visit: Payer: BC Managed Care – PPO

## 2020-06-24 ENCOUNTER — Telehealth: Payer: Self-pay

## 2020-06-24 NOTE — Telephone Encounter (Signed)
Per obituary, pt expired on 07-07-20. No notification from Hospice. [place] sent per Dr Marin Olp. dph

## 2020-07-04 DEATH — deceased

## 2020-08-14 NOTE — Progress Notes (Signed)
  Radiation Oncology         [phone] ________________________________  Name: Jeffrey Harris MRN: [id number]  Date: 06/09/2020  DOB: 1956-06-27  End of Treatment Note  Diagnosis:   Pancreatic cancer     Indication for treatment::  palliative       Radiation treatment dates:   06/06/20 - 06/09/20  Site/dose:   The pancreas was planned to receive 30 Gy in 10 fractions using a 3-field 3D conformal technique.  Narrative: The patient tolerated radiation but the patient decided to discontinue treatment after 12 Gy in 4 fractions due to his overall status and goals.  Plan: The patient has completed radiation treatment. The patient will return to radiation oncology clinic for routine followup in one month. I advised the patient to call or return sooner if they have any questions or concerns related to their recovery or treatment. ________________________________  Jodelle Gross, M.D., Ph.D.

## 2022-03-30 IMAGING — CT CT CHEST W/ CM
2 of 5 series · 11 of 36 positions shown, 13 images · IV contrast (omnipaque)
Comparison: Multiple exams, including 12/11/2019 10/06/2019

CLINICAL DATA: Restaging of pancreatic cancer. Current
chemotherapy. Assessment for response. Difficulty voiding, Foley
catheter placed on 12/11/2019.

EXAM:
CT CHEST, ABDOMEN, AND PELVIS WITH CONTRAST
TECHNIQUE: Multidetector CT imaging of the chest, abdomen and pelvis was
performed following the standard protocol during bolus
administration of intravenous contrast.
CONTRAST:  100mL OMNIPAQUE IOHEXOL 300 MG/ML  SOLN

[Series 2: axials cap 5.00 · axial · 0.77mm/px · z∈[-1530,-955]mm · 8 of 145 slices shown, 10 images]
[im 15/145  mediastinal]
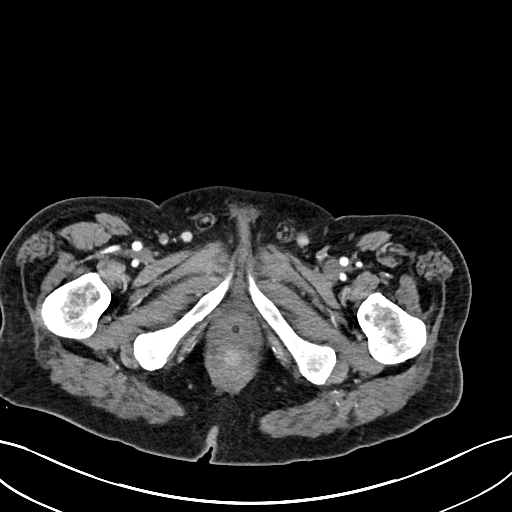
[im 15/145  lung]
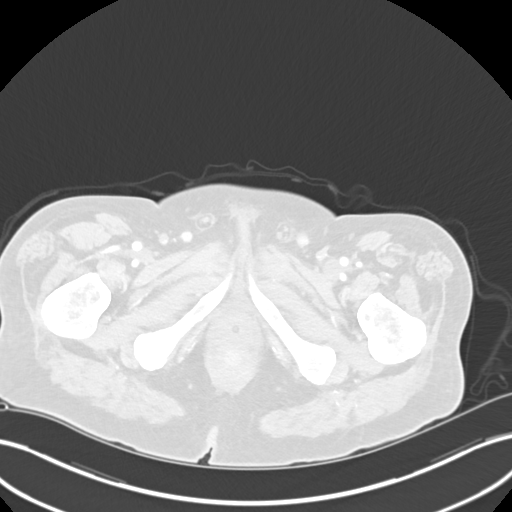
[im 29/145  lung]
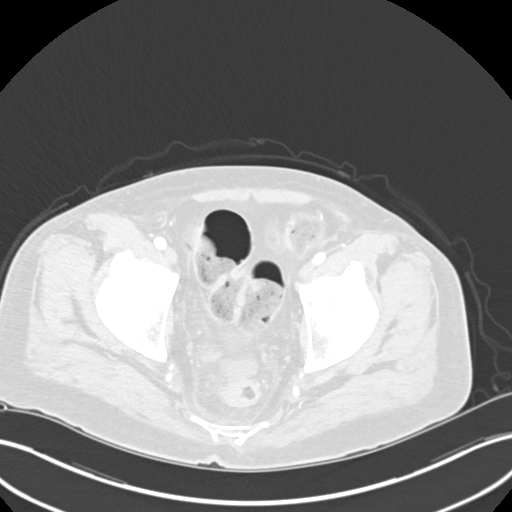
[im 44/145  lung]
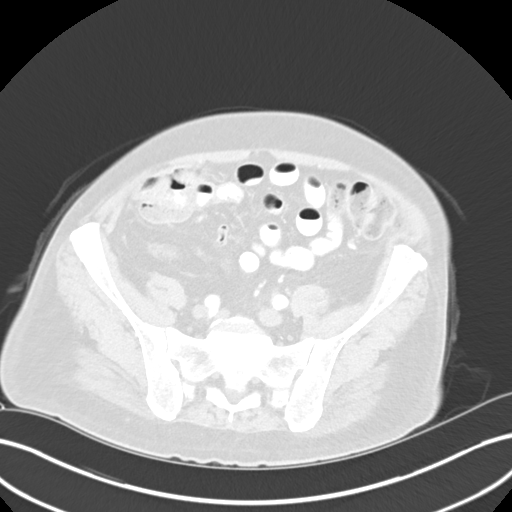
[im 58/145  lung]
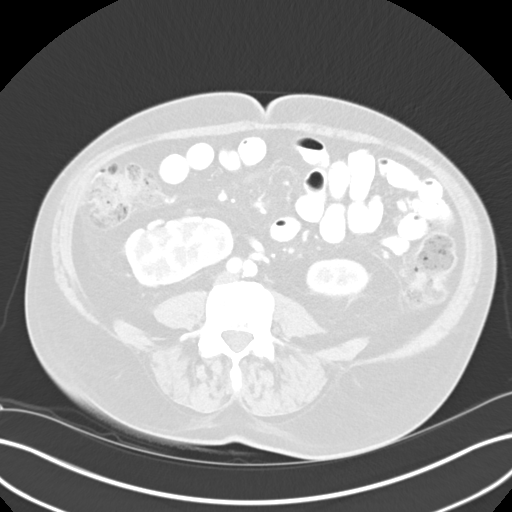
[im 87/145  mediastinal]
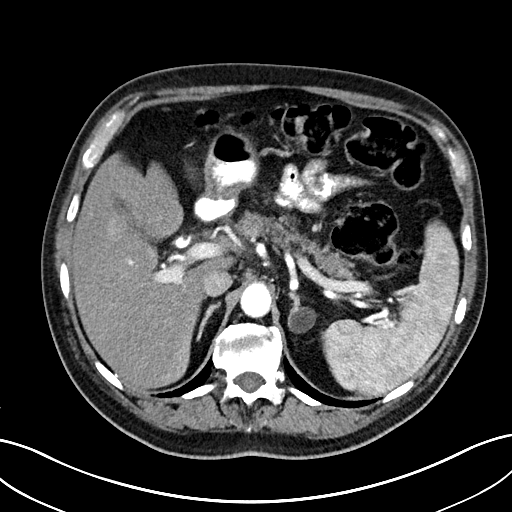
[im 87/145  lung]
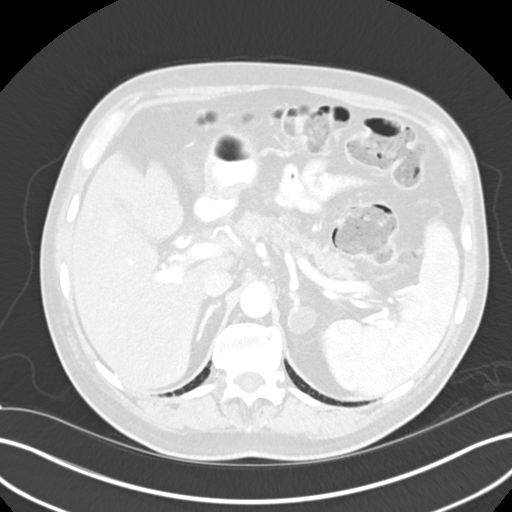
[im 101/145  lung]
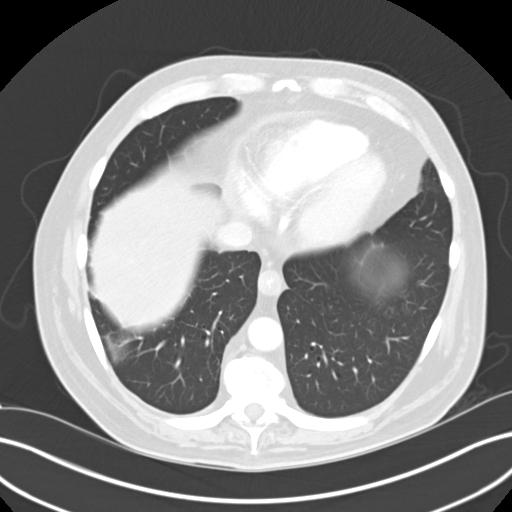
[im 116/145  lung]
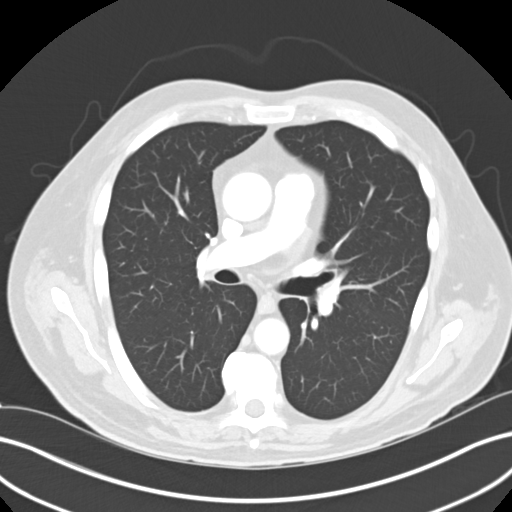
[im 130/145  lung]
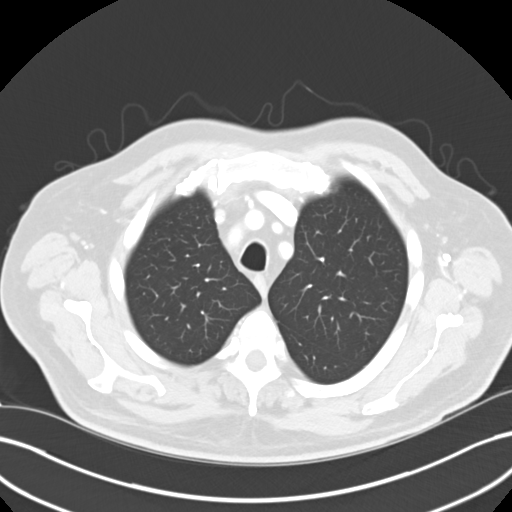

[Series 4: coronals cap 2.00 cor · coronal · 0.77mm/px · 3 of 165 slices shown]
[im 33/165  lung]
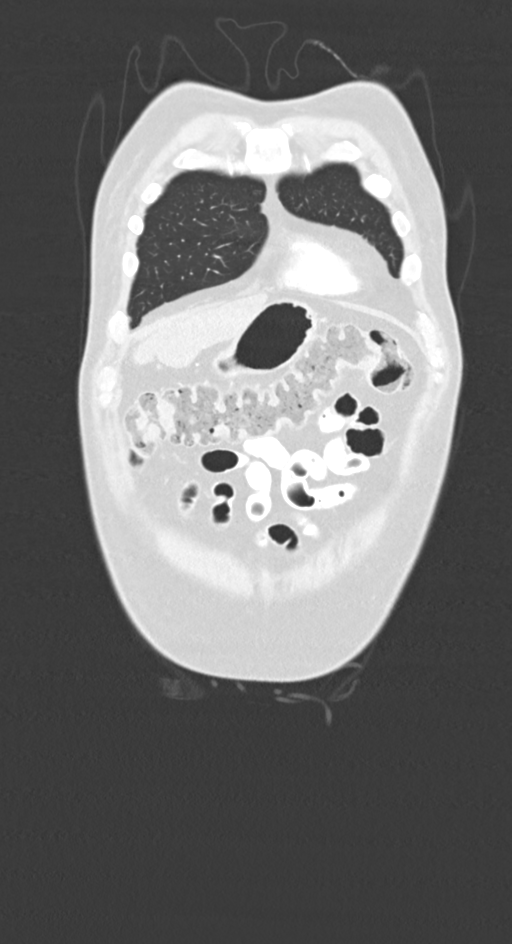
[im 66/165  lung]
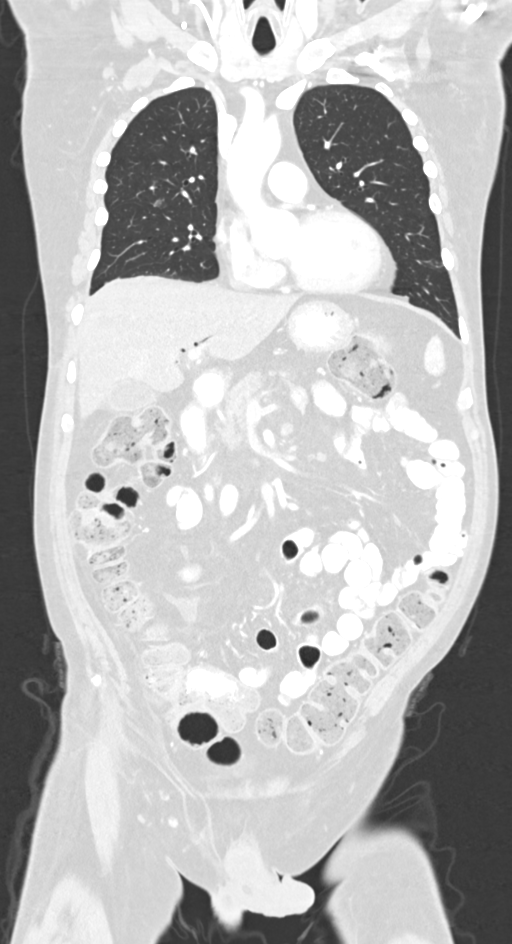
[im 99/165  lung]
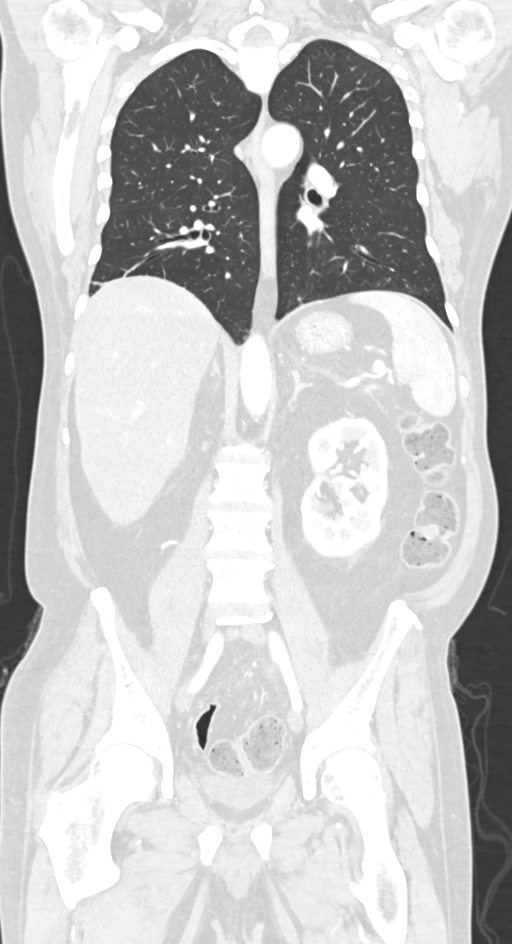

[11 of 36 positions shown; findings below may reference images not displayed]

FINDINGS: CT CHEST FINDINGS

Cardiovascular: Right Port-A-Cath tip: SVC.

Mild atherosclerotic calcification of the aortic arch and left
anterior descending coronary artery.

Mediastinum/Nodes: Stable thyroid goiter. Not clinically
significant; no follow-up imaging recommended (ref: [HOSPITAL]. [DATE]): 143-50).

Distal esophageal circumferential wall thickening similar to prior.
Common cause for this appearance would be esophagitis.

Lungs/Pleura: Mild scarring in the right lower lobe and right middle
lobe. Several small ill-defined ground-glass density not areas in
the right upper lobe and right middle lobe are present including an
ill-defined 0.9 by 0.6 cm right upper lobe density on image 68/3,
probably from low-grade incidental alveolitis.

Musculoskeletal: Thoracic spondylosis.

CT ABDOMEN PELVIS FINDINGS

Hepatobiliary: Pneumobilia. Expandable distal biliary stent is in
place and appears well position. Density dependently in the
gallbladder likely represents retrograde flow of contrast medium.

There is suspected diffuse hepatic steatosis with focal sparing
along the gallbladder fossa. Further reduction in conspicuity of the
previous hepatic lesions posteriorly in the right hepatic lobe
although some of this might be attributable to increasing hepatic
steatosis further matching the density of the previous lesions. The
segment 4a lesion measures 0.7 cm long axis on image 52/2 which is
stable, and the segment 4b lesion is somewhat more indistinct than
previous but measures about 0.6 cm in short axis which is stable.
New or enlarging lesion is identified in the liver.

Pancreas: No dorsal pancreatic duct dilatation. The pancreatic head
mass has always been indistinct on CT, and today's exam is no
exception, with a mass inferiorly in the pancreatic head difficult
to confidently identify or measure. Certainly there is no changing
contour pancreatic head to indicate any enlargement or progression.
When measured in a manner similar to the 10/06/2019 exam, the
pancreatic head itself measures 2.5 by 2.1 cm on image 68/2,
unchanged.

Spleen: Unremarkable

Adrenals/Urinary Tract: Stable adrenal adenomas. The lateral limb
left adrenal gland may actually represent at bilobed lipoma given
the internal fatty content. Right adrenal gland unremarkable.

Non rotated right kidney with a flattened contour along the renal
hilum. Complex multipartite right renal collecting system, with
unusual renal venous drainage including 2 right renal veins, 2 left
renal veins, and a third right renal vein draining into the lower
pole left renal vein which itself extends in a retroaortic manner.

Foley catheter in the otherwise empty urinary bladder.

Stomach/Bowel: Prominent stool throughout the colon favors
constipation.

Vascular/Lymphatic: Portacaval node 1.6 cm in short axis on image
64/2, previously same by my measurements. No new adenopathy

Reproductive: Unremarkable

Other: Trace free fluid anterior to the rectum for example on image
97/6.

Musculoskeletal: Unremarkable
IMPRESSION: 1. Pancreatic head mass remains highly indistinct and not readily
measurable. No change in size/contour the pancreatic head compared
to the 10/06/2019 exam. Stable mild porta hepatis adenopathy. The
two small hypodense residua from prior left hepatic lobe metastatic
disease are stable, the tiny posterior right hepatic lobe lesions
are no longer readily seen. No new or enlarging liver lesion
identified.
2. Distal esophageal circumferential wall thickening similar to
prior, query esophagitis.
3. Several small ill-defined ground-glass density not areas in the
right upper lobe and right middle lobe, probably from low-grade
incidental alveolitis. Consider surveillance on subsequent chest CT.
4. Other imaging findings of potential clinical significance: Stable
thyroid goiter. Pneumobilia. Prominent stool throughout the colon
favors constipation. Trace free fluid anterior to the rectum,
etiology uncertain. Stable adrenal adenomas. Complex multipartite
right renal collecting system, with unusual renal venous drainage
including two right renal veins, two left renal veins, and a third
right renal vein draining into the lower pole left renal vein which
itself extends in a retroaortic manner. Non rotated right kidney.
Aortic atherosclerosis.

Aortic Atherosclerosis ([id number].M).

## 2022-03-30 IMAGING — CT CT ABD-PELV W/ CM
2 of 5 series · 12 of 46 positions shown, 14 images · IV contrast (omnipaque)
Comparison: Multiple exams, including 12/11/2019 10/06/2019

CLINICAL DATA: Restaging of pancreatic cancer. Current
chemotherapy. Assessment for response. Difficulty voiding, Foley
catheter placed on 12/11/2019.

EXAM:
CT CHEST, ABDOMEN, AND PELVIS WITH CONTRAST
TECHNIQUE: Multidetector CT imaging of the chest, abdomen and pelvis was
performed following the standard protocol during bolus
administration of intravenous contrast.
CONTRAST:  100mL OMNIPAQUE IOHEXOL 300 MG/ML  SOLN

[Series 2: axials cap 5.00 · axial · 0.77mm/px · z∈[-1535,-950]mm · 9 of 145 slices shown, 11 images]
[im 14/145  soft-tissue]
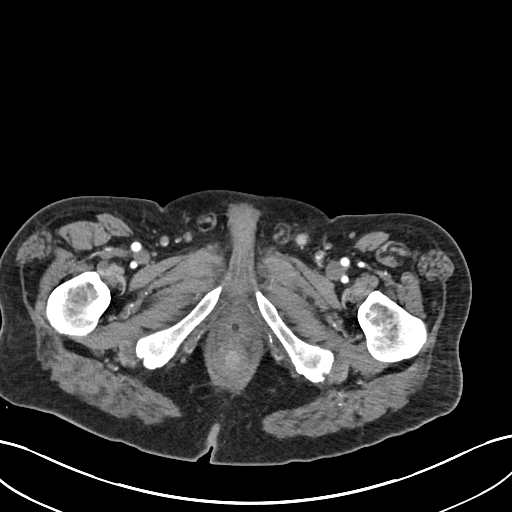
[im 14/145  bone]
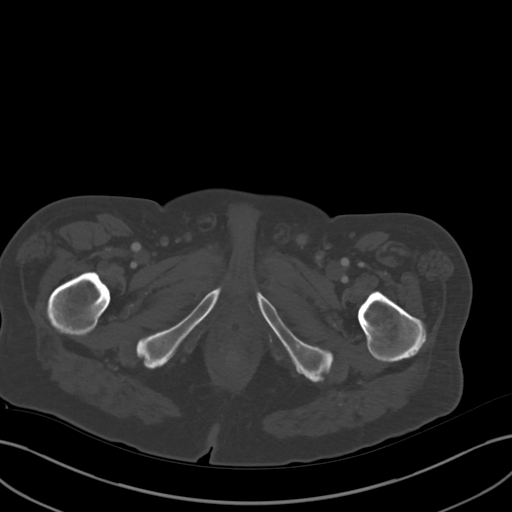
[im 27/145  soft-tissue]
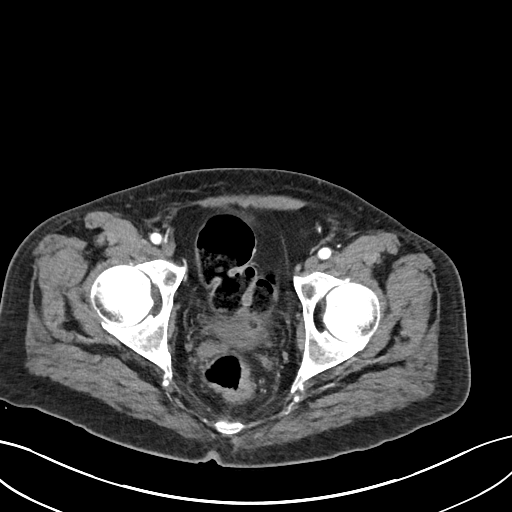
[im 40/145  soft-tissue]
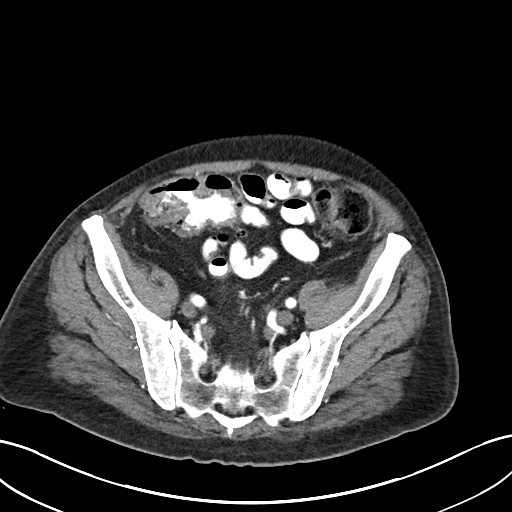
[im 53/145  soft-tissue]
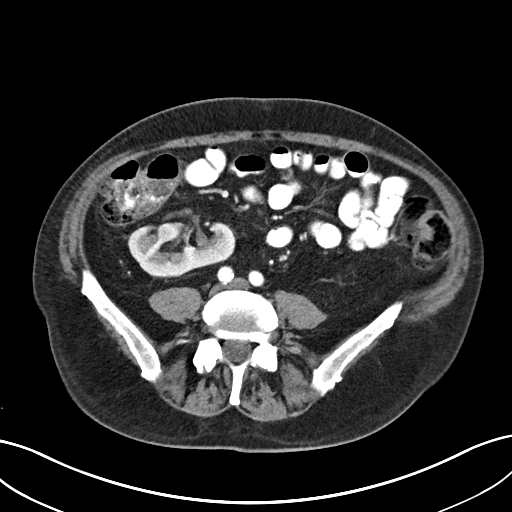
[im 79/145  soft-tissue]
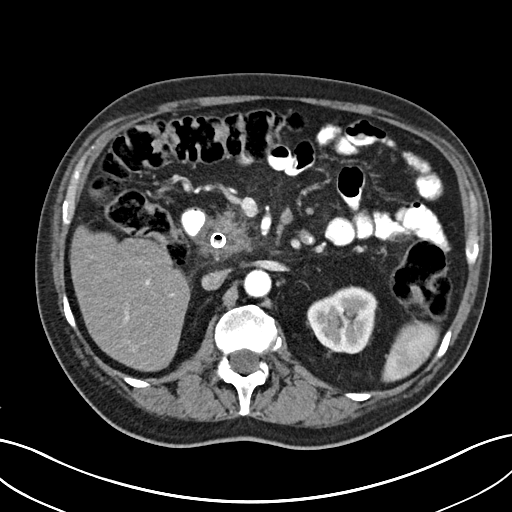
[im 92/145  soft-tissue]
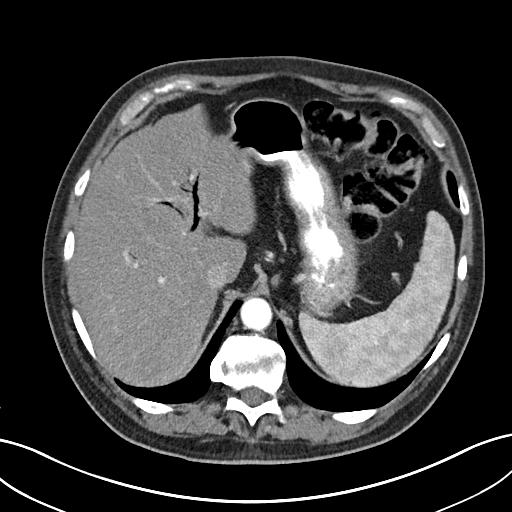
[im 105/145  soft-tissue]
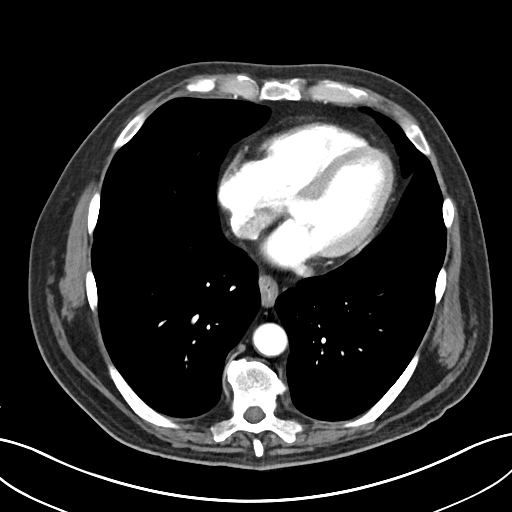
[im 118/145  soft-tissue]
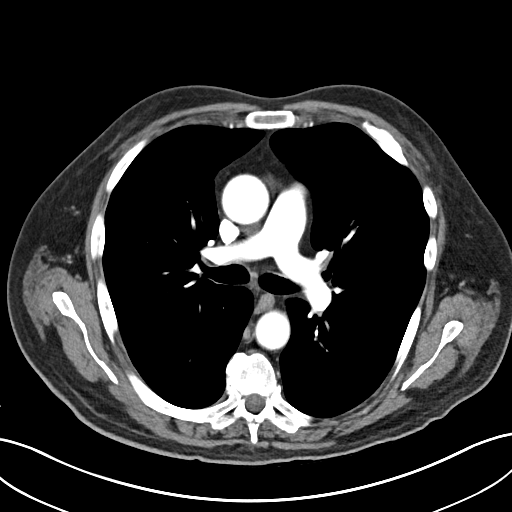
[im 131/145  soft-tissue]
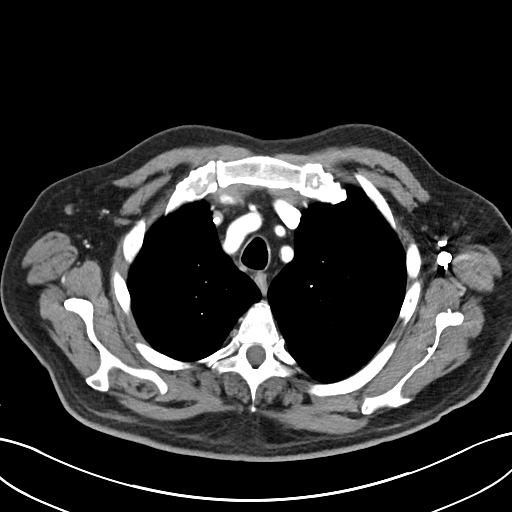
[im 131/145  bone]
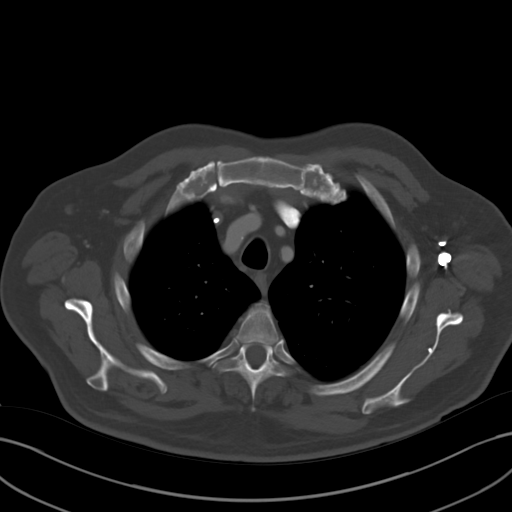

[Series 4: coronals cap 2.00 cor · coronal · 0.77mm/px · 3 of 165 slices shown]
[im 55/165  soft-tissue]
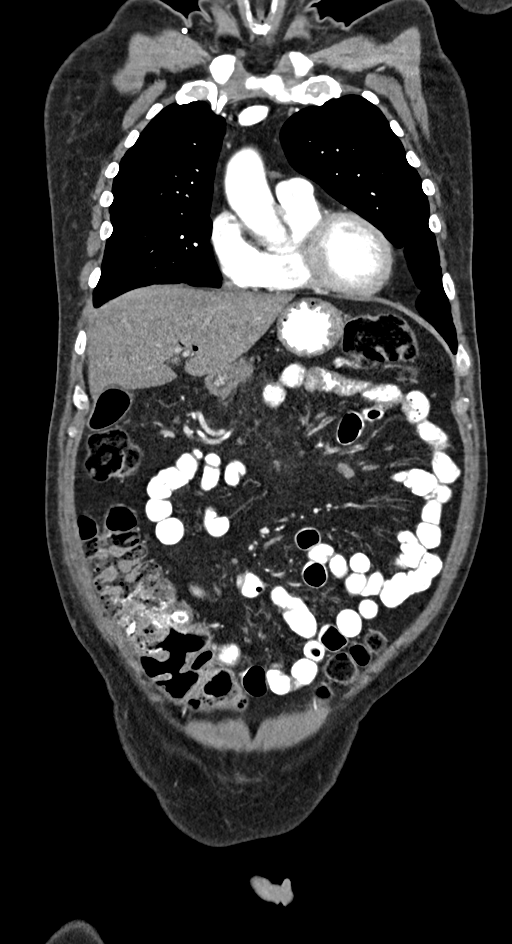
[im 73/165  soft-tissue]
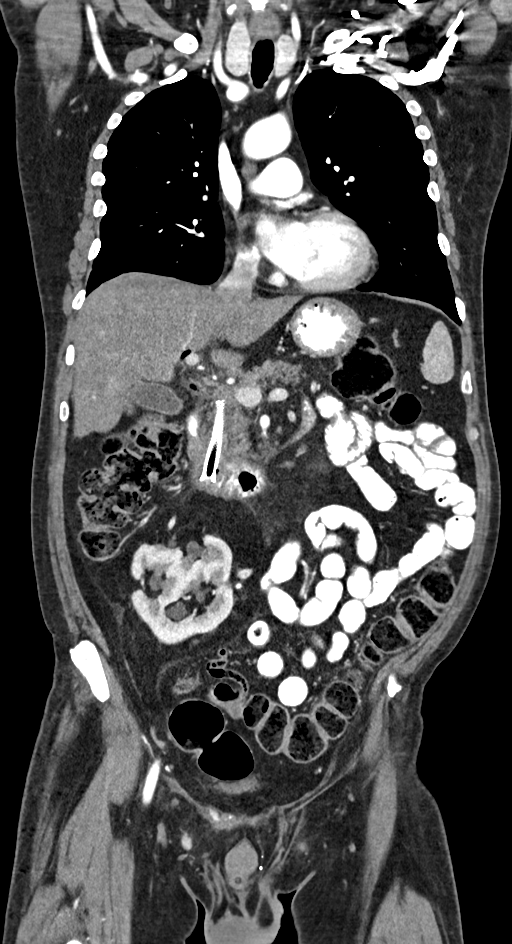
[im 92/165  soft-tissue]
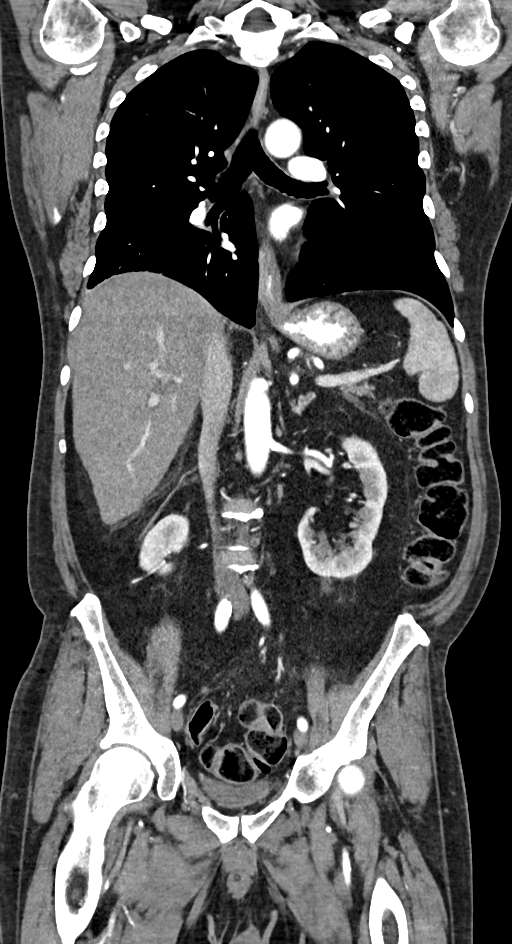

[12 of 46 positions shown; findings below may reference images not displayed]

FINDINGS: CT CHEST FINDINGS

Cardiovascular: Right Port-A-Cath tip: SVC.

Mild atherosclerotic calcification of the aortic arch and left
anterior descending coronary artery.

Mediastinum/Nodes: Stable thyroid goiter. Not clinically
significant; no follow-up imaging recommended (ref: [HOSPITAL]. [DATE]): 143-50).

Distal esophageal circumferential wall thickening similar to prior.
Common cause for this appearance would be esophagitis.

Lungs/Pleura: Mild scarring in the right lower lobe and right middle
lobe. Several small ill-defined ground-glass density not areas in
the right upper lobe and right middle lobe are present including an
ill-defined 0.9 by 0.6 cm right upper lobe density on image 68/3,
probably from low-grade incidental alveolitis.

Musculoskeletal: Thoracic spondylosis.

CT ABDOMEN PELVIS FINDINGS

Hepatobiliary: Pneumobilia. Expandable distal biliary stent is in
place and appears well position. Density dependently in the
gallbladder likely represents retrograde flow of contrast medium.

There is suspected diffuse hepatic steatosis with focal sparing
along the gallbladder fossa. Further reduction in conspicuity of the
previous hepatic lesions posteriorly in the right hepatic lobe
although some of this might be attributable to increasing hepatic
steatosis further matching the density of the previous lesions. The
segment 4a lesion measures 0.7 cm long axis on image 52/2 which is
stable, and the segment 4b lesion is somewhat more indistinct than
previous but measures about 0.6 cm in short axis which is stable.
New or enlarging lesion is identified in the liver.

Pancreas: No dorsal pancreatic duct dilatation. The pancreatic head
mass has always been indistinct on CT, and today's exam is no
exception, with a mass inferiorly in the pancreatic head difficult
to confidently identify or measure. Certainly there is no changing
contour pancreatic head to indicate any enlargement or progression.
When measured in a manner similar to the 10/06/2019 exam, the
pancreatic head itself measures 2.5 by 2.1 cm on image 68/2,
unchanged.

Spleen: Unremarkable

Adrenals/Urinary Tract: Stable adrenal adenomas. The lateral limb
left adrenal gland may actually represent at bilobed lipoma given
the internal fatty content. Right adrenal gland unremarkable.

Non rotated right kidney with a flattened contour along the renal
hilum. Complex multipartite right renal collecting system, with
unusual renal venous drainage including 2 right renal veins, 2 left
renal veins, and a third right renal vein draining into the lower
pole left renal vein which itself extends in a retroaortic manner.

Foley catheter in the otherwise empty urinary bladder.

Stomach/Bowel: Prominent stool throughout the colon favors
constipation.

Vascular/Lymphatic: Portacaval node 1.6 cm in short axis on image
64/2, previously same by my measurements. No new adenopathy

Reproductive: Unremarkable

Other: Trace free fluid anterior to the rectum for example on image
97/6.

Musculoskeletal: Unremarkable
IMPRESSION: 1. Pancreatic head mass remains highly indistinct and not readily
measurable. No change in size/contour the pancreatic head compared
to the 10/06/2019 exam. Stable mild porta hepatis adenopathy. The
two small hypodense residua from prior left hepatic lobe metastatic
disease are stable, the tiny posterior right hepatic lobe lesions
are no longer readily seen. No new or enlarging liver lesion
identified.
2. Distal esophageal circumferential wall thickening similar to
prior, query esophagitis.
3. Several small ill-defined ground-glass density not areas in the
right upper lobe and right middle lobe, probably from low-grade
incidental alveolitis. Consider surveillance on subsequent chest CT.
4. Other imaging findings of potential clinical significance: Stable
thyroid goiter. Pneumobilia. Prominent stool throughout the colon
favors constipation. Trace free fluid anterior to the rectum,
etiology uncertain. Stable adrenal adenomas. Complex multipartite
right renal collecting system, with unusual renal venous drainage
including two right renal veins, two left renal veins, and a third
right renal vein draining into the lower pole left renal vein which
itself extends in a retroaortic manner. Non rotated right kidney.
Aortic atherosclerosis.

Aortic Atherosclerosis ([id number].M).
